# Patient Record
Sex: Female | Born: 1940 | Race: Black or African American | Hispanic: No | Smoking: Never smoker
Health system: Southern US, Community
[De-identification: ages and names within clinical notes are randomized; demographics above are authoritative.]

## PROBLEM LIST (undated history)

## (undated) DIAGNOSIS — I10 Essential (primary) hypertension: Secondary | ICD-10-CM

## (undated) DIAGNOSIS — Z950 Presence of cardiac pacemaker: Secondary | ICD-10-CM

## (undated) DIAGNOSIS — J40 Bronchitis, not specified as acute or chronic: Secondary | ICD-10-CM

## (undated) DIAGNOSIS — Z45018 Encounter for adjustment and management of other part of cardiac pacemaker: Secondary | ICD-10-CM

## (undated) DIAGNOSIS — I442 Atrioventricular block, complete: Secondary | ICD-10-CM

## (undated) HISTORY — PX: OTHER SURGICAL HISTORY: SHX169

---
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
## 1999-02-14 ENCOUNTER — Other Ambulatory Visit: Admission: RE | Admit: 1999-02-14 | Discharge: 1999-02-14 | Payer: Self-pay | Admitting: *Deleted

## 2000-04-30 ENCOUNTER — Other Ambulatory Visit: Admission: RE | Admit: 2000-04-30 | Discharge: 2000-04-30 | Payer: Self-pay | Admitting: Internal Medicine

## 2000-08-21 ENCOUNTER — Other Ambulatory Visit: Admission: RE | Admit: 2000-08-21 | Discharge: 2000-08-21 | Payer: Self-pay | Admitting: Obstetrics and Gynecology

## 2001-11-09 ENCOUNTER — Emergency Department (HOSPITAL_COMMUNITY): Admission: EM | Admit: 2001-11-09 | Discharge: 2001-11-10 | Payer: Self-pay | Admitting: Emergency Medicine

## 2002-05-04 ENCOUNTER — Encounter: Admission: RE | Admit: 2002-05-04 | Discharge: 2002-05-04 | Payer: Self-pay | Admitting: Internal Medicine

## 2002-05-04 ENCOUNTER — Encounter: Payer: Self-pay | Admitting: Internal Medicine

## 2003-06-27 ENCOUNTER — Encounter: Payer: Self-pay | Admitting: Emergency Medicine

## 2003-06-27 ENCOUNTER — Emergency Department (HOSPITAL_COMMUNITY): Admission: EM | Admit: 2003-06-27 | Discharge: 2003-06-27 | Payer: Self-pay | Admitting: Emergency Medicine

## 2004-05-02 ENCOUNTER — Encounter: Admission: RE | Admit: 2004-05-02 | Discharge: 2004-05-02 | Payer: Self-pay | Admitting: Internal Medicine

## 2005-02-14 ENCOUNTER — Encounter: Admission: RE | Admit: 2005-02-14 | Discharge: 2005-03-08 | Payer: Self-pay | Admitting: Internal Medicine

## 2006-04-11 ENCOUNTER — Emergency Department (HOSPITAL_COMMUNITY): Admission: EM | Admit: 2006-04-11 | Discharge: 2006-04-11 | Payer: Self-pay | Admitting: Emergency Medicine

## 2006-05-09 ENCOUNTER — Inpatient Hospital Stay (HOSPITAL_COMMUNITY): Admission: EM | Admit: 2006-05-09 | Discharge: 2006-05-12 | Payer: Self-pay | Admitting: Emergency Medicine

## 2006-05-09 ENCOUNTER — Ambulatory Visit: Payer: Self-pay | Admitting: Pulmonary Disease

## 2006-05-12 ENCOUNTER — Encounter: Payer: Self-pay | Admitting: Vascular Surgery

## 2006-05-23 ENCOUNTER — Ambulatory Visit: Payer: Self-pay | Admitting: Pulmonary Disease

## 2006-05-29 ENCOUNTER — Ambulatory Visit (HOSPITAL_BASED_OUTPATIENT_CLINIC_OR_DEPARTMENT_OTHER): Admission: RE | Admit: 2006-05-29 | Discharge: 2006-05-29 | Payer: Self-pay | Admitting: Pulmonary Disease

## 2006-06-18 ENCOUNTER — Ambulatory Visit (HOSPITAL_COMMUNITY): Admission: RE | Admit: 2006-06-18 | Discharge: 2006-06-18 | Payer: Self-pay | Admitting: *Deleted

## 2006-06-19 ENCOUNTER — Ambulatory Visit: Payer: Self-pay | Admitting: Pulmonary Disease

## 2006-08-18 ENCOUNTER — Emergency Department (HOSPITAL_COMMUNITY): Admission: EM | Admit: 2006-08-18 | Discharge: 2006-08-18 | Payer: Self-pay | Admitting: Pediatrics

## 2009-07-11 ENCOUNTER — Emergency Department (HOSPITAL_COMMUNITY): Admission: EM | Admit: 2009-07-11 | Discharge: 2009-07-12 | Payer: Self-pay | Admitting: Emergency Medicine

## 2010-08-29 ENCOUNTER — Encounter: Admission: RE | Admit: 2010-08-29 | Discharge: 2010-08-29 | Payer: Self-pay | Admitting: Internal Medicine

## 2010-09-07 ENCOUNTER — Encounter: Admission: RE | Admit: 2010-09-07 | Discharge: 2010-09-07 | Payer: Self-pay | Admitting: Internal Medicine

## 2011-02-08 LAB — BASIC METABOLIC PANEL
BUN: 14 mg/dL (ref 6–23)
CO2: 25 mEq/L (ref 19–32)
Chloride: 109 mEq/L (ref 96–112)
Creatinine, Ser: 0.71 mg/dL (ref 0.4–1.2)
GFR calc Af Amer: >60 mL/min (ref >60)
Potassium: 3.4 mEq/L — ABNORMAL LOW (ref 3.5–5.1)
Sodium: 142 mEq/L (ref 135–145)

## 2011-02-08 LAB — CBC
HCT: 37.9 % (ref 36.0–46.0)
Hemoglobin: 12.7 g/dL (ref 12.0–15.0)
MCHC: 33.6 g/dL (ref 30.0–36.0)
WBC: 14.9 10*3/uL — ABNORMAL HIGH (ref 4.0–10.5)

## 2011-02-08 LAB — HEPATIC FUNCTION PANEL
ALT: 10 U/L (ref 0–35)
AST: 16 U/L (ref 0–37)
Bilirubin, Direct: <0.1 mg/dL (ref 0.0–0.3)
Total Bilirubin: 0.4 mg/dL (ref 0.3–1.2)
Total Protein: 7.5 g/dL (ref 6.0–8.3)

## 2011-02-08 LAB — URINALYSIS, ROUTINE W REFLEX MICROSCOPIC
Glucose, UA: NEGATIVE mg/dL
Nitrite: POSITIVE — AB
Specific Gravity, Urine: 1.028 (ref 1.005–1.030)
pH: 5.5 (ref 5.0–8.0)

## 2011-02-08 LAB — DIFFERENTIAL
Monocytes Absolute: 0.5 10*3/uL (ref 0.1–1.0)
Monocytes Relative: 3 % (ref 3–12)
Neutro Abs: 13.4 10*3/uL — ABNORMAL HIGH (ref 1.7–7.7)

## 2011-02-08 LAB — LIPASE, BLOOD: Lipase: 18 U/L (ref 11–59)

## 2011-02-08 LAB — URINE MICROSCOPIC-ADD ON

## 2011-03-22 NOTE — Procedures (Signed)
Holly [name], JARRATT                ACCOUNT NO.:  [id number]   MEDICAL RECORD NO.:  [id number]          PATIENT TYPE:  OUT   LOCATION:  SLEEP CENTER                 FACILITY:  [facility]   PHYSICIAN:  Chesley Mires, M.D.      DATE OF BIRTH:  04-07-1941   DATE OF STUDY:                              NOCTURNAL POLYSOMNOGRAM   REFERRING PHYSICIAN:  Dr. Chesley Mires.   INDICATIONS FOR STUDY:  This is an individual who had a recent  hospitalization and  was found to have snoring and symptoms of daytime  sleepiness.  She is referred to the sleep lab for evaluation of obstructive  sleep apnea.   MEDICATIONS:  Avapro, triamterene, hydrochlorothiazide, Aciphex, amlodipine,  Delsym, and Mucinex.   EPWORTH SCORE:  20.   SLEEP ARCHITECTURE:  Sleep architecture: total recording time was 508.5 minutes, total sleep time  was 396 minutes.  Sleep efficiency was 78%.  The patient was observed in all  stages of sleep.  However, there was a significant reduction in the  percentage of slow wave sleep to 3% of study and a reduction in percentage  of REM sleep to 18% of study.  The patient was observed in both the supine  and non-supine positions.  Sleep latency was 13 minutes which is within  normal limits.  REM latency was 130.5 minutes which is within normal limits.   RESPIRATORY DATA:  This is a split night protocol.  The average respiratory  rate was 18.  During the diagnostic portion of the study the apnea/hypopnea  index was 33.9 and loud snoring was noted by the technician.  The events  were  exclusively obstructive in nature. Of note is that there was a  significant REM effect. During the therapeutic portion of study, the patient  was titrated from a C-PAP pressure reading of 6-18 cm of water.  The patient  did not sleep in the supine position during the entire  therapeutic portion  study. At a C-PAP pressure setting of 13 cm of water.  The apnea/hypopnea  index was reduced to zero. Sleep architecture  was stabilized and the patient  was observed in REM and snoring was eliminated.   OXYGEN DATA:  The baseline oxygenation was 94%. The oxygen saturation nadir  during the diagnostic portion of the study was 70%. During the therapeutic  portion of the study at a C-PAP pressure setting of 13 cm of water, the mean  oxygenation during non-REM was 94% and the mean oxygenation during REM was  92%.   CARDIAC DATA:  The average heart rate was 72 and EKG showed normal sinus  rhythm.   MOVEMENT/PARASOMNIA:  The periodic limb movement index was 11.1.  The  patient was noted to have occasional leg cramps but no other abnormal  behaviors.   IMPRESSION:  This study shows evidence for severe obstructive sleep apnea as  demonstrated by apnea/hypopnea  index of 33.9, an oxygen saturation nadir of  70%. During the therapeutic portion of the study, the patient was titrated  to a CPAP pressure of 13 cm of water and at this pressure setting the  apnea/hypopnea index was reduced  to zero and sleep architecture was  stabilized as well as oxygenation.  Additionally, the patient was observed  in REM sleep at this pressure setting.  However, she did not sleep in the  supine position at this pressure setting.  The patient was fitted with a  Respironics comfort full face mask.      Chesley Mires, M.D.  Diplomat, Tax adviser of Sleep Medicine  Electronically Signed     VS/MEDQ  D:  06/02/2006 12:25:42  T:  06/02/2006 13:39:03  Job:  [id number]

## 2011-03-22 NOTE — H&P (Signed)
Holly [name], Holly [name]                ACCOUNT NO.:  [id number]   MEDICAL RECORD NO.:  [id number]          PATIENT TYPE:  INP   LOCATION:  4708                         FACILITY:  [facility]   PHYSICIAN:  Sherryl Manges, M.D.  DATE OF BIRTH:  Jan 08, 1941   DATE OF ADMISSION:  05/09/2006  DATE OF DISCHARGE:                                HISTORY & PHYSICAL   PRIMARY CARE PHYSICIAN:  [name].   CHIEF COMPLAINT:  Increasing shortness of breath for 1 week. Cough  productive of yellow/greenish phlegm.   HISTORY OF PRESENT ILLNESS:  This is a 70 year old female. For past medical  history, see below. According to patient, who is quite a good historian,  over the past 1 week she has had increasing shortness of breath and cough  productive of initially clear phlegm, but now somewhat yellowish/greenish.  Denies fever. Denies chest pain. She had an appointment with her primary  care physician on May 05, 2006 and came to the emergency room instead, at  [facility], because of above symptoms. She was treated with  bronchodilator nebulizers and subsequently discharged home on antibiotics.  The patient does not remember which.  However after transient improvement, symptoms became much worse and patient  came back to the emergency department on May 09, 2006.   PAST MEDICAL HISTORY:  1.  Bronchial asthma.  2.  Hypertension.  3.  Osteoarthritis.  4.  GERD.   MEDICATIONS:  1.  Amlodipine 10 mg p.o. daily.  2.  Avapro 300 mg p.o. daily.  3.  Triamterene/HCTZ 37.5/25 one p.o. daily.  4.  Aciphex 20 mg p.o. b.i.d.  5.  Albuterol MDI p.r.n.  6.  Albuterol/Atrovent nebulizers p.r.n.   ALLERGIES:  No known drug allergies, however patient is reportedly  intolerant of all other proton pump inhibitors.   REVIEW OF SYSTEMS:  Essentially as in the HPI and chief complaint, otherwise  negative.   SOCIAL HISTORY:  The patient is divorced for about 20 years now. Has 7  offspring who are alive  and well. Has one brother and one sister both alive  and well. Mother died at age 27 years status post CVA.  Father's health  history is unknown.  The patient is a nonsmoker, non drinker, no history of  drug abuse.   PHYSICAL EXAMINATION:  VITAL SIGNS:  Temperature 99.1, pulse 90 per minute,  regular. Respiratory rate 24. Blood pressure 140/78 mmHg. Pulse oximetry 95%  on room air.  GENERAL:  The patient appears quite comfortable, communicative, not short of  breath at rest, talking in complete sentences.  HEENT:  No clinical pallor, no jaundice, no conjunctival injection.  NECK:  Supple, JVP not seen.  RESPIRATORY:  Accessory muscles not in use.  CHEST:  Bilateral expiratory wheeze, no crackles.  CARDIAC:  Heart sounds were heard, normal, regular, no murmurs.  ABDOMEN:  Morbidly obese. Soft and nontender, no hepatosplenomegaly, no  masses. Normal bowel sounds.  LOWER EXTREMITY EXAMINATION:  No pitting edema. Palpable peripheral pulses.  MUSCULOSKELETAL:  Examination appears quite unremarkable.  CENTRAL NERVOUS SYSTEM:  No focal neurologic deficits, gross  examination.   INVESTIGATIONS:  Hemoglobin 13.6, hematocrit 40.0. Electrolytes:  Sodium  140, potassium 3.7, chloride 105, CO2 26.9, BUN 21, creatinine 1.1, glucose  112.  Troponin I at point of care less than 0.05.  Chest x-ray dated May 09, 2006  is a low volume film but shows chronic interstitial coarsening without focal  infiltrate or edema.  EKG dated May 09, 2006 shows sinus rhythm, regular, 95  per minute. Normal axis. No acute ischemic changes.   ASSESSMENT:  1.  Infective exacerbation of bronchial asthma.  Plan to admit the patient,      administer bronchodilator nebulizers, oxygen supplementation, steroids      and cover with Avelox.   1.  Hypertension.  This appears uncontrolled. Patient is on multiple      antihypertensive medications. We shall reinstate these, monitor and      adjust medications as indicated.   1.   GERD.  The patient is on twice daily proton pump inhibitor, which we      will continue. Reportedly patient is intolerant of all PPIs except      Aciphex. Therefore, we shall continue this for now.   1.  History of osteoarthritis. Utilize p.r.n. analgesics.   Further management will depend on clinical course.   NOTE:  The patient's daughter is an R. N. and has insisted that she would  like some pulmonary involvement in this patient's case. According to her,  patient has had increasingly frequent exacerbation of her bronchial asthma.  I have assured that we will consult pulmonary accordingly, on May 10, 2006.      Sherryl Manges, M.D.  Electronically Signed     CO/MEDQ  D:  05/09/2006  T:  05/09/2006  Job:  [id number]   cc:   Merrilee Seashore, M.D.  Fax: [phone]

## 2011-03-22 NOTE — Assessment & Plan Note (Signed)
[place]                               PULMONARY OFFICE NOTE   NAME:Holly [name], Holly [name]                       MRN:          [id number]  DATE:05/23/2006                            DOB:          [birth year]    I saw Ms. Minotti in followup today for her cough, post nasal drip, reflux,  vocal cord dysfunction, obesity, reactive airway disease with acute  bronchitis and possible sleep apnea.  With regards to her cough she said  this is improved significantly and she is only using Mucinex and Delsym on  an as needed basis but has not had the need to use this for the last several  days.  With regards to her post nasal drip, she is only using her nasal  steroid inhaler on an as needed basis and again has not had to use this  recently.  With regards to her reflux, she is due to have a followup  evaluation with Dr. Lajoyce Corners on July 30th.  She is currently on AcipHex 20 mg  b.i.d.  With regards to vocal cord dysfunction, with control of the above,  this is improved considerably and then with regards to her acute bronchitis  with possible reactive airway disease, she is not using her albuterol  inhaler.  She is off of prednisone and has not noticed any worsening of her  symptoms.  With regards to her obesity and sleep apnea, her symptoms are  still quite the same with regards to snoring at night as well as feeling  tired during the day.   CURRENT MEDICATIONS:  1.  Avapro 300 mg q. day.  2.  Triamterene/hydrochlorothiazide 37.5/25  1 pill q. day.  3.  AcipHex 20 mg b.i.d.  4.  Amlodipine 10 mg q. day.  5.  Delsym p.r.n.  6.  Mucinex p.r.n.   PHYSICAL EXAMINATION:  VITAL SIGNS:  She is 228 pounds.  Temperature is  98.7.  Blood pressure 140/80.  Heart rate is 98.  Oxygen saturation is 95%  on room air.  HEENT:  There are no sinus tenderness, no nasal discharge.  She had a  Mallampati 4 airway with elongated above the uvula and 2+ tonsils.  No  lymphadenopathy.  No  thyromegaly.  HEART:  S1, S2 with a regular rate and rhythm.  CHEST:  Clear to auscultation.  ABDOMEN:  Obese, soft, nontender.  EXTREMITIES:  No edema/clubbing.  NEUROLOGIC:  She is alert and oriented x3.  5/5 strength.  No cerebellar  deficits were appreciated.   IMPRESSION:  1.  Chronic cough.  This seems to have improved considerably. At this point      I just recommend as needed treatment for cough suppression.  2.  Post nasal drip.  This seems to have improved considerably as well.  She      could use nasal irrigation and steroid nasal inhalers on as needed      basis.  3.  Reflux.  She is due to have followup with Dr. Lajoyce Corners later this month.  4.  Symptoms suggestive of sleep apnea.  At  this point I feel that it would      be warranted for her to undergo an overnight polysomnogram to further      assess this - particularly in light of the fact that she has difficult      to control hypertension.  Therefore we will make arrangements for her to      have an overnight polysomnogram with a split night protocol and then I      will followup with her after reviewing the sleep study.  5.  Obesity.  I again discussed with her the importance of diet, exercise      and weight reduction.  6.  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  With the improvement in the above this      seems to have improved as well.  7.  Acute bronchitis with possible reactive airway disease.  She did not      seem to have any difficulty with regards to this at the present time.      Therefore I have advised her to use her albuterol on an as needed basis      but did not feel that she would need any additional forms of therapy for      this at this time.                                   Chesley Mires, MD   VS/MedQ  DD:  05/23/2006  DT:  05/23/2006  Job #:  [id number]   cc:   Waverly Ferrari, MD  Merrilee Seashore, MD

## 2011-03-22 NOTE — Op Note (Signed)
[name], [name]                ACCOUNT NO.:  [id number]   MEDICAL RECORD NO.:  [id number]          PATIENT TYPE:  AMB   LOCATION:  ENDO                         FACILITY:  [place]   PHYSICIAN:  Waverly Ferrari, M.D.    DATE OF BIRTH:  12-05-1940   DATE OF PROCEDURE:  06/18/2006  DATE OF DISCHARGE:                                 OPERATIVE REPORT   PROCEDURE:  Colonoscopy.   ENDOSCOPIST:  Waverly Ferrari, M.D.   INDICATIONS:  Colon cancer screening.   ANESTHESIA:  Demerol 20 mg, Versed.   PROCEDURE:  With the patient mildly sedated in the left lateral decubitus  position, the Olympus videoscopic colonoscope was inserted into the rectum  and passed under direct vision to the cecum, identified by ileocecal valve  and appendiceal orifice, both of which were photographed.  From this point,  the colonoscope was slowly withdrawn, taking circumferential views of the  colonic mucosa, stopping in the rectum, which appeared normal on direct and  showed hemorrhoids on retroflexed view.  The endoscope was straightened and  withdrawn.  The patient's vital signs and pulse oximetry remained stable.  The patient tolerated procedure well without apparent complications.   FINDINGS:  Internal hemorrhoids, otherwise an unremarkable colonoscopic  examination to the cecum.   PLAN:  Consider repeat examination in 5-10 years           ______________________________  Waverly Ferrari, M.D.     GMO/MEDQ  D:  06/18/2006  T:  06/18/2006  Job:  [id number]

## 2011-03-22 NOTE — Consult Note (Signed)
Holly [name], TRIMNAL               ACCOUNT NO.:  [id number]   MEDICAL RECORD NO.:  [id number]            PATIENT TYPE:   LOCATION:                                 FACILITY:   PHYSICIAN:  Chesley Mires, M.D.      DATE OF BIRTH:  [birth year]   DATE OF CONSULTATION:  05/11/2006  DATE OF DISCHARGE:                                   CONSULTATION   PULMONARY CONSULTATION   REASON FOR CONSULTATION:  Evaluation of wheezing.  Patient is a 70 year old  female who was admitted on July6 with an apparent exacerbation of her  asthma.  She said she was diagnosed with asthma approximately 3 years ago.  What had happened was around that time, she had lost approximately 50  pounds; and then subsequently regained the weight.  After this she started  developing her symptoms.  Initially she started having symptoms of acid  reflux; and then later developed symptoms of a globus sensation in her  throat associated with throat irritation and coughing.  She would also get  occasional wheezing, which she said was more in her upper throat; and she  did not really have any symptoms as far as chest tightness or dyspnea  associated wheezing.  She does state; however, that she remembers having a  cold approximately 2-3 years ago; and after that she would get symptoms of  wheezing, coughing with sputum production associated with upper airway  infections.  Otherwise in between she does not have these symptoms.  She  says that she also has symptoms of nasal congestion with postnasal drip; and  this causes some irritation in her throat as well.  She says that if she is  exposed to any kind of the irritants, such as car exhaust, cigarette smoke,  or someone mowing the lawn that these tends to bring out her symptoms of the  nasal congestion; and she also gets eye irritation occasionally as well with  this.  She has also been told that she snores at night; and her son has told  her that he has actually seen her stop  breathing while she is asleep.  She  complains of symptoms of feeling sleepy during the day; and has said that  she has actually had instances where she has almost fallen asleep while  driving.  She will also oftentimes eat late in the night prior to going to  sleep.  She says with this most recent event she had developed symptoms of  coughing, wheezing, and sputum production which was yellowish in color.  Since her hospitalization she has improved.  She says that when she uses her  inhalers, that these help occasionally, but oftentimes do not provide any  relief at all.   PAST MEDICAL HISTORY:  Otherwise is significant for asthma, hypertension,  arthritis and reflux.   ALLERGIES:  She has no known drug allergies.   OUTPATIENT MEDICATIONS:  1.  Norvasc.  2.  Avapro.  3.  Maxzide.  4.  AcipHex.  5.  Albuterol.  6.  DuoNeb.   CURRENT INPATIENT MEDICATIONS:  1.  Advair.  2.  Atrovent.  3.  Avapro.  4.  Lovenox.  5.  Maxzide.  6.  Mucinex.  7.  AcipHex.  8.  Norvasc.  9.  Rocephin.  10. Singulair.  11. Solu-Medrol.  12. Xopenex.  13. Zithromax.   SOCIAL HISTORY:  The patient is divorced.  She has 7 children.  She does not  have any personal history of tobacco use, but she did have secondhand  tobacco exposure.  There is no history of significant alcohol use.   FAMILY HISTORY:  Her mother died from a stroke.   REVIEW OF SYSTEMS:  Essentially negative except for as stated above.   PHYSICAL EXAM:  GENERAL:  She is seen in her hospital room sitting at her  bedside.  She is awake and alert, does not appear to be in acute distress.  VITAL SIGNS:  Her blood pressure 96/60, heart rate 71, respiratory rate 20,  temperature 98.1.  oxygen saturation 96 on room air.  She is 6 feet 2 inches  tall, 235 pounds.  HEENT AND NECK:  Pupils reactive.  Extraocular muscles are intact.  There is  no sinus tenderness.  She has clear nasal discharge with prominent nasal  turbinates.  She has  a Mallampati IV airway with oropharyngeal crowding and  elongated uvula.  There is no lymphadenopathy no  thyromegaly.  She did have  stridor heard over her upper airway which improved with pursed-lip  breathing.  HEART:  With S1-S2 no murmur.  CHEST:  She had audible wheezing, which, again, improved with purse-lipped  breathing.  There are no rales; and she had good air entry bilaterally.  ABDOMEN:  Obese, soft, nontender.  Positive bowel sounds.  EXTREMITIES:  There was no edema, cyanosis, or clubbing.  NEUROLOGIC EXAM:  She is alert and oriented x3, 5/5 strength, and no  cerebellar deficits were appreciated.   LABORATORY VALUES:  WBC is 7.1, hemoglobin 12, hematocrit 35.8, platelet  count is 296.  Sodium 141, potassium 4.5, chloride 107 CO2 26, BUN 24,  creatinine 1, glucose 151, calcium is 10.1.  Chest x-ray from 05/09/2006 was  essentially normal.  EKG from 05/09/2006 showed a normal sinus rhythm.   IMPRESSION:  She has symptoms of chronic cyclic cough, postnasal drip,  reflux, possible reactive airway disease with an acute bronchitis, obesity,  vocal cord dysfunction and symptoms suggestive of sleep apnea.   PLAN:  1.  With regards to the postnasal drip, I would continue her on her nasal      steroid regimen as well as needed nasal saline sprays and nasal      irrigation.  2.  Cyclic cough.  She could be tried on a course of cough suppressant such      as delsym  for 4-7 days.  Additionally, I have advised her to try using      sugarless candy to maintain the moisture in her mouth.  3.  Symptoms of reflux.  I would continue her on her proton pump inhibitor      regimen.  I have discussed with her the importance of dietary      modification; as well as having the head of her bed elevated while she      is asleep at night.  4.  Symptoms suggestive of sleep apnea.  This may be contributing to her     reflux symptoms, particularly at night as well.  For this she would need      to  have further evaluation, on outpatient basis with the overnight sleep      study.  5.  Obesity.  I have advised her about the importance of diet, exercise and      weight reduction, and it appears that many of her symptoms coincide with      an increase in her weight.  6.  Vocal cord dysfunction which is likely being caused by numbers 1, 2, 3,      and 4; and as these are improved, hopefully her symptoms of vocal cord      dysfunction will improve as well.  7.  Reactive airway disease with an acute bronchitis.  She seems to be      better symptomatically.  I would discontinue her Advair as this may, in      fact, be contributing to some of her symptoms of airway irritation.  I      would also wean her off of the Solu-Medrol as she does not appear to be      having bronchus spasm, but rather her wheezing is mostly related to her      upper airway.  I will also change her to oral antibiotics; and change      her inhaler regimen so that she would get this on an as needed basis;      and then I would make arrangements for her to follow-up with me in the      office within about a week or so after she is discharged from the      hospital.      Chesley Mires, M.D.  Electronically Signed     VS/MEDQ  D:  05/11/2006  T:  05/11/2006  Job:  [id number]

## 2011-03-22 NOTE — Discharge Summary (Signed)
Holly [name], Holly [name]                ACCOUNT NO.:  [id number]   MEDICAL RECORD NO.:  [id number]          PATIENT TYPE:  INP   LOCATION:  20                         FACILITY:  [facility]   PHYSICIAN:  Holly B. Bakare, M.D.DATE OF BIRTH:  Jan 17, 1941   DATE OF ADMISSION:  05/09/2006  DATE OF DISCHARGE:  05/12/2006                                 DISCHARGE SUMMARY   PRIMARY CARE PHYSICIAN:  Merrilee Seashore, M.D.   FINAL DIAGNOSES:  1.  Reactive airway disease with acute bronchitis.  2.  Postnasal drip.  3.  Vocal cord dysfunction.  4.  Probable sleep apnea.  5.  Gastroesophageal reflux disease.  6.  Probable cyclic cough.   CONSULTS:  Pulmonary consult, Dr. Halford Chessman.   PROCEDURES:  Chest x-ray done on the 6th of July showed no acute findings,  chronic interstitial coarsening without focal infiltrate or edema.   BRIEF HISTORY:  In brief, Holly [name] is a 70 year old African-American  female with past medical history of wound care, asthma and GERD.  She  presented to the emergency room with increased shortness of breath for 1  week and cough productive of sputum.  Sputum was yellowish/greenish.  There  was no accompanying fever.  On initial evaluation in the emergency room  temperature was low grade at 99.1, pulse rate 90.  She was saturating at 95%  on room air.   PHYSICAL EXAMINATION:  Significant for bilateral expiratory wheeze, no  crackles.  Lower extremities showed right leg slightly swollen.  Otherwise  rest of physical examination was unremarkable.   HOSPITAL COURSE:  Reactive airway disease with active bronchitis.  The  patient was started on nebulizer, systemic steroids.  She does have a  history of bronchial asthma.  She was thought to have acute exacerbation of  bronchial asthma.  Despite treatment combination with antibiotic,  bronchodilators, IV steroids and inhaled Advair, PPIs, the patient was not  making significant progress, hence pulmonary consult was obtained.   She was  evaluated by Dr. Halford Chessman.  The patient was determined to have reactive airway  disease with acute bronchitis, vocal cord dysfunction, postnasal drip,  cyclic cough, gastroesophageal reflux disease, and probable sleep apnea.  She is also obese.   She was continued on around-the-clock nebulization and she has been weaned  off steroids.   The patient felt much better from the time of admission.  She still has some  mild rhonchi bibasilarly; however, the patient is not dyspneic and she feels  comfortable and she has requested discharge today.   Lower extremity Dopplers were done to rule out DVT in view of the slightly  swollen right leg. Lower extremity Dopplers showed no evidence of DVT,  superficial thrombosis or Baker's cyst.   Recommendation by pulmonologist is to continue nasal steroid and use nasal  irrigation to help with the postnasal drip, Delsym was recommended as cough  suppressant.  She was given advice on dietary modifications and weight loss  reduction.   DISCHARGE MEDICATIONS:  1.  AcipHex 20 mg b.i.d.  2.  Avapro 300 mg daily.  3.  Triamterene/hydrochlorothiazide 37.5/25 daily.  4.  Amlodipine 10 mg daily.  5.  Nasonex nasal spray daily.  6.  Albuterol 2 puffs 4 times a day and p.r.n. until the patient is stable      respiratory wise, then she can continue with previous dosage of 2 puffs      b.i.d.  7.  Continue with medication of Ceftin 250 mg 2 times a day to complete      treatment for 3 more days.  8.  Delsym 30 mg 2 times a day for 5 more days.  9.  Prednisone 10 mg 2 daily for 3 days then 1 daily for 3 days.   RECOMMENDATIONS:  1.  Follow up with Dr. Chesley Mires for polysomnography.  2.  Consider outpatient GI evaluation for pH probe.   DISCHARGE LABS:  Sodium 141, potassium 4.5, chloride 107, bicarb 26, BUN 24,  creatinine 1, glucose 151, calcium 10.1.      Holly [name], M.D.  Electronically Signed     MBB/MEDQ  D:  05/12/2006  T:   05/13/2006  Job:  [id number]   cc:   Merrilee Seashore, M.D.  Fax: [phone]   Chesley Mires, M.D.

## 2011-03-22 NOTE — Op Note (Signed)
[name], [name]                ACCOUNT NO.:  [id number]   MEDICAL RECORD NO.:  [id number]          PATIENT TYPE:  AMB   LOCATION:  ENDO                         FACILITY:  [facility]   PHYSICIAN:  Waverly Ferrari, M.D.    DATE OF BIRTH:  [birth year]   DATE OF PROCEDURE:  DATE OF DISCHARGE:                                 OPERATIVE REPORT   PROCEDURE:  Upper endoscopy.   INDICATIONS:  Gastroesophageal reflux disease.   ANESTHESIA:  Demerol 50 mg, Versed 5 mg.   PROCEDURE IN DETAIL:  With patient mildly sedated in the left lateral  decubitus position, the Olympus videoscopic endoscope was inserted into the  mouth, passed under direct vision through the esophagus which appeared  normal.  There was no evidence of Barrett esophagus.  We entered into the  stomach.  Fundus, body, antrum, duodenal bulb, second portion of the  duodenum appeared normal.  From this point, the endoscope was slowly  withdrawn taking circumferential views of the duodenal mucosal until the  endoscope was then pulled back into the stomach, placed in retroflexion and  viewed the stomach from below.  The endoscope was straightened and  withdrawn, taking circumferential views of the remaining gastric and  esophageal mucosa.  The patient's vital signs, pulse oximeter, remained  stable.  Patient tolerated the procedure well without apparent  complications.   FINDINGS:  Rather unremarkable examination.   PLAN:  Proceed to colonoscopy.           ______________________________  Waverly Ferrari, M.D.     GMO/MEDQ  D:  06/18/2006  T:  06/18/2006  Job:  [id number]

## 2011-03-22 NOTE — Assessment & Plan Note (Signed)
[facility]                               PULMONARY OFFICE NOTE   NAME:Stonerock, MELONY FRANCESCHI                       MRN:          [id number]  DATE:06/19/2006                            DOB:          17-Jun-1941    I saw Ms. Mcneary today after she had undergone her split-night study on  May 29, 2006.  During the diagnostic portion of the study it was found that  she has severe sleep apnea with apnea property index of 33.9 an oxygen  saturation of 70% with a significant REM effect.  During the therapeutic  portion of the study she is titrated to a CPAP pressure setting of 13 cm of  water with a reduction in her apnea property index to 0.  She was observed  in REM sleep at this pressure setting however, she did not have supine  sleep.  She said that she was able to tolerate the CPAP procedure fairly  well and she had no difficulty adjusting to the mask.  At this time what I  will do is initiate her on CPAP with 13 cm of water with heated  humidification and mask of choice and then I plan on following up with her  in approximately 6-8 weeks to assess the tolerance, compliance of CPAP  therapy.  I have also emphasized with her the importance of diet, exercise,  weight reduction.  She has also had evaluation by Lajoyce Corners for a possible reflux  disease.  She is still having some symptoms of her intermittent cough, but  hopefully with the improvement in her sleep apnea this will help her improve  this as well.  I will reassess her symptoms at her next followup to  determine if any further intervention would need to be done for this.                                   Chesley Mires, MD   VS/MedQ  DD:  06/19/2006  DT:  06/19/2006  Job #:  [id number]

## 2013-01-06 ENCOUNTER — Other Ambulatory Visit: Payer: Self-pay | Admitting: Internal Medicine

## 2013-01-06 DIAGNOSIS — R0989 Other specified symptoms and signs involving the circulatory and respiratory systems: Secondary | ICD-10-CM

## 2013-01-25 ENCOUNTER — Ambulatory Visit
Admission: RE | Admit: 2013-01-25 | Discharge: 2013-01-25 | Disposition: A | Discharge status: Home / Self Care | Payer: Medicare Other | Source: Ambulatory Visit | Attending: Internal Medicine | Admitting: Internal Medicine

## 2013-01-25 DIAGNOSIS — R0989 Other specified symptoms and signs involving the circulatory and respiratory systems: Secondary | ICD-10-CM

## 2013-08-09 ENCOUNTER — Ambulatory Visit (INDEPENDENT_AMBULATORY_CARE_PROVIDER_SITE_OTHER): Payer: Self-pay

## 2013-08-09 ENCOUNTER — Ambulatory Visit (INDEPENDENT_AMBULATORY_CARE_PROVIDER_SITE_OTHER): Payer: Medicare Other | Admitting: Neurology

## 2013-08-09 DIAGNOSIS — G5620 Lesion of ulnar nerve, unspecified upper limb: Secondary | ICD-10-CM

## 2013-08-09 DIAGNOSIS — G5621 Lesion of ulnar nerve, right upper limb: Secondary | ICD-10-CM

## 2013-08-09 DIAGNOSIS — G609 Hereditary and idiopathic neuropathy, unspecified: Secondary | ICD-10-CM

## 2013-08-09 DIAGNOSIS — G5602 Carpal tunnel syndrome, left upper limb: Secondary | ICD-10-CM

## 2013-08-09 DIAGNOSIS — Z0289 Encounter for other administrative examinations: Secondary | ICD-10-CM

## 2013-08-09 DIAGNOSIS — G63 Polyneuropathy in diseases classified elsewhere: Secondary | ICD-10-CM

## 2013-08-09 DIAGNOSIS — G5600 Carpal tunnel syndrome, unspecified upper limb: Secondary | ICD-10-CM

## 2013-08-09 NOTE — Procedures (Signed)
    [facility]  NCS (NERVE CONDUCTION STUDY) WITH EMG (ELECTROMYOGRAPHY) REPORT   STUDY DATE:  08/09/2013 PATIENT NAME: Holly [name] DOB: 22-Jun-1941 MRN: [id number]    TECHNOLOGIST: Laretta Alstrom ELECTROMYOGRAPHER: Marcial Pacas M.D.  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 Since March 2014, she noticed right hand muscle atrophy, weakness, right elbow pain, radiating pain to her right ulnar forearm involving right 4th, 5th fingers.   On exam: She has right hand intrinsic muscle atrophy, mild weakness of right hand abduction, finger flexion. She also has mild left abductor pollicis brevis weakness. Mild length dependent sensory changes    FINDINGS: NERVE CONDUCTION STUDY: Bilateral peroneal sensory responses were absent.  Left peroneal, and tibial motor responses were absent    Right ulnar sensory response was absent. Right ulnar motor response showed severely decreased C. map amplitude, slower conduction velocity.  Left ulnar sensory and motor responses were normal.   Left median sensory response was absent. Left median motor response showed severely prolonged distal latency, severely decreased C. map amplitude.  Right median sensory response showed moderately prolonged peak latency, with normal snap amplitude.   right median motor response showed moderate to severely prolonged distal latency, with normal amplitude, mildly prolonged F-wave latency.   NEEDLE ELECTROMYOGRAPHY: Selected needle examination was performed at bilateral upper, lower extremity muscles, left lumbosacral paraspinal muscles .  Left abductor pollicis brevis, increased insertion activity, no spontaneous activity, mildly enlarged motor unit potential, with decreased recruitment patterns .  Right first dorsal interossei, abductor digital minini: Increased  insertion activity, 1-2 plus spontaneous activity, mildly enlarged motor unit potential, with mildly decreased recruitment patterns.  Right flexor carpi ulnaris:  Normally insertion activity, no spontaneous activity, mildly enlarged motor unit potential, with normal recruitment patterns.  Needle examination of right extensor digitorum communis, pronator teres, biceps, triceps, deltoid was normal.  Needle examination of bilateral tibialis anterior, tibialis posterior, medial gastrocnemius, vastus lateralis was normal. There was no spontaneous activity at left lumbosacral paraspinal muscles, left L4, L5, S1.    IMPRESSION:  This is an abnormal study. 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  There is also evidence of left carpal tunnel syndromes.       INTERPRETING PHYSICIAN:   Marcial Pacas M.D. Ph.D. [facility] [address], [place], [place] [address] [phone]

## 2014-04-16 ENCOUNTER — Encounter (HOSPITAL_COMMUNITY): Payer: Self-pay | Admitting: Emergency Medicine

## 2014-04-16 ENCOUNTER — Emergency Department (HOSPITAL_COMMUNITY)
Admission: EM | Admit: 2014-04-16 | Discharge: 2014-04-16 | Disposition: A | Discharge status: Home / Self Care | Payer: Medicare Other | Attending: Emergency Medicine | Admitting: Emergency Medicine

## 2014-04-16 DIAGNOSIS — M7989 Other specified soft tissue disorders: Secondary | ICD-10-CM

## 2014-04-16 DIAGNOSIS — R609 Edema, unspecified: Secondary | ICD-10-CM | POA: Insufficient documentation

## 2014-04-16 DIAGNOSIS — I10 Essential (primary) hypertension: Secondary | ICD-10-CM | POA: Insufficient documentation

## 2014-04-16 DIAGNOSIS — R600 Localized edema: Secondary | ICD-10-CM

## 2014-04-16 DIAGNOSIS — M79609 Pain in unspecified limb: Secondary | ICD-10-CM

## 2014-04-16 DIAGNOSIS — Z79899 Other long term (current) drug therapy: Secondary | ICD-10-CM | POA: Insufficient documentation

## 2014-04-16 DIAGNOSIS — Z8709 Personal history of other diseases of the respiratory system: Secondary | ICD-10-CM | POA: Insufficient documentation

## 2014-04-16 HISTORY — DX: Bronchitis, not specified as acute or chronic: J40

## 2014-04-16 HISTORY — DX: Essential (primary) hypertension: I10

## 2014-04-16 NOTE — Progress Notes (Signed)
VASCULAR LAB PRELIMINARY  PRELIMINARY  PRELIMINARY  PRELIMINARY  Left lower extremity venous duplex completed.    Preliminary report:  Left:  No evidence of DVT, superficial thrombosis, or Baker's cyst.  Bevan Vu, RVS 04/16/2014, 1:02 PM

## 2014-04-16 NOTE — ED Provider Notes (Signed)
CSN: [id number]     Arrival date & time 04/16/14  1047 History   First MD Initiated Contact with Patient 04/16/14 1104     Chief Complaint  Patient presents with  . Leg Swelling     (Consider location/radiation/quality/duration/timing/severity/associated sxs/prior Treatment) The history is provided by the patient and a relative.   Issue here with two-day history of left lower extremity swelling. No associated chest pain or shortness of breath. Denies any recent history of trauma. Symptoms persisted and no prior history of same. No prior history of DVT. Did have a recent episode of having prolonged sitting. Swelling starts from her left ankle and goes up to her mid thigh. Denies any hip pain with this. Nothing makes her symptoms better worse and no treatment used prior to arrival Past Medical History  Diagnosis Date  . Hypertension   . Bronchitis    Past Surgical History  Procedure Laterality Date  . Left leg surgery     No family history on file. History  Substance Use Topics  . Smoking status: Never Smoker   . Smokeless tobacco: Never Used  . Alcohol Use: No   OB History   Grav Para Term Preterm Abortions TAB SAB Ect Mult Living                 Review of Systems  All other systems reviewed and are negative.     Allergies  Review of patient's allergies indicates no known allergies.  Home Medications   Prior to Admission medications   Medication Sig Start Date End Date Taking? Authorizing Provider  amLODipine (NORVASC) 10 MG tablet Take 10 mg by mouth daily.  04/02/14  Yes Historical Provider, MD  atorvastatin (LIPITOR) 40 MG tablet Take 40 mg by mouth daily.  03/29/14  Yes Historical Provider, MD  ibuprofen (ADVIL,MOTRIN) 600 MG tablet Take 600 mg by mouth every 8 (eight) hours as needed for mild pain.  02/03/14  Yes Historical Provider, MD  irbesartan (AVAPRO) 300 MG tablet Take 300 mg by mouth daily.  04/02/14  Yes Historical Provider, MD  RABEprazole (ACIPHEX) 20 MG  tablet Take 20 mg by mouth daily.  04/04/14  Yes Historical Provider, MD  venlafaxine XR (EFFEXOR-XR) 37.5 MG 24 hr capsule Take 37.5 mg by mouth daily with breakfast.  04/05/14  Yes Historical Provider, MD   BP 165/71  Pulse 80  Temp(Src) 97.8 F (36.6 C) (Oral)  Resp 18  SpO2 97% Physical Exam  Nursing note and vitals reviewed. Constitutional: She is oriented to person, place, and time. She appears well-developed and well-nourished.  Non-toxic appearance. No distress.  HENT:  Head: Normocephalic and atraumatic.  Eyes: Conjunctivae, EOM and lids are normal. Pupils are equal, round, and reactive to light.  Neck: Normal range of motion. Neck supple. No tracheal deviation present. No mass present.  Cardiovascular: Normal rate, regular rhythm and normal heart sounds.  Exam reveals no gallop.   No murmur heard. Pulmonary/Chest: Effort normal and breath sounds normal. No stridor. No respiratory distress. She has no decreased breath sounds. She has no wheezes. She has no rhonchi. She has no rales.  Abdominal: Soft. Normal appearance and bowel sounds are normal. She exhibits no distension. There is no tenderness. There is no rebound and no CVA tenderness.  Musculoskeletal: Normal range of motion. She exhibits no edema and no tenderness.  Swelling to left lower extremity without palpable cords. Dorsalis pedis pulse palpable. Neurovascular intact  Neurological: She is alert and oriented to person, place,  and time. She has normal strength. No cranial nerve deficit or sensory deficit. GCS eye subscore is 4. GCS verbal subscore is 5. GCS motor subscore is 6.  Skin: Skin is warm and dry. No abrasion and no rash noted.  Psychiatric: She has a normal mood and affect. Her speech is normal and behavior is normal.    ED Course  Procedures (including critical care time) Labs Review Labs Reviewed - No data to display  Imaging Review No results found.   EKG Interpretation None      MDM   Final  diagnoses:  None    Lower extremity Doppler negative for DVT. Stable for discharge    Leota Jacobsen, MD 04/16/14 1330

## 2014-04-16 NOTE — ED Notes (Signed)
Pt c/o left lower leg with swelling with some pain on inner lateral side close to left ankle. Pt states that she is able to bear weight. Pt's family member is worried about DVT.

## 2014-04-16 NOTE — ED Notes (Signed)
Bed: WA19 Expected date:  Expected time:  Means of arrival:  Comments: 

## 2014-04-16 NOTE — Discharge Instructions (Signed)

## 2015-01-06 ENCOUNTER — Ambulatory Visit: Payer: Medicare Other | Attending: Internal Medicine | Admitting: Physical Therapy

## 2015-11-22 DIAGNOSIS — J069 Acute upper respiratory infection, unspecified: Secondary | ICD-10-CM | POA: Diagnosis not present

## 2016-01-24 DIAGNOSIS — I10 Essential (primary) hypertension: Secondary | ICD-10-CM | POA: Diagnosis not present

## 2016-01-24 DIAGNOSIS — G609 Hereditary and idiopathic neuropathy, unspecified: Secondary | ICD-10-CM | POA: Diagnosis not present

## 2016-01-24 DIAGNOSIS — E782 Mixed hyperlipidemia: Secondary | ICD-10-CM | POA: Diagnosis not present

## 2016-01-24 DIAGNOSIS — K219 Gastro-esophageal reflux disease without esophagitis: Secondary | ICD-10-CM | POA: Diagnosis not present

## 2016-01-26 DIAGNOSIS — H40023 Open angle with borderline findings, high risk, bilateral: Secondary | ICD-10-CM | POA: Diagnosis not present

## 2016-01-26 DIAGNOSIS — H1013 Acute atopic conjunctivitis, bilateral: Secondary | ICD-10-CM | POA: Diagnosis not present

## 2016-01-26 DIAGNOSIS — H04123 Dry eye syndrome of bilateral lacrimal glands: Secondary | ICD-10-CM | POA: Diagnosis not present

## 2016-01-26 DIAGNOSIS — H1859 Other hereditary corneal dystrophies: Secondary | ICD-10-CM | POA: Diagnosis not present

## 2016-01-31 DIAGNOSIS — G609 Hereditary and idiopathic neuropathy, unspecified: Secondary | ICD-10-CM | POA: Diagnosis not present

## 2016-01-31 DIAGNOSIS — M25551 Pain in right hip: Secondary | ICD-10-CM | POA: Diagnosis not present

## 2016-01-31 DIAGNOSIS — I10 Essential (primary) hypertension: Secondary | ICD-10-CM | POA: Diagnosis not present

## 2016-01-31 DIAGNOSIS — E782 Mixed hyperlipidemia: Secondary | ICD-10-CM | POA: Diagnosis not present

## 2016-02-08 DIAGNOSIS — M25511 Pain in right shoulder: Secondary | ICD-10-CM | POA: Diagnosis not present

## 2016-02-08 DIAGNOSIS — M25551 Pain in right hip: Secondary | ICD-10-CM | POA: Diagnosis not present

## 2016-02-08 DIAGNOSIS — M542 Cervicalgia: Secondary | ICD-10-CM | POA: Diagnosis not present

## 2016-02-08 DIAGNOSIS — M545 Low back pain: Secondary | ICD-10-CM | POA: Diagnosis not present

## 2016-02-08 DIAGNOSIS — M25512 Pain in left shoulder: Secondary | ICD-10-CM | POA: Diagnosis not present

## 2016-02-26 DIAGNOSIS — M25551 Pain in right hip: Secondary | ICD-10-CM | POA: Diagnosis not present

## 2016-03-20 DIAGNOSIS — M25512 Pain in left shoulder: Secondary | ICD-10-CM | POA: Diagnosis not present

## 2016-03-20 DIAGNOSIS — M25511 Pain in right shoulder: Secondary | ICD-10-CM | POA: Diagnosis not present

## 2016-03-20 DIAGNOSIS — M25551 Pain in right hip: Secondary | ICD-10-CM | POA: Diagnosis not present

## 2016-04-24 DIAGNOSIS — M25551 Pain in right hip: Secondary | ICD-10-CM | POA: Diagnosis not present

## 2016-05-29 DIAGNOSIS — J069 Acute upper respiratory infection, unspecified: Secondary | ICD-10-CM | POA: Diagnosis not present

## 2016-06-05 DIAGNOSIS — M150 Primary generalized (osteo)arthritis: Secondary | ICD-10-CM | POA: Diagnosis not present

## 2016-06-05 DIAGNOSIS — M25551 Pain in right hip: Secondary | ICD-10-CM | POA: Diagnosis not present

## 2016-06-05 DIAGNOSIS — G609 Hereditary and idiopathic neuropathy, unspecified: Secondary | ICD-10-CM | POA: Diagnosis not present

## 2016-08-07 ENCOUNTER — Encounter (HOSPITAL_COMMUNITY): Payer: Self-pay | Admitting: Emergency Medicine

## 2016-08-07 ENCOUNTER — Ambulatory Visit (HOSPITAL_COMMUNITY)
Admission: EM | Admit: 2016-08-07 | Discharge: 2016-08-07 | Disposition: A | Discharge status: Home / Self Care | Payer: PPO | Attending: Family Medicine | Admitting: Family Medicine

## 2016-08-07 DIAGNOSIS — M7502 Adhesive capsulitis of left shoulder: Secondary | ICD-10-CM

## 2016-08-07 MED ORDER — DICLOFENAC SODIUM 1 % TD GEL: 4.0000 g | Freq: Four times a day (QID) | TRANSDERMAL | 1 refills | Status: DC

## 2016-08-07 NOTE — ED Triage Notes (Signed)
Pt has a history of shoulder pain that is being treated by an orthopedist.  She has had steroid injections in both shoulders, but the left shoulder is not getting any better.  This morning she was having a lot of pain in the shoulder and she states she was also having some numbness and tingling in her left forearm and she also reports that she felt her heart beating faster.  Pt's BP is elevated but she has not had BP medication since yesterday morning.  She also reports waking up with a pain her left temple.

## 2016-08-07 NOTE — ED Provider Notes (Signed)
[facility]    CSN: [id number] Arrival date & time: 08/07/16  1029     History   Chief Complaint Chief Complaint  Patient presents with  . Shoulder Pain    HPI Holly [name] is a 75 y.o. female.   The history is provided by the patient.  Shoulder Pain  Location:  Shoulder Shoulder location:  L shoulder Injury: no   Pain details:    Quality:  Throbbing   Radiates to:  Does not radiate   Severity:  Moderate   Onset quality:  Gradual   Duration:  2 months Dislocation: no   Foreign body present:  No foreign bodies Associated symptoms: decreased range of motion and stiffness   Associated symptoms: no fever and no neck pain   Risk factors: no concern for non-accidental trauma     Past Medical History:  Diagnosis Date  . Bronchitis   . Hypertension     Patient Active Problem List   Diagnosis Date Noted  . 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 Carpal tunnel syndrome 08/09/2013    Past Surgical History:  Procedure Laterality Date  . left leg surgery      OB History    No data available       Home Medications    Prior to Admission medications   Medication Sig Start Date End Date Taking? Authorizing Provider  amLODipine (NORVASC) 10 MG tablet Take 10 mg by mouth daily.  04/02/14   Historical Provider, MD  atorvastatin (LIPITOR) 40 MG tablet Take 40 mg by mouth daily.  03/29/14   Historical Provider, MD  diclofenac sodium (VOLTAREN) 1 % GEL Apply 4 g topically 4 (four) times daily. To shoulder 08/07/16   Billy Fischer, MD  hydroxypropyl methylcellulose (ISOPTO TEARS) 2.5 % ophthalmic solution Place 1 drop into both eyes 3 (three) times daily as needed for dry eyes.    Historical Provider, MD  ibuprofen (ADVIL,MOTRIN) 600 MG tablet Take 600 mg by mouth every 8 (eight) hours as needed for mild pain.  02/03/14   Historical Provider, MD  irbesartan (AVAPRO) 300 MG tablet Take 300 mg by  mouth daily.  04/02/14   Historical Provider, MD  Multiple Vitamin (MULTIVITAMIN WITH MINERALS) TABS tablet Take 1 tablet by mouth daily.    Historical Provider, MD  RABEprazole (ACIPHEX) 20 MG tablet Take 20 mg by mouth daily.  04/04/14   Historical Provider, MD  venlafaxine XR (EFFEXOR-XR) 37.5 MG 24 hr capsule Take 37.5 mg by mouth daily with breakfast.  04/05/14   Historical Provider, MD    Family History History reviewed. No pertinent family history.  Social History Social History  Substance Use Topics  . Smoking status: Never Smoker  . Smokeless tobacco: Never Used  . Alcohol use No     Allergies   Review of patient's allergies indicates no known allergies.   Review of Systems Review of Systems  Constitutional: Negative for fever.  Musculoskeletal: Positive for stiffness. Negative for joint swelling and neck pain.  Neurological: Positive for numbness.  All other systems reviewed and are negative.    Physical Exam Triage Vital Signs ED Triage Vitals [08/07/16 1122]  Enc Vitals Group     BP 175/79     Pulse Rate 66     Resp 16     Temp 98.2 F (36.8 C)     Temp Source Oral     SpO2 99 %  Weight      Height      Head Circumference      Peak Flow      Pain Score 8     Pain Loc      Pain Edu?      Excl. in [place]?    No data found.   Updated Vital Signs BP 175/79 (BP Location: Right Arm)   Pulse 66   Temp 98.2 F (36.8 C) (Oral)   Resp 16   SpO2 99%   Visual Acuity Right Eye Distance:   Left Eye Distance:   Bilateral Distance:    Right Eye Near:   Left Eye Near:    Bilateral Near:     Physical Exam  Constitutional: She is oriented to person, place, and time. She appears well-developed and well-nourished.  Musculoskeletal: She exhibits tenderness. She exhibits no deformity.       Left shoulder: She exhibits decreased range of motion, tenderness, pain, spasm and decreased strength. She exhibits no swelling, no effusion, no crepitus and normal pulse.    Neurological: She is alert and oriented to person, place, and time.  Nursing note and vitals reviewed.    UC Treatments / Results  Labs (all labs ordered are listed, but only abnormal results are displayed) Labs Reviewed - No data to display  EKG  EKG Interpretation None       Radiology No results found.  Procedures Procedures (including critical care time)  Medications Ordered in UC Medications - No data to display   Initial Impression / Assessment and Plan / UC Course  I have reviewed the triage vital signs and the nursing notes.  Pertinent labs & imaging results that were available during my care of the patient were reviewed by me and considered in my medical decision making (see chart for details).  Clinical Course      Final Clinical Impressions(s) / UC Diagnoses   Final diagnoses:  Adhesive bursitis of left shoulder    New Prescriptions New Prescriptions   DICLOFENAC SODIUM (VOLTAREN) 1 % GEL    Apply 4 g topically 4 (four) times daily. To shoulder     Billy Fischer, MD 08/07/16 [phone]

## 2016-08-28 DIAGNOSIS — Z780 Asymptomatic menopausal state: Secondary | ICD-10-CM | POA: Diagnosis not present

## 2016-08-28 DIAGNOSIS — E782 Mixed hyperlipidemia: Secondary | ICD-10-CM | POA: Diagnosis not present

## 2016-08-28 DIAGNOSIS — N390 Urinary tract infection, site not specified: Secondary | ICD-10-CM | POA: Diagnosis not present

## 2016-08-28 DIAGNOSIS — I10 Essential (primary) hypertension: Secondary | ICD-10-CM | POA: Diagnosis not present

## 2016-08-28 DIAGNOSIS — G609 Hereditary and idiopathic neuropathy, unspecified: Secondary | ICD-10-CM | POA: Diagnosis not present

## 2016-08-28 DIAGNOSIS — Z0000 Encounter for general adult medical examination without abnormal findings: Secondary | ICD-10-CM | POA: Diagnosis not present

## 2016-08-28 DIAGNOSIS — Z23 Encounter for immunization: Secondary | ICD-10-CM | POA: Diagnosis not present

## 2016-09-04 DIAGNOSIS — E782 Mixed hyperlipidemia: Secondary | ICD-10-CM | POA: Diagnosis not present

## 2016-09-04 DIAGNOSIS — J4520 Mild intermittent asthma, uncomplicated: Secondary | ICD-10-CM | POA: Diagnosis not present

## 2016-09-04 DIAGNOSIS — K219 Gastro-esophageal reflux disease without esophagitis: Secondary | ICD-10-CM | POA: Diagnosis not present

## 2016-09-04 DIAGNOSIS — I10 Essential (primary) hypertension: Secondary | ICD-10-CM | POA: Diagnosis not present

## 2016-09-11 DIAGNOSIS — Z1211 Encounter for screening for malignant neoplasm of colon: Secondary | ICD-10-CM | POA: Diagnosis not present

## 2016-09-11 DIAGNOSIS — Z1212 Encounter for screening for malignant neoplasm of rectum: Secondary | ICD-10-CM | POA: Diagnosis not present

## 2016-11-15 DIAGNOSIS — H40023 Open angle with borderline findings, high risk, bilateral: Secondary | ICD-10-CM | POA: Diagnosis not present

## 2016-11-15 DIAGNOSIS — H1013 Acute atopic conjunctivitis, bilateral: Secondary | ICD-10-CM | POA: Diagnosis not present

## 2016-12-18 DIAGNOSIS — E782 Mixed hyperlipidemia: Secondary | ICD-10-CM | POA: Diagnosis not present

## 2016-12-31 DIAGNOSIS — I10 Essential (primary) hypertension: Secondary | ICD-10-CM | POA: Diagnosis not present

## 2016-12-31 DIAGNOSIS — E782 Mixed hyperlipidemia: Secondary | ICD-10-CM | POA: Diagnosis not present

## 2016-12-31 DIAGNOSIS — G609 Hereditary and idiopathic neuropathy, unspecified: Secondary | ICD-10-CM | POA: Diagnosis not present

## 2016-12-31 DIAGNOSIS — L03115 Cellulitis of right lower limb: Secondary | ICD-10-CM | POA: Diagnosis not present

## 2017-01-16 DIAGNOSIS — I10 Essential (primary) hypertension: Secondary | ICD-10-CM | POA: Diagnosis not present

## 2017-01-16 DIAGNOSIS — L237 Allergic contact dermatitis due to plants, except food: Secondary | ICD-10-CM | POA: Diagnosis not present

## 2017-01-16 DIAGNOSIS — E782 Mixed hyperlipidemia: Secondary | ICD-10-CM | POA: Diagnosis not present

## 2017-01-16 DIAGNOSIS — G609 Hereditary and idiopathic neuropathy, unspecified: Secondary | ICD-10-CM | POA: Diagnosis not present

## 2017-01-23 DIAGNOSIS — R21 Rash and other nonspecific skin eruption: Secondary | ICD-10-CM | POA: Diagnosis not present

## 2017-01-23 DIAGNOSIS — L03115 Cellulitis of right lower limb: Secondary | ICD-10-CM | POA: Diagnosis not present

## 2017-01-29 DIAGNOSIS — R21 Rash and other nonspecific skin eruption: Secondary | ICD-10-CM | POA: Diagnosis not present

## 2017-02-20 DIAGNOSIS — L0109 Other impetigo: Secondary | ICD-10-CM | POA: Diagnosis not present

## 2017-02-20 DIAGNOSIS — L720 Epidermal cyst: Secondary | ICD-10-CM | POA: Diagnosis not present

## 2017-02-20 DIAGNOSIS — L0889 Other specified local infections of the skin and subcutaneous tissue: Secondary | ICD-10-CM | POA: Diagnosis not present

## 2017-02-20 DIAGNOSIS — L309 Dermatitis, unspecified: Secondary | ICD-10-CM | POA: Diagnosis not present

## 2017-03-05 ENCOUNTER — Ambulatory Visit (INDEPENDENT_AMBULATORY_CARE_PROVIDER_SITE_OTHER): Payer: PPO

## 2017-03-05 ENCOUNTER — Ambulatory Visit (INDEPENDENT_AMBULATORY_CARE_PROVIDER_SITE_OTHER): Payer: PPO | Admitting: Physical Medicine and Rehabilitation

## 2017-03-05 ENCOUNTER — Encounter (INDEPENDENT_AMBULATORY_CARE_PROVIDER_SITE_OTHER): Payer: Self-pay | Admitting: Physical Medicine and Rehabilitation

## 2017-03-05 VITALS — BP 149/78

## 2017-03-05 DIAGNOSIS — M25551 Pain in right hip: Secondary | ICD-10-CM

## 2017-03-05 NOTE — Progress Notes (Signed)
Holly [name] - 76 y.o. female MRN [id number]  Date of birth: 28-Jul-1941  Office Visit Note: Visit Date: 03/05/2017 PCP: No PCP Per Patient Referred by: No ref. provider found  Subjective: Chief Complaint  Patient presents with  . 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 Prior hip injection in August of last year never quite a bit relief for some time. She has decreased range of motion bilaterally with a walker. She is normally followed by Dr. Ninfa Linden. We will go ahead and repeat this today and she'll follow-up with Dr. Ninfa Linden if her symptoms returned.    ROS Otherwise per HPI.  Assessment & Plan: Visit Diagnoses:  1. Pain in right hip     Plan: Findings:  Right hip intra-articular injection fluoroscopic guidance. Patient had good relief with the anesthetic portion of the injection.    Meds & Orders: No orders of the defined types were placed in this encounter.   Orders Placed This Encounter  Procedures  . Large Joint Injection/Arthrocentesis  . XR C-ARM NO REPORT    Follow-up: Return if symptoms worsen or fail to improve.   Procedures: Intra-articular hip injection fluoroscopic guidance Date/Time: 03/05/2017 2:33 PM Performed by: Magnus Sinning Authorized by: Magnus Sinning   Consent Given by:  Patient Site marked: the procedure site was marked   Timeout: prior to procedure the correct patient, procedure, and site was verified   Indications:  Pain and diagnostic evaluation Location:  Hip Site:  R hip joint Prep: patient was prepped and draped in usual sterile fashion   Needle Size:  22 G Needle Length:  3.5 inches Approach:  Anterior Ultrasound Guidance: No   Fluoroscopic Guidance: Yes   Arthrogram: No   Medications:  3 mL bupivacaine 0.5 %; 80 mg triamcinolone acetonide 40 MG/ML Aspiration Attempted: Yes   Patient tolerance:  Patient tolerated the procedure well with no  immediate complications  There was excellent flow of contrast producing a partial arthrogram of the hip. The patient did have relief of symptoms during the anesthetic phase of the injection.     No notes on file   Clinical History: No specialty comments available.  She reports that she has never smoked. She has never used smokeless tobacco. No results for input(s): HGBA1C, LABURIC in the last 8760 hours.  Objective:  VS:  HT:    WT:   BMI:     BP:(!) 149/78  HR: bpm  TEMP: ( )  RESP:  Physical Exam  Musculoskeletal:  Patient has concordant pain with right hip in internal rotation. She has an antalgic gait with good distal strength.    Ortho Exam Imaging: Xr C-arm No Report  Result Date: 03/05/2017 Please see Notes or Procedures tab for imaging impression.   Past Medical/Family/Surgical/Social History: Medications & Allergies reviewed per EMR Patient Active Problem List   Diagnosis Date Noted  . 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 Carpal tunnel syndrome 08/09/2013   Past Medical History:  Diagnosis Date  . Bronchitis   . Hypertension    History reviewed. No pertinent family history. Past Surgical History:  Procedure Laterality Date  . left leg surgery     Social History   Occupational History  . Not on file.   Social History Main Topics  . Smoking status: Never Smoker  . Smokeless tobacco: Never Used  . Alcohol use No  .  Drug use: Unknown  . Sexual activity: Not on file

## 2017-03-05 NOTE — Patient Instructions (Signed)

## 2017-03-05 NOTE — Progress Notes (Deleted)
Right hip pain. Radiates down side of leg to ankle. Denies groin pain. Difficulty getting up from seated and laying position. Once she is up moving she is better.

## 2017-03-06 MED ORDER — TRIAMCINOLONE ACETONIDE 40 MG/ML IJ SUSP
80.0000 mg | INTRAMUSCULAR | Status: AC | PRN
  Administered 2017-03-05: 80 mg via INTRA_ARTICULAR

## 2017-03-06 MED ORDER — BUPIVACAINE HCL 0.5 % IJ SOLN
3.0000 mL | INTRAMUSCULAR | Status: AC | PRN
  Administered 2017-03-05: 3 mL via INTRA_ARTICULAR

## 2017-03-10 ENCOUNTER — Encounter (INDEPENDENT_AMBULATORY_CARE_PROVIDER_SITE_OTHER): Payer: Self-pay | Admitting: Orthopedic Surgery

## 2017-03-10 ENCOUNTER — Ambulatory Visit (INDEPENDENT_AMBULATORY_CARE_PROVIDER_SITE_OTHER): Payer: PPO | Admitting: Orthopedic Surgery

## 2017-03-10 DIAGNOSIS — M75122 Complete rotator cuff tear or rupture of left shoulder, not specified as traumatic: Secondary | ICD-10-CM

## 2017-03-12 DIAGNOSIS — M75122 Complete rotator cuff tear or rupture of left shoulder, not specified as traumatic: Secondary | ICD-10-CM | POA: Diagnosis not present

## 2017-03-12 MED ORDER — BUPIVACAINE HCL 0.5 % IJ SOLN
9.0000 mL | INTRAMUSCULAR | Status: AC | PRN
  Administered 2017-03-12: 9 mL via INTRA_ARTICULAR

## 2017-03-12 MED ORDER — LIDOCAINE HCL 1 % IJ SOLN
5.0000 mL | INTRAMUSCULAR | Status: AC | PRN
  Administered 2017-03-12: 5 mL

## 2017-03-12 MED ORDER — METHYLPREDNISOLONE ACETATE 40 MG/ML IJ SUSP
40.0000 mg | INTRAMUSCULAR | Status: AC | PRN
  Administered 2017-03-12: 40 mg via INTRA_ARTICULAR

## 2017-03-12 NOTE — Progress Notes (Signed)
Office Visit Note   Patient: Holly [name]           Date of Birth: February 09, 1941           MRN: [id number] Visit Date: 03/10/2017 Requested by: No referring provider defined for this encounter. PCP: Patient, No Pcp Per  Subjective: Chief Complaint  Patient presents with  . Left Shoulder - Pain    HPI: Holly [name] is a 40 show patient with left shoulder pain.  She's had previous injection with release done last year at this time.  Left shoulder pain recurred for the last 78 months.  Can't raise the arm to how without pain.  Hip injection has helped her significantly done 2 weeks ago.  She's tried Tylenol and ibuprofen for the shoulder without relief.  Denies any interval history of injury.              ROS: All systems reviewed are negative as they relate to the chief complaint within the history of present illness.  Patient denies  fevers or chills.   Assessment & Plan: Visit Diagnoses: No diagnosis found.  Plan: Impression is left shoulder pain early rotator cuff arthropathy with noted history of rotator cuff pathology from MRI scan done ears ago.  Plan is subacromial injection for continued pain relief.  Functionally she is actually doing recently well with that shoulder but is having pain all see her back as needed  Follow-Up Instructions: Return if symptoms worsen or fail to improve.   Orders:  No orders of the defined types were placed in this encounter.  No orders of the defined types were placed in this encounter.     Procedures: Large Joint Inj Date/Time: 03/12/2017 12:02 PM Performed by: Meredith Pel Authorized by: Meredith Pel   Consent Given by:  Patient Site marked: the procedure site was marked   Timeout: prior to procedure the correct patient, procedure, and site was verified   Indications:  Pain and diagnostic evaluation Location:  Shoulder Site:  L subacromial bursa Prep: patient was prepped and draped in usual sterile fashion   Needle Size:  18  G Needle Length:  1.5 inches Approach:  Posterior Ultrasound Guidance: No   Fluoroscopic Guidance: No   Arthrogram: No   Medications:  5 mL lidocaine 1 %; 9 mL bupivacaine 0.5 %; 40 mg methylPREDNISolone acetate 40 MG/ML Aspiration Attempted: No   Patient tolerance:  Patient tolerated the procedure well with no immediate complications     Clinical Data: No additional findings.  Objective: Vital Signs: There were no vitals taken for this visit.  Physical Exam:   Constitutional: Patient appears well-developed HEENT:  Head: Normocephalic Eyes:EOM are normal Neck: Normal range of motion Cardiovascular: Normal rate Pulmonary/chest: Effort normal Neurologic: Patient is alert Skin: Skin is warm Psychiatric: Patient has normal mood and affect    Ortho Exam: Orthopedic exam demonstrates positive impingement signs on the left with slight weakness and super status testing.  No restriction of passive external rotation at 15 abduction.  She does have a little bit of course grinding and crepitus with internal/external rotation of that right and left shoulder above 90 of flexion.  No acromioclavicular joint tenderness is noted on the left-hand side.  No other masses, adenopathy or skin changes noted in the shoulder region.  Specialty Comments:  No specialty comments available.  Imaging: No results found.   PMFS History: Patient Active Problem List   Diagnosis Date Noted  . Unspecified hereditary and idiopathic peripheral  neuropathy 08/09/2013  . 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 Carpal tunnel syndrome 08/09/2013   Past Medical History:  Diagnosis Date  . Bronchitis   . Hypertension     No family history on file.  Past Surgical History:  Procedure Laterality Date  . left leg surgery     Social History   Occupational History  . Not on file.   Social History Main Topics  . Smoking status: Never Smoker  . Smokeless tobacco: Never Used  . Alcohol use No  . Drug use:  Unknown  . Sexual activity: Not on file

## 2017-04-02 DIAGNOSIS — Z961 Presence of intraocular lens: Secondary | ICD-10-CM | POA: Diagnosis not present

## 2017-04-02 DIAGNOSIS — H40023 Open angle with borderline findings, high risk, bilateral: Secondary | ICD-10-CM | POA: Diagnosis not present

## 2017-04-02 DIAGNOSIS — H04123 Dry eye syndrome of bilateral lacrimal glands: Secondary | ICD-10-CM | POA: Diagnosis not present

## 2017-04-02 DIAGNOSIS — H1013 Acute atopic conjunctivitis, bilateral: Secondary | ICD-10-CM | POA: Diagnosis not present

## 2017-06-19 DIAGNOSIS — I10 Essential (primary) hypertension: Secondary | ICD-10-CM | POA: Diagnosis not present

## 2017-06-19 DIAGNOSIS — E782 Mixed hyperlipidemia: Secondary | ICD-10-CM | POA: Diagnosis not present

## 2017-06-26 DIAGNOSIS — I129 Hypertensive chronic kidney disease with stage 1 through stage 4 chronic kidney disease, or unspecified chronic kidney disease: Secondary | ICD-10-CM | POA: Diagnosis not present

## 2017-06-26 DIAGNOSIS — E782 Mixed hyperlipidemia: Secondary | ICD-10-CM | POA: Diagnosis not present

## 2017-07-08 ENCOUNTER — Emergency Department (HOSPITAL_COMMUNITY)
Admission: EM | Admit: 2017-07-08 | Discharge: 2017-07-09 | Disposition: A | Discharge status: Home / Self Care | Payer: PPO | Attending: Emergency Medicine | Admitting: Emergency Medicine

## 2017-07-08 DIAGNOSIS — R1030 Lower abdominal pain, unspecified: Secondary | ICD-10-CM | POA: Diagnosis not present

## 2017-07-08 DIAGNOSIS — Z79899 Other long term (current) drug therapy: Secondary | ICD-10-CM | POA: Diagnosis not present

## 2017-07-08 DIAGNOSIS — N281 Cyst of kidney, acquired: Secondary | ICD-10-CM | POA: Diagnosis not present

## 2017-07-08 DIAGNOSIS — I10 Essential (primary) hypertension: Secondary | ICD-10-CM | POA: Insufficient documentation

## 2017-07-08 DIAGNOSIS — R221 Localized swelling, mass and lump, neck: Secondary | ICD-10-CM | POA: Diagnosis not present

## 2017-07-08 DIAGNOSIS — R072 Precordial pain: Secondary | ICD-10-CM | POA: Diagnosis not present

## 2017-07-08 DIAGNOSIS — R079 Chest pain, unspecified: Secondary | ICD-10-CM | POA: Diagnosis not present

## 2017-07-08 DIAGNOSIS — R55 Syncope and collapse: Secondary | ICD-10-CM | POA: Insufficient documentation

## 2017-07-08 DIAGNOSIS — R109 Unspecified abdominal pain: Secondary | ICD-10-CM | POA: Diagnosis not present

## 2017-07-08 DIAGNOSIS — R51 Headache: Secondary | ICD-10-CM | POA: Diagnosis not present

## 2017-07-08 DIAGNOSIS — I700 Atherosclerosis of aorta: Secondary | ICD-10-CM | POA: Diagnosis not present

## 2017-07-08 LAB — COMPREHENSIVE METABOLIC PANEL
ALT: 13 U/L — ABNORMAL LOW (ref 14–54)
AST: 28 U/L (ref 15–41)
Albumin: 3.5 g/dL (ref 3.5–5.0)
Alkaline Phosphatase: 57 U/L (ref 38–126)
Anion gap: 8 (ref 5–15)
BUN: 22 mg/dL — ABNORMAL HIGH (ref 6–20)
CHLORIDE: 107 mmol/L (ref 101–111)
CO2: 21 mmol/L — ABNORMAL LOW (ref 22–32)
Calcium: 9.4 mg/dL (ref 8.9–10.3)
Creatinine, Ser: 1.08 mg/dL — ABNORMAL HIGH (ref 0.44–1.00)
GFR, EST AFRICAN AMERICAN: 56 mL/min — AB (ref >60)
GFR, EST NON AFRICAN AMERICAN: 48 mL/min — AB (ref >60)
Glucose, Bld: 105 mg/dL — ABNORMAL HIGH (ref 65–99)
POTASSIUM: 4.2 mmol/L (ref 3.5–5.1)
SODIUM: 136 mmol/L (ref 135–145)
Total Bilirubin: 0.7 mg/dL (ref 0.3–1.2)
Total Protein: 6.9 g/dL (ref 6.5–8.1)

## 2017-07-08 LAB — CBC WITH DIFFERENTIAL/PLATELET
BASOS ABS: 0 10*3/uL (ref 0.0–0.1)
Basophils Relative: 1 %
EOS PCT: 4 %
Eosinophils Absolute: 0.2 10*3/uL (ref 0.0–0.7)
HCT: 33.5 % — ABNORMAL LOW (ref 36.0–46.0)
Hemoglobin: 11.1 g/dL — ABNORMAL LOW (ref 12.0–15.0)
LYMPHS ABS: 1.1 10*3/uL (ref 0.7–4.0)
LYMPHS PCT: 19 %
MCH: 28.6 pg (ref 26.0–34.0)
MCHC: 33.1 g/dL (ref 30.0–36.0)
MCV: 86.3 fL (ref 78.0–100.0)
Monocytes Absolute: 0.8 10*3/uL (ref 0.1–1.0)
Monocytes Relative: 15 %
NEUTROS ABS: 3.3 10*3/uL (ref 1.7–7.7)
Neutrophils Relative %: 61 %
PLATELETS: 272 10*3/uL (ref 150–400)
RBC: 3.88 MIL/uL (ref 3.87–5.11)
RDW: 14.2 % (ref 11.5–15.5)
WBC: 5.5 10*3/uL (ref 4.0–10.5)

## 2017-07-08 LAB — TROPONIN I

## 2017-07-08 LAB — LIPASE, BLOOD: LIPASE: 42 U/L (ref 11–51)

## 2017-07-08 MED ORDER — IOPAMIDOL (ISOVUE-300) INJECTION 61%
INTRAVENOUS | Status: AC
  Administered 2017-07-09: 100 mL
  Filled 2017-07-08: qty 100

## 2017-07-08 MED ORDER — IOPAMIDOL (ISOVUE-300) INJECTION 61%
INTRAVENOUS | Status: AC
  Filled 2017-07-08: qty 75

## 2017-07-08 MED ORDER — SODIUM CHLORIDE 0.9 % IV BOLUS (SEPSIS)
500.0000 mL | Freq: Once | INTRAVENOUS | Status: AC
  Administered 2017-07-08: 500 mL via INTRAVENOUS

## 2017-07-08 NOTE — ED Notes (Signed)
Pt aware we need urine.

## 2017-07-08 NOTE — ED Triage Notes (Addendum)
Per [facility],  Pt was at church this evening, started feeling weak. Pt has pulsating mass to R side lower neck.pt states this has been there for about a month.  Pt was seen at doctor today, where they did CXR. Pt has a slight headache. Pt alert and oriented. BP 189/95.

## 2017-07-08 NOTE — ED Notes (Signed)
Pt aware we still need urine

## 2017-07-09 ENCOUNTER — Emergency Department (HOSPITAL_COMMUNITY): Payer: PPO

## 2017-07-09 ENCOUNTER — Encounter (HOSPITAL_COMMUNITY): Payer: Self-pay

## 2017-07-09 DIAGNOSIS — R51 Headache: Secondary | ICD-10-CM | POA: Diagnosis not present

## 2017-07-09 DIAGNOSIS — N281 Cyst of kidney, acquired: Secondary | ICD-10-CM | POA: Diagnosis not present

## 2017-07-09 DIAGNOSIS — I700 Atherosclerosis of aorta: Secondary | ICD-10-CM | POA: Diagnosis not present

## 2017-07-09 DIAGNOSIS — R221 Localized swelling, mass and lump, neck: Secondary | ICD-10-CM | POA: Diagnosis not present

## 2017-07-09 LAB — URINALYSIS, ROUTINE W REFLEX MICROSCOPIC
Bilirubin Urine: NEGATIVE
GLUCOSE, UA: NEGATIVE mg/dL
Hgb urine dipstick: NEGATIVE
KETONES UR: NEGATIVE mg/dL
LEUKOCYTES UA: NEGATIVE
Nitrite: NEGATIVE
PH: 6 (ref 5.0–8.0)
Protein, ur: NEGATIVE mg/dL
SPECIFIC GRAVITY, URINE: 1.004 — AB (ref 1.005–1.030)

## 2017-07-09 NOTE — ED Provider Notes (Signed)
[facility] DEPT Provider Note   CSN: [id number] Arrival date & time: 07/08/17  2100     History   Chief Complaint Chief Complaint  Patient presents with  . Neck Pain    HPI Holly [name] is a 76 y.o. female.  HPI Patient brought in after near syncopal episode at church. States she been kneeling down and began to feel lightheaded. States she's been having episodes of last couple weeks. States her blood pressures were up and she was started on blood pressure medicine a few weeks ago. States that it was too much and it was decreased from 100 mg to 25 mg. Has had occasional dull headaches. She does however for the last month have a swelling on her right lower lateral neck. It is pulsing with her heart. It does not hurt. No weight loss. No fevers. Doesn't times have some dull abdominal pain that goes from the right side around to the left and then back to the right side. No numbness or weakness. Past Medical History:  Diagnosis Date  . Bronchitis   . Hypertension     Patient Active Problem List   Diagnosis Date Noted  . 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 Carpal tunnel syndrome 08/09/2013    Past Surgical History:  Procedure Laterality Date  . left leg surgery      OB History    No data available       Home Medications    Prior to Admission medications   Medication Sig Start Date End Date Taking? Authorizing Provider  acetaminophen-codeine (TYLENOL #3) 300-30 MG tablet Take 1 tablet by mouth 2 (two) times daily as needed for pain. 05/14/17  Yes [provider]  amLODipine (NORVASC) 5 MG tablet Take 5 mg by mouth daily. 07/08/17  Yes [provider]  Calcium Carb-Cholecalciferol (CALCIUM 600 + D) 600-200 MG-UNIT TABS Take 1 tablet by mouth daily.   Yes [provider]  clotrimazole-betamethasone (LOTRISONE) cream Apply 1 application topically 2 (two) times daily.  01/27/17  Yes  [provider]  diclofenac sodium (VOLTAREN) 1 % GEL Apply 4 g topically 4 (four) times daily. To shoulder 08/07/16  Yes Kindl, Nelda Severe, MD  hydroxypropyl methylcellulose (ISOPTO TEARS) 2.5 % ophthalmic solution Place 1 drop into both eyes 3 (three) times daily as needed for dry eyes.   Yes [provider]  ibuprofen (ADVIL,MOTRIN) 600 MG tablet Take 600 mg by mouth every 8 (eight) hours as needed for mild pain.  02/03/14  Yes [provider]  irbesartan (AVAPRO) 300 MG tablet Take 300 mg by mouth daily.  04/02/14  Yes [provider]  PATADAY 0.2 % SOLN Apply 1 drop to eye daily.  02/24/17  Yes [provider]  Probiotic Product (PROBIOTIC PO) Take 1 capsule by mouth daily.   Yes [provider]  RABEprazole (ACIPHEX) 20 MG tablet Take 20 mg by mouth daily as needed (acid reflux).  04/04/14  Yes [provider]  triamterene-hydrochlorothiazide (MAXZIDE-25) 37.5-25 MG tablet Take 1 tablet by mouth daily.  12/31/16  Yes [provider]  vitamin B-12 (CYANOCOBALAMIN) 100 MCG tablet Take 100 mcg by mouth daily.   Yes [provider]  cetirizine (ZYRTEC) 10 MG tablet Take 10 mg by mouth daily as needed. 03/06/17   [provider]    Family History No family history on file.  Social History Social History  Substance Use Topics  . Smoking status: Never  Smoker  . Smokeless tobacco: Never Used  . Alcohol use No     Allergies   Lisinopril and Venlafaxine   Review of Systems Review of Systems  Constitutional: Negative for appetite change.  HENT: Negative for congestion and trouble swallowing.   Respiratory: Negative for chest tightness.   Cardiovascular: Negative for chest pain.  Gastrointestinal: Positive for abdominal pain.  Genitourinary: Positive for flank pain.  Musculoskeletal: Negative for back pain.  Neurological: Positive for light-headedness and headaches.  Hematological: Negative for adenopathy.    Psychiatric/Behavioral: Negative for agitation and confusion.     Physical Exam Updated Vital Signs BP (!) 150/82   Pulse 87   Temp 99.5 F (37.5 C) (Oral)   Resp 14   Ht 4\' 5"  (1.346 m)   Wt 88 kg (194 lb)   SpO2 100%   BMI 48.56 kg/m   Physical Exam  Constitutional: She appears well-developed.  HENT:  Head: Atraumatic.  Eyes: EOM are normal.  Neck:  Base of the right neck has approximately 2.5 cm firm mass. It is pulsatile sitting on top of the neck vasculature. The mass itself does not appear to be soft like it is a vessel.  Cardiovascular: Normal rate.   Pulmonary/Chest: Effort normal.  Abdominal: She exhibits no distension. There is no tenderness.  Musculoskeletal: She exhibits no edema.  Skin: Skin is warm. Capillary refill takes less than 2 seconds.     ED Treatments / Results  Labs (all labs ordered are listed, but only abnormal results are displayed) Labs Reviewed  CBC WITH DIFFERENTIAL/PLATELET - Abnormal; Notable for the following:       Result Value   Hemoglobin 11.1 (*)    HCT 33.5 (*)    All other components within normal limits  COMPREHENSIVE METABOLIC PANEL - Abnormal; Notable for the following:    CO2 21 (*)    Glucose, Bld 105 (*)    BUN 22 (*)    Creatinine, Ser 1.08 (*)    ALT 13 (*)    GFR calc non Af Amer 48 (*)    GFR calc Af Amer 56 (*)    All other components within normal limits  URINALYSIS, ROUTINE W REFLEX MICROSCOPIC - Abnormal; Notable for the following:    Color, Urine STRAW (*)    Specific Gravity, Urine 1.004 (*)    All other components within normal limits  TROPONIN I  LIPASE, BLOOD    EKG  EKG Interpretation  Date/Time:  Tuesday July 08 2017 21:08:35 EDT Ventricular Rate:  98 PR Interval:    QRS Duration: 133 QT Interval:  369 QTC Calculation: 472 R Axis:   -63 Text Interpretation:  Sinus or ectopic atrial rhythm Prolonged PR interval RBBB and LAFB Left ventricular hypertrophy Confirmed by Davonna Belling  [phone]) on 07/08/2017 11:22:20 PM       Radiology No results found.  Procedures Procedures (including critical care time)  Medications Ordered in ED Medications  sodium chloride 0.9 % bolus 500 mL (500 mLs Intravenous New Bag/Given 07/08/17 2353)  iopamidol (ISOVUE-300) 61 % injection (100 mLs  Contrast Given 07/09/17 0032)     Initial Impression / Assessment and Plan / ED Course  I have reviewed the triage vital signs and the nursing notes.  Pertinent labs & imaging results that were available during my care of the patient were reviewed by me and considered in my medical decision making (see chart for details).     Patient with near syncopal episode. Labs overall reassuring.  Not orthostatic. However does have neck mass. CT scans will be done. Likely will be able to be discharged home depending on the results though. Care turned over to Dr. Vanita Panda.  Final Clinical Impressions(s) / ED Diagnoses   Final diagnoses:  Near syncope  Neck mass    New Prescriptions New Prescriptions   No medications on file     Davonna Belling, MD 07/09/17 [phone]

## 2017-07-09 NOTE — ED Notes (Signed)
Pt back from CT

## 2017-07-09 NOTE — Discharge Instructions (Signed)
As discussed, your evaluation today has been largely reassuring.  But, it is important that you monitor your condition carefully, and do not hesitate to return to the ED if you develop new, or concerning changes in your condition. ? ?Otherwise, please follow-up with your physician for appropriate ongoing care. ? ?

## 2017-07-09 NOTE — ED Provider Notes (Signed)
2:50 AM Patient awake and alert, states that she feels better. I discussed all findings again with multiple family members and the patient. There is some live evidence for dehydration, and this is likely to continue to her episode of near-syncope. Patient has follow-up scheduled with cardiology in 24 hours. Was generally reassuring findings, patient was encouraged to keep that appointment, and in the interim, to monitor her condition carefully. With consideration of her antihypertensives as contributing to her episode, patient will hold additional medication, which were prescribed today until that cardiology visit in 24 hours. Patient's neck lesion is appreciated again, and CTs reassuring in this regard, with no evidence for vascular involvement, patient referred to ENT.   [name], MD 07/09/17 [phone]

## 2017-07-10 DIAGNOSIS — R0789 Other chest pain: Secondary | ICD-10-CM | POA: Diagnosis not present

## 2017-07-10 DIAGNOSIS — I10 Essential (primary) hypertension: Secondary | ICD-10-CM | POA: Diagnosis not present

## 2017-07-10 DIAGNOSIS — R0602 Shortness of breath: Secondary | ICD-10-CM | POA: Diagnosis not present

## 2017-07-10 DIAGNOSIS — E782 Mixed hyperlipidemia: Secondary | ICD-10-CM | POA: Diagnosis not present

## 2017-07-11 ENCOUNTER — Telehealth: Payer: Self-pay | Admitting: *Deleted

## 2017-07-11 DIAGNOSIS — M542 Cervicalgia: Secondary | ICD-10-CM | POA: Diagnosis not present

## 2017-07-11 NOTE — Telephone Encounter (Signed)
Pt called stating she misplaced her AVS and does not remember the name of the ENT she was referred to.  EDCM reviewed chart to find pt was referred to Melida Quitter, MD.  [facility] provided address and phone number to pt.  Pt appreciative, no further CM needs identified at this time.

## 2017-07-14 DIAGNOSIS — R002 Palpitations: Secondary | ICD-10-CM | POA: Diagnosis not present

## 2017-07-15 ENCOUNTER — Inpatient Hospital Stay: Admission: AD | Admit: 2017-07-15 | Payer: PPO | Source: Ambulatory Visit | Admitting: Cardiology

## 2017-07-15 ENCOUNTER — Emergency Department (HOSPITAL_COMMUNITY): Payer: PPO

## 2017-07-15 ENCOUNTER — Inpatient Hospital Stay (HOSPITAL_COMMUNITY)
Admission: EM | Admit: 2017-07-15 | Discharge: 2017-07-16 | DRG: 243 | Disposition: A | Discharge status: Home / Self Care | Payer: PPO | Attending: Cardiology | Admitting: Cardiology

## 2017-07-15 ENCOUNTER — Inpatient Hospital Stay (HOSPITAL_COMMUNITY): Payer: PPO

## 2017-07-15 ENCOUNTER — Encounter (HOSPITAL_COMMUNITY): Payer: Self-pay

## 2017-07-15 ENCOUNTER — Encounter (HOSPITAL_COMMUNITY)
Admission: EM | Disposition: A | Discharge status: Home / Self Care | Payer: Self-pay | Source: Home / Self Care | Attending: Cardiology

## 2017-07-15 DIAGNOSIS — R001 Bradycardia, unspecified: Secondary | ICD-10-CM | POA: Diagnosis not present

## 2017-07-15 DIAGNOSIS — I10 Essential (primary) hypertension: Secondary | ICD-10-CM | POA: Diagnosis not present

## 2017-07-15 DIAGNOSIS — Z833 Family history of diabetes mellitus: Secondary | ICD-10-CM | POA: Diagnosis not present

## 2017-07-15 DIAGNOSIS — Z95818 Presence of other cardiac implants and grafts: Secondary | ICD-10-CM

## 2017-07-15 DIAGNOSIS — R109 Unspecified abdominal pain: Secondary | ICD-10-CM | POA: Diagnosis present

## 2017-07-15 DIAGNOSIS — Z79899 Other long term (current) drug therapy: Secondary | ICD-10-CM | POA: Diagnosis not present

## 2017-07-15 DIAGNOSIS — Z950 Presence of cardiac pacemaker: Secondary | ICD-10-CM | POA: Insufficient documentation

## 2017-07-15 DIAGNOSIS — K219 Gastro-esophageal reflux disease without esophagitis: Secondary | ICD-10-CM | POA: Diagnosis not present

## 2017-07-15 DIAGNOSIS — I442 Atrioventricular block, complete: Secondary | ICD-10-CM | POA: Diagnosis not present

## 2017-07-15 DIAGNOSIS — Z8249 Family history of ischemic heart disease and other diseases of the circulatory system: Secondary | ICD-10-CM

## 2017-07-15 DIAGNOSIS — E871 Hypo-osmolality and hyponatremia: Secondary | ICD-10-CM | POA: Diagnosis not present

## 2017-07-15 DIAGNOSIS — R531 Weakness: Secondary | ICD-10-CM

## 2017-07-15 DIAGNOSIS — R011 Cardiac murmur, unspecified: Secondary | ICD-10-CM | POA: Diagnosis not present

## 2017-07-15 DIAGNOSIS — Z01818 Encounter for other preprocedural examination: Secondary | ICD-10-CM | POA: Diagnosis not present

## 2017-07-15 HISTORY — DX: Atrioventricular block, complete: I44.2

## 2017-07-15 HISTORY — PX: PACEMAKER IMPLANT: EP1218

## 2017-07-15 LAB — BASIC METABOLIC PANEL
Anion gap: 9 (ref 5–15)
BUN: 16 mg/dL (ref 6–20)
CALCIUM: 10.4 mg/dL — AB (ref 8.9–10.3)
CO2: 19 mmol/L — AB (ref 22–32)
CREATININE: 1.27 mg/dL — AB (ref 0.44–1.00)
Chloride: 100 mmol/L — ABNORMAL LOW (ref 101–111)
GFR calc non Af Amer: 40 mL/min — ABNORMAL LOW (ref >60)
GFR, EST AFRICAN AMERICAN: 46 mL/min — AB (ref >60)
Glucose, Bld: 122 mg/dL — ABNORMAL HIGH (ref 65–99)
Potassium: 4.5 mmol/L (ref 3.5–5.1)
SODIUM: 128 mmol/L — AB (ref 135–145)

## 2017-07-15 LAB — COMPREHENSIVE METABOLIC PANEL
ALK PHOS: 57 U/L (ref 38–126)
ALT: 11 U/L — ABNORMAL LOW (ref 14–54)
ANION GAP: 10 (ref 5–15)
AST: 17 U/L (ref 15–41)
Albumin: 3.8 g/dL (ref 3.5–5.0)
BILIRUBIN TOTAL: 0.6 mg/dL (ref 0.3–1.2)
BUN: 15 mg/dL (ref 6–20)
CALCIUM: 10.4 mg/dL — AB (ref 8.9–10.3)
CO2: 17 mmol/L — ABNORMAL LOW (ref 22–32)
Chloride: 102 mmol/L (ref 101–111)
Creatinine, Ser: 1.18 mg/dL — ABNORMAL HIGH (ref 0.44–1.00)
GFR calc non Af Amer: 43 mL/min — ABNORMAL LOW (ref >60)
GFR, EST AFRICAN AMERICAN: 50 mL/min — AB (ref >60)
Glucose, Bld: 102 mg/dL — ABNORMAL HIGH (ref 65–99)
Potassium: 4.1 mmol/L (ref 3.5–5.1)
Sodium: 129 mmol/L — ABNORMAL LOW (ref 135–145)
TOTAL PROTEIN: 7.8 g/dL (ref 6.5–8.1)

## 2017-07-15 LAB — CBC
HCT: 33.9 % — ABNORMAL LOW (ref 36.0–46.0)
HCT: 33.6 % — ABNORMAL LOW (ref 36.0–46.0)
HEMOGLOBIN: 11.4 g/dL — AB (ref 12.0–15.0)
Hemoglobin: 11.4 g/dL — ABNORMAL LOW (ref 12.0–15.0)
MCH: 28.4 pg (ref 26.0–34.0)
MCH: 28.6 pg (ref 26.0–34.0)
MCHC: 33.6 g/dL (ref 30.0–36.0)
MCHC: 33.9 g/dL (ref 30.0–36.0)
MCV: 84.5 fL (ref 78.0–100.0)
MCV: 84.4 fL (ref 78.0–100.0)
PLATELETS: 326 10*3/uL (ref 150–400)
Platelets: 301 10*3/uL (ref 150–400)
RBC: 4.01 MIL/uL (ref 3.87–5.11)
RBC: 3.98 MIL/uL (ref 3.87–5.11)
RDW: 13.6 % (ref 11.5–15.5)
RDW: 14 % (ref 11.5–15.5)
WBC: 7.2 10*3/uL (ref 4.0–10.5)
WBC: 4.8 10*3/uL (ref 4.0–10.5)

## 2017-07-15 LAB — ECHOCARDIOGRAM COMPLETE
AVLVOTPG: 7 mmHg
FS: 43 % (ref 28–44)
Height: 58 in
IVS/LV PW RATIO, ED: 1.09
LA ID, A-P, ES: 40 mm
LA diam end sys: 40 mm
LA diam index: 2.27 cm/m2
LA vol A4C: 76 ml
LA vol index: 48 mL/m2
LAVOL: 84.4 mL
LDCA: 3.14 cm2
LVOT VTI: 28 cm
LVOT peak vel: 128 cm/s
LVOTD: 20 mm
LVOTSV: 88 mL
PW: 10.8 mm — AB (ref 0.6–1.1)
RV TAPSE: 22.4 mm
Reg peak vel: 261 cm/s
TRMAXVEL: 261 cm/s
Weight: 2944 oz

## 2017-07-15 LAB — TSH: TSH: 1.987 u[IU]/mL (ref 0.350–4.500)

## 2017-07-15 LAB — PROTIME-INR
INR: 0.99
INR: 1.02
PROTHROMBIN TIME: 13 s (ref 11.4–15.2)
PROTHROMBIN TIME: 13.3 s (ref 11.4–15.2)

## 2017-07-15 LAB — TROPONIN I: TROPONIN I: 0.03 ng/mL — AB (ref <0.03)

## 2017-07-15 LAB — I-STAT TROPONIN, ED: TROPONIN I, POC: 0.04 ng/mL (ref 0.00–0.08)

## 2017-07-15 SURGERY — PACEMAKER IMPLANT
Anesthesia: LOCAL

## 2017-07-15 MED ORDER — SODIUM CHLORIDE 0.9% FLUSH: 3.0000 mL | INTRAVENOUS | Status: DC | PRN

## 2017-07-15 MED ORDER — CHLORHEXIDINE GLUCONATE 4 % EX LIQD: 60.0000 mL | Freq: Once | CUTANEOUS | Status: DC

## 2017-07-15 MED ORDER — CEFAZOLIN SODIUM-DEXTROSE 1-4 GM/50ML-% IV SOLN
1.0000 g | Freq: Four times a day (QID) | INTRAVENOUS | Status: AC
  Administered 2017-07-15 – 2017-07-16 (×3): 1 g via INTRAVENOUS
  Filled 2017-07-15 (×3): qty 50

## 2017-07-15 MED ORDER — FENTANYL CITRATE (PF) 100 MCG/2ML IJ SOLN
INTRAMUSCULAR | Status: DC | PRN
  Administered 2017-07-15: 25 ug via INTRAVENOUS

## 2017-07-15 MED ORDER — SODIUM CHLORIDE 0.9 % IV SOLN: 250.0000 mL | INTRAVENOUS | Status: DC

## 2017-07-15 MED ORDER — SODIUM CHLORIDE 0.9 % IR SOLN
Status: AC
  Filled 2017-07-15: qty 2

## 2017-07-15 MED ORDER — HYDRALAZINE HCL 20 MG/ML IJ SOLN: 10.0000 mg | Freq: Four times a day (QID) | INTRAMUSCULAR | Status: DC | PRN

## 2017-07-15 MED ORDER — CEFAZOLIN SODIUM-DEXTROSE 2-4 GM/100ML-% IV SOLN
2.0000 g | INTRAVENOUS | Status: AC
  Administered 2017-07-15: 2 g via INTRAVENOUS

## 2017-07-15 MED ORDER — SODIUM CHLORIDE 0.9% FLUSH
3.0000 mL | Freq: Two times a day (BID) | INTRAVENOUS | Status: DC
  Administered 2017-07-15: 3 mL via INTRAVENOUS

## 2017-07-15 MED ORDER — LIDOCAINE HCL (PF) 1 % IJ SOLN
INTRAMUSCULAR | Status: AC
  Filled 2017-07-15: qty 60

## 2017-07-15 MED ORDER — SODIUM CHLORIDE 0.9 % IV SOLN: INTRAVENOUS | Status: DC

## 2017-07-15 MED ORDER — HEPARIN (PORCINE) IN NACL 2-0.9 UNIT/ML-% IJ SOLN
INTRAMUSCULAR | Status: AC | PRN
  Administered 2017-07-15: 500 mL

## 2017-07-15 MED ORDER — CEFAZOLIN SODIUM-DEXTROSE 2-4 GM/100ML-% IV SOLN
INTRAVENOUS | Status: AC
  Filled 2017-07-15: qty 100

## 2017-07-15 MED ORDER — FENTANYL CITRATE (PF) 100 MCG/2ML IJ SOLN
INTRAMUSCULAR | Status: AC
  Filled 2017-07-15: qty 2

## 2017-07-15 MED ORDER — TRAMADOL HCL 50 MG PO TABS
50.0000 mg | ORAL_TABLET | Freq: Four times a day (QID) | ORAL | Status: DC | PRN
  Administered 2017-07-15 – 2017-07-16 (×2): 50 mg via ORAL
  Filled 2017-07-15 (×2): qty 1

## 2017-07-15 MED ORDER — SODIUM CHLORIDE 0.9 % IR SOLN
80.0000 mg | Status: AC
  Administered 2017-07-15: 80 mg

## 2017-07-15 MED ORDER — SODIUM CHLORIDE 0.9% FLUSH: 3.0000 mL | Freq: Two times a day (BID) | INTRAVENOUS | Status: DC

## 2017-07-15 MED ORDER — ACETAMINOPHEN 325 MG PO TABS
325.0000 mg | ORAL_TABLET | ORAL | Status: DC | PRN
  Administered 2017-07-15: 650 mg via ORAL
  Filled 2017-07-15: qty 2

## 2017-07-15 MED ORDER — HEPARIN (PORCINE) IN NACL 2-0.9 UNIT/ML-% IJ SOLN
INTRAMUSCULAR | Status: AC
  Filled 2017-07-15: qty 500

## 2017-07-15 MED ORDER — LIDOCAINE HCL (PF) 1 % IJ SOLN
INTRAMUSCULAR | Status: DC | PRN
  Administered 2017-07-15: 30 mL via SUBCUTANEOUS

## 2017-07-15 MED ORDER — ONDANSETRON HCL 4 MG/2ML IJ SOLN
4.0000 mg | Freq: Four times a day (QID) | INTRAMUSCULAR | Status: DC | PRN
Start: 2017-07-15 — End: 2017-07-16

## 2017-07-15 SURGICAL SUPPLY — 10 items
CABLE SURGICAL S-101-97-12 (CABLE) ×2 IMPLANT
HOVERMATT SINGLE USE (MISCELLANEOUS) ×2 IMPLANT
IPG PACE AZUR XT DR MRI W1DR01 (Pacemaker) ×1 IMPLANT
KIT ESSENTIALS PG (KITS) IMPLANT
LEAD CAPSURE NOVUS 45CM (Lead) ×2 IMPLANT
LEAD CAPSURE NOVUS 5076-52CM (Lead) ×2 IMPLANT
PACE AZURE XT DR MRI W1DR01 (Pacemaker) ×2 IMPLANT
PAD DEFIB LIFELINK (PAD) ×2 IMPLANT
SHEATH CLASSIC 7F (SHEATH) ×4 IMPLANT
TRAY PACEMAKER INSERTION (PACKS) ×2 IMPLANT

## 2017-07-15 NOTE — ED Notes (Signed)
Pt sent from home-- was called by cardiology to come in due to heart block.

## 2017-07-15 NOTE — Discharge Instructions (Signed)
° ° °  Supplemental Discharge Instructions for  Pacemaker/Defibrillator Patients  Activity No heavy lifting or vigorous activity with your left/right arm for 6 to 8 weeks.  Do not raise your left/right arm above your head for one week.  Gradually raise your affected arm as drawn below.             07/19/17                     07/20/17                     07/21/17                   07/22/17 __  NO DRIVING for 1 week  ; you may begin driving on   03/08/68 .  WOUND CARE - Keep the wound area clean and dry.  Do not get this area wet, no showers until cleared at your wound check visit . - The tape/steri-strips on your wound will fall off; do not pull them off.  No bandage is needed on the site.  DO  NOT apply any creams, oils, or ointments to the wound area. - If you notice any drainage or discharge from the wound, any swelling or bruising at the site, or you develop a fever > 101? F after you are discharged home, call the office at once.  Special Instructions - You are still able to use cellular telephones; use the ear opposite the side where you have your pacemaker/defibrillator.  Avoid carrying your cellular phone near your device. - When traveling through airports, show security personnel your identification card to avoid being screened in the metal detectors.  Ask the security personnel to use the hand wand. - Avoid arc welding equipment, MRI testing (magnetic resonance imaging), TENS units (transcutaneous nerve stimulators).  Call the office for questions about other devices. - Avoid electrical appliances that are in poor condition or are not properly grounded. - Microwave ovens are safe to be near or to operate.  Additional information for defibrillator patients should your device go off: - If your device goes off ONCE and you feel fine afterward, notify the device clinic nurses. - If your device goes off ONCE and you do not feel well afterward, call 911. - If your device goes off TWICE, call  911. - If your device goes off THREE times in one day, call 911.  DO NOT DRIVE YOURSELF OR A FAMILY MEMBER WITH A DEFIBRILLATOR TO THE HOSPITAL--CALL 911.

## 2017-07-15 NOTE — Progress Notes (Signed)
Received patient from cath lab s/p PPM placement. Patient stable on arrival. Vital signs stable, assessment in progress.

## 2017-07-15 NOTE — ED Triage Notes (Addendum)
Patient and family report that they were called this am and told to come to ED for pacemaker placement today. Patient alert and oriented, denies pain. Reports that she has had weakness x 3 weeks and the tests over weekend show she needs pacemaker. Reports that she had a chest discomfort /indigestion feeling last night

## 2017-07-15 NOTE — Consult Note (Signed)
Cardiology Consultation:   Patient ID: Holly [name]; [id number]; 1940/11/07   Admit date: 07/15/2017 Date of Consult: 07/15/2017  Primary Care Provider: Patient, No Pcp Per Primary Cardiologist: Dr. Virgina Jock  Patient Profile:   Holly [name] is a 76 y.o. 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 Rhodia Albright.  History of Present Illness:   Holly [name] has been having some weak spells, and "funny" feeling in her head, was st the ER 07/09/17 with lightheaded event at church she has not fainted.  No CP or palpitations.   She was followed with her cardiologist who had her wear a monitor that disclosed prolonged periods of V standstill up to 5 and 6 seconds and called to come in via ER, she arrived here in 2:1 block with periods of CHB as well, V rates generally in 40's  LABS  K+ 4.5 BUN/Creat 16/1.27 WBC 7.2 H/H 11/33 plts 326  She is asymptomatic here supine, she has chronic arthritic pains, no CP, on/off GERD/epigastric discomfort, no SOB.     Past Medical History:  Diagnosis Date  . Bronchitis   . Hypertension     Past Surgical History:  Procedure Laterality Date  . left leg surgery       Home Medications:  Prior to Admission medications   Medication Sig Start Date End Date Taking? Authorizing Provider  acetaminophen-codeine (TYLENOL #3) 300-30 MG tablet Take 1 tablet by mouth 2 (two) times daily as needed for pain. 05/14/17   [provider]  amLODipine (NORVASC) 5 MG tablet Take 5 mg by mouth daily. 07/08/17   [provider]  Calcium Carb-Cholecalciferol (CALCIUM 600 + D) 600-200 MG-UNIT TABS Take 1 tablet by mouth daily.    [provider]  cetirizine (ZYRTEC) 10 MG tablet Take 10 mg by mouth daily as needed. 03/06/17   [provider]  clotrimazole-betamethasone (LOTRISONE) cream Apply 1 application topically 2 (two) times daily.  01/27/17   [provider]  diclofenac sodium (VOLTAREN) 1 % GEL Apply 4 g topically 4 (four) times daily. To shoulder 08/07/16   Billy Fischer, MD  hydroxypropyl methylcellulose (ISOPTO TEARS) 2.5 % ophthalmic solution Place 1 drop into both eyes 3 (three) times daily as needed for dry eyes.    [provider]  ibuprofen (ADVIL,MOTRIN) 600 MG tablet Take 600 mg by mouth every 8 (eight) hours as needed for mild pain.  02/03/14   [provider]  irbesartan (AVAPRO) 300 MG tablet Take 300 mg by mouth daily.  04/02/14   [provider]  PATADAY 0.2 % SOLN Apply 1 drop to eye daily.  02/24/17   [provider]  Probiotic Product (PROBIOTIC PO) Take 1 capsule by mouth daily.    [provider]  RABEprazole (ACIPHEX) 20 MG tablet Take 20 mg by mouth daily as needed (acid reflux).  04/04/14   [provider]  triamterene-hydrochlorothiazide (MAXZIDE-25) 37.5-25 MG tablet Take 1 tablet by mouth daily.  12/31/16   [provider]  vitamin B-12 (CYANOCOBALAMIN) 100 MCG tablet Take 100 mcg by mouth daily.    [provider]    Inpatient Medications: Scheduled Meds: . sodium chloride flush  3 mL Intravenous Q12H   Continuous Infusions: . sodium chloride     PRN Meds:   Allergies:    Allergies  Allergen Reactions  . Lisinopril Cough  . Venlafaxine Rash  Social History:   Social History   Social History  . Marital status: Married    Spouse name: N/A  . Number of children: N/A  . Years of education: N/A   Occupational History  . Not on file.   Social History Main Topics  . Smoking status: Never Smoker  . Smokeless tobacco: Never Used  . Alcohol use No  . Drug use: Unknown  . Sexual activity: Not on file   Other Topics Concern  . Not on file   Social History Narrative  . No narrative on file    Family History:    Family History  Problem Relation Age of Onset  . Hypertension Mother   . ALS Sister   . Heart attack Brother   .  Diabetes Daughter      ROS:  Please see the history of present illness.  Review of Systems  Hematologic/Lymphatic: Positive for bleeding problem.    All other ROS reviewed and negative.     Physical Exam/Data:   Vitals:   07/15/17 1110  BP: (!) 158/57  Pulse: (!) 46  Resp: 16  Temp: 98.1 F (36.7 C)  TempSrc: Oral  SpO2: 100%  Weight: 184 lb (83.5 kg)  Height: 4\' 10"  (1.473 m)   No intake or output data in the 24 hours ending 07/15/17 1233 Filed Weights   07/15/17 1110  Weight: 184 lb (83.5 kg)   Body mass index is 38.46 kg/m.  General:  Well nourished, well developed, in no acute distress HEENT: normal Lymph: no adenopathy Neck: no JVD Endocrine:  No thryomegaly Vascular: No carotid bruits Cardiac:  RRR; bradycardiac,no murmurs, gallops or rubs are appreciated Lungs:  CTA b/l, no wheezing, rhonchi or rales  Abd: soft, nontender Ext: no edema Musculoskeletal:  No deformities, BUE and BLE strength normal and equal Skin: warm and dry  Neuro:  no focal abnormalities noted Psych:  Normal affect   EKG:  The EKG was personally reviewed and demonstrates:    CHB, RBBB, 46bpm Telemetry:  Telemetry was personally reviewed and demonstrates:  variable heart block, Mobitz 1 to CHB, V rates 40s  Relevant CV Studies:  Stat echo is ordered  Laboratory Data:  Chemistry  Recent Labs Lab 07/08/17 2217 07/15/17 1115  NA 136 128*  K 4.2 4.5  CL 107 100*  CO2 21* 19*  GLUCOSE 105* 122*  BUN 22* 16  CREATININE 1.08* 1.27*  CALCIUM 9.4 10.4*  GFRNONAA 48* 40*  GFRAA 56* 46*  ANIONGAP 8 9     Recent Labs Lab 07/08/17 2217  PROT 6.9  ALBUMIN 3.5  AST 28  ALT 13*  ALKPHOS 57  BILITOT 0.7   Hematology  Recent Labs Lab 07/08/17 2217 07/15/17 1115  WBC 5.5 7.2  RBC 3.88 4.01  HGB 11.1* 11.4*  HCT 33.5* 33.9*  MCV 86.3 84.5  MCH 28.6 28.4  MCHC 33.1 33.6  RDW 14.2 13.6  PLT 272 326   Cardiac Enzymes  Recent Labs Lab 07/08/17 2217  TROPONINI  <0.03     Recent Labs Lab 07/15/17 1126  TROPIPOC 0.04    BNPNo results for input(s): BNP, PROBNP in the last 168 hours.  DDimer No results for input(s): DDIMER in the last 168 hours.  Radiology/Studies:  Dg Chest Port 1 View Result Date: 07/15/2017 CLINICAL DATA:  Preop for pacemaker placement. EXAM: PORTABLE CHEST 1 VIEW COMPARISON:  Chest CT 07/09/2017 FINDINGS: The heart is upper limits of normal in size given the AP projection and  semi upright position of the patient. There is moderate tortuosity of the thoracic aorta. The pulmonary hilum are grossly normal. Low lung volumes with streaky basilar atelectasis but no infiltrates, edema or effusions. Advanced degenerative changes involving both shoulders is noted. IMPRESSION: No acute cardiopulmonary findings. Electronically Signed   By: Marijo Sanes M.D.   On: 07/15/2017 12:29    Assessment and Plan:   1. CHB     Home meds reviewed, no nodal blocking agents, no reversible causes noted     Dr. Curt Bears has seen/examined the patient, discussed with her PPM procedure, risks and benefits, she is agreeable to proceed  Await her echo    For questions or updates, please contact [facility] Please consult [email or web address] for contact info under Cardiology/STEMI.   Signed, Baldwin Jamaica, PA-C  07/15/2017 12:33 PM  I have seen and examined this patient with Rise Mu.  Agree with above, note added to reflect my findings.  On exam, bradycardic, no murmurs, lungs clear. Presented to the hospital after being found to have high grade heart block on monitor. Has had mobitz I, II, and complete AV block since presentation. Outpatient monitor showed V standstill for up to 6 seconds. Plan for pacemaker implant. Risks and benefits discussed. Risks include but not limited to bleeding, infection, tamponade, pneumothorax. She understands the risks and has agreed to the procedure.    Atiana Levier M. Yulian Gosney MD 07/15/2017 1:05 PM

## 2017-07-15 NOTE — ED Notes (Signed)
[facility]geon is aware of pt being in the ER and says she'll probably need a pacemaker. He requests when pt has a room, to call him on his cell @ [phone].

## 2017-07-15 NOTE — Progress Notes (Signed)
  Echocardiogram 2D Echocardiogram has been performed.  Jennette Dubin 07/15/2017, 1:22 PM

## 2017-07-15 NOTE — H&P (Signed)
Holly [name] is an 76 y.o. female.    Chief Complaint: Lightheadedness, intermittent third degree AV block  HPI:   76 year old African-American female being admitted for intermittent third-degree AV block. I saw the patient in clinic on 07/10/2017. She was referred to me by Dr. Ashby [name] for episodes of lightheadedness. She has benign essential hypertension, GERD. She also had concerns about the central pressure which was not always exertional. 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 Given the concern for significant conduction abnormalities, I had placed a Holter monitor. Holter monitor showed long episodes of asystole ranging from 3.7 seconds during daytime to 6.2 seconds at night. Her minimum heart rate was 32. Given her findings she was asked to get admitted for possible pacemaker placement.   She denies any lightheadedness at this time. She had one episode of abdominal pain after eating last night. Shortness of breath is at baseline.  Past Medical History:  Diagnosis Date  . Bronchitis   . Hypertension     Past Surgical History:  Procedure Laterality Date  . left leg surgery      No family history on file. Social History:  reports that she has never smoked. She has never used smokeless tobacco. She reports that she does not drink alcohol. Her drug history is not on file.  Allergies:  Allergies  Allergen Reactions  . Lisinopril Cough  . Venlafaxine Rash     (Not in a hospital admission)  Results for orders placed or performed during the hospital encounter of 07/15/17 (from the past 48 hour(s))  CBC     Status: Abnormal   Collection Time: 07/15/17 11:15 AM  Result Value Ref Range   WBC 7.2 4.0 - 10.5 K/uL   RBC 4.01 3.87 - 5.11 MIL/uL   Hemoglobin 11.4 (L) 12.0 - 15.0 g/dL   HCT 33.9 (L) 36.0 - 46.0 %   MCV 84.5 78.0 - 100.0 fL   MCH 28.4 26.0 - 34.0 pg   MCHC 33.6 30.0 -  36.0 g/dL   RDW 13.6 11.5 - 15.5 %   Platelets 326 150 - 400 K/uL  Protime-INR (order if Patient is taking Coumadin / Warfarin)     Status: None   Collection Time: 07/15/17 11:15 AM  Result Value Ref Range   Prothrombin Time 13.0 11.4 - 15.2 seconds   INR 0.99   I-stat troponin, ED     Status: None   Collection Time: 07/15/17 11:26 AM  Result Value Ref Range   Troponin i, poc 0.04 0.00 - 0.08 ng/mL   Comment 3            Comment: Due to the release kinetics of cTnI, a negative result within the first hours of the onset of symptoms does not rule out myocardial infarction with certainty. If myocardial infarction is still suspected, repeat the test at appropriate intervals.    No results found.  Review of Systems  Constitutional: Positive for malaise/fatigue.  HENT: Negative.   Eyes: Negative for blurred vision.  Respiratory: Positive for shortness of breath. Negative for cough.   Cardiovascular: Negative for palpitations.  Gastrointestinal: Positive for abdominal pain (After eating).  Genitourinary: Negative.   Musculoskeletal: Negative.   Skin: Negative.   Neurological: Positive for headaches. Negative for loss of consciousness.       Prior pre-syncopal episodes   All other systems reviewed and are negative.   Blood pressure (!) 158/57, pulse Marland Kitchen)  46, temperature 98.1 F (36.7 C), temperature source Oral, resp. rate 16, height 4\' 10"  (1.473 m), weight 83.5 kg (184 lb), SpO2 100 %. Physical Exam  Nursing note and vitals reviewed. Constitutional: She is oriented to person, place, and time. She appears well-developed and well-nourished.  HENT:  Head: Normocephalic and atraumatic.  Neck: No JVD present.  Prominent right carotid pulsations   Cardiovascular: Regular rhythm.   Murmur (II/VI late systolic murmur aortic area) heard. Bradycardia   Respiratory: She has no wheezes. She has no rales.  GI: Soft. Bowel sounds are normal. She exhibits distension.   Musculoskeletal: She exhibits no edema.  Neurological: She is alert and oriented to person, place, and time.  Skin: Skin is warm and dry.     Assessment/Plan 76 year old African-American female being admitted for intermittent third-degree AV block HTN, GERD  Intermittent third degree AV block on Holter monitor with 6.2 sec asystole during sleep, 3.7 sec during daytime Prior episodes of pre-syncope I have discussed with EP Dr. Rayann [name] and Dr. Curt [name] who have graciously agreed to see the patient for possible pacemaker. Stat echocardiogram. May get outpatient stress test  Holly Mormon, MD 07/15/2017, 12:10 PM  Holly Sedeno Esther Hardy, MD [facility] Pager: [phone] Office: [phone] If no answer Cell [phone]

## 2017-07-15 NOTE — ED Notes (Signed)
Last ate -- applesauce at 0630.

## 2017-07-15 NOTE — ED Provider Notes (Signed)
[facility] DEPT Provider Note   CSN: [id number] Arrival date & time: 07/15/17  1103     History   Chief Complaint Chief Complaint  Patient presents with  . sent for pacemaker     HPI   Blood pressure (!) 158/57, pulse (!) 46, temperature 98.1 F (36.7 C), temperature source Oral, resp. rate 16, height 4\' 10"  (1.473 m), weight 83.5 kg (184 lb), SpO2 100 %.  Holly [name] is a 76 y.o. female with past medical history significant for hypertension advised to present to the ED for possible pacemaker placement. Patient  placed on Holter monitor on Friday, her daughter disconnected the Holter monitor on Saturday, returned it to office on Monday and was advised to present today for pacemaker placement. She has no complaints at this time. She states that initially before the workup was initiated she was feeling lightheadedand dizzy, there was no syncopal events. She denies any lightheadedness, chest pain, shortness of breath or increasing peripheral edema at this time. She last had applesauce with morning meds at 6 AM. Advised to remain nothing by mouth.  Past Medical History:  Diagnosis Date  . Bronchitis   . Hypertension     Patient Active Problem List   Diagnosis Date Noted  . 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 Carpal tunnel syndrome 08/09/2013    Past Surgical History:  Procedure Laterality Date  . left leg surgery      OB History    No data available       Home Medications    Prior to Admission medications   Medication Sig Start Date End Date Taking? Authorizing Provider  acetaminophen-codeine (TYLENOL #3) 300-30 MG tablet Take 1 tablet by mouth 2 (two) times daily as needed for pain. 05/14/17   [provider]  amLODipine (NORVASC) 5 MG tablet Take 5 mg by mouth daily. 07/08/17   [provider]  Calcium Carb-Cholecalciferol (CALCIUM 600 + D) 600-200 MG-UNIT TABS Take 1 tablet  by mouth daily.    [provider]  cetirizine (ZYRTEC) 10 MG tablet Take 10 mg by mouth daily as needed. 03/06/17   [provider]  clotrimazole-betamethasone (LOTRISONE) cream Apply 1 application topically 2 (two) times daily.  01/27/17   [provider]  diclofenac sodium (VOLTAREN) 1 % GEL Apply 4 g topically 4 (four) times daily. To shoulder 08/07/16   Billy Fischer, MD  hydroxypropyl methylcellulose (ISOPTO TEARS) 2.5 % ophthalmic solution Place 1 drop into both eyes 3 (three) times daily as needed for dry eyes.    [provider]  ibuprofen (ADVIL,MOTRIN) 600 MG tablet Take 600 mg by mouth every 8 (eight) hours as needed for mild pain.  02/03/14   [provider]  irbesartan (AVAPRO) 300 MG tablet Take 300 mg by mouth daily.  04/02/14   [provider]  PATADAY 0.2 % SOLN Apply 1 drop to eye daily.  02/24/17   [provider]  Probiotic Product (PROBIOTIC PO) Take 1 capsule by mouth daily.    [provider]  RABEprazole (ACIPHEX) 20 MG tablet Take 20 mg by mouth daily as needed (acid reflux).  04/04/14   [provider]  triamterene-hydrochlorothiazide (MAXZIDE-25) 37.5-25 MG tablet Take 1 tablet by mouth daily.  12/31/16   [provider]  vitamin B-12 (CYANOCOBALAMIN) 100 MCG tablet Take 100 mcg by mouth daily.    [provider]    Family History No family history on  file.  Social History Social History  Substance Use Topics  . Smoking status: Never Smoker  . Smokeless tobacco: Never Used  . Alcohol use No     Allergies   Lisinopril and Venlafaxine   Review of Systems Review of Systems  A complete review of systems was obtained and all systems are negative except as noted in the HPI and PMH.   Physical Exam Updated Vital Signs BP (!) 158/57 (BP Location: Right Arm)   Pulse (!) 46   Temp 98.1 F (36.7 C) (Oral)   Resp 16   Ht 4\' 10"  (1.473 m)   Wt 83.5 kg (184 lb)   SpO2  100%   BMI 38.46 kg/m   Physical Exam  Constitutional: She is oriented to person, place, and time. She appears well-developed and well-nourished. No distress.  HENT:  Head: Normocephalic.  Mouth/Throat: Oropharynx is clear and moist.  Eyes: Conjunctivae are normal.  Neck: Normal range of motion. No JVD present. No tracheal deviation present.  Cardiovascular: Normal rate, regular rhythm and intact distal pulses.   Mildly brady  Pulmonary/Chest: Effort normal and breath sounds normal. No stridor. No respiratory distress. She has no wheezes. She has no rales. She exhibits no tenderness.  Abdominal: Soft. She exhibits no distension and no mass. There is no tenderness. There is no rebound and no guarding.  Musculoskeletal: Normal range of motion. She exhibits no edema or tenderness.  No calf asymmetry, superficial collaterals, palpable cords, edema, Homans sign negative bilaterally.    Neurological: She is alert and oriented to person, place, and time.  Skin: Skin is warm. She is not diaphoretic.  Psychiatric: She has a normal mood and affect.  Nursing note and vitals reviewed.    ED Treatments / Results  Labs (all labs ordered are listed, but only abnormal results are displayed) Labs Reviewed  CBC - Abnormal; Notable for the following:       Result Value   Hemoglobin 11.4 (*)    HCT 33.9 (*)    All other components within normal limits  PROTIME-INR  BASIC METABOLIC PANEL  I-STAT TROPONIN, ED    EKG  EKG Interpretation None       Radiology No results found.  Procedures Procedures (including critical care time)  CRITICAL CARE Performed by: Monico Blitz   Total critical care time: 35 minutes  Critical care time was exclusive of separately billable procedures and treating other patients.  Critical care was necessary to treat or prevent imminent or life-threatening deterioration.  Critical care was time spent personally by me on the following activities:  development of treatment plan with patient and/or surrogate as well as nursing, discussions with consultants, evaluation of patient's response to treatment, examination of patient, obtaining history from patient or surrogate, ordering and performing treatments and interventions, ordering and review of laboratory studies, ordering and review of radiographic studies, pulse oximetry and re-evaluation of patient's condition.   Medications Ordered in ED Medications - No data to display   Initial Impression / Assessment and Plan / ED Course  I have reviewed the triage vital signs and the nursing notes.  Pertinent labs & imaging results that were available during my care of the patient were reviewed by me and considered in my medical decision making (see chart for details).     Vitals:   07/15/17 1110  BP: (!) 158/57  Pulse: (!) 46  Resp: 16  Temp: 98.1 F (36.7 C)  TempSrc: Oral  SpO2: 100%  Weight: 83.5 kg (  184 lb)  Height: 4\' 10"  (1.473 m)    Medications  0.9 %  sodium chloride infusion (not administered)    Holly [name] is 76 y.o. female presenting for pacemaker. Complete heart block on EKG. Patient with bradycardia, no complaints. She was feeling lightheaded which prompted this evaluation. Discussed with Dr. Virgina Jock, who has evaluated the patient and will admit.   Patient asymptomatic, advised remain nothing by mouth. Preop blood work, chest x-ray without significant abnormality.    Final Clinical Impressions(s) / ED Diagnoses   Final diagnoses:  Complete AV block [facility])    New Prescriptions New Prescriptions   No medications on file     Waynetta Pean 07/15/17 [place], MD 07/15/17 1644

## 2017-07-15 NOTE — ED Provider Notes (Signed)
Patient noted to have complete heart block on outpatient heart monitor. Told to come to the emergency department for pacemaker insertion. Patient is asymptomatic on exam she is alert and in no distress heart bradycardic regular rhythm lungs clear auscultation   Orlie Dakin, MD 07/15/17 1342

## 2017-07-16 ENCOUNTER — Encounter (HOSPITAL_COMMUNITY): Payer: Self-pay | Admitting: Cardiology

## 2017-07-16 ENCOUNTER — Inpatient Hospital Stay (HOSPITAL_COMMUNITY): Payer: PPO

## 2017-07-16 LAB — BASIC METABOLIC PANEL
Anion gap: 6 (ref 5–15)
BUN: 11 mg/dL (ref 6–20)
CHLORIDE: 103 mmol/L (ref 101–111)
CO2: 20 mmol/L — ABNORMAL LOW (ref 22–32)
CREATININE: 1.02 mg/dL — AB (ref 0.44–1.00)
Calcium: 9.3 mg/dL (ref 8.9–10.3)
GFR, EST AFRICAN AMERICAN: 60 mL/min — AB (ref >60)
GFR, EST NON AFRICAN AMERICAN: 52 mL/min — AB (ref >60)
Glucose, Bld: 93 mg/dL (ref 65–99)
Potassium: 4.3 mmol/L (ref 3.5–5.1)
SODIUM: 129 mmol/L — AB (ref 135–145)

## 2017-07-16 LAB — GLUCOSE, CAPILLARY: GLUCOSE-CAPILLARY: 88 mg/dL (ref 65–99)

## 2017-07-16 LAB — CBC
HCT: 31.8 % — ABNORMAL LOW (ref 36.0–46.0)
Hemoglobin: 10.6 g/dL — ABNORMAL LOW (ref 12.0–15.0)
MCH: 28.3 pg (ref 26.0–34.0)
MCHC: 33.3 g/dL (ref 30.0–36.0)
MCV: 85 fL (ref 78.0–100.0)
PLATELETS: 252 10*3/uL (ref 150–400)
RBC: 3.74 MIL/uL — AB (ref 3.87–5.11)
RDW: 13.7 % (ref 11.5–15.5)
WBC: 5.7 10*3/uL (ref 4.0–10.5)

## 2017-07-16 MED ORDER — METOPROLOL SUCCINATE ER 25 MG PO TB24: 25.0000 mg | ORAL_TABLET | Freq: Every day | ORAL | 3 refills | Status: DC

## 2017-07-16 MED ORDER — TRAMADOL HCL 50 MG PO TABS: 50.0000 mg | ORAL_TABLET | Freq: Three times a day (TID) | ORAL | 0 refills | Status: AC | PRN

## 2017-07-16 MED ORDER — METOPROLOL SUCCINATE ER 25 MG PO TB24
25.0000 mg | ORAL_TABLET | Freq: Every day | ORAL | Status: DC
  Administered 2017-07-16: 25 mg via ORAL
  Filled 2017-07-16: qty 1

## 2017-07-16 MED ORDER — ROSUVASTATIN CALCIUM 10 MG PO TABS
10.0000 mg | ORAL_TABLET | Freq: Every day | ORAL | Status: DC
  Administered 2017-07-16: 10 mg via ORAL
  Filled 2017-07-16: qty 1

## 2017-07-16 MED FILL — Sodium Chloride Irrigation Soln 0.9%: Qty: 500 | Status: AC

## 2017-07-16 MED FILL — Gentamicin Sulfate Inj 40 MG/ML: INTRAMUSCULAR | Qty: 2 | Status: AC

## 2017-07-16 NOTE — Progress Notes (Signed)
Progress Note  Patient Name: Holly [name] Date of Encounter: 07/16/2017  Primary Cardiologist: Dr. Virgina Jock  Subjective   Mild tenderness at pacer site, no CP,palpitations, or SOB  Inpatient Medications    Scheduled Meds: . sodium chloride flush  3 mL Intravenous Q12H   Continuous Infusions: . sodium chloride    .  ceFAZolin (ANCEF) IV Stopped (07/16/17 0342)   PRN Meds: acetaminophen, hydrALAZINE, ondansetron (ZOFRAN) IV, traMADol   Vital Signs    Vitals:   07/15/17 2151 07/16/17 0007 07/16/17 0220 07/16/17 0446  BP: 125/63 131/63 116/62 117/61  Pulse: 76 69 73 68  Resp: 20 18 20 18   Temp: 98.2 F (36.8 C) 97.9 F (36.6 C) 98.4 F (36.9 C) 98.3 F (36.8 C)  TempSrc: Oral Oral Oral Oral  SpO2: 99% 96% 98% 96%  Weight:    178 lb (80.7 kg)  Height:        Intake/Output Summary (Last 24 hours) at 07/16/17 0740 Last data filed at 07/16/17 0536  Gross per 24 hour  Intake              340 ml  Output             1250 ml  Net             -910 ml   Filed Weights   07/15/17 1110 07/16/17 0446  Weight: 184 lb (83.5 kg) 178 lb (80.7 kg)    Telemetry    SR/ Vpaced - Personally Reviewed  ECG    SR/V paced - Personally Reviewed  Physical Exam   GEN: No acute distress.   Neck: No JVD Cardiac: RRR, no murmurs, rubs, or gallops.  Respiratory: CTA b/l. GI: Soft, nontender, non-distended  MS: No edema; No deformity. Neuro:  Nonfocal  Psych: Normal affect   L chest/PPM site is dry, no hematoma, no ecchymosis  Labs    Chemistry Recent Labs Lab 07/15/17 1115 07/15/17 1350 07/16/17 0449  NA 128* 129* 129*  K 4.5 4.1 4.3  CL 100* 102 103  CO2 19* 17* 20*  GLUCOSE 122* 102* 93  BUN 16 15 11   CREATININE 1.27* 1.18* 1.02*  CALCIUM 10.4* 10.4* 9.3  PROT  --  7.8  --   ALBUMIN  --  3.8  --   AST  --  17  --   ALT  --  11*  --   ALKPHOS  --  57  --   BILITOT  --  0.6  --   GFRNONAA 40* 43* 52*  GFRAA 46* 50* 60*  ANIONGAP 9 10 6       Hematology Recent Labs Lab 07/15/17 1115 07/15/17 1350 07/16/17 0449  WBC 7.2 4.8 5.7  RBC 4.01 3.98 3.74*  HGB 11.4* 11.4* 10.6*  HCT 33.9* 33.6* 31.8*  MCV 84.5 84.4 85.0  MCH 28.4 28.6 28.3  MCHC 33.6 33.9 33.3  RDW 13.6 14.0 13.7  PLT 326 301 252    Cardiac Enzymes Recent Labs Lab 07/15/17 1350  TROPONINI 0.03*    Recent Labs Lab 07/15/17 1126  TROPIPOC 0.04     BNPNo results for input(s): BNP, PROBNP in the last 168 hours.   DDimer No results for input(s): DDIMER in the last 168 hours.   Radiology     Dg Chest Port 1 View Result Date: 07/15/2017 CLINICAL DATA:  Preop for pacemaker placement. EXAM: PORTABLE CHEST 1 VIEW COMPARISON:  Chest CT 07/09/2017 FINDINGS: The heart is upper limits of normal in  size given the AP projection and semi upright position of the patient. There is moderate tortuosity of the thoracic aorta. The pulmonary hilum are grossly normal. Low lung volumes with streaky basilar atelectasis but no infiltrates, edema or effusions. Advanced degenerative changes involving both shoulders is noted. IMPRESSION: No acute cardiopulmonary findings. Electronically Signed   By: Marijo Sanes M.D.   On: 07/15/2017 12:29    Cardiac Studies   07/15/17 TTE Study Conclusions - Left ventricle: The cavity size was normal. Systolic function was   vigorous. The estimated ejection fraction was in the range of 65%   to 70%. Wall motion was normal; there were no regional wall   motion abnormalities. The study is not technically sufficient to   allow evaluation of LV diastolic function. - Left atrium: The atrium was mildly dilated. - Atrial septum: No defect or patent foramen ovale was identified. Impressions: - Normal pulmonary artery pressure.  Patient Profile     76 y.o. female with recent hx of weak episodes, near syncope,found with high degree AVblock, V standstill on an EM out patient, now s/p PPM  Assessment & Plan    1. CHB     S/p PPM yesterday  by Dr. Curt Bears     Pacer check this AM is with stable measurements and function     patient is pacer dependent this AM     CXR is without ptx     Wound care and activity restrictions were discussed with the patient and daughter at bedside     EP f/u has been arranged  Further as per Dr. Virgina Jock EP service remains available, please recall if needed    For questions or updates, please contact [facility] Please consult [email or web address] for contact info under Cardiology/STEMI.      Signed, Baldwin Jamaica, PA-C  07/16/2017, 7:40 AM    I have seen and examined this patient with Tommye Standard.  Agree with above, note added to reflect my findings.  On exam, RRR, no murmurs, lungs clear. Presented to the hospital yesterday after her episodes of syncope and heart block noted on cardiac monitor. 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 Device interrogation and chest x-ray without major abnormality. Plan for follow-up in device clinic in 10-14 days.  Kaeley Vinje M. Riva Sesma MD 07/16/2017 8:15 AM

## 2017-07-16 NOTE — Progress Notes (Signed)
Patient slept well throughout the night, had pain at incision site which decreased after pain medication.

## 2017-07-16 NOTE — Evaluation (Addendum)
Occupational Therapy Evaluation Patient Details Name: Holly [name] MRN: [id number] DOB: January 15, 1941 Today's Date: 07/16/2017    History of Present Illness Pt is a 76 y.o. female admitted for intermittent third-degree AV block. Now s/p pacemaker placement 07/15/17. PMH significant for essential hypertension, and GERD.   Clinical Impression   PTA, pt was independent with ADL and functional mobility. Limited OT evaluation to bed level as pt with lightheadedness, chest pain, and nausea with RN aware. Pt currently requires min assist for UB ADL and max assist for LB ADL. Did observe pt returning to bed with CNA with min assist prior to procedure before able to initiate evaluation. Pt and daughter educated concerning L UE mobility progression post-pacemaker placement and handout provided. They verbalize understanding. At current functional level, pt will need 24 hour hands on assistance from family and they report ability to provide this. Recommend home health OT follow-up post-acute D/C. Will continue to follow while admitted and update recommendations as appropriate.    Follow Up Recommendations  Supervision/Assistance - 24 hour;Home health OT    Equipment Recommendations  None recommended by OT    Recommendations for Other Services       Precautions / Restrictions Restrictions Weight Bearing Restrictions: No LUE Weight Bearing: Non weight bearing      Mobility Bed Mobility                  Transfers                 General transfer comment: Observed pt returning to bed with assistance from CNA with min assist for stand-pivot.     Balance                                           ADL either performed or assessed with clinical judgement   ADL Overall ADL's : Needs assistance/impaired Eating/Feeding: Set up;Bed level   Grooming: Set up;Bed level   Upper Body Bathing: Minimal assistance;Bed level   Lower Body Bathing: Maximal assistance;Bed  level   Upper Body Dressing : Minimal assistance;Bed level   Lower Body Dressing: Maximal assistance;Bed level     Toilet Transfer Details (indicate cue type and reason): Pt with chest pain and nausea on OT arrival and unable to progress OOB. Did observe pt returning to bed with CNA requiring min assist for stand-pivot.           General ADL Comments: Pt limited this session by chest pain and nausea. RN aware. Pt and family educated concerning post pacemaker placement precautions.      Vision Baseline Vision/History: No visual deficits (has had lazer surgery) Patient Visual Report: No change from baseline Vision Assessment?: No apparent visual deficits     Perception     Praxis      Pertinent Vitals/Pain Pain Assessment: No/denies pain     Hand Dominance Right   Extremity/Trunk Assessment Upper Extremity Assessment Upper Extremity Assessment: Generalized weakness           Communication Communication Communication: No difficulties   Cognition Arousal/Alertness: Awake/alert Behavior During Therapy: WFL for tasks assessed/performed Overall Cognitive Status: Within Functional Limits for tasks assessed                                     General Comments  Exercises     Shoulder Instructions      Home Living Family/patient expects to be discharged to:: Private residence Living Arrangements: Children;Other relatives Available Help at Discharge: Family;Available PRN/intermittently Type of Home: House Home Access: Stairs to enter [employer] of Steps: 2 on back; 4-5 in front Entrance Stairs-Rails: Right;Left;Can reach both Home Layout: One level     Bathroom Shower/Tub: Teacher, early years/pre: Standard     Home Equipment: Environmental consultant - 2 wheels;Cane - single point          Prior Functioning/Environment Level of Independence: Independent with assistive device(s)        Comments: Using RW        OT  Problem List: Decreased strength;Decreased activity tolerance;Pain;Decreased safety awareness      OT Treatment/Interventions: Therapeutic activities;Self-care/ADL training;Therapeutic exercise;Patient/family education    OT Goals(Current goals can be found in the care plan section) Acute Rehab OT Goals Patient Stated Goal: to feel better OT Goal Formulation: With patient Time For Goal Achievement: 07/30/17 Potential to Achieve Goals: Good ADL Goals Pt Will Perform Grooming: with supervision;standing Pt Will Perform Upper Body Dressing: with min guard assist;sitting Pt Will Perform Lower Body Dressing: with min guard assist;sit to/from stand Pt Will Transfer to Toilet: with supervision;ambulating;bedside commode (BSC over toilet) Pt Will Perform Toileting - Clothing Manipulation and hygiene: with supervision;sit to/from stand Additional ADL Goal #1: Pt will independently verbalize post-pacemaker placement progression precautions.  OT Frequency: Min 2X/week   Barriers to D/C:            Co-evaluation              AM-PAC PT "6 Clicks" Daily Activity     Outcome Measure Help from another person eating meals?: None Help from another person taking care of personal grooming?: A Little Help from another person toileting, which includes using toliet, bedpan, or urinal?: A Little Help from another person bathing (including washing, rinsing, drying)?: A Lot Help from another person to put on and taking off regular upper body clothing?: A Little Help from another person to put on and taking off regular lower body clothing?: A Lot 6 Click Score: 17   End of Session Nurse Communication: Mobility status  Activity Tolerance: Treatment limited secondary to medical complications (Comment) (nausea and chest pain; lightheadedness) Patient left: in bed;with call bell/phone within reach;with family/visitor present  OT Visit Diagnosis: Muscle weakness (generalized) (M62.81);Pain Pain -  Right/Left:  (chest) Pain - part of body:  (chest)                Time: 1245-8099 OT Time Calculation (min): 12 min Charges:  OT General Charges $OT Visit: 1 Visit OT Evaluation $OT Eval Moderate Complexity: 1 Mod G-Codes:     Norman Herrlich, MS OTR/L  Pager: [phone] A [name] 07/16/2017, 1:34 PM

## 2017-07-16 NOTE — Progress Notes (Signed)
Pt is back in bed with daughter at beside feels weak and slight light-headed b/p stable OT evaluation complete waiting on PT.

## 2017-07-16 NOTE — Care Management Note (Signed)
Case Management Note  Patient Details  Name: Holly [name] MRN: [id number] Date of Birth: [birth year]  Subjective/Objective:  3 degree AV block                 Action/Plan: Patient discharging home today; Holly [name] choice offered, patient chose Holly [name]; Holly [name] with [facility] called for arrangements; 3:1 ordered and to be delivered to the room today prior to dc home.  Expected Discharge Date:  07/16/17               Expected Discharge Plan:  Holly [name]     Discharge planning Services  CM Consult  Choice offered to:  Patient  DME Arranged:  3-N-1 DME Agency:  [place]:  RN, PT [facility] Agency:  [place]  Status of Service:  In process, will continue to follow  Holly [name] [phone] 07/16/2017, 1:35 PM

## 2017-07-16 NOTE — Discharge Summary (Addendum)
Physician Discharge Summary  Patient ID: Holly [name] MRN: [id number] DOB/AGE: 77-30-1942 77 y.o.  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 She underwent successful dual-chamber pacemaker placement.  She is doing well postop.  Steri-Strips are in place.  No hematoma or swelling noted.  Pain is controlled. Monitor shows  Sinus rhythm with ventricle paced rhythm.  Incidentally, she is found to have hyponatremia with sodium of 129.  She had been on diuretics recently and I suspect this is depression of hyponatremia in the setting of the wall make exam.  I have stop her diuretics as well as irbesartan. I will check BMP before her clinic follow up visit.  I started her on Toprol-XL 25 mg for blood pressure control.  Of note, she has mild intracavitary gradient on echocardiogram.  I believe she will benefit from beta blocker.  I'll see her for follow-up in the clinic on 07/24/2017.  She also has follow-up with pacemaker wound check on 07/28/2017.   The right cephalic vein was therefore directly visualized and cannulated.  Through the right cephalic vein, a Medtronic model 5076 (serial number [id number]) right atrial lead and a Medtronic model 5076 (serial number [id number]) right ventricular lead were advanced   Consults: Electrophysilogy  Significant Diagnostic Studies: Echocardiogram Study Conclusions  - Left ventricle: The cavity size was normal. Systolic function was   vigorous. The estimated ejection fraction was in the range of 65%   to 70%. Wall motion was normal; there were no  regional wall   motion abnormalities. The study is not technically sufficient to   allow evaluation of LV diastolic function. - Left atrium: The atrium was mildly dilated. - Atrial septum: No defect or patent foramen ovale was identified.  Impressions:  - Normal pulmonary artery pressure.  Treatments:  Pacemaker placement  Discharge Exam: Blood pressure 117/61, pulse 68, temperature 98.3 F (36.8 C), temperature source Oral, resp. rate 18, height 4\' 10"  (1.473 m), weight 80.7 kg (178 lb), SpO2 96 %.  Physical Exam  Nursing note and vitals reviewed. Constitutional: She is oriented to person, place, and time. She appears well-developed and well-nourished.  HENT:  Head: Normocephalic and atraumatic.  Neck: No JVD present.  Prominent right carotid pulsations Cardiovascular: Regular rhythm.   Murmur (II/VI late systolic murmur aortic area) heard. Steristrips in place. No hematoma, erythema  Respiratory: She has no wheezes. She has no rales.  GI: Soft. Bowel sounds are normal. She exhibits distension.  Musculoskeletal: She exhibits no edema.  Neurological: She is alert and oriented to person  Disposition: 01-Home or Self Care  Discharge Instructions    Call MD for:  redness, tenderness, or signs of infection (pain, swelling, redness, odor or green/yellow discharge around incision site)    Complete by:  As directed    Call MD for:  severe uncontrolled pain    Complete by:  As directed    Diet - low sodium heart healthy    Complete by:  As directed    Increase activity slowly    Complete by:  As directed      Allergies as of 07/16/2017      Reactions  Lisinopril Cough   Venlafaxine Rash      Medication List    STOP taking these medications   amLODipine 5 MG tablet Commonly known as:  NORVASC   irbesartan 300 MG tablet Commonly known as:  AVAPRO   triamterene-hydrochlorothiazide 37.5-25 MG tablet Commonly known as:  MAXZIDE-25     TAKE these medications    acetaminophen-codeine 300-30 MG tablet Commonly known as:  TYLENOL #3 Take 1 tablet by mouth 2 (two) times daily as needed for pain.   aspirin 81 MG chewable tablet Chew 81 mg by mouth daily.   CALCIUM 600 + D 600-200 MG-UNIT Tabs Generic drug:  Calcium Carb-Cholecalciferol Take 1 tablet by mouth daily.   clotrimazole-betamethasone cream Commonly known as:  LOTRISONE Apply 1 application topically 2 (two) times daily.   diclofenac sodium 1 % Gel Commonly known as:  VOLTAREN Apply 4 g topically 4 (four) times daily. To shoulder What changed:  when to take this  reasons to take this  additional instructions   ibuprofen 600 MG tablet Commonly known as:  ADVIL,MOTRIN Take 600 mg by mouth every 8 (eight) hours as needed for mild pain.   metoprolol succinate 25 MG 24 hr tablet Commonly known as:  TOPROL-XL Take 1 tablet (25 mg total) by mouth daily.   PATADAY 0.2 % Soln Generic drug:  Olopatadine HCl Apply 1 drop to eye daily.   PROBIOTIC PO Take 1 capsule by mouth daily as needed (supplement).   RABEprazole 20 MG tablet Commonly known as:  ACIPHEX Take 20 mg by mouth daily as needed (acid reflux).   rosuvastatin 10 MG tablet Commonly known as:  CRESTOR Take 10 mg by mouth daily.   traMADol 50 MG tablet Commonly known as:  ULTRAM Take 1 tablet (50 mg total) by mouth every 8 (eight) hours as needed.   vitamin B-12 100 MCG tablet Commonly known as:  CYANOCOBALAMIN Take 100 mcg by mouth daily.            Discharge Care Instructions        Start     Ordered   07/16/17 0000  metoprolol succinate (TOPROL-XL) 25 MG 24 hr tablet  Daily     07/16/17 0813   07/16/17 0000  traMADol (ULTRAM) 50 MG tablet  Every 8 hours PRN     07/16/17 0821   07/16/17 0000  Increase activity slowly     07/16/17 0823   07/16/17 0000  Diet - low sodium heart healthy     07/16/17 0823   07/16/17 0000  Call MD for:  severe uncontrolled pain     07/16/17 0823   07/16/17 0000   Call MD for:  redness, tenderness, or signs of infection (pain, swelling, redness, odor or green/yellow discharge around incision site)     07/16/17 0823     Follow-up Information    [place] [facility] Follow up on 07/28/2017.   Specialty:  Cardiology Why:  4:00PM, wound check visit Contact information: [address], [address] [place]       Constance Haw, MD Follow up on 10/14/2017.   Specialty:  Cardiology Why:  10:45AM Contact information: [place] [address] [phone]        Nigel Mormon, MD Follow up on 07/24/2017.   Specialty:  Cardiology Why:  8:30 AM Contact information: [address] [place] [address] [phone]  SignedNigel Mormon 07/16/2017, 9:03 AM

## 2017-07-16 NOTE — Evaluation (Signed)
Physical Therapy Evaluation Patient Details Name: Holly [name] MRN: [id number] DOB: 04-Apr-1941 Today's Date: 07/16/2017   History of Present Illness  Pt is a 76 y.o. female admitted for intermittent third-degree AV block. Now s/p pacemaker placement 07/15/17. PMH significant for essential hypertension, and GERD.  Clinical Impression  Pt presenting with problem above and deficits below. PTA, pt was using RW for mobility. Upon eval, pt limited by decreased strength, balance, and lightheadedness. Mobility slightly limited secondary to lightheadedness. Pt required min guard for safety throughout. Reports she wants to go home today and is feeling better. Educated about need for HHPT and DME below to increase safety with mobility. Will continue to follow acutely to maximize functional mobility independence and safety.     Follow Up Recommendations Home health PT;Supervision for mobility/OOB    Equipment Recommendations  3in1 (PT)    Recommendations for Other Services       Precautions / Restrictions Precautions Precautions: None Restrictions Weight Bearing Restrictions: Yes LUE Weight Bearing: Non weight bearing      Mobility  Bed Mobility Overal bed mobility: Needs Assistance Bed Mobility: Supine to Sit;Sit to Supine     Supine to sit: Min assist Sit to supine: Min assist   General bed mobility comments: Min A for trunk elevation and min A for LE lift back to supine. Pt reports she requires assist at home for bed mobility sometimes.   Transfers Overall transfer level: Needs assistance Equipment used: Rolling walker (2 wheeled) Transfers: Sit to/from Stand Sit to Stand: Min guard         General transfer comment: Min guard for safety. Verbal cues for safe hand placement.   Ambulation/Gait Ambulation/Gait assistance: Min guard Ambulation Distance (Feet): 50 Feet Assistive device: Rolling walker (2 wheeled) Gait Pattern/deviations: Step-through pattern;Decreased stride  length;Antalgic Gait velocity: Decreased Gait velocity interpretation: Below normal speed for age/gender General Gait Details: Slow, slightly antalgic gait secondary to R hip pain at baseline. Required cues for upright posture. Distance limited by lightheadedness.   Stairs            Wheelchair Mobility    Modified Rankin (Stroke Patients Only)       Balance Overall balance assessment: Needs assistance Sitting-balance support: No upper extremity supported;Feet supported Sitting balance-Leahy Scale: Good     Standing balance support: Bilateral upper extremity supported;During functional activity Standing balance-Leahy Scale: Poor Standing balance comment: Reliant on RW for stability                              Pertinent Vitals/Pain Pain Assessment: No/denies pain    Home Living Family/patient expects to be discharged to:: Private residence Living Arrangements: Children;Other relatives Available Help at Discharge: Family;Available PRN/intermittently Type of Home: House Home Access: Stairs to enter Entrance Stairs-Rails: Right;Left;Can reach both Entrance Stairs-Number of Steps: 2 on back; 4-5 in front Home Layout: One level Home Equipment: Walker - 2 wheels;Cane - single point      Prior Function Level of Independence: Independent with assistive device(s)         Comments: Using RW     Hand Dominance   Dominant Hand: Right    Extremity/Trunk Assessment   Upper Extremity Assessment Upper Extremity Assessment: Defer to OT evaluation    Lower Extremity Assessment Lower Extremity Assessment: Generalized weakness;RLE deficits/detail RLE Deficits / Details: R hip pain at baseline and reports popping in hip.        Communication  Communication: No difficulties  Cognition Arousal/Alertness: Awake/alert Behavior During Therapy: WFL for tasks assessed/performed Overall Cognitive Status: Within Functional Limits for tasks assessed                                         General Comments General comments (skin integrity, edema, etc.): Pt reporting she is feeling better and is wanting to go home today. Discussed need for HHPT and 3 in 1 to increase safety with mobility and pt agreeable.     Exercises     Assessment/Plan    PT Assessment Patient needs continued PT services  PT Problem List Decreased strength;Decreased balance;Decreased mobility;Decreased knowledge of use of DME;Decreased knowledge of precautions;Pain       PT Treatment Interventions DME instruction;Gait training;Stair training;Functional mobility training;Therapeutic activities;Therapeutic exercise;Balance training;Neuromuscular re-education;Patient/family education    PT Goals (Current goals can be found in the Care Plan section)  Acute Rehab PT Goals Patient Stated Goal: to feel better PT Goal Formulation: With patient Time For Goal Achievement: 07/23/17 Potential to Achieve Goals: Good    Frequency Min 3X/week   Barriers to discharge        Co-evaluation               AM-PAC PT "6 Clicks" Daily Activity  Outcome Measure Difficulty turning over in bed (including adjusting bedclothes, sheets and blankets)?: A Little Difficulty moving from lying on back to sitting on the side of the bed? : Unable Difficulty sitting down on and standing up from a chair with arms (e.g., wheelchair, bedside commode, etc,.)?: Unable Help needed moving to and from a bed to chair (including a wheelchair)?: A Little Help needed walking in hospital room?: A Little Help needed climbing 3-5 steps with a railing? : A Lot 6 Click Score: 13    End of Session Equipment Utilized During Treatment: Gait belt Activity Tolerance: Treatment limited secondary to medical complications (Comment) (lightheadedness. ) Patient left: in bed;with call bell/phone within reach Nurse Communication: Mobility status;Other (comment) (light headedness ) PT Visit Diagnosis:  Other abnormalities of gait and mobility (R26.89);Muscle weakness (generalized) (M62.81)    Time: 8676-7209 PT Time Calculation (min) (ACUTE ONLY): 18 min   Charges:   PT Evaluation $PT Eval Moderate Complexity: 1 Mod     PT G Codes:        Leighton Ruff, PT, DPT  Acute Rehabilitation Services  Pager: [phone]   Rudean Hitt 07/16/2017, 1:58 PM

## 2017-07-17 DIAGNOSIS — R269 Unspecified abnormalities of gait and mobility: Secondary | ICD-10-CM | POA: Diagnosis not present

## 2017-07-17 DIAGNOSIS — M150 Primary generalized (osteo)arthritis: Secondary | ICD-10-CM | POA: Diagnosis not present

## 2017-07-17 DIAGNOSIS — G609 Hereditary and idiopathic neuropathy, unspecified: Secondary | ICD-10-CM | POA: Diagnosis not present

## 2017-07-18 DIAGNOSIS — I4510 Unspecified right bundle-branch block: Secondary | ICD-10-CM | POA: Diagnosis not present

## 2017-07-18 DIAGNOSIS — K219 Gastro-esophageal reflux disease without esophagitis: Secondary | ICD-10-CM | POA: Diagnosis not present

## 2017-07-18 DIAGNOSIS — I4469 Other fascicular block: Secondary | ICD-10-CM | POA: Diagnosis not present

## 2017-07-18 DIAGNOSIS — I442 Atrioventricular block, complete: Secondary | ICD-10-CM | POA: Diagnosis not present

## 2017-07-18 DIAGNOSIS — E871 Hypo-osmolality and hyponatremia: Secondary | ICD-10-CM | POA: Diagnosis not present

## 2017-07-18 DIAGNOSIS — I10 Essential (primary) hypertension: Secondary | ICD-10-CM | POA: Diagnosis not present

## 2017-07-18 DIAGNOSIS — Z950 Presence of cardiac pacemaker: Secondary | ICD-10-CM | POA: Diagnosis not present

## 2017-07-19 ENCOUNTER — Emergency Department (HOSPITAL_COMMUNITY)
Admission: EM | Admit: 2017-07-19 | Discharge: 2017-07-20 | Disposition: A | Discharge status: Home / Self Care | Payer: PPO | Attending: Emergency Medicine | Admitting: Emergency Medicine

## 2017-07-19 ENCOUNTER — Encounter (HOSPITAL_COMMUNITY): Payer: Self-pay | Admitting: Emergency Medicine

## 2017-07-19 ENCOUNTER — Emergency Department (HOSPITAL_COMMUNITY): Payer: PPO

## 2017-07-19 DIAGNOSIS — Z79899 Other long term (current) drug therapy: Secondary | ICD-10-CM | POA: Insufficient documentation

## 2017-07-19 DIAGNOSIS — R030 Elevated blood-pressure reading, without diagnosis of hypertension: Secondary | ICD-10-CM | POA: Diagnosis not present

## 2017-07-19 DIAGNOSIS — I10 Essential (primary) hypertension: Secondary | ICD-10-CM | POA: Diagnosis not present

## 2017-07-19 DIAGNOSIS — M79602 Pain in left arm: Secondary | ICD-10-CM | POA: Insufficient documentation

## 2017-07-19 DIAGNOSIS — Z950 Presence of cardiac pacemaker: Secondary | ICD-10-CM | POA: Diagnosis not present

## 2017-07-19 DIAGNOSIS — R51 Headache: Secondary | ICD-10-CM | POA: Diagnosis not present

## 2017-07-19 DIAGNOSIS — Z7982 Long term (current) use of aspirin: Secondary | ICD-10-CM | POA: Insufficient documentation

## 2017-07-19 LAB — CBC WITH DIFFERENTIAL/PLATELET
Basophils Absolute: 0.1 10*3/uL (ref 0.0–0.1)
Basophils Relative: 1 %
Eosinophils Absolute: 0.4 10*3/uL (ref 0.0–0.7)
Eosinophils Relative: 8 %
HEMATOCRIT: 30.6 % — AB (ref 36.0–46.0)
Hemoglobin: 10.3 g/dL — ABNORMAL LOW (ref 12.0–15.0)
LYMPHS PCT: 19 %
Lymphs Abs: 1 10*3/uL (ref 0.7–4.0)
MCH: 28.5 pg (ref 26.0–34.0)
MCHC: 33.7 g/dL (ref 30.0–36.0)
MCV: 84.8 fL (ref 78.0–100.0)
MONOS PCT: 19 %
Monocytes Absolute: 0.9 10*3/uL (ref 0.1–1.0)
NEUTROS PCT: 53 %
Neutro Abs: 2.6 10*3/uL (ref 1.7–7.7)
Platelets: 215 10*3/uL (ref 150–400)
RBC: 3.61 MIL/uL — ABNORMAL LOW (ref 3.87–5.11)
RDW: 13.8 % (ref 11.5–15.5)
WBC: 5 10*3/uL (ref 4.0–10.5)

## 2017-07-19 LAB — BASIC METABOLIC PANEL
ANION GAP: 10 (ref 5–15)
BUN: 22 mg/dL — ABNORMAL HIGH (ref 6–20)
CALCIUM: 9.4 mg/dL (ref 8.9–10.3)
CHLORIDE: 99 mmol/L — AB (ref 101–111)
CO2: 21 mmol/L — AB (ref 22–32)
Creatinine, Ser: 1.29 mg/dL — ABNORMAL HIGH (ref 0.44–1.00)
GFR calc Af Amer: 45 mL/min — ABNORMAL LOW (ref >60)
GFR calc non Af Amer: 39 mL/min — ABNORMAL LOW (ref >60)
GLUCOSE: 98 mg/dL (ref 65–99)
Potassium: 4.5 mmol/L (ref 3.5–5.1)
Sodium: 130 mmol/L — ABNORMAL LOW (ref 135–145)

## 2017-07-19 LAB — I-STAT TROPONIN, ED: Troponin i, poc: 0.04 ng/mL (ref 0.00–0.08)

## 2017-07-19 MED ORDER — AMLODIPINE BESYLATE 5 MG PO TABS
2.5000 mg | ORAL_TABLET | Freq: Once | ORAL | Status: AC
  Administered 2017-07-20: 2.5 mg via ORAL
  Filled 2017-07-19: qty 1

## 2017-07-19 MED ORDER — SODIUM CHLORIDE 0.9 % IV BOLUS (SEPSIS)
500.0000 mL | Freq: Once | INTRAVENOUS | Status: AC
  Administered 2017-07-19: 500 mL via INTRAVENOUS

## 2017-07-19 NOTE — ED Provider Notes (Signed)
[facility] DEPT Provider Note   CSN: [id number] Arrival date & time: 07/19/17  2042     History   Chief Complaint Chief Complaint  Patient presents with  . Hypertension    HPI Holly [name] is a 76 y.o. female.  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 Patient states that approximately 4:30 this afternoon she felt a wave of pain over her head that started from the posterior occiput and spread anteriorly. The patient states this happens when her blood pressure elevates and she figured that her blood pressure was high. Measured her blood pressure was systolic in the 696E. Patient also states she had some left arm pain that was enough to know that something was wrong. States that she took a tramadol that she was prescribed for her recent pacemaker placement on 9/11. States this resolved her symptoms. EMS was called and patient transported to the ED. On arrival to the ED patient's blood pressure 160/72. The patient denies any further complaints at this time. Patient denies any associated dizziness, lightheadedness, vision changes, shortness of breath, chest pain. Patient states that she does have high blood pressure and takes her medications regularly. There having a difficult time controlling her blood pressure and she is also low blood pressure medications. Patient recently had a pacemaker placed on 9/11 for third-degree heart block. Of note patient also has a pulsatile mass to her right neck that has been present the past several weeks. Patient has been seen in the ED, by PCP, by ENT for same. Patient had CT scan on 9/5 of her neck and chest that showed no vascular involvement. Patient states that her blood pressures elevated the mass "beats faster and harder" and she gets  very anxious.  Patient denies any associated diaphoresis, nausea, emesis.  Of note patient states that she was taken off of her HCTZ, avapro, and norvasc after pacemaker placement due to low sodium. She was kept on her toprol.   Pt denies any fever, chill, vision changes, lightheadedness, dizziness, congestion, neck pain, cp, sob, cough, abd pain, n/v/d, urinary symptoms, change in bowel habits, melena, hematochezia, lower extremity paresthesias.  Past Medical History:  Diagnosis Date  . Bronchitis   . Hypertension     Patient Active Problem List   Diagnosis Date Noted  . 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 Carpal tunnel syndrome 08/09/2013    Past Surgical History:  Procedure Laterality Date  . left leg surgery    . PACEMAKER IMPLANT N/A 07/15/2017   Procedure: Pacemaker Implant;  Surgeon: Constance Haw, MD;  Location: [facility];  Service: Cardiovascular;  Laterality: N/A;    OB History    No data available       Home Medications    Prior to Admission medications   Medication Sig Start Date End Date Taking? Authorizing Provider  acetaminophen-codeine (TYLENOL #3) 300-30 MG tablet Take 1 tablet by mouth 2 (two) times daily as needed for pain. 05/14/17   [provider]  aspirin 81 MG chewable tablet Chew 81 mg by mouth daily.    [provider]  Calcium Carb-Cholecalciferol (CALCIUM 600 + D) 600-200 MG-UNIT TABS Take 1 tablet by mouth daily.    [provider]  clotrimazole-betamethasone (LOTRISONE) cream  Apply 1 application topically 2 (two) times daily.  01/27/17   [provider]  diclofenac sodium (VOLTAREN) 1 % GEL Apply 4 g topically 4 (four) times daily. To shoulder Patient taking differently: Apply 4 g topically daily as needed (pain). To shoulder 08/07/16   Billy Fischer, MD  ibuprofen (ADVIL,MOTRIN) 600 MG  tablet Take 600 mg by mouth every 8 (eight) hours as needed for mild pain.  02/03/14   [provider]  metoprolol succinate (TOPROL-XL) 25 MG 24 hr tablet Take 1 tablet (25 mg total) by mouth daily. 07/16/17   Patwardhan, Manish J, MD  PATADAY 0.2 % SOLN Apply 1 drop to eye daily.  02/24/17   [provider]  Probiotic Product (PROBIOTIC PO) Take 1 capsule by mouth daily as needed (supplement).     [provider]  RABEprazole (ACIPHEX) 20 MG tablet Take 20 mg by mouth daily as needed (acid reflux).  04/04/14   [provider]  rosuvastatin (CRESTOR) 10 MG tablet Take 10 mg by mouth daily. 07/10/17   [provider]  traMADol (ULTRAM) 50 MG tablet Take 1 tablet (50 mg total) by mouth every 8 (eight) hours as needed. 07/16/17 07/26/17  Patwardhan, Reynold Bowen, MD  vitamin B-12 (CYANOCOBALAMIN) 100 MCG tablet Take 100 mcg by mouth daily.    [provider]    Family History Family History  Problem Relation Age of Onset  . Hypertension Mother   . ALS Sister   . Heart attack Brother   . Diabetes Daughter     Social History Social History  Substance Use Topics  . Smoking status: Never Smoker  . Smokeless tobacco: Never Used  . Alcohol use No     Allergies   Lisinopril and Venlafaxine   Review of Systems Review of Systems  Constitutional: Negative for chills and fever.  HENT: Negative for congestion.   Eyes: Negative for visual disturbance.  Respiratory: Negative for cough and shortness of breath.   Cardiovascular: Negative for chest pain, palpitations and leg swelling.  Gastrointestinal: Negative for abdominal pain, diarrhea, nausea and vomiting.  Genitourinary: Negative for dysuria, flank pain, frequency, hematuria, urgency, vaginal bleeding and vaginal discharge.  Musculoskeletal: Positive for myalgias. Negative for arthralgias.  Skin: Negative for rash.  Neurological: Positive for headaches. Negative for dizziness, syncope, weakness,  light-headedness and numbness.  Psychiatric/Behavioral: Negative for sleep disturbance. The patient is not nervous/anxious.      Physical Exam Updated Vital Signs BP (!) 160/72   Pulse 76   Temp 98.6 F (37 C) (Oral)   Resp 10   SpO2 100%   Physical Exam  Constitutional: She is oriented to person, place, and time. She appears well-developed and well-nourished.  Non-toxic appearance. No distress.  HENT:  Head: Normocephalic and atraumatic.  Nose: Nose normal.  Mouth/Throat: Oropharynx is clear and moist.  Eyes: Pupils are equal, round, and reactive to light. Conjunctivae and EOM are normal. Right eye exhibits no discharge. Left eye exhibits no discharge.  Neck: Normal range of motion. Neck supple. No JVD present. No tracheal deviation present.  Pulsatile mass to the right base of the neck. Non tender. No jvd appreciated.  Cardiovascular: Normal rate, regular rhythm, normal heart sounds and intact distal pulses.  Exam reveals no gallop and no friction rub.   No murmur heard. Pulmonary/Chest: Effort normal and breath sounds normal. No respiratory distress. She has no wheezes. She has no rales. She exhibits no tenderness.  No hypoxia or tachypnea.  Patient does  have pacemaker placement to left anterior chest with 2 healing surgical scars noted. No signs of overlying cellulitis. Nontender to palpation.  Abdominal: Soft. Bowel sounds are normal. She exhibits no distension. There is no tenderness. There is no rebound and no guarding.  Musculoskeletal: Normal range of motion.  No lower extremity edema or calf tenderness.  Lymphadenopathy:    She has no cervical adenopathy.  Neurological: She is alert and oriented to person, place, and time.  The patient is alert, attentive, and oriented x 3. Speech is clear. Cranial nerve II-VII grossly intact. Negative pronator drift. Sensation intact. Strength 5/5 in all extremities. Reflexes 2+ and symmetric at biceps, triceps, knees, and ankles. Rapid  alternating movement and fine finger movements intact.    Skin: Skin is warm and dry. Capillary refill takes less than 2 seconds. She is not diaphoretic.  Psychiatric: Her behavior is normal. Judgment and thought content normal.  Nursing note and vitals reviewed.    ED Treatments / Results  Labs (all labs ordered are listed, but only abnormal results are displayed) Labs Reviewed  CBC WITH DIFFERENTIAL/PLATELET - Abnormal; Notable for the following:       Result Value   RBC 3.61 (*)    Hemoglobin 10.3 (*)    HCT 30.6 (*)    All other components within normal limits  BASIC METABOLIC PANEL - Abnormal; Notable for the following:    Sodium 130 (*)    Chloride 99 (*)    CO2 21 (*)    BUN 22 (*)    Creatinine, Ser 1.29 (*)    GFR calc non Af Amer 39 (*)    GFR calc Af Amer 45 (*)    All other components within normal limits  I-STAT TROPONIN, ED    EKG  EKG Interpretation None       Radiology Dg Chest 2 View  Result Date: 07/19/2017 CLINICAL DATA:  Hypertension EXAM: CHEST  2 VIEW COMPARISON:  [id number] FINDINGS: Borderline cardiomegaly with tortuous ectatic thoracic aorta. Minimal aortic atherosclerosis. The lungs are clear without overt pulmonary edema, effusion or pneumothorax. No pneumonic consolidation. Osteoarthritis of the glenohumeral joints bilaterally. Left-sided pacemaker apparatus with right atrial and right ventricular leads are again seen. There is levoconvex scoliosis of the lower thoracic spine and convex to the right involving the included lumbar spine, stable in appearance. IMPRESSION: 1. Stable borderline cardiomegaly with an ectatic thoracic aorta. Minimal aortic atherosclerosis. No acute pulmonary disease. 2. Scoliotic curvature of the thoracic and lumbar spine as above. Electronically Signed   By: Ashley Royalty M.D.   On: 07/19/2017 22:04   Ct Head Wo Contrast  Result Date: 07/19/2017 CLINICAL DATA:  Headache since this morning. EXAM: CT HEAD WITHOUT CONTRAST  TECHNIQUE: Contiguous axial images were obtained from the base of the skull through the vertex without intravenous contrast. COMPARISON:  Head CT 07/09/2017 FINDINGS: Brain: Stable atrophy and chronic small vessel ischemia. No intracranial hemorrhage, mass effect, or midline shift. No hydrocephalus. The basilar cisterns are patent. No evidence of territorial infarct or acute ischemia. No extra-axial or intracranial fluid collection. Vascular: Atherosclerosis of skullbase vasculature without hyperdense vessel or abnormal calcification. Skull: No skull fracture or focal lesion. Sinuses/Orbits: Paranasal sinuses and mastoid air cells are clear. The visualized orbits are unremarkable. Other: None. IMPRESSION: No acute intracranial abnormality. Electronically Signed   By: Jeb Levering M.D.   On: 07/19/2017 23:45    Procedures Procedures (including critical care time)  Medications Ordered in ED Medications  sodium chloride  0.9 % bolus 500 mL (0 mLs Intravenous Stopped 07/19/17 2340)  amLODipine (NORVASC) tablet 2.5 mg (2.5 mg Oral Given 07/20/17 0018)     Initial Impression / Assessment and Plan / ED Course  I have reviewed the triage vital signs and the nursing notes.  Pertinent labs & imaging results that were available during my care of the patient were reviewed by me and considered in my medical decision making (see chart for details).     Patient presents to the ED today with complaints of elevated blood pressure, headache, left arm pain. Patient recently had a pacemaker inserted on 9/11 for complete heart block. At that time patient was taken off of 3 of her blood pressure medicine a couple metoprolol. This was due to patient's hyponatremia. This evening patient developed a progressive headache which she states happens when her blood pressure gets high. The patient transported to the ED for evaluation. Initial blood pressure with EMS was 017 systolic. Here in the ED patient's systolic has been  in the 160s.  Patient states her symptoms are completely resolved. She does report some left arm pain that she took tramadol 4. Pain is worse with movement. This seems related to her pacemaker placement.  Vital signs are reassuring. Blood pressure has improved in the ED. She is afebrile, no tachycardia, no tachypnea. Overall patient is well-appearing. No focal neuro deficits on exam. Pacemaker in place that does not appear to be infected. Patient does have a pulsatile mass to her right neck that she can seen several times in the past by ENT or primary care, ER. Recent CT scan of neck showed no vascular compromise. This will need to be follow up with her primary care doctor.  Patient's lab work is reassuring. Mild hyponatremia which is the patient's baseline. Mild elevated creatinine likely due to dehydration. We'll give 500 mL fluid bolus. Hemoglobin at baseline. Negative troponin. EKG shows no ischemic changes. Chest x-ray is unchanged from baseline. Her pacemaker was interrogated that showed no abnormalities. She is 100% ventricular paced.  Patient presentation does not seem concerning for ACS, PE, dissection. Symptoms have resolved in the ED. Patient has no focal neuro deficits. Head CT was obtained that was unremarkable. Low suspicion for ruptured aneurysm or subarachnoid hemorrhage. Patient denies any pain on discharge. Patient will be started back on her Norvasc 2.5 mg. Patient being discontinued with her. Her blood pressure medicines may be contributing to her elevated blood pressure.  Pt is hemodynamically stable, in NAD, & able to ambulate in the ED. Evaluation does not show pathology that would require ongoing emergent intervention or inpatient treatment. I explained the diagnosis to the patient. Pain has been managed & has no complaints prior to dc. Pt is comfortable with above plan and is stable for discharge at this time. All questions were answered prior to disposition. Strict return  precautions for f/u to the ED were discussed. Encouraged follow up with PCP and cardiology.  Pt seen and eval by Dr. Ellender Hose who is agreeable to the above plan.    Final Clinical Impressions(s) / ED Diagnoses   Final diagnoses:  Hypertension, unspecified type  Left arm pain    New Prescriptions Discharge Medication List as of 07/19/2017 11:59 PM       Doristine Devoid, PA-C 07/20/17 0051    Duffy Bruce, MD 07/20/17 [phone]

## 2017-07-19 NOTE — ED Triage Notes (Signed)
Per EMS pt was at home tonight resting and began to feel "hot".  Pt has a history of pacemaker placement on Tuesday. Discharged on the 12th.  BP 220/122.  Pt has obvious JVD. Insertion site appears well. No acute distress  A & O x 4.  No SHOB, nausea, CP, or stroke symptoms.  Takes medications as prescribed.  Tonight took Tramadol for discomfort for the first time.

## 2017-07-19 NOTE — Discharge Instructions (Signed)
Please start back taking your Norvasc 2.5 mg once a day. All of your imaging and blood work has been reassuring. May take your pain medicine as needed for your pain related to the pacemaker. Please make sure to follow-up with her cardiologist and primary care doctor next week. Return to the ED if he develops any worsening symptoms.

## 2017-07-20 MED ORDER — AMLODIPINE BESYLATE 2.5 MG PO TABS: 2.5000 mg | ORAL_TABLET | Freq: Every day | ORAL | 0 refills | Status: DC

## 2017-07-22 DIAGNOSIS — E782 Mixed hyperlipidemia: Secondary | ICD-10-CM | POA: Diagnosis not present

## 2017-07-22 DIAGNOSIS — G609 Hereditary and idiopathic neuropathy, unspecified: Secondary | ICD-10-CM | POA: Diagnosis not present

## 2017-07-22 DIAGNOSIS — Z23 Encounter for immunization: Secondary | ICD-10-CM | POA: Diagnosis not present

## 2017-07-22 DIAGNOSIS — I129 Hypertensive chronic kidney disease with stage 1 through stage 4 chronic kidney disease, or unspecified chronic kidney disease: Secondary | ICD-10-CM | POA: Diagnosis not present

## 2017-07-24 DIAGNOSIS — I10 Essential (primary) hypertension: Secondary | ICD-10-CM | POA: Diagnosis not present

## 2017-07-24 DIAGNOSIS — E782 Mixed hyperlipidemia: Secondary | ICD-10-CM | POA: Diagnosis not present

## 2017-07-24 DIAGNOSIS — Z950 Presence of cardiac pacemaker: Secondary | ICD-10-CM | POA: Diagnosis not present

## 2017-07-24 DIAGNOSIS — R0789 Other chest pain: Secondary | ICD-10-CM | POA: Diagnosis not present

## 2017-07-28 ENCOUNTER — Ambulatory Visit: Payer: PPO

## 2017-07-28 DIAGNOSIS — I10 Essential (primary) hypertension: Secondary | ICD-10-CM | POA: Diagnosis not present

## 2017-07-28 DIAGNOSIS — E871 Hypo-osmolality and hyponatremia: Secondary | ICD-10-CM | POA: Diagnosis not present

## 2017-07-28 DIAGNOSIS — I442 Atrioventricular block, complete: Secondary | ICD-10-CM | POA: Diagnosis not present

## 2017-07-28 DIAGNOSIS — K219 Gastro-esophageal reflux disease without esophagitis: Secondary | ICD-10-CM | POA: Diagnosis not present

## 2017-07-28 DIAGNOSIS — Z950 Presence of cardiac pacemaker: Secondary | ICD-10-CM | POA: Diagnosis not present

## 2017-07-28 DIAGNOSIS — I4510 Unspecified right bundle-branch block: Secondary | ICD-10-CM | POA: Diagnosis not present

## 2017-07-28 DIAGNOSIS — I4469 Other fascicular block: Secondary | ICD-10-CM | POA: Diagnosis not present

## 2017-07-29 ENCOUNTER — Ambulatory Visit (INDEPENDENT_AMBULATORY_CARE_PROVIDER_SITE_OTHER): Payer: PPO | Admitting: *Deleted

## 2017-07-29 DIAGNOSIS — I442 Atrioventricular block, complete: Secondary | ICD-10-CM

## 2017-07-29 LAB — CUP PACEART INCLINIC DEVICE CHECK
Battery Voltage: 3.2 V
Brady Statistic AP VP Percent: 7.44 %
Brady Statistic RA Percent Paced: 7.44 %
Brady Statistic RV Percent Paced: 99.89 %
Implantable Lead Implant Date: 20180911
Implantable Lead Model: 5076
Implantable Lead Model: 5076
Implantable Pulse Generator Implant Date: 20180911
Lead Channel Impedance Value: 342 Ohm
Lead Channel Impedance Value: 399 Ohm
Lead Channel Impedance Value: 494 Ohm
Lead Channel Impedance Value: 494 Ohm
Lead Channel Setting Pacing Amplitude: 3.5 V
Lead Channel Setting Pacing Amplitude: 3.5 V
Lead Channel Setting Pacing Pulse Width: 0.4 ms
MDC IDC LEAD IMPLANT DT: 20180911
MDC IDC LEAD LOCATION: 753859
MDC IDC LEAD LOCATION: 753860
MDC IDC MSMT BATTERY REMAINING LONGEVITY: 138 mo
MDC IDC MSMT LEADCHNL RA PACING THRESHOLD AMPLITUDE: 0.625 V
MDC IDC MSMT LEADCHNL RA PACING THRESHOLD PULSEWIDTH: 0.4 ms
MDC IDC MSMT LEADCHNL RA SENSING INTR AMPL: 3 mV
MDC IDC MSMT LEADCHNL RA SENSING INTR AMPL: 3.5 mV
MDC IDC MSMT LEADCHNL RV PACING THRESHOLD AMPLITUDE: 0.5 V
MDC IDC MSMT LEADCHNL RV PACING THRESHOLD PULSEWIDTH: 0.4 ms
MDC IDC MSMT LEADCHNL RV SENSING INTR AMPL: 9.5 mV
MDC IDC MSMT LEADCHNL RV SENSING INTR AMPL: 9.5 mV
MDC IDC SESS DTM: 20180925105837
MDC IDC SET LEADCHNL RV SENSING SENSITIVITY: 4 mV
MDC IDC STAT BRADY AP VS PERCENT: 0 %
MDC IDC STAT BRADY AS VP PERCENT: 92.45 %
MDC IDC STAT BRADY AS VS PERCENT: 0.1 %

## 2017-07-29 NOTE — Progress Notes (Signed)
Wound check appointment. Steri-strips removed. Wound without redness or edema. Incision edges approximated, wound well healed. Normal device function. Thresholds, sensing, and impedances consistent with implant measurements. Device programmed at 3.5V programmed on for extra safety margin until 3 month visit. Histogram distribution appropriate for patient and level of activity. No mode switches or high ventricular rates noted. Patient educated about wound care, arm mobility, lifting restrictions. ROV with [facility] 12/11.

## 2017-08-01 DIAGNOSIS — I4510 Unspecified right bundle-branch block: Secondary | ICD-10-CM | POA: Diagnosis not present

## 2017-08-01 DIAGNOSIS — K219 Gastro-esophageal reflux disease without esophagitis: Secondary | ICD-10-CM | POA: Diagnosis not present

## 2017-08-01 DIAGNOSIS — I4469 Other fascicular block: Secondary | ICD-10-CM | POA: Diagnosis not present

## 2017-08-01 DIAGNOSIS — I10 Essential (primary) hypertension: Secondary | ICD-10-CM | POA: Diagnosis not present

## 2017-08-01 DIAGNOSIS — E871 Hypo-osmolality and hyponatremia: Secondary | ICD-10-CM | POA: Diagnosis not present

## 2017-08-01 DIAGNOSIS — Z950 Presence of cardiac pacemaker: Secondary | ICD-10-CM | POA: Diagnosis not present

## 2017-08-01 DIAGNOSIS — I442 Atrioventricular block, complete: Secondary | ICD-10-CM | POA: Diagnosis not present

## 2017-08-04 DIAGNOSIS — I10 Essential (primary) hypertension: Secondary | ICD-10-CM | POA: Diagnosis not present

## 2017-08-04 DIAGNOSIS — R0789 Other chest pain: Secondary | ICD-10-CM | POA: Diagnosis not present

## 2017-08-05 DIAGNOSIS — K219 Gastro-esophageal reflux disease without esophagitis: Secondary | ICD-10-CM | POA: Diagnosis not present

## 2017-08-05 DIAGNOSIS — I442 Atrioventricular block, complete: Secondary | ICD-10-CM | POA: Diagnosis not present

## 2017-08-05 DIAGNOSIS — I4469 Other fascicular block: Secondary | ICD-10-CM | POA: Diagnosis not present

## 2017-08-05 DIAGNOSIS — Z950 Presence of cardiac pacemaker: Secondary | ICD-10-CM | POA: Diagnosis not present

## 2017-08-05 DIAGNOSIS — I10 Essential (primary) hypertension: Secondary | ICD-10-CM | POA: Diagnosis not present

## 2017-08-05 DIAGNOSIS — E871 Hypo-osmolality and hyponatremia: Secondary | ICD-10-CM | POA: Diagnosis not present

## 2017-08-05 DIAGNOSIS — I4510 Unspecified right bundle-branch block: Secondary | ICD-10-CM | POA: Diagnosis not present

## 2017-08-08 DIAGNOSIS — E782 Mixed hyperlipidemia: Secondary | ICD-10-CM | POA: Diagnosis not present

## 2017-08-08 DIAGNOSIS — I10 Essential (primary) hypertension: Secondary | ICD-10-CM | POA: Diagnosis not present

## 2017-08-11 DIAGNOSIS — I420 Dilated cardiomyopathy: Secondary | ICD-10-CM | POA: Diagnosis not present

## 2017-08-11 DIAGNOSIS — I10 Essential (primary) hypertension: Secondary | ICD-10-CM | POA: Diagnosis not present

## 2017-08-11 DIAGNOSIS — R9439 Abnormal result of other cardiovascular function study: Secondary | ICD-10-CM | POA: Diagnosis not present

## 2017-08-11 DIAGNOSIS — Z45010 Encounter for checking and testing of cardiac pacemaker pulse generator [battery]: Secondary | ICD-10-CM | POA: Diagnosis not present

## 2017-08-11 DIAGNOSIS — Z950 Presence of cardiac pacemaker: Secondary | ICD-10-CM | POA: Diagnosis not present

## 2017-08-11 DIAGNOSIS — R0789 Other chest pain: Secondary | ICD-10-CM | POA: Diagnosis not present

## 2017-09-04 DIAGNOSIS — E782 Mixed hyperlipidemia: Secondary | ICD-10-CM | POA: Diagnosis not present

## 2017-09-04 DIAGNOSIS — Z0000 Encounter for general adult medical examination without abnormal findings: Secondary | ICD-10-CM | POA: Diagnosis not present

## 2017-09-04 DIAGNOSIS — I10 Essential (primary) hypertension: Secondary | ICD-10-CM | POA: Diagnosis not present

## 2017-09-04 DIAGNOSIS — R5383 Other fatigue: Secondary | ICD-10-CM | POA: Diagnosis not present

## 2017-09-11 DIAGNOSIS — I10 Essential (primary) hypertension: Secondary | ICD-10-CM | POA: Diagnosis not present

## 2017-09-11 DIAGNOSIS — G609 Hereditary and idiopathic neuropathy, unspecified: Secondary | ICD-10-CM | POA: Diagnosis not present

## 2017-09-11 DIAGNOSIS — I129 Hypertensive chronic kidney disease with stage 1 through stage 4 chronic kidney disease, or unspecified chronic kidney disease: Secondary | ICD-10-CM | POA: Diagnosis not present

## 2017-09-11 DIAGNOSIS — J4520 Mild intermittent asthma, uncomplicated: Secondary | ICD-10-CM | POA: Diagnosis not present

## 2017-09-11 DIAGNOSIS — E782 Mixed hyperlipidemia: Secondary | ICD-10-CM | POA: Diagnosis not present

## 2017-09-11 DIAGNOSIS — K219 Gastro-esophageal reflux disease without esophagitis: Secondary | ICD-10-CM | POA: Diagnosis not present

## 2017-09-11 DIAGNOSIS — M150 Primary generalized (osteo)arthritis: Secondary | ICD-10-CM | POA: Diagnosis not present

## 2017-10-14 ENCOUNTER — Encounter: Payer: PPO | Admitting: Cardiology

## 2017-10-15 DIAGNOSIS — R0789 Other chest pain: Secondary | ICD-10-CM | POA: Diagnosis not present

## 2017-10-15 DIAGNOSIS — R9439 Abnormal result of other cardiovascular function study: Secondary | ICD-10-CM | POA: Diagnosis not present

## 2017-10-15 DIAGNOSIS — Z45018 Encounter for adjustment and management of other part of cardiac pacemaker: Secondary | ICD-10-CM | POA: Diagnosis not present

## 2017-10-15 DIAGNOSIS — I10 Essential (primary) hypertension: Secondary | ICD-10-CM | POA: Diagnosis not present

## 2017-10-15 DIAGNOSIS — Z950 Presence of cardiac pacemaker: Secondary | ICD-10-CM | POA: Diagnosis not present

## 2017-11-06 DIAGNOSIS — R0789 Other chest pain: Secondary | ICD-10-CM | POA: Diagnosis not present

## 2017-11-08 DIAGNOSIS — R9439 Abnormal result of other cardiovascular function study: Secondary | ICD-10-CM

## 2017-11-08 NOTE — H&P (Signed)
Holly [name] 11/10/17 3:30 PM Location: [facility] Patient #: [phone] DOB: 03-11-1941 Undefined / Language: Holly [name] / Race: Black or African American Female   History of Present Illness Holly Leep MD; 11-10-2017 9:09 PM) Patient words: Last O/V 08/11/2017; F/U and PACEMAKER Check.  The patient is a 77 year old female presenting for an evaluation for an abnormal stress test. 77 y/o AAF w/hypertension, recent Medtronic dual chamber pacemaker placement 07/2017, exertional chest pain and dyspnea, abnormal stress test for which patient had opted for medical management alone. Today, patient is here for her pacemaker check. She is nearly 100% RV paced. Interrogation shows one episode of 11 beat NSVT on 08/27/2027. Patien does not recollect any episdoes of palpitations. With her limited level of activity, she does not have any significant chest pain, shortness of breath.   Problem List/Past Medical (April Louretta Shorten; 2017/11/10 4:30 PM) Laboratory examination (Z01.89)  Labs 07/08/2017: Total cholesterol 171, HDL 57, LDL 102, triglycerides 61 W previously 5.9, H/H 11.3/35. Platelets 299. MCV normal. TSH 1.7 normal Creatinine 1.06. eGFR 51. Protein 7.4, sodium 142, potassium 4.7. Normal liver enzymes. GERD (gastroesophageal reflux disease) (K21.9)  Benign essential hypertension (I10)  Chest tightness (R07.89)  Now resolved Lexiscan myoview stress test 08/04/2017: 1. Non-diagnostic due to paced rhythm. 2. Study quality: good. The left ventricular chamber dimensions are normal. There is medium scar of the inferolateral segment with moderate to severe residual ischemia. There is small scar of the inferior segment with very mild residual ischemia. 3. Systolic function is mildly reduced. The calculated stress EF is at 44 %. Gated SPECT imaging demonstrates hypokinesis in the inferior, lateral segment(s). 5. Intermediate risk study. Clinical correlation recommended. Shortness of  breath on exertion (R06.02)  Echocardiogram 07/15/2017: - Left ventricle: The cavity size was normal. Systolic function was vigorous. The estimated ejection fraction was in the range of 65% to 70%. Wall motion was normal; there were no regional wall motion abnormalities. The study is not technically sufficient toallow evaluation of LV diastolic function. - Left atrium: The atrium was mildly dilated. - Atrial septum: No defect or patent foramen ovale was identified. 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 Occaisonal PVC, Brief 3-5 beat atrial tachycardia. Abnormal stress test (R94.39)  MRI safe cardiac pacemaker in situ (Z95.0) [07/16/2017]: Unscheduled pacemaker interrogation 08/11/2017: Medtronic dual-chamber pacemaker DDD. lower rate 60 bpm. Nearly 100% ventricular pacing. 8% atrial pacing. No significant events. Underlying rhyhtm 2:1 AV block with long PR interval 270 ms Switched to AAI<=>DDD to reduce unnecessary RV pacing Paced AV 180 msec, sensed AV 150 sec with maximum AV interval limit set at 350 msec EKG 08/11/2017: Sinus rhythm 80 bpm. Normal axis. Left bundle branch block or V-Paced rhythm. Medtronic Azure XT DR MRI Sure Scan (serial number [id number]) pacemaker 07/16/2017 by [facility] for complete heart block.  Allergies (April Garrison; 2017/11/10 4:30 PM) Lisinopril *ANTIHYPERTENSIVES*  Cough. Venlafaxine HCl *ANTIDEPRESSANTS*  Rash.  Family History (April Louretta Shorten; 2017/11/10 4:30 PM) Mother  Deceased. at age 80, HTN, no heart issues Father  Deceased. no known hx Sister 1  Deceased. at age 80, no heart issues Brother 1  Deceased. at age 38 from [place] (April Garrison; 11-10-2017 4:30 PM) Current tobacco use  Never smoker. Non Drinker/No Alcohol  Use  Marital status  Married. Living  Situation  Lives with relatives. Number of Children  7.  Past Surgical History (April Garrison; 10/18/17 4:30 PM) Cardiac Pacemaker Insertion [07/15/2017]: Medtronic Azure XT DR MRI Sure Scan (serial number [id number]) pacemaker 07/16/2017 by [facility] for complete heart block.  Medication History (April Louretta Shorten; 18-Oct-2017 4:30 PM) AmLODIPine Besylate (5MG Tablet, 1 (one) Tablet Oral once daily, Taken starting 08/11/2017) Active. Metoprolol Succinate ER (25MG Tablet ER 24HR, 1 (one) Tablet Tablet Oral daily, Taken starting 08/04/2017) Active. Aspirin 81 (81MG Tablet Chewable, 1 (one) Tablet Tablet Tablet Oral once daily, Taken starting 07/10/2017) Active. Rosuvastatin Calcium (10MG Tablet, 1 (one) Tablet Tablet Tablet Oral once daily, Taken starting 07/10/2017) Active. Calcium + D (600-200 Oral daily) Specific strength unknown - Active. Cetirizine HCl (10MG Tablet, 1 Oral daily) Active. Clotrimazole-Betamethasone (1-0.05% Cream, External two times daily) Active. Diclofenac Sodium (1% Gel, apply to shoulder Transdermal four times daily) Active. Irbesartan (300MG Tablet, 1/2 Oral daily) Active. Pataday (0.2% Solution, 1 drop to eye Ophthalmic daily) Active. Probiotic (1 Oral daily) Specific strength unknown - Active. RABEprazole Sodium (20MG Tablet DR, 1 Oral as needed for reflux) Active. Vitamin B12 (100MCG Tablet, 1 Oral daily) Active. Metoprolol Succinate ER (50MG Tablet ER 24HR, 1 Oral daily) Active. TraMADol HCl (50MG Tablet, 1/2 Oral as needed) Active.  Diagnostic Studies History (April Louretta Shorten; 18-Oct-2017 4:30 PM) Holter Monitor [07/10/2017]: 48 Hours: Minimum HR of 31 bpm at 12:42 pm, There were three significant pauces of > 3.2 seconds, longest 5.4 second pause, complete heart block. Occaisonal PVC, Brief 3-5 beat atrial tachycardia. Echocardiogram [07/15/2017]: Left ventricle: The cavity size was normal. Systolic  function was vigorous. The estimated ejection fraction was in the range of 65% to 70%. Wall motion was normal; there were no regional wall motion abnormalities. The study is not technically sufficient to allow evaluation of LV diastolic function. - Left atrium: The atrium was mildly dilated. - Atrial septum: No defect or patent foramen ovale was identified.    Review of Systems Joya Gaskins Teagyn Fishel MD; 10-18-2017 9:09 PM) General Not Present- Appetite Loss and Weight Gain. Respiratory Not Present- Chronic Cough and Wakes up from Sleep Wheezing or Short of Breath. Cardiovascular Present- Chest Pain (Exertional only) and Difficulty Breathing On Exertion. Not Present- Edema. Gastrointestinal Not Present- Black, Tarry Stool and Difficulty Swallowing. Musculoskeletal Present- Decreased Range of Motion (Left shoulder) and Joint Stiffness (Left shoulder). Not Present- Muscle Atrophy. Neurological Not Present- Attention Deficit and Fainting. Note: Episdoes of lightheadedness Psychiatric Not Present- Personality Changes and Suicidal Ideation. Endocrine Not Present- Cold Intolerance and Heat Intolerance. Hematology Not Present- Abnormal Bleeding. All other systems negative  Vitals (April Garrison; 10-18-17 4:58 PM) 10/18/17 4:53 PM Weight: 179 lb Height: 59in Body Surface Area: 1.76 m Body Mass Index: 36.15 kg/m  Pulse: 78 (Regular)  P.OX: 98% (Room air) BP: 132/72 (Sitting, Left Arm, Standard)       Physical Exam Joya Gaskins Zev Blue MD; 10/18/17 9:09 PM) General Mental Status-Alert. General Appearance-Cooperative and Appears stated age. Build & Nutrition-Moderately obese.  Head and Neck Thyroid Gland Characteristics - normal size and consistency and no palpable nodules. Note: Prominent right carotid oulsations. No bruit on exam   Chest and Lung Exam Chest and lung exam reveals -quiet, even and easy respiratory effort with no use of accessory muscles,  non-tender and on auscultation, normal breath sounds, no adventitious sounds. Note: Pacemaker insertion site with steri-Strips without any bleeding or hematoma. There are signs of skin abrasion around the pacemaker site, possible related to adhesive dressing   Cardiovascular Cardiovascular  examination reveals -normal heart sounds, regular rate and rhythm with no murmurs, carotid auscultation reveals no bruits, abdominal aorta auscultation reveals no bruits and no prominent pulsation and femoral artery auscultation bilaterally reveals normal pulses, no bruits, no thrills. Note: Pacemaker pocket scar  Abdomen Palpation/Percussion Normal exam - Non Tender and No hepatosplenomegaly.  Peripheral Vascular Lower Extremity Palpation - Dorsalis pedis pulse - Bilateral - 2+. Carotid arteries - Bilateral-No Carotid bruit.  Neurologic Neurologic evaluation reveals -alert and oriented x 3 with no impairment of recent or remote memory. Motor-Grossly intact without any focal deficits.  Musculoskeletal Global Assessment Left Lower Extremity - no deformities, masses or tenderness, no known fractures. Right Lower Extremity - no deformities, masses or tenderness, no known fractures.    Assessment & Plan Joya Gaskins Cicily Bonano MD; 10/15/2017 9:12 PM) Abnormal stress test (R94.39) Current Plans Changed Metoprolol Succinate ER 50MG, 1 (one and a half) Tablet daily, #90, 60 days starting 10/15/2017, Ref. x4. Changed Aspirin 81 81MG, 1 (one) Tablet once daily, #60, 60 days starting 10/15/2017, Ref. x2. MRI safe cardiac pacemaker in situ (Z95.0) Story: Unscheduled pacemaker interrogation 08/11/2017: Medtronic dual-chamber pacemaker DDD. lower rate 60 bpm. Nearly 100% ventricular pacing. 8% atrial pacing. No significant events. Underlying rhyhtm 2:1 AV block with long PR interval 270 ms Switched to AAI<=>DDD to reduce unnecessary RV pacing Paced AV 180 msec, sensed AV 150 sec with maximum AV  interval limit set at 350 msec  EKG 08/11/2017: Sinus rhythm 80 bpm. Normal axis. Left bundle branch block or V-Paced rhythm.  Medtronic Azure XT DR MRI Sure Scan (serial number [id number]) pacemaker 07/16/2017 by [facility] for complete heart block. Impression: Scheduled in person pacemaker interrogation 10/15/2017: Medtronic dual chamber pacemaker: Mode switched from AAI<=>DDD to DDD Nearly 100% ventricular paced 1 episode of 11 beat nonsustained ventral tachycardia 08/26/2017 Current Plans Reprogramming, cardiac pacemaker, dual chamber (93280) Benign essential hypertension (I10) Chest tightness (R07.89) Story: Lexiscan myoview stress test 08/04/2017: 1. Non-diagnostic due to paced rhythm. 2. Study quality: good. The left ventricular chamber dimensions are normal. There is medium scar of the inferolateral segment with moderate to severe residual ischemia. There is small scar of the inferior segment with very mild residual ischemia. 3. Systolic function is mildly reduced. The calculated stress EF is at 44 %. Gated SPECT imaging demonstrates hypokinesis in the inferior, lateral segment(s). 5. Intermediate risk study. Clinical correlation recommended. Future Plans 54/27/0623: METABOLIC PANEL, BASIC (76283) - one time 10/30/2017: CBC & PLATELETS (AUTO) (15176) - one time 10/30/2017: PT (PROTHROMBIN TIME) (16073) - one time Hyperlipidemia, mixed (E78.2)  Note:.  Recommendations:  77 y/o AAF w/hypertension, recent Medtronic dual chamber pacemaker placement 07/2017, exertional chest pain and dyspnea, abnormal stress test, now episode of NSVT.  Previously treated medically after discussion with the patient. Now with the episdoe of NSVT, I recommend that patient undergo coronary angiography to evalute for possible severe coronary artery disease, with possible angioplasty. Increase metoprolol succinate to 75 mg daily given her recent NSVT> COnitnue ASA/statin/amlodipine/irbesartan.  Schedule  for cardiac catheterization, and possible angioplasty. We discussed regarding risks, benefits, alternatives to this including stress testing, CTA and continued medical therapy. Patient wants to proceed. Understands <1-2% risk of death, stroke, MI, urgent CABG, bleeding, infection, renal failure but not limited to these. Video recording of the procedure shown to the patient. This was a greater than 40 minute office visit with greater than 50% of the time spent with face-to-face encounter with patient and evaluation of complex medical issues, review of external records  and coordination of care.  MVP feature on pacemaker turnedd off given nearly 100% RV pacing without any significant AV conduction.  Cc Merrilee Seashore, MD  Signed electronically by Holly Leep, MD (10/15/2017 9:12 PM)

## 2017-11-08 NOTE — Progress Notes (Signed)
Labs 11/06/2017: BUN/Cr 14/0.95. eGFR 67 Na 139, K 4.5 H/H 10.9/33.6. MCV 87. Platelets 193 INR 1.0

## 2017-11-10 DIAGNOSIS — H04123 Dry eye syndrome of bilateral lacrimal glands: Secondary | ICD-10-CM | POA: Diagnosis not present

## 2017-11-10 DIAGNOSIS — H40023 Open angle with borderline findings, high risk, bilateral: Secondary | ICD-10-CM | POA: Diagnosis not present

## 2017-11-10 DIAGNOSIS — H1013 Acute atopic conjunctivitis, bilateral: Secondary | ICD-10-CM | POA: Diagnosis not present

## 2017-11-11 ENCOUNTER — Ambulatory Visit (HOSPITAL_COMMUNITY)
Admission: RE | Disposition: A | Discharge status: Home / Self Care | Payer: Self-pay | Source: Ambulatory Visit | Attending: Cardiology

## 2017-11-11 ENCOUNTER — Ambulatory Visit (HOSPITAL_COMMUNITY)
Admission: RE | Admit: 2017-11-11 | Discharge: 2017-11-11 | Disposition: A | Discharge status: Home / Self Care | Payer: PPO | Source: Ambulatory Visit | Attending: Cardiology | Admitting: Cardiology

## 2017-11-11 DIAGNOSIS — R9439 Abnormal result of other cardiovascular function study: Secondary | ICD-10-CM

## 2017-11-11 DIAGNOSIS — E782 Mixed hyperlipidemia: Secondary | ICD-10-CM | POA: Insufficient documentation

## 2017-11-11 DIAGNOSIS — I6340 Cerebral infarction due to embolism of unspecified cerebral artery: Secondary | ICD-10-CM | POA: Diagnosis not present

## 2017-11-11 DIAGNOSIS — Z79899 Other long term (current) drug therapy: Secondary | ICD-10-CM | POA: Diagnosis not present

## 2017-11-11 DIAGNOSIS — I10 Essential (primary) hypertension: Secondary | ICD-10-CM | POA: Diagnosis not present

## 2017-11-11 DIAGNOSIS — Z8249 Family history of ischemic heart disease and other diseases of the circulatory system: Secondary | ICD-10-CM | POA: Insufficient documentation

## 2017-11-11 DIAGNOSIS — Z950 Presence of cardiac pacemaker: Secondary | ICD-10-CM | POA: Insufficient documentation

## 2017-11-11 DIAGNOSIS — R0602 Shortness of breath: Secondary | ICD-10-CM | POA: Diagnosis not present

## 2017-11-11 DIAGNOSIS — Z7982 Long term (current) use of aspirin: Secondary | ICD-10-CM | POA: Insufficient documentation

## 2017-11-11 DIAGNOSIS — Q211 Atrial septal defect: Secondary | ICD-10-CM | POA: Diagnosis not present

## 2017-11-11 DIAGNOSIS — K219 Gastro-esophageal reflux disease without esophagitis: Secondary | ICD-10-CM | POA: Insufficient documentation

## 2017-11-11 DIAGNOSIS — I2510 Atherosclerotic heart disease of native coronary artery without angina pectoris: Secondary | ICD-10-CM | POA: Diagnosis not present

## 2017-11-11 DIAGNOSIS — Z79891 Long term (current) use of opiate analgesic: Secondary | ICD-10-CM | POA: Diagnosis not present

## 2017-11-11 DIAGNOSIS — I472 Ventricular tachycardia: Secondary | ICD-10-CM | POA: Insufficient documentation

## 2017-11-11 DIAGNOSIS — R0789 Other chest pain: Secondary | ICD-10-CM | POA: Insufficient documentation

## 2017-11-11 DIAGNOSIS — Z8673 Personal history of transient ischemic attack (TIA), and cerebral infarction without residual deficits: Secondary | ICD-10-CM | POA: Diagnosis not present

## 2017-11-11 HISTORY — PX: LEFT HEART CATH AND CORONARY ANGIOGRAPHY: CATH118249

## 2017-11-11 SURGERY — LEFT HEART CATH AND CORONARY ANGIOGRAPHY
Anesthesia: LOCAL

## 2017-11-11 MED ORDER — LIDOCAINE HCL (PF) 1 % IJ SOLN
INTRAMUSCULAR | Status: AC
  Filled 2017-11-11: qty 30

## 2017-11-11 MED ORDER — IOPAMIDOL (ISOVUE-370) INJECTION 76%
INTRAVENOUS | Status: DC | PRN
  Administered 2017-11-11: 75 mL via INTRA_ARTERIAL

## 2017-11-11 MED ORDER — SODIUM CHLORIDE 0.9 % IV SOLN
INTRAVENOUS | Status: DC
  Administered 2017-11-11: 11:00:00 via INTRAVENOUS

## 2017-11-11 MED ORDER — HEPARIN (PORCINE) IN NACL 2-0.9 UNIT/ML-% IJ SOLN
INTRAMUSCULAR | Status: AC | PRN
  Administered 2017-11-11: 1000 mL

## 2017-11-11 MED ORDER — HEPARIN (PORCINE) IN NACL 2-0.9 UNIT/ML-% IJ SOLN
INTRAMUSCULAR | Status: AC
  Filled 2017-11-11: qty 1000

## 2017-11-11 MED ORDER — ACETAMINOPHEN 325 MG PO TABS: 650.0000 mg | ORAL_TABLET | ORAL | Status: DC | PRN

## 2017-11-11 MED ORDER — IOPAMIDOL (ISOVUE-370) INJECTION 76%
INTRAVENOUS | Status: AC
  Filled 2017-11-11: qty 100

## 2017-11-11 MED ORDER — SODIUM CHLORIDE 0.9% FLUSH: 3.0000 mL | Freq: Two times a day (BID) | INTRAVENOUS | Status: DC

## 2017-11-11 MED ORDER — FENTANYL CITRATE (PF) 100 MCG/2ML IJ SOLN
INTRAMUSCULAR | Status: AC
  Filled 2017-11-11: qty 2

## 2017-11-11 MED ORDER — SODIUM CHLORIDE 0.9 % IV SOLN: 250.0000 mL | INTRAVENOUS | Status: DC | PRN

## 2017-11-11 MED ORDER — MIDAZOLAM HCL 2 MG/2ML IJ SOLN
INTRAMUSCULAR | Status: AC
  Filled 2017-11-11: qty 2

## 2017-11-11 MED ORDER — FENTANYL CITRATE (PF) 100 MCG/2ML IJ SOLN
INTRAMUSCULAR | Status: AC
  Administered 2017-11-11: 50 ug via INTRAVENOUS
  Filled 2017-11-11: qty 2

## 2017-11-11 MED ORDER — FENTANYL CITRATE (PF) 100 MCG/2ML IJ SOLN
50.0000 ug | Freq: Once | INTRAMUSCULAR | Status: AC
  Administered 2017-11-11: 50 ug via INTRAVENOUS

## 2017-11-11 MED ORDER — SODIUM CHLORIDE 0.9% FLUSH: 3.0000 mL | INTRAVENOUS | Status: DC | PRN

## 2017-11-11 MED ORDER — ASPIRIN 81 MG PO CHEW: 81.0000 mg | CHEWABLE_TABLET | ORAL | Status: DC

## 2017-11-11 MED ORDER — SODIUM CHLORIDE 0.9 % IV SOLN: INTRAVENOUS | Status: AC

## 2017-11-11 MED ORDER — LIDOCAINE HCL (PF) 1 % IJ SOLN
INTRAMUSCULAR | Status: DC | PRN
  Administered 2017-11-11: 2 mL
  Administered 2017-11-11: 15 mL

## 2017-11-11 MED ORDER — MIDAZOLAM HCL 2 MG/2ML IJ SOLN
INTRAMUSCULAR | Status: DC | PRN
  Administered 2017-11-11 (×2): 1 mg via INTRAVENOUS

## 2017-11-11 MED ORDER — VERAPAMIL HCL 2.5 MG/ML IV SOLN
INTRAVENOUS | Status: AC
  Filled 2017-11-11: qty 2

## 2017-11-11 MED ORDER — HEPARIN SODIUM (PORCINE) 1000 UNIT/ML IJ SOLN
INTRAMUSCULAR | Status: AC
  Filled 2017-11-11: qty 1

## 2017-11-11 MED ORDER — VERAPAMIL HCL 2.5 MG/ML IV SOLN
INTRAVENOUS | Status: DC | PRN
  Administered 2017-11-11: 10 mL via INTRA_ARTERIAL

## 2017-11-11 MED ORDER — FENTANYL CITRATE (PF) 100 MCG/2ML IJ SOLN
INTRAMUSCULAR | Status: DC | PRN
  Administered 2017-11-11 (×4): 25 ug via INTRAVENOUS

## 2017-11-11 MED ORDER — ONDANSETRON HCL 4 MG/2ML IJ SOLN: 4.0000 mg | Freq: Four times a day (QID) | INTRAMUSCULAR | Status: DC | PRN

## 2017-11-11 SURGICAL SUPPLY — 16 items
CATH 5FR JL3.5 JR4 ANG PIG MP (CATHETERS) ×2 IMPLANT
CATH INFINITI 5FR JL4 (CATHETERS) ×2 IMPLANT
COVER PRB 48X5XTLSCP FOLD TPE (BAG) ×1 IMPLANT
COVER PROBE 5X48 (BAG) ×1
DEVICE RAD COMP TR BAND LRG (VASCULAR PRODUCTS) ×2 IMPLANT
GLIDESHEATH SLEND SS 6F .021 (SHEATH) ×2 IMPLANT
GUIDEWIRE INQWIRE 1.5J.035X260 (WIRE) ×1 IMPLANT
INQWIRE 1.5J .035X260CM (WIRE) ×2
KIT HEART LEFT (KITS) ×2 IMPLANT
KIT MICROINTRODUCER STIFF 5F (SHEATH) ×2 IMPLANT
PACK CARDIAC CATHETERIZATION (CUSTOM PROCEDURE TRAY) ×2 IMPLANT
SHEATH PINNACLE 5F 10CM (SHEATH) ×2 IMPLANT
TRANSDUCER W/STOPCOCK (MISCELLANEOUS) ×2 IMPLANT
TUBING CIL FLEX 10 FLL-RA (TUBING) ×2 IMPLANT
WIRE EMERALD 3MM-J .035X150CM (WIRE) ×2 IMPLANT
WIRE HI TORQ VERSACORE-J 145CM (WIRE) ×2 IMPLANT

## 2017-11-11 NOTE — Progress Notes (Signed)
Up and tolerated well; left groin stable no bleeding or hematoma

## 2017-11-11 NOTE — Progress Notes (Signed)
Site area: left groin fa sheath Site Prior to Removal:  Level 0 Pressure Applied For:  20 minutes Manual:   yes Patient Status During Pull:  stable Post Pull Site:  Level  0 Post Pull Instructions Given:  yes Post Pull Pulses Present: palpable Dressing Applied:  Gauze and tegaderm Bedrest begins @ 6788 Comments:

## 2017-11-11 NOTE — Discharge Instructions (Signed)
Femoral Site Care °Refer to this sheet in the next few weeks. These instructions provide you with information about caring for yourself after your procedure. Your health care provider may also give you more specific instructions. Your treatment has been planned according to current medical practices, but problems sometimes occur. Call your health care provider if you have any problems or questions after your procedure. °What can I expect after the procedure? °After your procedure, it is typical to have the following: °· Bruising at the site that usually fades within 1-2 weeks. °· Blood collecting in the tissue (hematoma) that may be painful to the touch. It should usually decrease in size and tenderness within 1-2 weeks. ° °Follow these instructions at home: °· Take medicines only as directed by your health care provider. °· You may shower 24-48 hours after the procedure or as directed by your health care provider. Remove the bandage (dressing) and gently wash the site with plain soap and water. Pat the area dry with a clean towel. Do not rub the site, because this may cause bleeding. °· Do not take baths, swim, or use a hot tub until your health care provider approves. °· Check your insertion site every day for redness, swelling, or drainage. °· Do not apply powder or lotion to the site. °· Limit use of stairs to twice a day for the first 2-3 days or as directed by your health care provider. °· Do not squat for the first 2-3 days or as directed by your health care provider. °· Do not lift over 10 lb (4.5 kg) for 5 days after your procedure or as directed by your health care provider. °· Ask your health care provider when it is okay to: °? Return to work or school. °? Resume usual physical activities or sports. °? Resume sexual activity. °· Do not drive home if you are discharged the same day as the procedure. Have someone else drive you. °· You may drive 24 hours after the procedure unless otherwise instructed by  your health care provider. °· Do not operate machinery or power tools for 24 hours after the procedure or as directed by your health care provider. °· If your procedure was done as an outpatient procedure, which means that you went home the same day as your procedure, a responsible adult should be with you for the first 24 hours after you arrive home. °· Keep all follow-up visits as directed by your health care provider. This is important. °Contact a health care provider if: °· You have a fever. °· You have chills. °· You have increased bleeding from the site. Hold pressure on the site. °Get help right away if: °· You have unusual pain at the site. °· You have redness, warmth, or swelling at the site. °· You have drainage (other than a small amount of blood on the dressing) from the site. °· The site is bleeding, and the bleeding does not stop after 30 minutes of holding steady pressure on the site. °· Your leg or foot becomes pale, cool, tingly, or numb. °This information is not intended to replace advice given to you by your health care provider. Make sure you discuss any questions you have with your health care provider. °Document Released: 06/24/2014 Document Revised: 03/28/2016 Document Reviewed: 05/10/2014 °Elsevier Interactive Patient Education © 2018 Elsevier Inc[employer] ° °Radial Site Care °Refer to this sheet in the next few weeks. These instructions provide you with information about caring for yourself after your procedure. Your health   care provider may also give you more specific instructions. Your treatment has been planned according to current medical practices, but problems sometimes occur. Call your health care provider if you have any problems or questions after your procedure. °What can I expect after the procedure? °After your procedure, it is typical to have the following: °· Bruising at the radial site that usually fades within 1-2 weeks. °· Blood collecting in the tissue (hematoma) that may be  painful to the touch. It should usually decrease in size and tenderness within 1-2 weeks. ° °Follow these instructions at home: °· Take medicines only as directed by your health care provider. °· You may shower 24-48 hours after the procedure or as directed by your health care provider. Remove the bandage (dressing) and gently wash the site with plain soap and water. Pat the area dry with a clean towel. Do not rub the site, because this may cause bleeding. °· Do not take baths, swim, or use a hot tub until your health care provider approves. °· Check your insertion site every day for redness, swelling, or drainage. °· Do not apply powder or lotion to the site. °· Do not flex or bend the affected arm for 24 hours or as directed by your health care provider. °· Do not push or pull heavy objects with the affected arm for 24 hours or as directed by your health care provider. °· Do not lift over 10 lb (4.5 kg) for 5 days after your procedure or as directed by your health care provider. °· Ask your health care provider when it is okay to: °? Return to work or school. °? Resume usual physical activities or sports. °? Resume sexual activity. °· Do not drive home if you are discharged the same day as the procedure. Have someone else drive you. °· You may drive 24 hours after the procedure unless otherwise instructed by your health care provider. °· Do not operate machinery or power tools for 24 hours after the procedure. °· If your procedure was done as an outpatient procedure, which means that you went home the same day as your procedure, a responsible adult should be with you for the first 24 hours after you arrive home. °· Keep all follow-up visits as directed by your health care provider. This is important. °Contact a health care provider if: °· You have a fever. °· You have chills. °· You have increased bleeding from the radial site. Hold pressure on the site. °Get help right away if: °· You have unusual pain at the  radial site. °· You have redness, warmth, or swelling at the radial site. °· You have drainage (other than a small amount of blood on the dressing) from the radial site. °· The radial site is bleeding, and the bleeding does not stop after 30 minutes of holding steady pressure on the site. °· Your arm or hand becomes pale, cool, tingly, or numb. °This information is not intended to replace advice given to you by your health care provider. Make sure you discuss any questions you have with your health care provider. °Document Released: 11/23/2010 Document Revised: 03/28/2016 Document Reviewed: 05/09/2014 °Elsevier Interactive Patient Education © 2018 Elsevier Inc. ° ° °

## 2017-11-11 NOTE — Interval H&P Note (Signed)
History and Physical Interval Note:  11/11/2017 2:32 PM  Holly [name]  has presented today for surgery, with the diagnosis of shortness of breath - abnormal stress  The various methods of treatment have been discussed with the patient and family. After consideration of risks, benefits and other options for treatment, the patient has consented to  Procedure(s): LEFT HEART CATH AND CORONARY ANGIOGRAPHY (N/A) as a surgical intervention .  The patient's history has been reviewed, patient examined, no change in status, stable for surgery.  I have reviewed the patient's chart and labs.  Questions were answered to the patient's satisfaction.     2016/2017 Appropriate Use Criteria for Coronary Revascularization Clinical Presentation: Diabetes Mellitus? Symptom Status? S/P CABG? Antianginal Therapy (# of long-acting drugs)? Results of Non-invasive testing? FFR/iFR results in all diseased vessels? Patient undergoing renal transplant? Patient undergoing percutaneous valve procedure (TAVR, MitraClip, Others)? Symptom Status:  Ischemic Symptoms  Non-invasive Testing:  Intermediate Risk  If no or indeterminate stress test, FFR/iFR results in all diseased vessels:  N/A  Diabetes Mellitus:  No  S/P CABG:  No  Antianginal therapy (number of long-acting drugs):  >=2  Patient undergoing renal transplant:  No  Patient undergoing percutaneous valve procedure:  No    newline 1 Vessel Disease PCI CABG  No proximal LAD involvement, No proximal left dominant LCX involvement A (8); Indication 2 M (6); Indication 2   Proximal left dominant LCX involvement A (8); Indication 5 A (8); Indication 5   Proximal LAD involvement A (8); Indication 5 A (8); Indication 5   newline 2 Vessel Disease  No proximal LAD involvement A (8); Indication 8 A (7); Indication 8   Proximal LAD involvement A (8); Indication 11 A (8); Indication 11   newline 3 Vessel Disease  Low disease complexity (e.g., focal stenoses, SYNTAX <=22)  A (8); Indication 17 A (8); Indication 17   Intermediate or high disease complexity (e.g., SYNTAX >=23) M (6); Indication 21 A (9); Indication 21   newline Left Main Disease  Isolated LMCA disease: ostial or midshaft A (7); Indication 24 A (9); Indication 24   Isolated LMCA disease: bifurcation involvement M (6); Indication 25 A (9); Indication 25   LMCA ostial or midshaft, concurrent low disease burden multivessel disease (e.g., 1-2 additional focal stenoses, SYNTAX <=22) A (7); Indication 26 A (9); Indication 26   LMCA ostial or midshaft, concurrent intermediate or high disease burden multivessel disease (e.g., 1-2 additional bifurcation stenoses, long stenoses, SYNTAX >=23) M (4); Indication 27 A (9); Indication 27   LMCA bifurcation involvement, concurrent low disease burden multivessel disease (e.g., 1-2 additional focal stenoses, SYNTAX <=22) M (6); Indication 28 A (9); Indication 28   LMCA bifurcation involvement, concurrent intermediate or high disease burden multivessel disease (e.g., 1-2 additional bifurcation stenoses, long stenoses, SYNTAX >=23) R (3); Indication 29 A (9); Indication [place]

## 2017-11-12 ENCOUNTER — Encounter (HOSPITAL_COMMUNITY): Payer: Self-pay | Admitting: Cardiology

## 2017-11-12 MED FILL — Heparin Sodium (Porcine) Inj 1000 Unit/ML: INTRAMUSCULAR | Qty: 10 | Status: AC

## 2017-11-13 ENCOUNTER — Encounter (HOSPITAL_COMMUNITY): Payer: Self-pay | Admitting: Cardiology

## 2017-11-21 ENCOUNTER — Encounter (HOSPITAL_COMMUNITY): Payer: Self-pay | Admitting: Cardiology

## 2017-11-21 DIAGNOSIS — I472 Ventricular tachycardia: Secondary | ICD-10-CM | POA: Diagnosis not present

## 2017-11-21 DIAGNOSIS — R9439 Abnormal result of other cardiovascular function study: Secondary | ICD-10-CM | POA: Diagnosis not present

## 2017-11-21 DIAGNOSIS — R0789 Other chest pain: Secondary | ICD-10-CM | POA: Diagnosis not present

## 2017-11-21 DIAGNOSIS — I25118 Atherosclerotic heart disease of native coronary artery with other forms of angina pectoris: Secondary | ICD-10-CM | POA: Diagnosis not present

## 2017-11-24 DIAGNOSIS — I442 Atrioventricular block, complete: Secondary | ICD-10-CM | POA: Diagnosis not present

## 2017-11-24 DIAGNOSIS — Z950 Presence of cardiac pacemaker: Secondary | ICD-10-CM | POA: Diagnosis not present

## 2017-11-24 DIAGNOSIS — Z45018 Encounter for adjustment and management of other part of cardiac pacemaker: Secondary | ICD-10-CM | POA: Diagnosis not present

## 2017-12-10 DIAGNOSIS — R0989 Other specified symptoms and signs involving the circulatory and respiratory systems: Secondary | ICD-10-CM | POA: Diagnosis not present

## 2018-02-02 DIAGNOSIS — Z950 Presence of cardiac pacemaker: Secondary | ICD-10-CM | POA: Diagnosis not present

## 2018-02-02 DIAGNOSIS — I442 Atrioventricular block, complete: Secondary | ICD-10-CM | POA: Diagnosis not present

## 2018-02-02 DIAGNOSIS — Z45018 Encounter for adjustment and management of other part of cardiac pacemaker: Secondary | ICD-10-CM | POA: Diagnosis not present

## 2018-02-23 DIAGNOSIS — Z45018 Encounter for adjustment and management of other part of cardiac pacemaker: Secondary | ICD-10-CM | POA: Diagnosis not present

## 2018-02-23 DIAGNOSIS — Z950 Presence of cardiac pacemaker: Secondary | ICD-10-CM | POA: Diagnosis not present

## 2018-04-20 DIAGNOSIS — I10 Essential (primary) hypertension: Secondary | ICD-10-CM | POA: Diagnosis not present

## 2018-04-20 DIAGNOSIS — Z950 Presence of cardiac pacemaker: Secondary | ICD-10-CM | POA: Diagnosis not present

## 2018-04-20 DIAGNOSIS — Z45018 Encounter for adjustment and management of other part of cardiac pacemaker: Secondary | ICD-10-CM | POA: Diagnosis not present

## 2018-04-20 DIAGNOSIS — I2510 Atherosclerotic heart disease of native coronary artery without angina pectoris: Secondary | ICD-10-CM | POA: Diagnosis not present

## 2018-04-20 DIAGNOSIS — I471 Supraventricular tachycardia: Secondary | ICD-10-CM | POA: Diagnosis not present

## 2018-05-04 DIAGNOSIS — Z45018 Encounter for adjustment and management of other part of cardiac pacemaker: Secondary | ICD-10-CM | POA: Diagnosis not present

## 2018-05-04 DIAGNOSIS — I442 Atrioventricular block, complete: Secondary | ICD-10-CM | POA: Diagnosis not present

## 2018-05-04 DIAGNOSIS — Z950 Presence of cardiac pacemaker: Secondary | ICD-10-CM | POA: Diagnosis not present

## 2018-05-06 ENCOUNTER — Other Ambulatory Visit: Payer: Self-pay | Admitting: *Deleted

## 2018-05-06 DIAGNOSIS — Z961 Presence of intraocular lens: Secondary | ICD-10-CM | POA: Diagnosis not present

## 2018-05-06 DIAGNOSIS — H35363 Drusen (degenerative) of macula, bilateral: Secondary | ICD-10-CM | POA: Diagnosis not present

## 2018-05-06 DIAGNOSIS — H04123 Dry eye syndrome of bilateral lacrimal glands: Secondary | ICD-10-CM | POA: Diagnosis not present

## 2018-05-06 DIAGNOSIS — H40023 Open angle with borderline findings, high risk, bilateral: Secondary | ICD-10-CM | POA: Diagnosis not present

## 2018-05-06 NOTE — Patient Outreach (Addendum)
[facility]) Care Management  05/06/2018  [name] 1941-01-26 [id number]  Referral via HTA; Reason: Member is out of current prescription for heart.  Telephone call to patient who was advised of reason for call. Patient voices that she is currently driving and can not talk. States she will call back when she gets home.  Plan: Geophysicist/field seismologist. Follow up 2-4 business days.   Sherrin Daisy, RN BSN [place] Management Coordinator [facility] Care Management  [phone]

## 2018-05-08 ENCOUNTER — Encounter: Payer: Self-pay | Admitting: *Deleted

## 2018-05-13 ENCOUNTER — Other Ambulatory Visit: Payer: Self-pay | Admitting: *Deleted

## 2018-05-13 NOTE — Patient Outreach (Signed)
[facility]) Care Management  05/13/2018  Holly [name] [birth year] [id number]  Referral via HTA; Reason: Member is out of current prescription for heart.  Telephone call #2; phone rings with no answer, unable to leave message.  Plan. Follow up in 2-4 days. Outreach letter sent 7/3.  Sherrin Daisy, RN BSN [place] Management Coordinator [facility] Care Management  [phone]

## 2018-05-18 ENCOUNTER — Other Ambulatory Visit: Payer: Self-pay | Admitting: *Deleted

## 2018-05-18 NOTE — Patient Outreach (Signed)
[facility]) Care Management  05/18/2018  Holly [name] 01/22/1941 [id number]   Referral via HTA; Reason: Member is out of current prescription for heart.; Member is on LIS.  Return call from person who called in for patient who is requesting help with getting aide assistance for patient. States patient currently using walker(states one leg is shorter that other). Patient was available to take call. HIPPA verification received.  States she is able to get her bath but has trouble with putting on TED hose. States she has arthritis in left arm and right leg. Also states she has pacemaker and does attend cardiology appointments.   States currently she does have her heart medication. States each medication is $3.00 but sometimes she runs short on being able to get all of medication. States currently her major concern is trying to get aide assistance to help her out at home. States she currently does not have Medicaid that provides that service. States she applied some years ago and did not qualify because she was working at that time. States she needs information & assistance to apply for Medicaid. States she wants Education officer, museum to help her. Patient voices that she did want Pharmacy assistance at this time.  Plan: Refer to Education officer, museum for [employer] information-specifically requesting assistance with applying for Medicaid to get aide assistance.  Telephonic signing off.  Sherrin Daisy, RN BSN [place] Management Coordinator [facility] Care Management  [phone]

## 2018-05-19 ENCOUNTER — Other Ambulatory Visit: Payer: Self-pay

## 2018-05-19 NOTE — Patient Outreach (Signed)
[facility]) Care Management  05/19/2018  [place] 1941-10-20 [id number]   Initial outreach to Ms. Limbaugh regarding social work referral for assistance with Medicaid application.  Ms. Aydt reported that she has applied before but was employed at the time and was denied.  BSW mailed the application to her, encouraged her to complete/submit it as soon as possible, and informed her that it can take up to 90 days for [place] to process.  BSW will follow up with her next week to ensure receipt of application.  Ronn Melena, BSW Social Worker [phone]

## 2018-05-26 ENCOUNTER — Other Ambulatory Visit: Payer: Self-pay

## 2018-05-26 NOTE — Patient Outreach (Signed)
[facility]) Care Management  05/26/2018  Holly [name] [birth year] [id number]   Follow up call to [name] to ensure receipt of Medicaid application that was mailed to her.  [name] confirmed receipt and said that she intends to complete and mail it soon.  BSW is closing case at this time due to no other social work needs being identified.    Ronn Melena, BSW Social Worker [phone]

## 2018-07-10 DIAGNOSIS — I10 Essential (primary) hypertension: Secondary | ICD-10-CM | POA: Diagnosis not present

## 2018-07-10 DIAGNOSIS — I471 Supraventricular tachycardia: Secondary | ICD-10-CM | POA: Diagnosis not present

## 2018-07-10 DIAGNOSIS — I2510 Atherosclerotic heart disease of native coronary artery without angina pectoris: Secondary | ICD-10-CM | POA: Diagnosis not present

## 2018-07-10 DIAGNOSIS — Z950 Presence of cardiac pacemaker: Secondary | ICD-10-CM | POA: Diagnosis not present

## 2018-07-16 DIAGNOSIS — L03119 Cellulitis of unspecified part of limb: Secondary | ICD-10-CM | POA: Diagnosis not present

## 2018-07-16 DIAGNOSIS — M310 Hypersensitivity angiitis: Secondary | ICD-10-CM | POA: Diagnosis not present

## 2018-07-16 DIAGNOSIS — M25571 Pain in right ankle and joints of right foot: Secondary | ICD-10-CM | POA: Diagnosis not present

## 2018-07-16 DIAGNOSIS — L309 Dermatitis, unspecified: Secondary | ICD-10-CM | POA: Diagnosis not present

## 2018-07-16 DIAGNOSIS — R21 Rash and other nonspecific skin eruption: Secondary | ICD-10-CM | POA: Diagnosis not present

## 2018-07-16 DIAGNOSIS — M7989 Other specified soft tissue disorders: Secondary | ICD-10-CM | POA: Diagnosis not present

## 2018-08-03 DIAGNOSIS — Z950 Presence of cardiac pacemaker: Secondary | ICD-10-CM | POA: Diagnosis not present

## 2018-08-03 DIAGNOSIS — Z45018 Encounter for adjustment and management of other part of cardiac pacemaker: Secondary | ICD-10-CM | POA: Diagnosis not present

## 2018-08-03 DIAGNOSIS — I442 Atrioventricular block, complete: Secondary | ICD-10-CM | POA: Diagnosis not present

## 2018-08-04 DIAGNOSIS — L03119 Cellulitis of unspecified part of limb: Secondary | ICD-10-CM | POA: Diagnosis not present

## 2018-08-04 DIAGNOSIS — I952 Hypotension due to drugs: Secondary | ICD-10-CM | POA: Diagnosis not present

## 2018-08-04 DIAGNOSIS — L309 Dermatitis, unspecified: Secondary | ICD-10-CM | POA: Diagnosis not present

## 2018-08-04 DIAGNOSIS — I10 Essential (primary) hypertension: Secondary | ICD-10-CM | POA: Diagnosis not present

## 2018-08-06 DIAGNOSIS — I10 Essential (primary) hypertension: Secondary | ICD-10-CM | POA: Diagnosis not present

## 2018-08-06 DIAGNOSIS — I2510 Atherosclerotic heart disease of native coronary artery without angina pectoris: Secondary | ICD-10-CM | POA: Diagnosis not present

## 2018-08-06 DIAGNOSIS — R600 Localized edema: Secondary | ICD-10-CM | POA: Diagnosis not present

## 2018-08-06 DIAGNOSIS — I82401 Acute embolism and thrombosis of unspecified deep veins of right lower extremity: Secondary | ICD-10-CM | POA: Diagnosis not present

## 2018-08-19 DIAGNOSIS — I10 Essential (primary) hypertension: Secondary | ICD-10-CM | POA: Diagnosis not present

## 2018-08-19 DIAGNOSIS — L309 Dermatitis, unspecified: Secondary | ICD-10-CM | POA: Diagnosis not present

## 2018-08-19 DIAGNOSIS — R600 Localized edema: Secondary | ICD-10-CM | POA: Diagnosis not present

## 2018-08-19 DIAGNOSIS — Z23 Encounter for immunization: Secondary | ICD-10-CM | POA: Diagnosis not present

## 2018-09-07 DIAGNOSIS — Z0000 Encounter for general adult medical examination without abnormal findings: Secondary | ICD-10-CM | POA: Diagnosis not present

## 2018-09-07 DIAGNOSIS — I10 Essential (primary) hypertension: Secondary | ICD-10-CM | POA: Diagnosis not present

## 2018-09-07 DIAGNOSIS — E782 Mixed hyperlipidemia: Secondary | ICD-10-CM | POA: Diagnosis not present

## 2018-09-07 DIAGNOSIS — N390 Urinary tract infection, site not specified: Secondary | ICD-10-CM | POA: Diagnosis not present

## 2018-09-07 DIAGNOSIS — M8580 Other specified disorders of bone density and structure, unspecified site: Secondary | ICD-10-CM | POA: Diagnosis not present

## 2018-09-07 DIAGNOSIS — Z780 Asymptomatic menopausal state: Secondary | ICD-10-CM | POA: Diagnosis not present

## 2018-09-14 DIAGNOSIS — D500 Iron deficiency anemia secondary to blood loss (chronic): Secondary | ICD-10-CM | POA: Diagnosis not present

## 2018-09-14 DIAGNOSIS — M25512 Pain in left shoulder: Secondary | ICD-10-CM | POA: Diagnosis not present

## 2018-09-14 DIAGNOSIS — Z0000 Encounter for general adult medical examination without abnormal findings: Secondary | ICD-10-CM | POA: Diagnosis not present

## 2018-09-14 DIAGNOSIS — M25551 Pain in right hip: Secondary | ICD-10-CM | POA: Diagnosis not present

## 2018-09-14 DIAGNOSIS — J4520 Mild intermittent asthma, uncomplicated: Secondary | ICD-10-CM | POA: Diagnosis not present

## 2018-09-14 DIAGNOSIS — I2510 Atherosclerotic heart disease of native coronary artery without angina pectoris: Secondary | ICD-10-CM | POA: Diagnosis not present

## 2018-09-14 DIAGNOSIS — E782 Mixed hyperlipidemia: Secondary | ICD-10-CM | POA: Diagnosis not present

## 2018-09-14 DIAGNOSIS — I129 Hypertensive chronic kidney disease with stage 1 through stage 4 chronic kidney disease, or unspecified chronic kidney disease: Secondary | ICD-10-CM | POA: Diagnosis not present

## 2018-09-14 DIAGNOSIS — G609 Hereditary and idiopathic neuropathy, unspecified: Secondary | ICD-10-CM | POA: Diagnosis not present

## 2018-09-21 ENCOUNTER — Ambulatory Visit (INDEPENDENT_AMBULATORY_CARE_PROVIDER_SITE_OTHER): Payer: PPO | Admitting: Orthopedic Surgery

## 2018-09-28 ENCOUNTER — Ambulatory Visit (INDEPENDENT_AMBULATORY_CARE_PROVIDER_SITE_OTHER): Payer: PPO | Admitting: Orthopedic Surgery

## 2018-10-07 ENCOUNTER — Ambulatory Visit (INDEPENDENT_AMBULATORY_CARE_PROVIDER_SITE_OTHER): Payer: PPO | Admitting: Orthopedic Surgery

## 2018-10-07 ENCOUNTER — Ambulatory Visit (INDEPENDENT_AMBULATORY_CARE_PROVIDER_SITE_OTHER): Payer: PPO

## 2018-10-07 ENCOUNTER — Encounter (INDEPENDENT_AMBULATORY_CARE_PROVIDER_SITE_OTHER): Payer: Self-pay | Admitting: Orthopedic Surgery

## 2018-10-07 DIAGNOSIS — M25512 Pain in left shoulder: Secondary | ICD-10-CM

## 2018-10-07 DIAGNOSIS — M19212 Secondary osteoarthritis, left shoulder: Secondary | ICD-10-CM

## 2018-10-07 DIAGNOSIS — G8929 Other chronic pain: Secondary | ICD-10-CM

## 2018-10-07 DIAGNOSIS — M25551 Pain in right hip: Secondary | ICD-10-CM | POA: Diagnosis not present

## 2018-10-07 MED ORDER — BUPIVACAINE HCL 0.5 % IJ SOLN
9.0000 mL | INTRAMUSCULAR | Status: AC | PRN
  Administered 2018-10-07: 9 mL via INTRA_ARTICULAR

## 2018-10-07 MED ORDER — LIDOCAINE HCL 1 % IJ SOLN
5.0000 mL | INTRAMUSCULAR | Status: AC | PRN
  Administered 2018-10-07: 5 mL

## 2018-10-07 MED ORDER — METHYLPREDNISOLONE ACETATE 40 MG/ML IJ SUSP
40.0000 mg | INTRAMUSCULAR | Status: AC | PRN
  Administered 2018-10-07: 40 mg via INTRA_ARTICULAR

## 2018-10-07 NOTE — Progress Notes (Signed)
Office Visit Note   Patient: Holly [name]           Date of Birth: 11-18-1940           MRN: [id number] Visit Date: 10/07/2018 Requested by: Merrilee Seashore, [address], [place] [address] PCP: Merrilee Seashore, MD  Subjective: Chief Complaint  Patient presents with  . Left Shoulder - Pain  . Right Hip - Pain    HPI: Holly [name] is a patient with known bilateral hip arthritis as well as left shoulder rotator cuff arthropathy.  She is had pain in that left shoulder for over a year.  She reports some decreased functional ability with the left shoulder.  It has gotten worse over the last year.  She does have a pacemaker in that left upper chest region.  She describes bilateral hip pain as well.  She does get around with a walker.  Takes one aspirin a day.  Denies any fevers or chills.              ROS: All systems reviewed are negative as they relate to the chief complaint within the history of present illness.  Patient denies  fevers or chills.   Assessment & Plan: Visit Diagnoses:  1. Chronic left shoulder pain   2. Pain in right hip     Plan: Impression is end-stage left shoulder rotator cuff arthropathy with loss of glenoid bone stock.  I will think any options are available in that left shoulder.  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  Does not really want any surgery in that regard either.  For now I think we will inject that left shoulder as it has helped her in the past.  I will see her back as needed.  Follow-Up Instructions: Return if symptoms worsen or fail to improve.   Orders:  Orders Placed This Encounter  Procedures  . XR Shoulder Left  . XR HIP UNILAT W OR W/O PELVIS 2-3 VIEWS RIGHT   No orders of the defined types were placed in this encounter.     Procedures: Large Joint Inj: L  glenohumeral on 10/07/2018 11:46 AM Indications: diagnostic evaluation and pain Details: 18 G 1.5 in needle, posterior approach  Arthrogram: No  Medications: 9 mL bupivacaine 0.5 %; 40 mg methylPREDNISolone acetate 40 MG/ML; 5 mL lidocaine 1 % Outcome: tolerated well, no immediate complications Procedure, treatment alternatives, risks and benefits explained, specific risks discussed. Consent was given by the patient. Immediately prior to procedure a time out was called to verify the correct patient, procedure, equipment, support staff and site/side marked as required. Patient was prepped and draped in the usual sterile fashion.       Clinical Data: No additional findings.  Objective: Vital Signs: There were no vitals taken for this visit.  Physical Exam:   Constitutional: Patient appears well-developed HEENT:  Head: Normocephalic Eyes:EOM are normal Neck: Normal range of motion Cardiovascular: Normal rate Pulmonary/chest: Effort normal Neurologic: Patient is alert Skin: Skin is warm Psychiatric: Patient has normal mood and affect    Ortho Exam: Ortho exam demonstrates good cervical spine range of motion.  She does have a little bit of interosseous wasting on the right hand compared to the left.  She has diminished abduction and forward flexion of the left shoulder but no warmth to the shoulder.  Deltoid is  functional on the left-hand side.  Radial pulse intact bilaterally.  No lymphadenopathy in that left shoulder region.  Patient has diminished hip range of motion bilaterally with unequal leg lengths but she is able to walk.  Specialty Comments:  No specialty comments available.  Imaging: Xr Hip Unilat W Or W/o Pelvis 2-3 Views Right  Result Date: 10/07/2018 AP pelvis reviewed.  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  There is also severe arthritis present in both hips.  On the right side bone-on-bone changes with severe protrusio acetabuli is  present.  On the left-hand side there is flattening of the femoral head with end-stage arthritis present.  Xr Shoulder Left  Result Date: 10/07/2018 AP outlet axillary left shoulder reviewed.  End-stage glenohumeral arthritis is present with loss of sphericity of the humeral head.  Cardiac pacemaker in position.  There appears to be significant loss of glenoid bone stock.    PMFS History: Patient Active Problem List   Diagnosis Date Noted  . Abnormal stress test 11/08/2017  . 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 Carpal tunnel syndrome 08/09/2013   Past Medical History:  Diagnosis Date  . Bronchitis   . Hypertension     Family History  Problem Relation Age of Onset  . Hypertension Mother   . ALS Sister   . Heart attack Brother   . Diabetes Daughter     Past Surgical History:  Procedure Laterality Date  . LEFT HEART CATH AND CORONARY ANGIOGRAPHY N/A 11/11/2017   Procedure: LEFT HEART CATH AND CORONARY ANGIOGRAPHY;  Surgeon: Nigel Mormon, MD;  Location: [place] [facility];  Service: Cardiovascular;  Laterality: N/A;  . left leg surgery    . PACEMAKER IMPLANT N/A 07/15/2017   Procedure: Pacemaker Implant;  Surgeon: Constance Haw, MD;  Location: [place] [facility];  Service: Cardiovascular;  Laterality: N/A;   Social History   Occupational History  . Not on file  Tobacco Use  . Smoking status: Never Smoker  . Smokeless tobacco: Never Used  Substance and Sexual Activity  . Alcohol use: No  . Drug use: Not on file  . Sexual activity: Not on file

## 2018-10-14 DIAGNOSIS — D500 Iron deficiency anemia secondary to blood loss (chronic): Secondary | ICD-10-CM | POA: Diagnosis not present

## 2018-10-21 DIAGNOSIS — I129 Hypertensive chronic kidney disease with stage 1 through stage 4 chronic kidney disease, or unspecified chronic kidney disease: Secondary | ICD-10-CM | POA: Diagnosis not present

## 2018-10-21 DIAGNOSIS — D649 Anemia, unspecified: Secondary | ICD-10-CM | POA: Diagnosis not present

## 2018-10-21 DIAGNOSIS — I472 Ventricular tachycardia: Secondary | ICD-10-CM | POA: Diagnosis not present

## 2018-10-21 DIAGNOSIS — N183 Chronic kidney disease, stage 3 (moderate): Secondary | ICD-10-CM | POA: Diagnosis not present

## 2018-10-21 DIAGNOSIS — Z950 Presence of cardiac pacemaker: Secondary | ICD-10-CM | POA: Diagnosis not present

## 2018-10-21 DIAGNOSIS — I2510 Atherosclerotic heart disease of native coronary artery without angina pectoris: Secondary | ICD-10-CM | POA: Diagnosis not present

## 2018-10-21 DIAGNOSIS — I471 Supraventricular tachycardia: Secondary | ICD-10-CM | POA: Diagnosis not present

## 2018-11-02 DIAGNOSIS — I442 Atrioventricular block, complete: Secondary | ICD-10-CM | POA: Diagnosis not present

## 2018-11-02 DIAGNOSIS — Z45018 Encounter for adjustment and management of other part of cardiac pacemaker: Secondary | ICD-10-CM | POA: Diagnosis not present

## 2018-11-02 DIAGNOSIS — Z950 Presence of cardiac pacemaker: Secondary | ICD-10-CM | POA: Diagnosis not present

## 2018-11-16 DIAGNOSIS — H40023 Open angle with borderline findings, high risk, bilateral: Secondary | ICD-10-CM | POA: Diagnosis not present

## 2019-01-11 DIAGNOSIS — I2510 Atherosclerotic heart disease of native coronary artery without angina pectoris: Secondary | ICD-10-CM | POA: Diagnosis not present

## 2019-01-11 DIAGNOSIS — D649 Anemia, unspecified: Secondary | ICD-10-CM | POA: Diagnosis not present

## 2019-01-11 DIAGNOSIS — N183 Chronic kidney disease, stage 3 (moderate): Secondary | ICD-10-CM | POA: Diagnosis not present

## 2019-01-11 DIAGNOSIS — I129 Hypertensive chronic kidney disease with stage 1 through stage 4 chronic kidney disease, or unspecified chronic kidney disease: Secondary | ICD-10-CM | POA: Diagnosis not present

## 2019-01-14 DIAGNOSIS — I2510 Atherosclerotic heart disease of native coronary artery without angina pectoris: Secondary | ICD-10-CM | POA: Diagnosis not present

## 2019-01-14 DIAGNOSIS — I129 Hypertensive chronic kidney disease with stage 1 through stage 4 chronic kidney disease, or unspecified chronic kidney disease: Secondary | ICD-10-CM | POA: Diagnosis not present

## 2019-01-14 DIAGNOSIS — D638 Anemia in other chronic diseases classified elsewhere: Secondary | ICD-10-CM | POA: Diagnosis not present

## 2019-01-14 DIAGNOSIS — E782 Mixed hyperlipidemia: Secondary | ICD-10-CM | POA: Diagnosis not present

## 2019-01-21 NOTE — Progress Notes (Signed)
Patient is here for follow up visit.  Subjective:   _0  ID: Holly [name], female    DOB: [birth year], 78 y.o.   MRN: [id number]  Chief Complaint  Patient presents with  . Follow-up    3 mth    HPI  78 year old African American female with hypertension, dual-chamber pacemaker placement for AV block, moderate, medically managed coronary artery disease (LCx), hypertension, NSVT, here for follow up.  She has had lower blood pressures recently, having had to go down on BP medications. She gets especially lightheaded when she takes torsemide. Otherwise, she is doing well without any chest pain, shortness of breath symptoms.  Past Medical History:  Diagnosis Date  . Bronchitis   . Hypertension      Past Surgical History:  Procedure Laterality Date  . LEFT HEART CATH AND CORONARY ANGIOGRAPHY N/A 11/11/2017   Procedure: LEFT HEART CATH AND CORONARY ANGIOGRAPHY;  Surgeon: Nigel Mormon, MD;  Location: [place] [facility];  Service: Cardiovascular;  Laterality: N/A;  . left leg surgery    . PACEMAKER IMPLANT N/A 07/15/2017   Procedure: Pacemaker Implant;  Surgeon: Constance Haw, MD;  Location: [place] [facility];  Service: Cardiovascular;  Laterality: N/A;     Social History   Socioeconomic History  . Marital status: Divorced    Spouse name: Not on file  . Number of children: 7  . Years of education: Not on file  . Highest education level: Not on file  Occupational History  . Not on file  Social Needs  . Financial resource strain: Not on file  . Food insecurity:    Worry: Not on file    Inability: Not on file  . Transportation needs:    Medical: Not on file    Non-medical: Not on file  Tobacco Use  . Smoking status: Never Smoker  . Smokeless tobacco: Never Used  Substance and Sexual Activity  . Alcohol use: No  . Drug use: Not on file  . Sexual activity: Not on file  Lifestyle  . Physical activity:    Days per week: Not on file    Minutes per  session: Not on file  . Stress: Not on file  Relationships  . Social connections:    Talks on phone: Not on file    Gets together: Not on file    Attends religious service: Not on file    Active member of club or organization: Not on file    Attends meetings of clubs or organizations: Not on file    Relationship status: Not on file  . Intimate partner violence:    Fear of current or ex partner: Not on file    Emotionally abused: Not on file    Physically abused: Not on file    Forced sexual activity: Not on file  Other Topics Concern  . Not on file  Social History Narrative  . Not on file     Current Outpatient Medications on File Prior to Visit  Medication Sig Dispense Refill  . acetaminophen (TYLENOL) 325 MG tablet Take 325 mg by mouth every 4 (four) hours as needed.     . ALPRAZolam (XANAX) 0.25 MG tablet Take 1 tablet by mouth as needed.    Marland Kitchen aspirin EC 81 MG tablet Take 81 mg by mouth daily.    . B Complex-C-Calcium (QC B-COMPLEX/VITAMIN C PO) Take 1 tablet by mouth daily.    . Biotin 10000 MCG TABS Take 1 capsule by  mouth daily.    . Calcium Carbonate-Vitamin D3 (CALCIUM 600-D) 600-400 MG-UNIT TABS Take 1 tablet by mouth daily.    . diclofenac sodium (VOLTAREN) 1 % GEL Apply 4 g topically 4 (four) times daily. To shoulder (Patient taking differently: Apply 4 g topically daily as needed (pain). To shoulder) 1 Tube 1  . docusate sodium (COLACE) 50 MG capsule Take 50 mg by mouth as needed for mild constipation.    . EUCRISA 2 % OINT Apply 1 application topically daily.    Marland Kitchen losartan (COZAAR) 50 MG tablet Take 50 mg by mouth daily.    . metoprolol succinate (TOPROL-XL) 50 MG 24 hr tablet Take 25-50 mg by mouth 2 (two) times daily. Take 1 tablet (50 mg) in the morning & 0.5 tablet (25 mg) at 1800  4  . PATADAY 0.2 % SOLN Place 1 drop into both eyes daily as needed (for irritated eyes.).     Marland Kitchen Probiotic Product (PROBIOTIC PO) Take 1 capsule by mouth daily.     . RABEprazole  (ACIPHEX) 20 MG tablet Take 20 mg by mouth daily as needed (acid reflux).     . rosuvastatin (CRESTOR) 10 MG tablet Take 10 mg by mouth daily.    Marland Kitchen torsemide (DEMADEX) 20 MG tablet Take 1 tablet by mouth daily.    . cetirizine (ZYRTEC) 10 MG tablet Take 10 mg by mouth as needed.   5  . clobetasol ointment (TEMOVATE) 8.32 % Apply 1 application topically 2 (two) times daily as needed. For skin rash/irritation.  1  . Cyanocobalamin (VITAMIN B-12) 2500 MCG SUBL Place 2,500 mcg under the tongue daily.     No current facility-administered medications on file prior to visit.     Cardiovascular studies:  EKG 01/22/2019: Sinus rhythm with ventricular pacing.   Echocardiogram 08/06/2018: 1. Left ventricle cavity is normal in size. 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 Borderline normal global wall motion. Visual EF is 50-55%. Doppler evidence of grade I (impaired) diastolic dysfunction. Calculated EF 51%. 2. Left atrial cavity is moderately dilated. 3. Mild (Grade I) aortic regurgitation. 4. Moderate (Grade II) mitral regurgitation. 5. Moderate tricuspid regurgitation. No evidence of pulmonary hypertension. 6. Echoes of pacemaker lead are visible in right heart.  Lower extremity venous duplex 08/06/2018: No evidence of deep vein thrombosis of the lower extremities with normal venous return.  Lexiscan myoview stress test 08/04/2017: 1. Non-diagnostic due to paced rhythm. 2. Study quality: good. The left ventricular chamber dimensions are normal. There is medium scar of the inferolateral segment with moderate to severe residual ischemia. There is small scar of the inferior segment with very mild residual ischemia. 3. Systolic function is mildly reduced. The calculated stress EF is at 44 %. Gated SPECT imaging demonstrates hypokinesis in the inferior, lateral segment(s). 5. Intermediate risk study. Clinical correlation recommended.  Left Heart Cath and Coronary Angiogram  [11/11/2017]: Moderate coronary artery disease. She does have abnormal stress test in inferolateral territory, they are out of proportion to the coronary artery findings. Furthermore, she does not have any critical unstable lesion, to explain nonsustained ventricular tachycardia. She does not have any ongoing chest pain symptoms at this time. Recommend aggressive medical management at this time. If true anginal symptoms occur, could then perform PCI to mid circumflex.  Echocardiogram 07/15/2017: - Left ventricle: The cavity size was normal. Systolic function was vigorous. The estimated ejection fraction was in the range of 65% to 70%. Wall motion was normal; there were no regional  wall motion abnormalities. The study is not technically sufficient toallow evaluation of LV diastolic function. - Left atrium: The atrium was mildly dilated. - Atrial septum: No defect or patent foramen ovale was identified.  EKG 07/10/2018: Sinus rhythm 64 bpm. Ventricular paced rhythm.  Carotid artery duplex 12/10/2017: No hemodynamically significant arterial disease in the internal carotid artery bilaterally. Antegrade right vertebral artery flow. Antegrade left vertebral artery flow. Normal study.  Pacemaker Implant 07/16/2017: Medtronic Azure XT DR MRI Sure Scan (serial number [id number]) dual chamber pacemaker 07/16/2017 by [facility] for complete heart block.  Lower Extremity Venous Duplex Left [facility]le 04/16/2014: No evidence of deep vein or superficial thrombosis involving theleft lower extremity and right common femoral vein. No evidence of Baker&'s cyst on the left.  US Carotid Duplex Bilateral 01/25/2013: No evidence of hemodynamically significant carotid stenosis  Recent labs: 11/06/2017: BUN/Cr 14/0.95. eGFR 67 Na 139, K 4.5 H/H 10.9/33.6. MCV 87. Platelets 193 INR 1.0  09/04/2017: Glucose 111, BUNs/creatinine 11/1.14. EGFR 53. Cholesterol 133, HDL 64, LDL 58, triglyceride 57.   Lab Results   Component Value Date   TSH 1.987 07/15/2017    Review of Systems  Constitution: Negative for decreased appetite, malaise/fatigue, weight gain and weight loss.  HENT: Negative for congestion.   Eyes: Negative for visual disturbance.  Cardiovascular: Negative for chest pain, dyspnea on exertion, leg swelling, palpitations and syncope.  Respiratory: Negative for shortness of breath.   Endocrine: Negative for cold intolerance.  Hematologic/Lymphatic: Does not bruise/bleed easily.  Skin: Negative for itching and rash.  Musculoskeletal: Positive for stiffness (joints). Negative for myalgias.        Decreased Range of Motion (Right hip).  Gastrointestinal: Negative for abdominal pain, nausea and vomiting.  Genitourinary: Negative for dysuria.  Neurological: Negative for dizziness and weakness.  Psychiatric/Behavioral: The patient is not nervous/anxious.   All other systems reviewed and are negative.      Objective:    Vitals:   01/22/19 0954  BP: 138/74  Pulse: 77  SpO2: 96%     Physical Exam  Constitutional: She is oriented to person, place, and time. She appears well-developed and well-nourished. No distress.  HENT:  Head: Normocephalic and atraumatic.  Eyes: Pupils are equal, round, and reactive to light. Conjunctivae are normal.  Neck: No JVD present.  Cardiovascular: Normal rate and regular rhythm.  No murmur heard. Pulmonary/Chest: Effort normal and breath sounds normal. She has no wheezes. She has no rales.  Pacemaker pocket scar  Abdominal: Soft. Bowel sounds are normal. There is no rebound.  Lymphadenopathy:    She has no cervical adenopathy.  Neurological: She is alert and oriented to person, place, and time. No cranial nerve deficit.  Skin: Skin is warm and dry.  Psychiatric: She has a normal mood and affect.  Nursing note and vitals reviewed.       Assessment & Recommendations:   78 year old [place] American female with hypertension, dual-chamber  pacemaker placement for AV block, moderate, medically managed coronary artery disease (LCx), hypertension, NSVT, here for follow up.  H/o NSVT  No angina symptoms at this time. Moderate nonobstructive coronary artery disease on cath in 01.2019. No recent alerts on pacemaker.   Coronary artery disease: Stable. No angina symptoms.   Hypertension: Controlled. Has been running lower BP recently. Recommend using torsemide only as needed. Will consider amyloidosis in the differential in future. if this continues to be an issue.  Dual chamber pacemaker: Functioning well. Longevity >10 years.  I will see her back  in 3 months.  Nigel Mormon, MD [facility]. [place] Pager: [phone] Office: [phone] If no answer Cell [phone]

## 2019-01-22 ENCOUNTER — Ambulatory Visit (INDEPENDENT_AMBULATORY_CARE_PROVIDER_SITE_OTHER): Payer: Medicare Other | Admitting: Cardiology

## 2019-01-22 ENCOUNTER — Other Ambulatory Visit: Payer: Self-pay

## 2019-01-22 ENCOUNTER — Encounter: Payer: Self-pay | Admitting: Cardiology

## 2019-01-22 VITALS — BP 138/74 | HR 77 | Ht 59.0 in | Wt 152.0 lb

## 2019-01-22 DIAGNOSIS — I442 Atrioventricular block, complete: Secondary | ICD-10-CM

## 2019-01-22 DIAGNOSIS — I472 Ventricular tachycardia: Secondary | ICD-10-CM | POA: Diagnosis not present

## 2019-01-22 DIAGNOSIS — Z950 Presence of cardiac pacemaker: Secondary | ICD-10-CM

## 2019-01-22 DIAGNOSIS — I2510 Atherosclerotic heart disease of native coronary artery without angina pectoris: Secondary | ICD-10-CM | POA: Diagnosis not present

## 2019-01-22 DIAGNOSIS — I4729 Other ventricular tachycardia: Secondary | ICD-10-CM

## 2019-01-23 ENCOUNTER — Encounter: Payer: Self-pay | Admitting: Cardiology

## 2019-02-01 DIAGNOSIS — Z45018 Encounter for adjustment and management of other part of cardiac pacemaker: Secondary | ICD-10-CM | POA: Diagnosis not present

## 2019-02-01 DIAGNOSIS — I442 Atrioventricular block, complete: Secondary | ICD-10-CM | POA: Diagnosis not present

## 2019-02-01 DIAGNOSIS — Z950 Presence of cardiac pacemaker: Secondary | ICD-10-CM | POA: Diagnosis not present

## 2019-02-19 ENCOUNTER — Telehealth: Payer: Self-pay

## 2019-02-19 DIAGNOSIS — R600 Localized edema: Secondary | ICD-10-CM

## 2019-02-19 MED ORDER — TORSEMIDE 20 MG PO TABS: ORAL_TABLET | ORAL | 2 refills | Status: DC

## 2019-02-19 NOTE — Telephone Encounter (Signed)
Torsemide refilled

## 2019-04-05 ENCOUNTER — Ambulatory Visit: Payer: Medicare Other

## 2019-04-13 DIAGNOSIS — H40023 Open angle with borderline findings, high risk, bilateral: Secondary | ICD-10-CM | POA: Diagnosis not present

## 2019-04-13 DIAGNOSIS — H524 Presbyopia: Secondary | ICD-10-CM | POA: Diagnosis not present

## 2019-04-13 DIAGNOSIS — H04123 Dry eye syndrome of bilateral lacrimal glands: Secondary | ICD-10-CM | POA: Diagnosis not present

## 2019-04-13 DIAGNOSIS — Z961 Presence of intraocular lens: Secondary | ICD-10-CM | POA: Diagnosis not present

## 2019-04-13 DIAGNOSIS — H1045 Other chronic allergic conjunctivitis: Secondary | ICD-10-CM | POA: Diagnosis not present

## 2019-04-14 ENCOUNTER — Telehealth: Payer: Self-pay

## 2019-04-14 NOTE — Telephone Encounter (Signed)
Pt called today c/o feeling woozy and sob with some cp when she bends over; She does feel better, this happened this morning and she was a bit concerned.

## 2019-04-14 NOTE — Telephone Encounter (Signed)
Because she is on blood pressure medications, sometimes she can feel dizzy when she bends down and gets up.   try to do it slowly.  If symptoms getting worse to call us.

## 2019-04-14 NOTE — Telephone Encounter (Signed)
LMOM with details

## 2019-04-23 ENCOUNTER — Other Ambulatory Visit: Payer: Self-pay

## 2019-04-23 ENCOUNTER — Ambulatory Visit (INDEPENDENT_AMBULATORY_CARE_PROVIDER_SITE_OTHER): Payer: Medicare Other | Admitting: Cardiology

## 2019-04-23 VITALS — BP 136/74 | HR 70 | Wt 144.8 lb

## 2019-04-23 DIAGNOSIS — Z950 Presence of cardiac pacemaker: Secondary | ICD-10-CM

## 2019-04-23 DIAGNOSIS — I10 Essential (primary) hypertension: Secondary | ICD-10-CM | POA: Diagnosis not present

## 2019-04-23 DIAGNOSIS — I2510 Atherosclerotic heart disease of native coronary artery without angina pectoris: Secondary | ICD-10-CM

## 2019-04-23 NOTE — Progress Notes (Signed)
Patient is here for follow up visit.  Subjective:   _0  ID: Holly [name], female    DOB: Apr 15, 1941, 78 y.o.   MRN: [id number]  I connected with the patient on 04/23/2019 by a telephone call and verified that I am speaking with the correct person using two identifiers.     I offered the patient a video enabled application for a virtual visit. Unfortunately, this could not be accomplished due to technical difficulties/lack of video enabled phone/computer. I discussed the limitations of evaluation and management by telemedicine and the availability of in person appointments. The patient expressed understanding and agreed to proceed.   This visit type was conducted due to national recommendations for restrictions regarding the COVID-19 Pandemic (e.g. social distancing).  This format is felt to be most appropriate for this patient at this time.  All issues noted in this document were discussed and addressed.  No physical exam was performed (except for noted visual exam findings with Tele health visits).  The patient has consented to conduct a Tele health visit and understands insurance will be billed.   Chief Complaint  Patient presents with  . Coronary Artery Disease    HPI  78 year old African American female with hypertension, dual-chamber pacemaker placement for AV block, moderate, medically managed coronary artery disease (LCx), hypertension, NSVT, here for follow up.  At last visit in 01/2019, I recommended her to use torsemide only as needed, given her episodes of lightheadedness.    She has been doing well without any chest pain or shortness of breath. Blood pressure is well controlled. She has not had any lightheadedness episodes.  She has been having arthritic pain in left shoulder and right hip. She notes that her pacemaker has had blinking notification. There were no alert on pacemaker interrogation. Medtronic rep was contacted. She was asked to contact [phone] number to reset  the alarms.   Past Medical History:  Diagnosis Date  . Bronchitis   . Hypertension      Past Surgical History:  Procedure Laterality Date  . LEFT HEART CATH AND CORONARY ANGIOGRAPHY N/A 11/11/2017   Procedure: LEFT HEART CATH AND CORONARY ANGIOGRAPHY;  Surgeon: Nigel Mormon, MD;  Location: [facility];  Service: Cardiovascular;  Laterality: N/A;  . left leg surgery    . PACEMAKER IMPLANT N/A 07/15/2017   Procedure: Pacemaker Implant;  Surgeon: Constance Haw, MD;  Location: [place];  Service: Cardiovascular;  Laterality: N/A;     Social History   Socioeconomic History  . Marital status: Divorced    Spouse name: Not on file  . Number of children: 7  . Years of education: Not on file  . Highest education level: Not on file  Occupational History  . Not on file  Social Needs  . Financial resource strain: Not on file  . Food insecurity    Worry: Not on file    Inability: Not on file  . Transportation needs    Medical: Not on file    Non-medical: Not on file  Tobacco Use  . Smoking status: Never Smoker  . Smokeless tobacco: Never Used  Substance and Sexual Activity  . Alcohol use: No  . Drug use: Not on file  . Sexual activity: Not on file  Lifestyle  . Physical activity    Days per week: Not on file    Minutes per session: Not on file  . Stress: Not on file  Relationships  . Social connections  Talks on phone: Not on file    Gets together: Not on file    Attends religious service: Not on file    Active member of club or organization: Not on file    Attends meetings of clubs or organizations: Not on file    Relationship status: Not on file  . Intimate partner violence    Fear of current or ex partner: Not on file    Emotionally abused: Not on file    Physically abused: Not on file    Forced sexual activity: Not on file  Other Topics Concern  . Not on file  Social History Narrative  . Not on file     Current Outpatient  Medications on File Prior to Visit  Medication Sig Dispense Refill  . acetaminophen (TYLENOL) 325 MG tablet Take 325 mg by mouth every 4 (four) hours as needed.     . ALPRAZolam (XANAX) 0.25 MG tablet Take 1 tablet by mouth as needed.    Marland Kitchen aspirin EC 81 MG tablet Take 81 mg by mouth daily.    . B Complex-C-Calcium (QC B-COMPLEX/VITAMIN C PO) Take 1 tablet by mouth daily.    . Biotin 10000 MCG TABS Take 1 capsule by mouth daily.    . Calcium Carbonate-Vitamin D3 (CALCIUM 600-D) 600-400 MG-UNIT TABS Take 1 tablet by mouth daily.    . cetirizine (ZYRTEC) 10 MG tablet Take 10 mg by mouth as needed.   5  . clobetasol ointment (TEMOVATE) 7.93 % Apply 1 application topically 2 (two) times daily as needed. For skin rash/irritation.  1  . Cyanocobalamin (VITAMIN B-12) 2500 MCG SUBL Place 2,500 mcg under the tongue daily.    . diclofenac sodium (VOLTAREN) 1 % GEL Apply 4 g topically 4 (four) times daily. To shoulder (Patient taking differently: Apply 4 g topically daily as needed (pain). To shoulder) 1 Tube 1  . docusate sodium (COLACE) 50 MG capsule Take 50 mg by mouth as needed for mild constipation.    . EUCRISA 2 % OINT Apply 1 application topically daily.    Marland Kitchen losartan (COZAAR) 50 MG tablet Take 50 mg by mouth daily.    . metoprolol succinate (TOPROL-XL) 50 MG 24 hr tablet Take 25-50 mg by mouth 2 (two) times daily. Take 1 tablet (50 mg) in the morning & 0.5 tablet (25 mg) at 1800  4  . PATADAY 0.2 % SOLN Place 1 drop into both eyes daily as needed (for irritated eyes.).     Marland Kitchen Probiotic Product (PROBIOTIC PO) Take 1 capsule by mouth daily.     . RABEprazole (ACIPHEX) 20 MG tablet Take 20 mg by mouth daily as needed (acid reflux).     . rosuvastatin (CRESTOR) 10 MG tablet Take 10 mg by mouth daily.    Marland Kitchen torsemide (DEMADEX) 20 MG tablet Use only if needed for leg edema. Do not need to take it everyday. 60 tablet 2   No current facility-administered medications on file prior to visit.      Cardiovascular studies:  EKG 01/22/2019: Sinus rhythm with ventricular pacing.   Echocardiogram 08/06/2018: 1. Left ventricle cavity is normal in size. 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 Borderline normal global wall motion. Visual EF is 50-55%. Doppler evidence of grade I (impaired) diastolic dysfunction. Calculated EF 51%. 2. Left atrial cavity is moderately dilated. 3. Mild (Grade I) aortic regurgitation. 4. Moderate (Grade II) mitral regurgitation. 5. Moderate tricuspid regurgitation. No evidence of pulmonary hypertension. 6. Echoes of  pacemaker lead are visible in right heart.  Lower extremity venous duplex 08/06/2018: No evidence of deep vein thrombosis of the lower extremities with normal venous return.  Lexiscan myoview stress test 08/04/2017: 1. Non-diagnostic due to paced rhythm. 2. Study quality: good. The left ventricular chamber dimensions are normal. There is medium scar of the inferolateral segment with moderate to severe residual ischemia. There is small scar of the inferior segment with very mild residual ischemia. 3. Systolic function is mildly reduced. The calculated stress EF is at 44 %. Gated SPECT imaging demonstrates hypokinesis in the inferior, lateral segment(s). 5. Intermediate risk study. Clinical correlation recommended.  Left Heart Cath and Coronary Angiogram [11/11/2017]: Moderate coronary artery disease. She does have abnormal stress test in inferolateral territory, they are out of proportion to the coronary artery findings. Furthermore, she does not have any critical unstable lesion, to explain nonsustained ventricular tachycardia. She does not have any ongoing chest pain symptoms at this time. Recommend aggressive medical management at this time. If true anginal symptoms occur, could then perform PCI to mid circumflex.  Pacemaker Implant 07/16/2017: Medtronic Azure XT DR MRI Sure Scan (serial number [id number]) dual chamber pacemaker  07/16/2017 by [facility] for complete heart block.  Recent labs: 11/06/2017: BUN/Cr 14/0.95. eGFR 67 Na 139, K 4.5 H/H 10.9/33.6. MCV 87. Platelets 193 INR 1.0  09/04/2017: Glucose 111, BUNs/creatinine 11/1.14. EGFR 53. Cholesterol 133, HDL 64, LDL 58, triglyceride 57.   Lab Results  Component Value Date   TSH 1.987 07/15/2017    Review of Systems  Constitution: Negative for decreased appetite, malaise/fatigue, weight gain and weight loss.  HENT: Negative for congestion.   Eyes: Negative for visual disturbance.  Cardiovascular: Negative for chest pain, dyspnea on exertion, leg swelling, palpitations and syncope.  Respiratory: Negative for shortness of breath.   Endocrine: Negative for cold intolerance.  Hematologic/Lymphatic: Does not bruise/bleed easily.  Skin: Negative for itching and rash.  Musculoskeletal: Positive for arthritis and stiffness (joints). Negative for myalgias.        Decreased Range of Motion (Right hip).  Gastrointestinal: Negative for abdominal pain, nausea and vomiting.  Genitourinary: Negative for dysuria.  Neurological: Negative for dizziness and weakness.  Psychiatric/Behavioral: The patient is not nervous/anxious.   All other systems reviewed and are negative.      Objective:    Vitals:   04/23/19 0945  BP: 136/74  Pulse: 70     Physical Exam  Not performed. Telephone visit.       Assessment & Recommendations:   78 year old [place] American female with hypertension, dual-chamber pacemaker placement for AV block, moderate, medically managed coronary artery disease (LCx), hypertension, NSVT, here for follow up.   Coronary artery disease: Stable. No angina symptoms.  Continue Aspirin, statin   Hypertension: Controlled.  Dual chamber pacemaker: Functioning well. Longevity >10 years. No alerts to suggest pacemaker malfunction. Will continue remote monitoring.  Office f/u in 6-9 months with in person pacemaker check.    Nigel Mormon, MD [facility] Pager: [phone] Office: [phone] If no answer Cell [phone]

## 2019-04-24 ENCOUNTER — Encounter: Payer: Self-pay | Admitting: Cardiology

## 2019-04-24 DIAGNOSIS — Z950 Presence of cardiac pacemaker: Secondary | ICD-10-CM

## 2019-04-24 DIAGNOSIS — I2510 Atherosclerotic heart disease of native coronary artery without angina pectoris: Secondary | ICD-10-CM | POA: Insufficient documentation

## 2019-04-24 DIAGNOSIS — I10 Essential (primary) hypertension: Secondary | ICD-10-CM | POA: Insufficient documentation

## 2019-04-24 DIAGNOSIS — I25118 Atherosclerotic heart disease of native coronary artery with other forms of angina pectoris: Secondary | ICD-10-CM | POA: Insufficient documentation

## 2019-04-24 HISTORY — DX: Presence of cardiac pacemaker: Z95.0

## 2019-05-03 DIAGNOSIS — Z950 Presence of cardiac pacemaker: Secondary | ICD-10-CM | POA: Diagnosis not present

## 2019-05-03 DIAGNOSIS — I442 Atrioventricular block, complete: Secondary | ICD-10-CM | POA: Diagnosis not present

## 2019-05-03 DIAGNOSIS — Z45018 Encounter for adjustment and management of other part of cardiac pacemaker: Secondary | ICD-10-CM | POA: Diagnosis not present

## 2019-05-12 ENCOUNTER — Encounter: Payer: Self-pay | Admitting: Cardiology

## 2019-05-12 DIAGNOSIS — Z7189 Other specified counseling: Secondary | ICD-10-CM | POA: Insufficient documentation

## 2019-05-12 DIAGNOSIS — Z45018 Encounter for adjustment and management of other part of cardiac pacemaker: Secondary | ICD-10-CM

## 2019-05-12 HISTORY — DX: Encounter for adjustment and management of other part of cardiac pacemaker: Z45.018

## 2019-06-02 ENCOUNTER — Other Ambulatory Visit: Payer: Self-pay

## 2019-06-02 MED ORDER — METOPROLOL SUCCINATE ER 50 MG PO TB24: 50.0000 mg | ORAL_TABLET | Freq: Two times a day (BID) | ORAL | 1 refills | Status: DC

## 2019-06-24 DIAGNOSIS — I129 Hypertensive chronic kidney disease with stage 1 through stage 4 chronic kidney disease, or unspecified chronic kidney disease: Secondary | ICD-10-CM | POA: Diagnosis not present

## 2019-06-24 DIAGNOSIS — E782 Mixed hyperlipidemia: Secondary | ICD-10-CM | POA: Diagnosis not present

## 2019-06-24 DIAGNOSIS — D638 Anemia in other chronic diseases classified elsewhere: Secondary | ICD-10-CM | POA: Diagnosis not present

## 2019-06-24 DIAGNOSIS — I2510 Atherosclerotic heart disease of native coronary artery without angina pectoris: Secondary | ICD-10-CM | POA: Diagnosis not present

## 2019-07-01 DIAGNOSIS — Z23 Encounter for immunization: Secondary | ICD-10-CM | POA: Diagnosis not present

## 2019-07-01 DIAGNOSIS — I2510 Atherosclerotic heart disease of native coronary artery without angina pectoris: Secondary | ICD-10-CM | POA: Diagnosis not present

## 2019-07-01 DIAGNOSIS — I129 Hypertensive chronic kidney disease with stage 1 through stage 4 chronic kidney disease, or unspecified chronic kidney disease: Secondary | ICD-10-CM | POA: Diagnosis not present

## 2019-07-05 ENCOUNTER — Other Ambulatory Visit: Payer: Self-pay | Admitting: Cardiology

## 2019-07-05 DIAGNOSIS — R600 Localized edema: Secondary | ICD-10-CM

## 2019-07-26 ENCOUNTER — Ambulatory Visit: Payer: Medicare Other | Admitting: Cardiology

## 2019-08-02 DIAGNOSIS — Z950 Presence of cardiac pacemaker: Secondary | ICD-10-CM | POA: Diagnosis not present

## 2019-08-02 DIAGNOSIS — I442 Atrioventricular block, complete: Secondary | ICD-10-CM | POA: Diagnosis not present

## 2019-08-02 DIAGNOSIS — Z45018 Encounter for adjustment and management of other part of cardiac pacemaker: Secondary | ICD-10-CM | POA: Diagnosis not present

## 2019-08-10 ENCOUNTER — Encounter: Payer: Self-pay | Admitting: Cardiology

## 2019-08-12 ENCOUNTER — Telehealth: Payer: Self-pay

## 2019-08-12 NOTE — Telephone Encounter (Signed)
Pt aware.

## 2019-08-12 NOTE — Telephone Encounter (Signed)
-----   Message from Adrian Prows, MD sent at 08/10/2019  5:42 AM EDT ----- Regarding: Pacemaker Scheduled Remote pacemaker check  08/02/2019: Normal functioon. No mode switches. Battery longevity is 8.5-9.3 years. RA pacing is 4.4 %, RV pacing is 99.9 %.  Normal function

## 2019-08-26 ENCOUNTER — Telehealth: Payer: Self-pay

## 2019-08-26 NOTE — Telephone Encounter (Signed)
08/02/19

## 2019-08-26 NOTE — Telephone Encounter (Signed)
Please check when was the last transmission we had from her.  Thanks MJP

## 2019-08-26 NOTE — Telephone Encounter (Signed)
Can you please contact medtronic?

## 2019-08-26 NOTE — Telephone Encounter (Signed)
Telephone encounter:  Reason for call: pt feels her medtronic transmitter is not working, Scientist, water quality will send another box out but will take a few days. Patient is worried its not sending her info, please advise.   Usual provider: MP  Last office visit: 04/23/19  Next office visit: 09/23/19   Last hospitalization: NA   Current Outpatient Medications on File Prior to Visit  Medication Sig Dispense Refill  . acetaminophen (TYLENOL) 325 MG tablet Take 325 mg by mouth every 4 (four) hours as needed.     . ALPRAZolam (XANAX) 0.25 MG tablet Take 1 tablet by mouth as needed.    Marland Kitchen aspirin EC 81 MG tablet Take 81 mg by mouth daily.    . B Complex-C-Calcium (QC B-COMPLEX/VITAMIN C PO) Take 1 tablet by mouth daily.    . Biotin 10000 MCG TABS Take 1 capsule by mouth daily.    . Calcium Carbonate-Vitamin D3 (CALCIUM 600-D) 600-400 MG-UNIT TABS Take 1 tablet by mouth daily.    . cetirizine (ZYRTEC) 10 MG tablet Take 10 mg by mouth as needed.   5  . clobetasol ointment (TEMOVATE) [id number] % Apply 1 application topically 2 (two) times daily as needed. For skin rash/irritation.  1  . Cyanocobalamin (VITAMIN B-12) 2500 MCG SUBL Place 2,500 mcg under the tongue daily.    . diclofenac sodium (VOLTAREN) 1 % GEL Apply 4 g topically 4 (four) times daily. To shoulder (Patient taking differently: Apply 4 g topically daily as needed (pain). To shoulder) 1 Tube 1  . docusate sodium (COLACE) 50 MG capsule Take 50 mg by mouth as needed for mild constipation.    . EUCRISA 2 % OINT Apply 1 application topically daily.    Marland Kitchen losartan (COZAAR) 50 MG tablet Take 50 mg by mouth daily.    . metoprolol succinate (TOPROL-XL) 50 MG 24 hr tablet Take 1 tablet (50 mg total) by mouth 2 (two) times daily. 180 tablet 1  . PATADAY 0.2 % SOLN Place 1 drop into both eyes daily as needed (for irritated eyes.).     Marland Kitchen Probiotic Product (PROBIOTIC PO) Take 1 capsule by mouth daily.     . RABEprazole (ACIPHEX) 20 MG tablet Take 20 mg by mouth  daily as needed (acid reflux).     . rosuvastatin (CRESTOR) 10 MG tablet Take 10 mg by mouth daily.    Marland Kitchen torsemide (DEMADEX) 20 MG tablet USE ONLY IF NEEDED FOR LEG EDEMA. DO NOT NEED TO TAKE IT EVERYDAY. 90 tablet 1   No current facility-administered medications on file prior to visit.

## 2019-08-26 NOTE — Telephone Encounter (Signed)
That's good then. She wouldn't be due for another for 1 more week. No issues even if its delayed by a bit. Please follow up with [employer] re: this.  Thanks MJP

## 2019-08-27 NOTE — Telephone Encounter (Signed)
If they are sending her a new one everything is fine no need to contact medtronic

## 2019-09-16 DIAGNOSIS — N183 Chronic kidney disease, stage 3 unspecified: Secondary | ICD-10-CM | POA: Diagnosis not present

## 2019-09-16 DIAGNOSIS — I129 Hypertensive chronic kidney disease with stage 1 through stage 4 chronic kidney disease, or unspecified chronic kidney disease: Secondary | ICD-10-CM | POA: Diagnosis not present

## 2019-09-16 DIAGNOSIS — I2510 Atherosclerotic heart disease of native coronary artery without angina pectoris: Secondary | ICD-10-CM | POA: Diagnosis not present

## 2019-09-16 DIAGNOSIS — E782 Mixed hyperlipidemia: Secondary | ICD-10-CM | POA: Diagnosis not present

## 2019-09-16 DIAGNOSIS — Z23 Encounter for immunization: Secondary | ICD-10-CM | POA: Diagnosis not present

## 2019-09-16 DIAGNOSIS — I10 Essential (primary) hypertension: Secondary | ICD-10-CM | POA: Diagnosis not present

## 2019-09-16 DIAGNOSIS — Z0000 Encounter for general adult medical examination without abnormal findings: Secondary | ICD-10-CM | POA: Diagnosis not present

## 2019-09-16 DIAGNOSIS — D500 Iron deficiency anemia secondary to blood loss (chronic): Secondary | ICD-10-CM | POA: Diagnosis not present

## 2019-09-17 ENCOUNTER — Ambulatory Visit: Payer: Medicare Other | Admitting: Cardiology

## 2019-09-17 NOTE — Progress Notes (Deleted)
Patient is here for follow up visit.  Subjective:   [name]  ID: Holly [name], female    DOB: 10/08/1941, 78 y.o.   MRN: [id number]  *** No chief complaint on file.   HPI  78 year old African American female with hypertension, dual-chamber pacemaker placement for AV block, moderate, medically managed coronary artery disease (LCx), hypertension, NSVT, here for follow up.  Patient recently had a remote transmission of 24 beat NSVT on 09/12/2019 at 03:18 hours for 09 Sec. Health trends do not demonstrate significant abnormality.  ***  Past Medical History:  Diagnosis Date  . 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 Bronchitis   . Encounter for care of pacemaker 05/12/2019  . Hypertension   . Pacemaker - MRI safe Medtronic Azure XT DR MRI [id number] Dual chamber PPM in situ 04/24/2019     Past Surgical History:  Procedure Laterality Date  . LEFT HEART CATH AND CORONARY ANGIOGRAPHY N/A 11/11/2017   Procedure: LEFT HEART CATH AND CORONARY ANGIOGRAPHY;  Surgeon: Nigel Mormon, MD;  Location: [place] [facility];  Service: Cardiovascular;  Laterality: N/A;  . left leg surgery    . PACEMAKER IMPLANT N/A 07/15/2017   Procedure: Pacemaker Implant;  Surgeon: Constance Haw, MD;  Location: [place] [facility];  Service: Cardiovascular;  Laterality: N/A;     Social History   Socioeconomic History  . Marital status: Divorced    Spouse name: Not on file  . Number of children: 7  . Years of education: Not on file  . Highest education level: Not on file  Occupational History  . Not on file  Social Needs  . Financial resource strain: Not on file  . Food insecurity    Worry: Not on file    Inability: Not on file  . Transportation needs    Medical: Not on file    Non-medical: Not on file  Tobacco Use  . Smoking status: Never Smoker  . Smokeless tobacco: Never Used  Substance and Sexual Activity  . Alcohol use: No  . Drug use: Not on file  . Sexual activity: Not on file   Lifestyle  . Physical activity    Days per week: Not on file    Minutes per session: Not on file  . Stress: Not on file  Relationships  . Social Herbalist on phone: Not on file    Gets together: Not on file    Attends religious service: Not on file    Active member of club or organization: Not on file    Attends meetings of clubs or organizations: Not on file    Relationship status: Not on file  . Intimate partner violence    Fear of current or ex partner: Not on file    Emotionally abused: Not on file    Physically abused: Not on file    Forced sexual activity: Not on file  Other Topics Concern  . Not on file  Social History Narrative  . Not on file     Current Outpatient Medications on File Prior to Visit  Medication Sig Dispense Refill  . acetaminophen (TYLENOL) 325 MG tablet Take 325 mg by mouth every 4 (four) hours as needed.     . ALPRAZolam (XANAX) 0.25 MG tablet Take 1 tablet by mouth as needed.    Marland Kitchen aspirin EC 81 MG tablet Take 81 mg by mouth daily.    . B Complex-C-Calcium (QC B-COMPLEX/VITAMIN C PO) Take 1  tablet by mouth daily.    . Biotin 10000 MCG TABS Take 1 capsule by mouth daily.    . Calcium Carbonate-Vitamin D3 (CALCIUM 600-D) 600-400 MG-UNIT TABS Take 1 tablet by mouth daily.    . cetirizine (ZYRTEC) 10 MG tablet Take 10 mg by mouth as needed.   5  . clobetasol ointment (TEMOVATE) 7.65 % Apply 1 application topically 2 (two) times daily as needed. For skin rash/irritation.  1  . Cyanocobalamin (VITAMIN B-12) 2500 MCG SUBL Place 2,500 mcg under the tongue daily.    . diclofenac sodium (VOLTAREN) 1 % GEL Apply 4 g topically 4 (four) times daily. To shoulder (Patient taking differently: Apply 4 g topically daily as needed (pain). To shoulder) 1 Tube 1  . docusate sodium (COLACE) 50 MG capsule Take 50 mg by mouth as needed for mild constipation.    . EUCRISA 2 % OINT Apply 1 application topically daily.    Marland Kitchen losartan (COZAAR) 50 MG tablet Take 50 mg  by mouth daily.    . metoprolol succinate (TOPROL-XL) 50 MG 24 hr tablet Take 1 tablet (50 mg total) by mouth 2 (two) times daily. 180 tablet 1  . PATADAY 0.2 % SOLN Place 1 drop into both eyes daily as needed (for irritated eyes.).     Marland Kitchen Probiotic Product (PROBIOTIC PO) Take 1 capsule by mouth daily.     . RABEprazole (ACIPHEX) 20 MG tablet Take 20 mg by mouth daily as needed (acid reflux).     . rosuvastatin (CRESTOR) 10 MG tablet Take 10 mg by mouth daily.    Marland Kitchen torsemide (DEMADEX) 20 MG tablet USE ONLY IF NEEDED FOR LEG EDEMA. DO NOT NEED TO TAKE IT EVERYDAY. 90 tablet 1   No current facility-administered medications on file prior to visit.     Cardiovascular studies:  Scheduled Remote pacemaker check  08/02/2019: Normal functioon. No mode switches. Battery longevity is 8.5-9.3 years. RA pacing is 4.4 %, RV pacing is 99.9 %.  Unscheduled (Alert) 09/12/2019: There was 1 high ventricular rate episode detected. EGM shows 24 bts of NSVT at 03:18 hours for 09 Sec. Health trends do not demonstrate significant abnormality.  EKG 01/22/2019: Sinus rhythm with ventricular pacing.   Echocardiogram 08/06/2018: 1. Left ventricle cavity is normal in size. 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 Borderline normal global wall motion. Visual EF is 50-55%. Doppler evidence of grade I (impaired) diastolic dysfunction. Calculated EF 51%. 2. Left atrial cavity is moderately dilated. 3. Mild (Grade I) aortic regurgitation. 4. Moderate (Grade II) mitral regurgitation. 5. Moderate tricuspid regurgitation. No evidence of pulmonary hypertension. 6. Echoes of pacemaker lead are visible in right heart.  Lower extremity venous duplex 08/06/2018: No evidence of deep vein thrombosis of the lower extremities with normal venous return.  Lexiscan myoview stress test 08/04/2017: 1. Non-diagnostic due to paced rhythm. 2. Study quality: good. The left ventricular chamber dimensions are normal. There  is medium scar of the inferolateral segment with moderate to severe residual ischemia. There is small scar of the inferior segment with very mild residual ischemia. 3. Systolic function is mildly reduced. The calculated stress EF is at 44 %. Gated SPECT imaging demonstrates hypokinesis in the inferior, lateral segment(s). 5. Intermediate risk study. Clinical correlation recommended.  Left Heart Cath and Coronary Angiogram [11/11/2017]: Moderate coronary artery disease. She does have abnormal stress test in inferolateral territory, they are out of proportion to the coronary artery findings. Furthermore, she does not have any critical unstable  lesion, to explain nonsustained ventricular tachycardia. She does not have any ongoing chest pain symptoms at this time. Recommend aggressive medical management at this time. If true anginal symptoms occur, could then perform PCI to mid circumflex.  Pacemaker Implant 07/16/2017: Medtronic Azure XT DR MRI Sure Scan (serial number [id number]) dual chamber pacemaker 07/16/2017 by [facility] for complete heart block.  Recent labs: 11/06/2017: BUN/Cr 14/0.95. eGFR 67 Na 139, K 4.5 H/H 10.9/33.6. MCV 87. Platelets 193 INR 1.0  09/04/2017: Glucose 111, BUNs/creatinine 11/1.14. EGFR 53. Cholesterol 133, HDL 64, LDL 58, triglyceride 57.   Lab Results  Component Value Date   TSH 1.987 07/15/2017    Review of Systems  Constitution: Negative for decreased appetite, malaise/fatigue, weight gain and weight loss.  HENT: Negative for congestion.   Eyes: Negative for visual disturbance.  Cardiovascular: Negative for chest pain, dyspnea on exertion, leg swelling, palpitations and syncope.  Respiratory: Negative for shortness of breath.   Endocrine: Negative for cold intolerance.  Hematologic/Lymphatic: Does not bruise/bleed easily.  Skin: Negative for itching and rash.  Musculoskeletal: Positive for arthritis and stiffness (joints). Negative for myalgias.         Decreased Range of Motion (Right hip).  Gastrointestinal: Negative for abdominal pain, nausea and vomiting.  Genitourinary: Negative for dysuria.  Neurological: Negative for dizziness and weakness.  Psychiatric/Behavioral: The patient is not nervous/anxious.   All other systems reviewed and are negative.      Objective:   *** There were no vitals filed for this visit.   Physical Exam  Constitutional: She is oriented to person, place, and time. She appears well-developed and well-nourished. No distress.  HENT:  Head: Normocephalic and atraumatic.  Eyes: Pupils are equal, round, and reactive to light. Conjunctivae are normal.  Neck: No JVD present.  Cardiovascular: Normal rate, regular rhythm and intact distal pulses.  Pulmonary/Chest: Effort normal and breath sounds normal. She has no wheezes. She has no rales.  Abdominal: Soft. Bowel sounds are normal. There is no rebound.  Musculoskeletal:        General: No edema.  Lymphadenopathy:    She has no cervical adenopathy.  Neurological: She is alert and oriented to person, place, and time. No cranial nerve deficit.  Skin: Skin is warm and dry.  Psychiatric: She has a normal mood and affect.  Nursing note and vitals reviewed.      Assessment & Recommendations:   78 year old [place] American female with hypertension, dual-chamber pacemaker placement for AV block, moderate, medically managed coronary artery disease (LCx), hypertension, NSVT, here for follow up.  NSVT: 24 beat NSVT at 3:08 AM. ***  Coronary artery disease: Stable. No angina symptoms.  Continue Aspirin, statin   Hypertension: Controlled.  Dual chamber pacemaker: Functioning well. Longevity >10 years. No alerts to suggest pacemaker malfunction. Will continue remote monitoring.  Office f/u in 6-9 months with in person pacemaker check.    Nigel Mormon, MD [facility] [place] Pager: [phone] Office: [phone] If no answer Cell  [phone]

## 2019-09-23 ENCOUNTER — Encounter: Payer: Self-pay | Admitting: Cardiology

## 2019-09-23 ENCOUNTER — Other Ambulatory Visit: Payer: Self-pay

## 2019-09-23 ENCOUNTER — Ambulatory Visit (INDEPENDENT_AMBULATORY_CARE_PROVIDER_SITE_OTHER): Payer: Medicare Other | Admitting: Cardiology

## 2019-09-23 ENCOUNTER — Ambulatory Visit: Payer: Medicare Other | Admitting: Cardiology

## 2019-09-23 VITALS — BP 150/83 | HR 79 | Temp 98.0°F | Ht 59.0 in | Wt 132.0 lb

## 2019-09-23 DIAGNOSIS — I472 Ventricular tachycardia: Secondary | ICD-10-CM | POA: Diagnosis not present

## 2019-09-23 DIAGNOSIS — Z950 Presence of cardiac pacemaker: Secondary | ICD-10-CM

## 2019-09-23 DIAGNOSIS — I4729 Other ventricular tachycardia: Secondary | ICD-10-CM | POA: Insufficient documentation

## 2019-09-23 DIAGNOSIS — I10 Essential (primary) hypertension: Secondary | ICD-10-CM | POA: Diagnosis not present

## 2019-09-23 DIAGNOSIS — I25118 Atherosclerotic heart disease of native coronary artery with other forms of angina pectoris: Secondary | ICD-10-CM | POA: Diagnosis not present

## 2019-09-23 DIAGNOSIS — Z45018 Encounter for adjustment and management of other part of cardiac pacemaker: Secondary | ICD-10-CM | POA: Diagnosis not present

## 2019-09-23 MED ORDER — DILTIAZEM HCL ER COATED BEADS 120 MG PO CP24: 120.0000 mg | ORAL_CAPSULE | Freq: Every day | ORAL | 3 refills | Status: DC

## 2019-09-23 MED ORDER — NITROGLYCERIN 0.4 MG SL SUBL: 0.4000 mg | SUBLINGUAL_TABLET | SUBLINGUAL | 3 refills | Status: AC | PRN

## 2019-09-23 NOTE — Patient Instructions (Signed)
Dietary recommendation The 2019 ACC/AHA guidelines promote nutrition as a main fixture of cardiovascular wellness, with a recommendation for a varied diet of fruit, vegetables, fish, legumes, and whole grains (Class I), as well as recommendations to reduce sodium, cholesterol, processed meats, and refined sugars (Class IIa recommendation).10 Sodium intake, a topic of some controversy as of late, is recommended to be kept at 1,500 mg/day or less, far below the average daily intake in the [place] of 3,409 mg/day, and notably below that of previous [place] recommendations for <2,300mg/day.10,11 For those unable to reach 1,500 mg/day, they recommend at least a reduction of 1000 mg/day.  A Pes[place]-Mediterranean Diet With Intermittent Fasting: JACC Review Topic of the Week. J Am Coll Cardiol 2020;76:1484-1493 Pes[place]-Mediterranean diet, it is supplemented with extra-virgin olive oil (EVOO), which is the principle fat source, along with moderate amounts of dairy (particularly yogurt and cheese) and eggs, as well as modest amounts of alcohol consumption (ideally red wine with the evening meal), but few red and processed meats.  

## 2019-09-23 NOTE — Progress Notes (Signed)
Patient is here for follow up visit.  Subjective:   [name]  ID: Holly [name], female    DOB: Mar 10, 1941, 78 y.o.   MRN: [id number]   Chief Complaint  Patient presents with   Coronary Artery Disease   Hypertension   Follow-up    5 month, pacer ck    HPI  78 year old African American female with hypertension, dual-chamber pacemaker placement for AV block, moderate, medically managed coronary artery disease (LCx), hypertension, NSVT, here for follow up.  Patient recently had a remote transmission of 24 beat NSVT on 09/12/2019 at 03:18 hours for 09 Sec. Health trends do not demonstrate significant abnormality.  Unspecified patient, patient endorses chest pain when she walks with a cane in church.  She does not have any chest pain symptoms blood walking with a walker inside her house.  She has not had any syncopal episodes.  Past Medical History:  Diagnosis Date   AV block, 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[place]) 07/15/2017   Bronchitis    Encounter for care of pacemaker 05/12/2019   Hypertension    Pacemaker - MRI safe Medtronic Azure XT DR MRI [id number] Dual chamber PPM in situ 04/24/2019     Past Surgical History:  Procedure Laterality Date   LEFT HEART CATH AND CORONARY ANGIOGRAPHY N/A 11/11/2017   Procedure: LEFT HEART CATH AND CORONARY ANGIOGRAPHY;  Surgeon: Nigel Mormon, MD;  Location: [facility];  Service: Cardiovascular;  Laterality: N/A;   left leg surgery     PACEMAKER IMPLANT N/A 07/15/2017   Procedure: Pacemaker Implant;  Surgeon: Constance Haw, MD;  Location: [facility];  Service: Cardiovascular;  Laterality: N/A;     Social History   Socioeconomic History   Marital status: Divorced    Spouse name: Not on file   Number of children: 7   Years of education: Not on file   Highest education level: Not on file  Occupational History   Not on file  Social Needs   Financial resource strain: Not on file   Food insecurity    Worry: Not on  file    Inability: Not on file   Transportation needs    Medical: Not on file    Non-medical: Not on file  Tobacco Use   Smoking status: Never Smoker   Smokeless tobacco: Never Used  Substance and Sexual Activity   Alcohol use: No   Drug use: Not on file   Sexual activity: Not on file  Lifestyle   Physical activity    Days per week: Not on file    Minutes per session: Not on file   Stress: Not on file  Relationships   Social connections    Talks on phone: Not on file    Gets together: Not on file    Attends religious service: Not on file    Active member of club or organization: Not on file    Attends meetings of clubs or organizations: Not on file    Relationship status: Not on file   Intimate partner violence    Fear of current or ex partner: Not on file    Emotionally abused: Not on file    Physically abused: Not on file    Forced sexual activity: Not on file  Other Topics Concern   Not on file  Social History Narrative   Not on file     Current Outpatient Medications on File Prior to Visit  Medication Sig Dispense Refill   acetaminophen (TYLENOL)  325 MG tablet Take 325 mg by mouth every 4 (four) hours as needed.      ALPRAZolam (XANAX) 0.25 MG tablet Take 1 tablet by mouth as needed.     aspirin EC 81 MG tablet Take 81 mg by mouth daily.     B Complex-C-Calcium (QC B-COMPLEX/VITAMIN C PO) Take 1 tablet by mouth daily.     Biotin 10000 MCG TABS Take 1 capsule by mouth daily.     cetirizine (ZYRTEC) 10 MG tablet Take 10 mg by mouth as needed.   5   clobetasol ointment (TEMOVATE) 7.98 % Apply 1 application topically 2 (two) times daily as needed. For skin rash/irritation.  1   Cyanocobalamin (VITAMIN B-12) 2500 MCG SUBL Place 2,500 mcg under the tongue daily.     diclofenac sodium (VOLTAREN) 1 % GEL Apply 4 g topically 4 (four) times daily. To shoulder (Patient taking differently: Apply 4 g topically daily as needed (pain). To shoulder) 1 Tube 1     docusate sodium (COLACE) 50 MG capsule Take 50 mg by mouth as needed for mild constipation.     EUCRISA 2 % OINT Apply 1 application topically daily.     losartan (COZAAR) 50 MG tablet Take 50 mg by mouth daily.     metoprolol succinate (TOPROL-XL) 50 MG 24 hr tablet Take 1 tablet (50 mg total) by mouth 2 (two) times daily. 180 tablet 1   PATADAY 0.2 % SOLN Place 1 drop into both eyes daily as needed (for irritated eyes.).      Probiotic Product (PROBIOTIC PO) Take 1 capsule by mouth daily.      RABEprazole (ACIPHEX) 20 MG tablet Take 20 mg by mouth daily as needed (acid reflux).      rosuvastatin (CRESTOR) 10 MG tablet Take 10 mg by mouth daily.     torsemide (DEMADEX) 20 MG tablet USE ONLY IF NEEDED FOR LEG EDEMA. DO NOT NEED TO TAKE IT EVERYDAY. 90 tablet 1   No current facility-administered medications on file prior to visit.     Cardiovascular studies:  Scheduled Remote pacemaker check  08/02/2019: Normal functioon. No mode switches. Battery longevity is 8.5-9.3 years. RA pacing is 4.4 %, RV pacing is 99.9 %.  Unscheduled (Alert) 09/12/2019: There was 1 high ventricular rate episode detected. EGM shows 24 bts of NSVT at 03:18 hours for 09 Sec. Health trends do not demonstrate significant abnormality.  EKG 01/22/2019: Sinus rhythm with ventricular pacing.   Echocardiogram 08/06/2018: 1. Left ventricle cavity is normal in size. 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 Borderline normal global wall motion. Visual EF is 50-55%. Doppler evidence of grade I (impaired) diastolic dysfunction. Calculated EF 51%. 2. Left atrial cavity is moderately dilated. 3. Mild (Grade I) aortic regurgitation. 4. Moderate (Grade II) mitral regurgitation. 5. Moderate tricuspid regurgitation. No evidence of pulmonary hypertension. 6. Echoes of pacemaker lead are visible in right heart.  Lower extremity venous duplex 08/06/2018: No evidence of deep vein thrombosis of the lower  extremities with normal venous return.  Lexiscan myoview stress test 08/04/2017: 1. Non-diagnostic due to paced rhythm. 2. Study quality: good. The left ventricular chamber dimensions are normal. There is medium scar of the inferolateral segment with moderate to severe residual ischemia. There is small scar of the inferior segment with very mild residual ischemia. 3. Systolic function is mildly reduced. The calculated stress EF is at 44 %. Gated SPECT imaging demonstrates hypokinesis in the inferior, lateral segment(s). 5. Intermediate risk study. Clinical  correlation recommended.  Left Heart Cath and Coronary Angiogram [11/11/2017]: Moderate coronary artery disease. She does have abnormal stress test in inferolateral territory, they are out of proportion to the coronary artery findings. Furthermore, she does not have any critical unstable lesion, to explain nonsustained ventricular tachycardia. She does not have any ongoing chest pain symptoms at this time. Recommend aggressive medical management at this time. If true anginal symptoms occur, could then perform PCI to mid circumflex.  Pacemaker Implant 07/16/2017: Medtronic Azure XT DR MRI Sure Scan (serial number [id number]) dual chamber pacemaker 07/16/2017 by [facility] for complete heart block.  Recent labs: 11/06/2017: BUN/Cr 14/0.95. eGFR 67 Na 139, K 4.5 H/H 10.9/33.6. MCV 87. Platelets 193 INR 1.0  09/04/2017: Glucose 111, BUNs/creatinine 11/1.14. EGFR 53. Cholesterol 133, HDL 64, LDL 58, triglyceride 57.   Lab Results  Component Value Date   TSH 1.987 07/15/2017    Review of Systems  Constitution: Negative for decreased appetite, malaise/fatigue, weight gain and weight loss.  HENT: Negative for congestion.   Eyes: Negative for visual disturbance.  Cardiovascular: Negative for chest pain, dyspnea on exertion, leg swelling, palpitations and syncope.  Respiratory: Negative for shortness of breath.   Endocrine: Negative for cold  intolerance.  Hematologic/Lymphatic: Does not bruise/bleed easily.  Skin: Negative for itching and rash.  Musculoskeletal: Positive for arthritis and stiffness (joints). Negative for myalgias.        Decreased Range of Motion (Right hip).  Gastrointestinal: Negative for abdominal pain, nausea and vomiting.  Genitourinary: Negative for dysuria.  Neurological: Negative for dizziness and weakness.  Psychiatric/Behavioral: The patient is not nervous/anxious.   All other systems reviewed and are negative.      Objective:    Vitals:   09/23/19 1000 09/23/19 1011  BP: (!) 161/82 (!) 150/83  Pulse: 85 79  Temp: 98 F (36.7 C) 98 F (36.7 C)  SpO2: 99%      Physical Exam  Constitutional: She is oriented to person, place, and time. She appears well-developed and well-nourished. No distress.  HENT:  Head: Normocephalic and atraumatic.  Eyes: Pupils are equal, round, and reactive to light. Conjunctivae are normal.  Neck: No JVD present.  Cardiovascular: Normal rate, regular rhythm and intact distal pulses.  Pulmonary/Chest: Effort normal and breath sounds normal. She has no wheezes. She has no rales.  Abdominal: Soft. Bowel sounds are normal. There is no rebound.  Musculoskeletal:        General: No edema.  Lymphadenopathy:    She has no cervical adenopathy.  Neurological: She is alert and oriented to person, place, and time. No cranial nerve deficit.  Skin: Skin is warm and dry.  Psychiatric: She has a normal mood and affect.  Nursing note and vitals reviewed.      Assessment & Recommendations:   78 year old [place] American female with hypertension, dual-chamber pacemaker placement for AV block, moderate, medically managed coronary artery disease (LCx), hypertension, NSVT, here for follow up.  NSVT/CAD: 24 beat NSVT at 3:08 AM. Differential for her nonsustained VT episode remains ischemia.  Patient has had these episodes for close to 2 years, without any syncope or cardiac  arrest.  Coronary angiogram in 11/2017 showed moderate focal left circumflex stenosis, which was treated medically in absence of any angina symptoms.  I again discussed management options with patient and her daughter.  I do suspect that she is having class II anginal symptoms which could potentially explain her nonsustained VT episodes.  Nonetheless, we will have to wait and see  if treatment of ischemia, medically or with stenting, completely resolve her NSVT or not.  At this time, patient would like to continue medical management.  I have added diltiazem 120 mg daily, in addition to metoprolol succinate 50 mg daily.  Continue aspirin and statin.  I will see the patient back in 4 weeks.  If she continues to have recurrent NSVT episodes or worsening anginal symptoms, I will then proceed with coronary angiography and stenting.  Hypertension: Suboptimal control.  Added diltiazem as well.  Encounter for care of pacemaker: Scheduled  In office pacemaker check 09/23/19  Single (S)/Dual (D)/BV: D. Presenting AS/VP. Pacemaker dependant:  Yes. Underlying CHB. AP 12.3%, VP 99.7%.  AT/AF burden <0.1% 1 atrial tachycardia episode Longest 00:36 sec. Latest 154 bpm. HVR: NSVT. Longest 00:09. Latest 182 bpm. Longevity 8.7 Years. Lead measurements: Stable. Histogram: Low (L)/normal (N)/high (H)  N. Patient activity <2 hr/day.   Observations: NSVT Changes: None.  Dual chamber pacemaker: Functioning well. Longevity >10 years. No alerts to suggest pacemaker malfunction. Will continue remote monitoring.  F/u in 4 weeks   Sandoval, MD [facility] [place] Pager: [phone] Office: [phone] If no answer Cell [phone]

## 2019-09-28 DIAGNOSIS — Z0000 Encounter for general adult medical examination without abnormal findings: Secondary | ICD-10-CM | POA: Diagnosis not present

## 2019-09-28 DIAGNOSIS — I472 Ventricular tachycardia: Secondary | ICD-10-CM | POA: Diagnosis not present

## 2019-09-28 DIAGNOSIS — I2510 Atherosclerotic heart disease of native coronary artery without angina pectoris: Secondary | ICD-10-CM | POA: Diagnosis not present

## 2019-10-13 ENCOUNTER — Other Ambulatory Visit: Payer: Self-pay

## 2019-10-13 ENCOUNTER — Ambulatory Visit: Payer: Self-pay

## 2019-10-13 ENCOUNTER — Ambulatory Visit (INDEPENDENT_AMBULATORY_CARE_PROVIDER_SITE_OTHER): Payer: Medicare Other | Admitting: Orthopedic Surgery

## 2019-10-13 DIAGNOSIS — M19012 Primary osteoarthritis, left shoulder: Secondary | ICD-10-CM | POA: Diagnosis not present

## 2019-10-13 DIAGNOSIS — M25512 Pain in left shoulder: Secondary | ICD-10-CM

## 2019-10-13 DIAGNOSIS — M5410 Radiculopathy, site unspecified: Secondary | ICD-10-CM | POA: Diagnosis not present

## 2019-10-16 ENCOUNTER — Encounter: Payer: Self-pay | Admitting: Orthopedic Surgery

## 2019-10-16 DIAGNOSIS — M19012 Primary osteoarthritis, left shoulder: Secondary | ICD-10-CM | POA: Diagnosis not present

## 2019-10-16 MED ORDER — METHYLPREDNISOLONE ACETATE 40 MG/ML IJ SUSP
40.0000 mg | INTRAMUSCULAR | Status: AC | PRN
  Administered 2019-10-16: 09:00:00 40 mg via INTRA_ARTICULAR

## 2019-10-16 MED ORDER — LIDOCAINE HCL 1 % IJ SOLN
5.0000 mL | INTRAMUSCULAR | Status: AC | PRN
  Administered 2019-10-16: 09:00:00 5 mL

## 2019-10-16 MED ORDER — BUPIVACAINE HCL 0.5 % IJ SOLN
9.0000 mL | INTRAMUSCULAR | Status: AC | PRN
  Administered 2019-10-16: 09:00:00 9 mL via INTRA_ARTICULAR

## 2019-10-16 NOTE — Progress Notes (Signed)
Office Visit Note   Patient: Holly [name]           Date of Birth: [birth year]           MRN: [id number] Visit Date: 10/13/2019 Requested by: Holly [name], [address],  [place] [id number] PCP: Holly Seashore, MD  Subjective: Chief Complaint  Patient presents with  . Left Shoulder - Pain  . Right Hip - Pain    HPI: Holly [name] is a patient with left shoulder pain and right hip pain.  Regarding the left shoulder she is right-hand dominant but reports painful range of motion in the left shoulder.  She does have a pacemaker placed on that side.  She describes mild worsening of symptoms compared to 2019.  She reports grinding and pain which does wake her from sleep.  Patient also reports right hip pain.  Interestingly she does not report much pain or symptoms on the left-hand side.  She does get some pain which radiates to the toes at times but denies any numbness and tingling.  She is unable to stand for a long period of time.  Shoulder symptoms are worse than the hip.  She does not really want to proceed with any type of operative intervention for either of these problems and she wants to manage them both nonoperatively if possible.              ROS: All systems reviewed are negative as they relate to the chief complaint within the history of present illness.  Patient denies  fevers or chills.   Assessment & Plan: Visit Diagnoses:  1. Left shoulder pain, unspecified chronicity   2. 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  Plan is injection into the glenohumeral joint today.  Regarding her hip pain I think she has pretty significant hip arthritis as well as potentially her back contributing to some of this pain.  I would favor diagnostic and therapeutic hip injection under ultrasound guidance with Dr. Junius [name].  She does not want to proceed with any type of  hip replacement at this time.  Follow-up with me as needed.  Follow-Up Instructions: No follow-ups on file.   Orders:  Orders Placed This Encounter  Procedures  . XR Shoulder Left  . XR Lumbar Spine 2-3 Views   No orders of the defined types were placed in this encounter.     Procedures: Large Joint Inj: L glenohumeral on 10/16/2019 9:09 AM Indications: diagnostic evaluation and pain Details: 18 G 1.5 in needle, posterior approach  Arthrogram: No  Medications: 9 mL bupivacaine 0.5 %; 40 mg methylPREDNISolone acetate 40 MG/ML; 5 mL lidocaine 1 % Outcome: tolerated well, no immediate complications Procedure, treatment alternatives, risks and benefits explained, specific risks discussed. Consent was given by the patient. Immediately prior to procedure a time out was called to verify the correct patient, procedure, equipment, support staff and site/side marked as required. Patient was prepped and draped in the usual sterile fashion.       Clinical Data: No additional findings.  Objective: Vital Signs: There were no vitals taken for this visit.  Physical Exam:   Constitutional: Patient appears well-developed HEENT:  Head: Normocephalic Eyes:EOM are normal Neck: Normal range of motion Cardiovascular: Normal rate Pulmonary/chest: Effort normal Neurologic: Patient is alert Skin: Skin is warm Psychiatric: Patient has normal mood and affect    Ortho  Exam: Ortho exam demonstrates full active and passive range of motion of the cervical spine.  She does walk with a walker.  She has predictably limited right and left hip range of motion but the right hip range of motion is painful.  Left hip surprisingly minimally painful.  Hip flexion strength is about 4+ out of 5 bilaterally.  No nerve root tension signs.  Pedal pulses and radial pulses palpable.  She has got coarse grinding and crepitus with active and passive range of motion of that shoulder and also diminished range of motion  in terms of forward flexion and abduction.  No warmth to the left shoulder region no lymphadenopathy or masses in that region.  Specialty Comments:  No specialty comments available.  Imaging: No results found.   PMFS History: Patient Active Problem List   Diagnosis Date Noted  . 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  . NSVT (nonsustained ventricular tachycardia) ([place]) 09/23/2019  . Encounter for care of pacemaker 05/12/2019  . Coronary artery disease involving native coronary artery of native heart without angina pectoris 04/24/2019  . Essential hypertension 04/24/2019  . Abnormal stress test 11/08/2017  . 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 Pacemaker - MRI safe Medtronic Azure XT DR MRI [id number] Dual chamber PPM in situ 07/15/2017  . 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 Carpal tunnel syndrome 08/09/2013   Past Medical History:  Diagnosis Date  . 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 Bronchitis   . Encounter for care of pacemaker 05/12/2019  . Hypertension   . Pacemaker - MRI safe Medtronic Azure XT DR MRI [id number] Dual chamber PPM in situ 04/24/2019    Family History  Problem Relation Age of Onset  . Hypertension Mother   . ALS Sister   . Heart attack Brother   . Diabetes Daughter     Past Surgical History:  Procedure Laterality Date  . LEFT HEART CATH AND CORONARY ANGIOGRAPHY N/A 11/11/2017   Procedure: LEFT HEART CATH AND CORONARY ANGIOGRAPHY;  Surgeon: Nigel Mormon, MD;  Location: [facility];  Service: Cardiovascular;  Laterality: N/A;  . left leg surgery    . PACEMAKER IMPLANT N/A 07/15/2017   Procedure: Pacemaker Implant;  Surgeon: Constance Haw, MD;  Location: [facility];  Service: Cardiovascular;  Laterality: N/A;   Social History   Occupational History  . Not on file  Tobacco Use  . Smoking status: Never Smoker  . Smokeless tobacco: Never Used    Substance and Sexual Activity  . Alcohol use: No  . Drug use: Not on file  . Sexual activity: Not on file

## 2019-10-18 ENCOUNTER — Ambulatory Visit: Payer: Self-pay

## 2019-10-18 ENCOUNTER — Ambulatory Visit (INDEPENDENT_AMBULATORY_CARE_PROVIDER_SITE_OTHER): Payer: Medicare Other | Admitting: Family Medicine

## 2019-10-18 ENCOUNTER — Encounter: Payer: Self-pay | Admitting: Family Medicine

## 2019-10-18 ENCOUNTER — Other Ambulatory Visit: Payer: Self-pay

## 2019-10-18 DIAGNOSIS — M25551 Pain in right hip: Secondary | ICD-10-CM | POA: Diagnosis not present

## 2019-10-18 NOTE — Progress Notes (Signed)
Subjective: Patient is here for ultrasound-guided intra-articular right hip injection.   She has end-stage DJD.  Objective:  Very limited ROM with groin pain.  Procedure: Ultrasound-guided right hip injection: After sterile prep with Betadine, injected 8 cc 1% lidocaine without epinephrine and 40 mg methylprednisolone using a 22-gauge spinal needle, passing the needle through the iliofemoral ligament into the femoral head/neck junction.  Injectate seen filling joint capsule.  Very good immediate relief.

## 2019-10-29 NOTE — Progress Notes (Signed)
Patient is here for follow up visit.  Subjective:   [name]  ID: Holly [name], female    DOB: [birth year], 78 y.o.   MRN: [id number]   Chief Complaint  Patient presents with  . NSVT  . Chest Pain  . Follow-up    4wk    HPI  78 year old African American female with hypertension, dual-chamber pacemaker placement for AV block, moderate, medically managed coronary artery disease (LCx), hypertension, NSVT, here for follow up.  Patient recently had a remote transmission of 24 beat NSVT on 09/12/2019 at 03:18 hours for 09 Sec. Health trends do not demonstrate significant abnormality. There have been no recurrent NSVT episodes since then. Patient has had one episode of chest pain after emotional stress, resolved on its own in a few min. She has not had to take nitroglycerin. She also complains of neck pain, on both sides, unrelated to exertion.   Past Medical History:  Diagnosis Date  . 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 Bronchitis   . Encounter for care of pacemaker 05/12/2019  . Hypertension   . Pacemaker - MRI safe Medtronic Azure XT DR MRI [id number] Dual chamber PPM in situ 04/24/2019     Past Surgical History:  Procedure Laterality Date  . LEFT HEART CATH AND CORONARY ANGIOGRAPHY N/A 11/11/2017   Procedure: LEFT HEART CATH AND CORONARY ANGIOGRAPHY;  Surgeon: Nigel Mormon, MD;  Location: [place] [facility];  Service: Cardiovascular;  Laterality: N/A;  . left leg surgery    . PACEMAKER IMPLANT N/A 07/15/2017   Procedure: Pacemaker Implant;  Surgeon: Constance Haw, MD;  Location: [facility] [facility];  Service: Cardiovascular;  Laterality: N/A;     Social History   Socioeconomic History  . Marital status: Divorced    Spouse name: Not on file  . Number of children: 7  . Years of education: Not on file  . Highest education level: Not on file  Occupational History  . Not on file  Tobacco Use  . Smoking status: Never Smoker  . Smokeless tobacco: Never Used    Substance and Sexual Activity  . Alcohol use: No  . Drug use: Not on file  . Sexual activity: Not on file  Other Topics Concern  . Not on file  Social History Narrative  . Not on file   Social Determinants of Health   Financial Resource Strain:   . Difficulty of Paying Living Expenses: Not on file  Food Insecurity:   . Worried About Charity fundraiser in the Last Year: Not on file  . Ran Out of Food in the Last Year: Not on file  Transportation Needs:   . Lack of Transportation (Medical): Not on file  . Lack of Transportation (Non-Medical): Not on file  Physical Activity:   . Days of Exercise per Week: Not on file  . Minutes of Exercise per Session: Not on file  Stress:   . Feeling of Stress : Not on file  Social Connections:   . Frequency of Communication with Friends and Family: Not on file  . Frequency of Social Gatherings with Friends and Family: Not on file  . Attends Religious Services: Not on file  . Active Member of Clubs or Organizations: Not on file  . Attends Archivist Meetings: Not on file  . Marital Status: Not on file  Intimate Partner Violence:   . Fear of Current or Ex-Partner: Not on file  . Emotionally Abused: Not on file  .  Physically Abused: Not on file  . Sexually Abused: Not on file     Current Outpatient Medications on File Prior to Visit  Medication Sig Dispense Refill  . acetaminophen (TYLENOL) 325 MG tablet Take 325 mg by mouth every 4 (four) hours as needed.     . ALPRAZolam (XANAX) 0.25 MG tablet Take 1 tablet by mouth as needed.    Marland Kitchen aspirin EC 81 MG tablet Take 81 mg by mouth daily.    . B Complex-C-Calcium (QC B-COMPLEX/VITAMIN C PO) Take 1 tablet by mouth daily.    . Biotin 10000 MCG TABS Take 1 capsule by mouth daily.    . cetirizine (ZYRTEC) 10 MG tablet Take 10 mg by mouth as needed.   5  . clobetasol ointment (TEMOVATE) 5.68 % Apply 1 application topically 2 (two) times daily as needed. For skin rash/irritation.  1  .  Cyanocobalamin (VITAMIN B-12) 2500 MCG SUBL Place 2,500 mcg under the tongue daily.    . diclofenac sodium (VOLTAREN) 1 % GEL Apply 4 g topically 4 (four) times daily. To shoulder (Patient taking differently: Apply 4 g topically daily as needed (pain). To shoulder) 1 Tube 1  . diltiazem (CARDIZEM CD) 120 MG 24 hr capsule Take 1 capsule (120 mg total) by mouth daily. 90 capsule 3  . docusate sodium (COLACE) 50 MG capsule Take 50 mg by mouth as needed for mild constipation.    . EUCRISA 2 % OINT Apply 1 application topically daily.    Marland Kitchen losartan (COZAAR) 50 MG tablet Take 50 mg by mouth daily.    . metoprolol succinate (TOPROL-XL) 50 MG 24 hr tablet Take 1 tablet (50 mg total) by mouth 2 (two) times daily. (Patient taking differently: Take 50 mg by mouth daily. ) 180 tablet 1  . nitroGLYCERIN (NITROSTAT) 0.4 MG SL tablet Place 1 tablet (0.4 mg total) under the tongue every 5 (five) minutes as needed for chest pain. 90 tablet 3  . PATADAY 0.2 % SOLN Place 1 drop into both eyes daily as needed (for irritated eyes.).     Marland Kitchen Probiotic Product (PROBIOTIC PO) Take 1 capsule by mouth daily.     . RABEprazole (ACIPHEX) 20 MG tablet Take 20 mg by mouth daily as needed (acid reflux).     . rosuvastatin (CRESTOR) 10 MG tablet Take 10 mg by mouth daily.    Marland Kitchen torsemide (DEMADEX) 20 MG tablet USE ONLY IF NEEDED FOR LEG EDEMA. DO NOT NEED TO TAKE IT EVERYDAY. 90 tablet 1  . triamcinolone cream (KENALOG) 0.1 % 1 APPLICATION TWICE A DAY AS NEEDED EXTERNALLY 90 DAYS     No current facility-administered medications on file prior to visit.    Cardiovascular studies:  EKG 11/01/2019: SInus rhythm with ventricular pacing  Scheduled Remote pacemaker check  08/02/2019: Normal functioon. No mode switches. Battery longevity is 8.5-9.3 years. RA pacing is 4.4 %, RV pacing is 99.9 %.  Unscheduled (Alert) 09/12/2019: There was 1 high ventricular rate episode detected. EGM shows 24 bts of NSVT at 03:18 hours for 09 Sec.  Health trends do not demonstrate significant abnormality.  EKG 01/22/2019: Sinus rhythm with ventricular pacing.   Echocardiogram 08/06/2018: 1. Left ventricle cavity is normal in size. 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 Borderline normal global wall motion. Visual EF is 50-55%. Doppler evidence of grade I (impaired) diastolic dysfunction. Calculated EF 51%. 2. Left atrial cavity is moderately dilated. 3. Mild (Grade I) aortic regurgitation. 4. Moderate (Grade II) mitral  regurgitation. 5. Moderate tricuspid regurgitation. No evidence of pulmonary hypertension. 6. Echoes of pacemaker lead are visible in right heart.  Lower extremity venous duplex 08/06/2018: No evidence of deep vein thrombosis of the lower extremities with normal venous return.  Lexiscan myoview stress test 08/04/2017: 1. Non-diagnostic due to paced rhythm. 2. Study quality: good. The left ventricular chamber dimensions are normal. There is medium scar of the inferolateral segment with moderate to severe residual ischemia. There is small scar of the inferior segment with very mild residual ischemia. 3. Systolic function is mildly reduced. The calculated stress EF is at 44 %. Gated SPECT imaging demonstrates hypokinesis in the inferior, lateral segment(s). 5. Intermediate risk study. Clinical correlation recommended.  Left Heart Cath and Coronary Angiogram [11/11/2017]: Moderate coronary artery disease. She does have abnormal stress test in inferolateral territory, they are out of proportion to the coronary artery findings. Furthermore, she does not have any critical unstable lesion, to explain nonsustained ventricular tachycardia. She does not have any ongoing chest pain symptoms at this time. Recommend aggressive medical management at this time. If true anginal symptoms occur, could then perform PCI to mid circumflex.  Pacemaker Implant 07/16/2017: Medtronic Azure XT DR MRI Sure Scan (serial number  [id number]) dual chamber pacemaker 07/16/2017 by [facility] for complete heart block.  Recent labs: 11/06/2017: BUN/Cr 14/0.95. eGFR 67 Na 139, K 4.5 H/H 10.9/33.6. MCV 87. Platelets 193 INR 1.0  09/04/2017: Glucose 111, BUNs/creatinine 11/1.14. EGFR 53. Cholesterol 133, HDL 64, LDL 58, triglyceride 57.   Lab Results  Component Value Date   TSH 1.987 07/15/2017    Review of Systems  Constitution: Negative for decreased appetite, malaise/fatigue, weight gain and weight loss.  HENT: Negative for congestion.   Eyes: Negative for visual disturbance.  Cardiovascular: Negative for chest pain, dyspnea on exertion, leg swelling, palpitations and syncope.  Respiratory: Negative for shortness of breath.   Endocrine: Negative for cold intolerance.  Hematologic/Lymphatic: Does not bruise/bleed easily.  Skin: Negative for itching and rash.  Musculoskeletal: Positive for arthritis and stiffness (joints). Negative for myalgias.        Decreased Range of Motion (Right hip).  Gastrointestinal: Negative for abdominal pain, nausea and vomiting.  Genitourinary: Negative for dysuria.  Neurological: Negative for dizziness and weakness.  Psychiatric/Behavioral: The patient is not nervous/anxious.   All other systems reviewed and are negative.      Objective:    Vitals:   11/01/19 1006  BP: 127/68  Pulse: 71  Temp: 98 F (36.7 C)  SpO2: 99%     Physical Exam  Constitutional: She is oriented to person, place, and time. She appears well-developed and well-nourished. No distress.  HENT:  Head: Normocephalic and atraumatic.  Eyes: Pupils are equal, round, and reactive to light. Conjunctivae are normal.  Neck: No JVD present.  Cardiovascular: Normal rate, regular rhythm and intact distal pulses.  Pulmonary/Chest: Effort normal and breath sounds normal. She has no wheezes. She has no rales.  Abdominal: Soft. Bowel sounds are normal. There is no rebound.  Musculoskeletal:        General: No  edema.  Lymphadenopathy:    She has no cervical adenopathy.  Neurological: She is alert and oriented to person, place, and time. No cranial nerve deficit.  Skin: Skin is warm and dry.  Psychiatric: She has a normal mood and affect.  Nursing note and vitals reviewed.      Assessment & Recommendations:   78 year old [place] American female with hypertension, dual-chamber pacemaker placement for AV  block, moderate, medically managed coronary artery disease (LCx), hypertension, NSVT, here for follow up.  NSVT/CAD: 24 beat NSVT at 3:08 AM. Differential for her nonsustained VT episode remains ischemia.  Patient has had these episodes for close to 2 years, without any syncope or cardiac arrest.  Coronary angiogram in 11/2017 showed moderate focal left circumflex stenosis, which was treated medically in absence of any angina symptoms.  I again discussed management options with patient and her daughter.  I do suspect that she is having class II anginal symptoms which could potentially explain her nonsustained VT episodes.  Nonetheless, we will have to wait and see if treatment of ischemia, medically or with stenting, completely resolve her NSVT or not.    At this time, she has had only one episode of angina on current medical therapy, including anti anginal therapy with diltiazem 120 mg daily, metoprolol succinate 50 mg daily. There have been no recurrent NSVT episodes. After discussion with the patient and her daughter, we have mutually decided to continue medical therapy. Conintue Aspirin, rosuvastatin. Check lipid panel. I have encouraged the patient to take SL NTG for as needed use, and keep a log of how many times she requires NTG. She knows to call 911, if chest pain does not improve with 2 SL NTG. I will see her for close f/u in 4 weeks.  Hypertension: Better controlled.  F/u in 4 weeks   [place], MD [facility] Pager: [phone] Office: [phone] If no  answer Cell [phone]

## 2019-11-01 ENCOUNTER — Other Ambulatory Visit (HOSPITAL_COMMUNITY): Payer: Self-pay | Admitting: Cardiology

## 2019-11-01 ENCOUNTER — Encounter: Payer: Self-pay | Admitting: Cardiology

## 2019-11-01 ENCOUNTER — Ambulatory Visit (INDEPENDENT_AMBULATORY_CARE_PROVIDER_SITE_OTHER): Payer: Medicare Other | Admitting: Cardiology

## 2019-11-01 ENCOUNTER — Ambulatory Visit: Payer: Medicare Other | Admitting: Cardiology

## 2019-11-01 ENCOUNTER — Other Ambulatory Visit: Payer: Self-pay

## 2019-11-01 VITALS — BP 127/68 | HR 71 | Temp 98.0°F | Ht 59.0 in | Wt 143.9 lb

## 2019-11-01 DIAGNOSIS — Z950 Presence of cardiac pacemaker: Secondary | ICD-10-CM | POA: Diagnosis not present

## 2019-11-01 DIAGNOSIS — I472 Ventricular tachycardia: Secondary | ICD-10-CM

## 2019-11-01 DIAGNOSIS — I25118 Atherosclerotic heart disease of native coronary artery with other forms of angina pectoris: Secondary | ICD-10-CM

## 2019-11-01 DIAGNOSIS — I10 Essential (primary) hypertension: Secondary | ICD-10-CM | POA: Diagnosis not present

## 2019-11-01 DIAGNOSIS — Z45018 Encounter for adjustment and management of other part of cardiac pacemaker: Secondary | ICD-10-CM | POA: Diagnosis not present

## 2019-11-01 DIAGNOSIS — I4729 Other ventricular tachycardia: Secondary | ICD-10-CM

## 2019-11-01 DIAGNOSIS — I442 Atrioventricular block, complete: Secondary | ICD-10-CM | POA: Diagnosis not present

## 2019-11-02 LAB — LIPID PANEL
Chol/HDL Ratio: 1.9 ratio (ref 0.0–4.4)
Cholesterol, Total: 145 mg/dL (ref 100–199)
HDL: 76 mg/dL (ref >39)
LDL Chol Calc (NIH): 58 mg/dL (ref 0–99)
Triglycerides: 49 mg/dL (ref 0–149)
VLDL Cholesterol Cal: 11 mg/dL (ref 5–40)

## 2019-11-08 NOTE — Progress Notes (Signed)
Left results on VM.

## 2019-11-29 ENCOUNTER — Encounter: Payer: Self-pay | Admitting: Cardiology

## 2019-11-29 ENCOUNTER — Ambulatory Visit (INDEPENDENT_AMBULATORY_CARE_PROVIDER_SITE_OTHER): Payer: Medicare Other | Admitting: Cardiology

## 2019-11-29 ENCOUNTER — Other Ambulatory Visit: Payer: Self-pay

## 2019-11-29 VITALS — BP 143/83 | HR 79 | Temp 97.2°F | Wt 141.0 lb

## 2019-11-29 DIAGNOSIS — I25118 Atherosclerotic heart disease of native coronary artery with other forms of angina pectoris: Secondary | ICD-10-CM

## 2019-11-29 DIAGNOSIS — I4729 Other ventricular tachycardia: Secondary | ICD-10-CM

## 2019-11-29 DIAGNOSIS — I472 Ventricular tachycardia: Secondary | ICD-10-CM | POA: Diagnosis not present

## 2019-11-29 NOTE — Progress Notes (Signed)
Patient is here for follow up visit.  Subjective:   [name]  ID: Holly [name], female    DOB: 1941-10-15, 78 y.o.   MRN: [id number]   Chief Complaint  Patient presents with   Coronary Artery Disease   Chest Pain    HPI  79 year old African American female with hypertension, dual-chamber pacemaker placement for AV block, moderate, medically managed coronary artery disease (LCx), hypertension, NSVT, here for follow up.  Patient recently had a remote transmission of 24 beat NSVT on 09/12/2019 at 03:18 hours for 09 Sec. Health trends do not demonstrate significant abnormality. There have been no recurrent NSVT episodes since then. She denies chest pain, shortness of breath, palpitations, leg edema, orthopnea, PND, TIA/syncope.   Current Outpatient Medications on File Prior to Visit  Medication Sig Dispense Refill   acetaminophen (TYLENOL) 325 MG tablet Take 325 mg by mouth every 4 (four) hours as needed.      ALPRAZolam (XANAX) 0.25 MG tablet Take 1 tablet by mouth as needed.     aspirin EC 81 MG tablet Take 81 mg by mouth daily.     B Complex-C-Calcium (QC B-COMPLEX/VITAMIN C PO) Take 1 tablet by mouth daily.     BIOTIN PO Take 500 mcg by mouth daily.      clobetasol ointment (TEMOVATE) 1.82 % Apply 1 application topically 2 (two) times daily as needed. For skin rash/irritation.  1   Cyanocobalamin (VITAMIN B-12) 2500 MCG SUBL Place 2,500 mcg under the tongue daily.     diclofenac sodium (VOLTAREN) 1 % GEL Apply 4 g topically 4 (four) times daily. To shoulder (Patient taking differently: Apply 4 g topically daily as needed (pain). To shoulder) 1 Tube 1   diltiazem (CARDIZEM CD) 120 MG 24 hr capsule Take 1 capsule (120 mg total) by mouth daily. 90 capsule 3   docusate sodium (COLACE) 50 MG capsule Take 50 mg by mouth as needed for mild constipation.     EUCRISA 2 % OINT Apply 1 application topically daily.     guaifenesin (HUMIBID E) 400 MG TABS tablet Take 400 mg by  mouth as needed.     losartan (COZAAR) 50 MG tablet Take 50 mg by mouth daily.     metoprolol succinate (TOPROL-XL) 50 MG 24 hr tablet Take 1 tablet (50 mg total) by mouth 2 (two) times daily. (Patient taking differently: Take 50 mg by mouth daily. ) 180 tablet 1   nitroGLYCERIN (NITROSTAT) 0.4 MG SL tablet Place 1 tablet (0.4 mg total) under the tongue every 5 (five) minutes as needed for chest pain. 90 tablet 3   olopatadine (PATANOL) 0.1 % ophthalmic solution 1 drop 2 (two) times daily.     PATADAY 0.2 % SOLN Place 1 drop into both eyes daily as needed (for irritated eyes.).      Probiotic Product (PROBIOTIC PO) Take 1 capsule by mouth daily.      RABEprazole (ACIPHEX) 20 MG tablet Take 20 mg by mouth daily as needed (acid reflux).      rosuvastatin (CRESTOR) 10 MG tablet Take 10 mg by mouth daily.     torsemide (DEMADEX) 20 MG tablet USE ONLY IF NEEDED FOR LEG EDEMA. DO NOT NEED TO TAKE IT EVERYDAY. 90 tablet 1   triamcinolone cream (KENALOG) 0.1 % 1 APPLICATION TWICE A DAY AS NEEDED EXTERNALLY 90 DAYS     No current facility-administered medications on file prior to visit.    Cardiovascular studies:  EKG 11/01/2019: SInus rhythm with ventricular  pacing  Scheduled Remote pacemaker check  12.28.20: No AHR or VHR episodes. Health trends are normal. Battery longevity is 8.6 years. RA pacing is 15.8 %, RV pacing is 99.9 %. No further NSVT noted on 09/27/2019.    Scheduled  In office pacemaker check 09/23/19  Single (S)/Dual (D)/BV: D. Presenting AS/VP. Pacemaker dependant:  Yes. Underlying CHB. AP 12.3%, VP 99.7%.  AT/AF burden <0.1% 1 atrial tachycardia episode Longest 00:36 sec. Latest 154 bpm. HVR: NSVT. Longest 00:09. Latest 182 bpm. Longevity 8.7 Years. Lead measurements: Stable. Histogram: Low (L)/normal (N)/high (H)  N. Patient activity <2 hr/day.   Echocardiogram 08/06/2018: 1. Left ventricle cavity is normal in size. Moderate to severe conc. hypertrophy of the left  ventricle. Borderline normal global wall motion. Visual EF is 50-55%. Doppler evidence of grade I (impaired) diastolic dysfunction. Calculated EF 51%. 2. Left atrial cavity is moderately dilated. 3. Mild (Grade I) aortic regurgitation. 4. Moderate (Grade II) mitral regurgitation. 5. Moderate tricuspid regurgitation. No evidence of pulmonary hypertension. 6. Echoes of pacemaker lead are visible in right heart.  Lower extremity venous duplex 08/06/2018: No evidence of deep vein thrombosis of the lower extremities with normal venous return.  Lexiscan myoview stress test 08/04/2017: 1. Non-diagnostic due to paced rhythm. 2. Study quality: good. The left ventricular chamber dimensions are normal. There is medium scar of the inferolateral segment with moderate to severe residual ischemia. There is small scar of the inferior segment with very mild residual ischemia. 3. Systolic function is mildly reduced. The calculated stress EF is at 44 %. Gated SPECT imaging demonstrates hypokinesis in the inferior, lateral segment(s). 5. Intermediate risk study. Clinical correlation recommended.  Coronary angiogram 11/11/2017: LM: Normal LAD: D1 40% stneoses LCx: Tandem 50% stenoses in mid LCx RCA: Normal  Moderate coronary artery disease. She does have abnormal stress test in inferolateral territory, they are out of proportion to the coronary artery findings. Furthermore, she does not have any critical unstable lesion, to explain nonsustained ventricular tachycardia. She does not have any ongoing chest pain symptoms at this time.Recommend aggressive medical management at this time. If true anginal symptoms occur, could then perform PCI to mid circumflex.  Pacemaker Implant 07/16/2017: Medtronic Azure XT DR MRI Sure Scan (serial number [id number]) dual chamber pacemaker 07/16/2017 by [facility] for complete heart block.  Recent labs: 11/06/2017: BUN/Cr 14/0.95. eGFR 67 Na 139, K 4.5 H/H 10.9/33.6. MCV 87. Platelets  193 INR 1.0  09/04/2017: Glucose 111, BUNs/creatinine 11/1.14. EGFR 53. Cholesterol 133, HDL 64, LDL 58, triglyceride 57.   Lab Results  Component Value Date   TSH 1.987 07/15/2017    Review of Systems  Review of Systems  Cardiovascular: Negative for chest pain, dyspnea on exertion, leg swelling, palpitations and syncope.        Objective:    Vitals:   11/29/19 1457 11/29/19 1524  BP: (!) 152/84 (!) 143/83  Pulse: 79   Temp: (!) 97.2 F (36.2 C)   SpO2: 98%      Physical Exam  Physical Exam  Constitutional: She appears well-developed and well-nourished.  Neck: No JVD present.  Cardiovascular: Normal rate, regular rhythm, normal heart sounds and intact distal pulses.  No murmur heard. Pulmonary/Chest: Effort normal and breath sounds normal. She has no wheezes. She has no rales.  Musculoskeletal:        General: No edema.  Nursing note and vitals reviewed.        Assessment & Recommendations:   79 year old [place] American female with hypertension, dual-chamber  pacemaker placement for AV block, moderate, medically managed coronary artery disease (LCx), hypertension, NSVT, here for follow up.  NSVT/CAD: 24 beat NSVT episode in 09/2019. Differential for her nonsustained VT episode remains ischemia.  Patient has had these episodes for close to 2 years, without any syncope or cardiac arrest.  Coronary angiogram in 11/2017 showed moderate focal left circumflex stenosis, which was treated medically in absence of any angina symptoms.  I discussed management options with patient and her daughter in Nov-Dec 2020. She has not had any NSVT and/or angina episodes after being on metoprolol and diltiazem. Continue medical management. Continue Aspirin/statin.  Hypertension: Better controlled.  F/u in 3 months  [place], MD [facility] Pager: [phone] Office: [phone] If no answer Cell [phone]

## 2019-12-09 ENCOUNTER — Other Ambulatory Visit: Payer: Self-pay | Admitting: Cardiology

## 2019-12-30 ENCOUNTER — Other Ambulatory Visit: Payer: Self-pay | Admitting: Cardiology

## 2019-12-30 DIAGNOSIS — R600 Localized edema: Secondary | ICD-10-CM

## 2020-02-25 NOTE — Progress Notes (Deleted)
Patient is here for follow up visit.  Subjective:   _0  ID: Holly [name], female    DOB: 08/10/1941, 79 y.o.   MRN: [id number]   No chief complaint on file.   HPI  79 year old African American female with hypertension, dual-chamber pacemaker placement for AV block, moderate, medically managed coronary artery disease (LCx), hypertension, NSVT, here for follow up.     ***Patient recently had a remote transmission of 24 beat NSVT on 09/12/2019 at 03:18 hours for 09 Sec. Health trends do not demonstrate significant abnormality. There have been no recurrent NSVT episodes since then. She denies chest pain, shortness of breath, palpitations, leg edema, orthopnea, PND, TIA/syncope.   Current Outpatient Medications on File Prior to Visit  Medication Sig Dispense Refill  . acetaminophen (TYLENOL) 325 MG tablet Take 325 mg by mouth every 4 (four) hours as needed.     . ALPRAZolam (XANAX) 0.25 MG tablet Take 1 tablet by mouth as needed.    Marland Kitchen aspirin EC 81 MG tablet Take 81 mg by mouth daily.    . B Complex-C-Calcium (QC B-COMPLEX/VITAMIN C PO) Take 1 tablet by mouth daily.    Marland Kitchen BIOTIN PO Take 500 mcg by mouth daily.     . clobetasol ointment (TEMOVATE) 4.69 % Apply 1 application topically 2 (two) times daily as needed. For skin rash/irritation.  1  . diclofenac sodium (VOLTAREN) 1 % GEL Apply 4 g topically 4 (four) times daily. To shoulder (Patient taking differently: Apply 4 g topically daily as needed (pain). To shoulder) 1 Tube 1  . diltiazem (CARDIZEM CD) 120 MG 24 hr capsule Take 1 capsule (120 mg total) by mouth daily. 90 capsule 3  . docusate sodium (COLACE) 50 MG capsule Take 50 mg by mouth as needed for mild constipation.    . EUCRISA 2 % OINT Apply 1 application topically daily.    Marland Kitchen guaifenesin (HUMIBID E) 400 MG TABS tablet Take 400 mg by mouth as needed.    Marland Kitchen losartan (COZAAR) 50 MG tablet Take 50 mg by mouth daily.    . metoprolol succinate (TOPROL-XL) 50 MG 24 hr tablet  TAKE 1 TABLET BY MOUTH TWICE A DAY 180 tablet 3  . nitroGLYCERIN (NITROSTAT) 0.4 MG SL tablet Place 1 tablet (0.4 mg total) under the tongue every 5 (five) minutes as needed for chest pain. 90 tablet 3  . olopatadine (PATANOL) 0.1 % ophthalmic solution 1 drop 2 (two) times daily.    Marland Kitchen PATADAY 0.2 % SOLN Place 1 drop into both eyes daily as needed (for irritated eyes.).     Marland Kitchen Probiotic Product (PROBIOTIC PO) Take 1 capsule by mouth daily.     . rosuvastatin (CRESTOR) 10 MG tablet Take 10 mg by mouth daily.    Marland Kitchen torsemide (DEMADEX) 20 MG tablet USE ONLY IF NEEDED FOR LEG EDEMA. DO NOT NEED TO TAKE IT EVERYDAY. 90 tablet 1  . triamcinolone cream (KENALOG) 0.1 % 1 APPLICATION TWICE A DAY AS NEEDED EXTERNALLY 90 DAYS     No current facility-administered medications on file prior to visit.    Cardiovascular studies:  EKG 11/01/2019: SInus rhythm with ventricular pacing  Scheduled Remote pacemaker check  12.28.20: No AHR or VHR episodes. Health trends are normal. Battery longevity is 8.6 years. RA pacing is 15.8 %, RV pacing is 99.9 %. No further NSVT noted on 09/27/2019.    Scheduled  In office pacemaker check 09/23/19  Single (S)/Dual (D)/BV: D. Presenting AS/VP. Pacemaker dependant:  Yes.  Underlying CHB. AP 12.3%, VP 99.7%.  AT/AF burden <0.1% 1 atrial tachycardia episode Longest 00:36 sec. Latest 154 bpm. HVR: NSVT. Longest 00:09. Latest 182 bpm. Longevity 8.7 Years. Lead measurements: Stable. Histogram: Low (L)/normal (N)/high (H)  N. Patient activity <2 hr/day.   Echocardiogram 08/06/2018: 1. Left ventricle cavity is normal in size. 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 Borderline normal global wall motion. Visual EF is 50-55%. Doppler evidence of grade I (impaired) diastolic dysfunction. Calculated EF 51%. 2. Left atrial cavity is moderately dilated. 3. Mild (Grade I) aortic regurgitation. 4. Moderate (Grade II) mitral regurgitation. 5. Moderate tricuspid  regurgitation. No evidence of pulmonary hypertension. 6. Echoes of pacemaker lead are visible in right heart.  Lower extremity venous duplex 08/06/2018: No evidence of deep vein thrombosis of the lower extremities with normal venous return.  Lexiscan myoview stress test 08/04/2017: 1. Non-diagnostic due to paced rhythm. 2. Study quality: good. The left ventricular chamber dimensions are normal. There is medium scar of the inferolateral segment with moderate to severe residual ischemia. There is small scar of the inferior segment with very mild residual ischemia. 3. Systolic function is mildly reduced. The calculated stress EF is at 44 %. Gated SPECT imaging demonstrates hypokinesis in the inferior, lateral segment(s). 5. Intermediate risk study. Clinical correlation recommended.  Coronary angiogram 11/11/2017: LM: Normal LAD: D1 40% stneoses LCx: Tandem 50% stenoses in mid LCx RCA: Normal  Moderate coronary artery disease. She does have abnormal stress test in inferolateral territory, they are out of proportion to the coronary artery findings. Furthermore, she does not have any critical unstable lesion, to explain nonsustained ventricular tachycardia. She does not have any ongoing chest pain symptoms at this time.Recommend aggressive medical management at this time. If true anginal symptoms occur, could then perform PCI to mid circumflex.  Pacemaker Implant 07/16/2017: Medtronic Azure XT DR MRI Sure Scan (serial number [id number]) dual chamber pacemaker 07/16/2017 by [facility] for complete heart block.  Recent labs: 11/06/2017: BUN/Cr 14/0.95. eGFR 67 Na 139, K 4.5 H/H 10.9/33.6. MCV 87. Platelets 193 INR 1.0  09/04/2017: Glucose 111, BUNs/creatinine 11/1.14. EGFR 53. Cholesterol 133, HDL 64, LDL 58, triglyceride 57.   Lab Results  Component Value Date   TSH 1.987 07/15/2017    Review of Systems  Review of Systems  Cardiovascular: Negative for chest pain, dyspnea on exertion, leg  swelling, palpitations and syncope.        Objective:    There were no vitals filed for this visit.   Physical Exam  Physical Exam  Constitutional: She appears well-developed and well-nourished.  Neck: No JVD present.  Cardiovascular: Normal rate, regular rhythm, normal heart sounds and intact distal pulses.  No murmur heard. Pulmonary/Chest: Effort normal and breath sounds normal. She has no wheezes. She has no rales.  Musculoskeletal:        General: No edema.  Nursing note and vitals reviewed.        Assessment & Recommendations:   79 year old African American female with hypertension, dual-chamber pacemaker placement for AV block, moderate, medically managed coronary artery disease (LCx), hypertension, NSVT, here for follow up.  *** NSVT/CAD: 24 beat NSVT episode in 09/2019. Differential for her nonsustained VT episode remains ischemia.  Patient has had these episodes for close to 2 years, without any syncope or cardiac arrest.  Coronary angiogram in 11/2017 showed moderate focal left circumflex stenosis, which was treated medically in absence of any angina symptoms.  I discussed management options with patient and her  daughter in [place]. She has not had any NSVT and/or angina episodes after being on metoprolol and diltiazem. Continue medical management. Continue Aspirin/statin.  Hypertension: Better controlled.  F/u in 3 months  [place], MD [facility] [place] Pager: [phone] Office: [phone] If no answer Cell [phone]

## 2020-02-28 ENCOUNTER — Ambulatory Visit: Payer: Medicare Other | Admitting: Cardiology

## 2020-02-29 DIAGNOSIS — I129 Hypertensive chronic kidney disease with stage 1 through stage 4 chronic kidney disease, or unspecified chronic kidney disease: Secondary | ICD-10-CM | POA: Diagnosis not present

## 2020-02-29 DIAGNOSIS — E782 Mixed hyperlipidemia: Secondary | ICD-10-CM | POA: Diagnosis not present

## 2020-03-14 NOTE — Progress Notes (Signed)
Patient is here for follow up visit.  Subjective:   @Patient  ID: Holly [name], female    DOB: 1940/11/24, 79 y.o.   MRN: [id number]   Chief Complaint  Patient presents with  . Shortness of Breath    HPI  79 year old African American female with hypertension, dual-chamber pacemaker placement for AV block, moderate, medically managed coronary artery disease (LCx), hypertension, NSVT, here for follow up.  Patient has been doing well and has not any overt chest pain or shortness of breath. Her primary complaints remain re: her joint pains.   Current Outpatient Medications on File Prior to Visit  Medication Sig Dispense Refill  . ALPRAZolam (XANAX) 0.25 MG tablet Take 1 tablet by mouth as needed.    Marland Kitchen aspirin EC 81 MG tablet Take 81 mg by mouth daily.    . clobetasol ointment (TEMOVATE) 4.23 % Apply 1 application topically 2 (two) times daily as needed. For skin rash/irritation.  1  . diclofenac sodium (VOLTAREN) 1 % GEL Apply 4 g topically 4 (four) times daily. To shoulder (Patient taking differently: Apply 4 g topically daily as needed (pain). To shoulder) 1 Tube 1  . diltiazem (CARDIZEM CD) 120 MG 24 hr capsule Take 1 capsule (120 mg total) by mouth daily. 90 capsule 3  . docusate sodium (COLACE) 50 MG capsule Take 50 mg by mouth as needed for mild constipation.    . EUCRISA 2 % OINT Apply 1 application topically daily.    Marland Kitchen losartan (COZAAR) 50 MG tablet Take 50 mg by mouth daily.    . metoprolol succinate (TOPROL-XL) 50 MG 24 hr tablet TAKE 1 TABLET BY MOUTH TWICE A DAY 180 tablet 3  . nitroGLYCERIN (NITROSTAT) 0.4 MG SL tablet Place 1 tablet (0.4 mg total) under the tongue every 5 (five) minutes as needed for chest pain. 90 tablet 3  . olopatadine (PATANOL) 0.1 % ophthalmic solution 1 drop 2 (two) times daily.    Marland Kitchen PATADAY 0.2 % SOLN Place 1 drop into both eyes daily as needed (for irritated eyes.).     Marland Kitchen Probiotic Product (PROBIOTIC PO) Take 1 capsule by mouth daily.     .  rosuvastatin (CRESTOR) 10 MG tablet Take 10 mg by mouth daily.    Marland Kitchen torsemide (DEMADEX) 20 MG tablet USE ONLY IF NEEDED FOR LEG EDEMA. DO NOT NEED TO TAKE IT EVERYDAY. 90 tablet 1  . triamcinolone cream (KENALOG) 0.1 % 1 APPLICATION TWICE A DAY AS NEEDED EXTERNALLY 90 DAYS    . acetaminophen (TYLENOL) 325 MG tablet Take 325 mg by mouth every 4 (four) hours as needed.     Marland Kitchen guaifenesin (HUMIBID E) 400 MG TABS tablet Take 400 mg by mouth as needed.     No current facility-administered medications on file prior to visit.    Cardiovascular studies:  EKG 03/15/2020: Sinus rhythm 77 bpm with ventricular pacing.  Scheduled Remote pacemaker check  11/01/19: No AHR or VHR episodes. Health trends are normal. Battery longevity is 8.6 years. RA pacing is 15.8 %, RV pacing is 99.9 %. No further NSVT noted on 09/27/2019.   Scheduled  In office pacemaker check 09/23/19:  Single (S)/Dual (D)/BV: D. Presenting AS/VP. Pacemaker dependant:  Yes. Underlying CHB. AP 12.3%, VP 99.7%.  AT/AF burden <0.1% 1 atrial tachycardia episode Longest 00:36 sec. Latest 154 bpm. HVR: NSVT. Longest 00:09. Latest 182 bpm. Longevity 8.7 Years. Lead measurements: Stable. Histogram: Low (L)/normal (N)/high (H)  N. Patient activity <2 hr/day.   Echocardiogram  08/06/2018: 1. Left ventricle cavity is normal in size. 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 Borderline normal global wall motion. Visual EF is 50-55%. Doppler evidence of grade I (impaired) diastolic dysfunction. Calculated EF 51%. 2. Left atrial cavity is moderately dilated. 3. Mild (Grade I) aortic regurgitation. 4. Moderate (Grade II) mitral regurgitation. 5. Moderate tricuspid regurgitation. No evidence of pulmonary hypertension. 6. Echoes of pacemaker lead are visible in right heart.  Lower extremity venous duplex 08/06/2018: No evidence of deep vein thrombosis of the lower extremities with normal venous return.  Lexiscan myoview stress  test 08/04/2017: 1. Non-diagnostic due to paced rhythm. 2. Study quality: good. The left ventricular chamber dimensions are normal. There is medium scar of the inferolateral segment with moderate to severe residual ischemia. There is small scar of the inferior segment with very mild residual ischemia. 3. Systolic function is mildly reduced. The calculated stress EF is at 44 %. Gated SPECT imaging demonstrates hypokinesis in the inferior, lateral segment(s). 5. Intermediate risk study. Clinical correlation recommended.  Coronary angiogram 11/11/2017: LM: Normal LAD: D1 40% stneoses LCx: Tandem 50% stenoses in mid LCx RCA: Normal  Moderate coronary artery disease. She does have abnormal stress test in inferolateral territory, they are out of proportion to the coronary artery findings. Furthermore, she does not have any critical unstable lesion, to explain nonsustained ventricular tachycardia. She does not have any ongoing chest pain symptoms at this time.Recommend aggressive medical management at this time. If true anginal symptoms occur, could then perform PCI to mid circumflex.  Pacemaker Implant 07/16/2017: Medtronic Azure XT DR MRI Sure Scan (serial number [id number]) dual chamber pacemaker 07/16/2017 by [facility] for complete heart block.  Recent labs: 02/29/2020: Chol 102, TG 52, HDL 49, LDL 52  09/2019: BUN/Cr 33/1.3 HbA1C 5.3% TSH 2.6 normal   Lab Results  Component Value Date   TSH 1.987 07/15/2017    Review of Systems  Review of Systems  Cardiovascular: Negative for chest pain, dyspnea on exertion, leg swelling, palpitations and syncope.        Objective:    Vitals:   03/15/20 1613  BP: (!) 141/75  Pulse: 76  Temp: 98.1 F (36.7 C)  SpO2: 100%     Physical Exam  Physical Exam  Constitutional: She appears well-developed and well-nourished.  Neck: No JVD present.  Cardiovascular: Normal rate, regular rhythm, normal heart sounds and intact distal pulses.  No murmur  heard. Pulmonary/Chest: Effort normal and breath sounds normal. She has no wheezes. She has no rales.  Musculoskeletal:        General: No edema.  Nursing note and vitals reviewed.        Assessment & Recommendations:   79 year old [place] American female with hypertension, dual-chamber pacemaker placement for AV block, moderate, medically managed coronary artery disease (LCx), hypertension, NSVT, here for follow up.  NSVT/CAD: 24 beat NSVT episode in 09/2019. Differential for her nonsustained VT episode remains ischemia.  Patient has had these episodes for close to 2 years, without any syncope or cardiac arrest.  Coronary angiogram in 11/2017 showed moderate focal left circumflex stenosis, which was treated medically in absence of any angina symptoms.  I discussed management options with patient and her daughter in Nov-Dec 2020. She has not had any NSVT and/or angina episodes after being on metoprolol and diltiazem. She has also note had any recurrent angina symptoms. Reviewed recent lipid panel, well controlled.   Continue medical management. Continue Aspirin/statin.  Hypertension:  Better controlled.  F/u  in 6 months  Laconya Clere Esther Hardy, MD [facility] Pager: [phone] Office: [phone] If no answer Cell [phone]

## 2020-03-15 ENCOUNTER — Encounter: Payer: Self-pay | Admitting: Cardiology

## 2020-03-15 ENCOUNTER — Ambulatory Visit: Payer: Medicare Other | Admitting: Cardiology

## 2020-03-15 ENCOUNTER — Other Ambulatory Visit: Payer: Self-pay

## 2020-03-15 VITALS — BP 141/75 | HR 76 | Temp 98.1°F | Ht 59.0 in | Wt 132.0 lb

## 2020-03-15 DIAGNOSIS — I10 Essential (primary) hypertension: Secondary | ICD-10-CM | POA: Diagnosis not present

## 2020-03-15 DIAGNOSIS — I472 Ventricular tachycardia: Secondary | ICD-10-CM | POA: Diagnosis not present

## 2020-03-15 DIAGNOSIS — R0600 Dyspnea, unspecified: Secondary | ICD-10-CM | POA: Insufficient documentation

## 2020-03-15 DIAGNOSIS — R0609 Other forms of dyspnea: Secondary | ICD-10-CM | POA: Insufficient documentation

## 2020-03-15 DIAGNOSIS — I25118 Atherosclerotic heart disease of native coronary artery with other forms of angina pectoris: Secondary | ICD-10-CM | POA: Diagnosis not present

## 2020-03-15 DIAGNOSIS — I4729 Other ventricular tachycardia: Secondary | ICD-10-CM

## 2020-03-17 ENCOUNTER — Ambulatory Visit: Payer: Medicare Other | Admitting: Cardiology

## 2020-03-22 DIAGNOSIS — R0602 Shortness of breath: Secondary | ICD-10-CM | POA: Diagnosis not present

## 2020-03-22 DIAGNOSIS — R5383 Other fatigue: Secondary | ICD-10-CM | POA: Diagnosis not present

## 2020-03-22 DIAGNOSIS — K219 Gastro-esophageal reflux disease without esophagitis: Secondary | ICD-10-CM | POA: Diagnosis not present

## 2020-03-30 ENCOUNTER — Encounter: Payer: Self-pay | Admitting: Cardiology

## 2020-03-30 ENCOUNTER — Other Ambulatory Visit: Payer: Self-pay

## 2020-03-30 ENCOUNTER — Ambulatory Visit: Payer: Medicare Other | Admitting: Cardiology

## 2020-03-30 VITALS — BP 137/72 | HR 70 | Ht 59.0 in | Wt 125.0 lb

## 2020-03-30 DIAGNOSIS — I25118 Atherosclerotic heart disease of native coronary artery with other forms of angina pectoris: Secondary | ICD-10-CM | POA: Diagnosis not present

## 2020-03-30 DIAGNOSIS — R600 Localized edema: Secondary | ICD-10-CM

## 2020-03-30 DIAGNOSIS — I10 Essential (primary) hypertension: Secondary | ICD-10-CM | POA: Diagnosis not present

## 2020-03-30 DIAGNOSIS — R7989 Other specified abnormal findings of blood chemistry: Secondary | ICD-10-CM | POA: Diagnosis not present

## 2020-03-30 DIAGNOSIS — I442 Atrioventricular block, complete: Secondary | ICD-10-CM | POA: Diagnosis not present

## 2020-03-30 MED ORDER — TORSEMIDE 20 MG PO TABS: ORAL_TABLET | ORAL | 1 refills | Status: DC

## 2020-03-30 NOTE — Progress Notes (Deleted)
Patient is here for follow up visit.  Subjective:   [name]  ID: Holly [name], female    DOB: 1941-05-29, 79 y.o.   MRN: [id number]   No chief complaint on file.   HPI  79 y.o. African American female with hypertension, dual-chamber pacemaker placement for AV block, moderate, medically managed coronary artery disease (LCx), hypertension, NSVT, here for follow up.  ***At last visit in 03/2020, ***  ***  ***Previous OV HPI on 03/2020: Patient has been doing well and has not any overt chest pain or shortness of breath. Her primary complaints remain re: her joint pains.   *** Current Outpatient Medications on File Prior to Visit  Medication Sig Dispense Refill  . acetaminophen (TYLENOL) 325 MG tablet Take 325 mg by mouth every 4 (four) hours as needed.     . ALPRAZolam (XANAX) 0.25 MG tablet Take 1 tablet by mouth as needed.    Marland Kitchen aspirin EC 81 MG tablet Take 81 mg by mouth daily.    . clobetasol ointment (TEMOVATE) 2.99 % Apply 1 application topically 2 (two) times daily as needed. For skin rash/irritation.  1  . diclofenac sodium (VOLTAREN) 1 % GEL Apply 4 g topically 4 (four) times daily. To shoulder (Patient taking differently: Apply 4 g topically daily as needed (pain). To shoulder) 1 Tube 1  . diltiazem (CARDIZEM CD) 120 MG 24 hr capsule Take 1 capsule (120 mg total) by mouth daily. 90 capsule 3  . docusate sodium (COLACE) 50 MG capsule Take 50 mg by mouth as needed for mild constipation.    . EUCRISA 2 % OINT Apply 1 application topically daily.    Marland Kitchen guaifenesin (HUMIBID E) 400 MG TABS tablet Take 400 mg by mouth as needed.    Marland Kitchen losartan (COZAAR) 50 MG tablet Take 50 mg by mouth daily.    . metoprolol succinate (TOPROL-XL) 50 MG 24 hr tablet TAKE 1 TABLET BY MOUTH TWICE A DAY 180 tablet 3  . nitroGLYCERIN (NITROSTAT) 0.4 MG SL tablet Place 1 tablet (0.4 mg total) under the tongue every 5 (five) minutes as needed for chest pain. 90 tablet 3  . olopatadine (PATANOL) 0.1 %  ophthalmic solution 1 drop 2 (two) times daily.    Marland Kitchen PATADAY 0.2 % SOLN Place 1 drop into both eyes daily as needed (for irritated eyes.).     Marland Kitchen Probiotic Product (PROBIOTIC PO) Take 1 capsule by mouth daily.     . rosuvastatin (CRESTOR) 10 MG tablet Take 10 mg by mouth daily.    Marland Kitchen torsemide (DEMADEX) 20 MG tablet USE ONLY IF NEEDED FOR LEG EDEMA. DO NOT NEED TO TAKE IT EVERYDAY. 90 tablet 1  . triamcinolone cream (KENALOG) 0.1 % 1 APPLICATION TWICE A DAY AS NEEDED EXTERNALLY 90 DAYS     No current facility-administered medications on file prior to visit.    Cardiovascular studies: ***  EKG ***/***/202***: ***  EKG 03/15/2020: Sinus rhythm 77 bpm with ventricular pacing.  Scheduled Remote pacemaker check  11/01/19: No AHR or VHR episodes. Health trends are normal. Battery longevity is 8.6 years. RA pacing is 15.8 %, RV pacing is 99.9 %. No further NSVT noted on 09/27/2019.   Scheduled  In office pacemaker check 09/23/19:  Single (S)/Dual (D)/BV: D. Presenting AS/VP. Pacemaker dependant:  Yes. Underlying CHB. AP 12.3%, VP 99.7%.  AT/AF burden <0.1% 1 atrial tachycardia episode Longest 00:36 sec. Latest 154 bpm. HVR: NSVT. Longest 00:09. Latest 182 bpm. Longevity 8.7 Years. Lead measurements: Stable.  Histogram: Low (L)/normal (N)/high (H)  N. Patient activity <2 hr/day.   Echocardiogram 08/06/2018: 1. Left ventricle cavity is normal in size. 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 Borderline normal global wall motion. Visual EF is 50-55%. Doppler evidence of grade I (impaired) diastolic dysfunction. Calculated EF 51%. 2. Left atrial cavity is moderately dilated. 3. Mild (Grade I) aortic regurgitation. 4. Moderate (Grade II) mitral regurgitation. 5. Moderate tricuspid regurgitation. No evidence of pulmonary hypertension. 6. Echoes of pacemaker lead are visible in right heart.  Lower extremity venous duplex 08/06/2018: No evidence of deep vein thrombosis of the  lower extremities with normal venous return.  Lexiscan myoview stress test 08/04/2017: 1. Non-diagnostic due to paced rhythm. 2. Study quality: good. The left ventricular chamber dimensions are normal. There is medium scar of the inferolateral segment with moderate to severe residual ischemia. There is small scar of the inferior segment with very mild residual ischemia. 3. Systolic function is mildly reduced. The calculated stress EF is at 44 %. Gated SPECT imaging demonstrates hypokinesis in the inferior, lateral segment(s). 5. Intermediate risk study. Clinical correlation recommended.  Coronary angiogram 11/11/2017: LM: Normal LAD: D1 40% stneoses LCx: Tandem 50% stenoses in mid LCx RCA: Normal  Moderate coronary artery disease. She does have abnormal stress test in inferolateral territory, they are out of proportion to the coronary artery findings. Furthermore, she does not have any critical unstable lesion, to explain nonsustained ventricular tachycardia. She does not have any ongoing chest pain symptoms at this time.Recommend aggressive medical management at this time. If true anginal symptoms occur, could then perform PCI to mid circumflex.  Pacemaker Implant 07/16/2017: Medtronic Azure XT DR MRI Sure Scan (serial number [id number]) dual chamber pacemaker 07/16/2017 by [facility] for complete heart block.   *** Recent labs: 03/22/2020: ***cant access scan  Glucose ***, BUN/Cr ***/***. EGFR 44. Na/K ***/***. ***Rest of the CMP normal H/H ***/***. MCV ***. Platelets *** ***HbA1C ***% Chol ***, TG ***, HDL ***, LDL *** ***TSH ***normal  02/29/2020: Chol 102, TG 52, HDL 49, LDL 52  09/2019: BUN/Cr 33/1.3 HbA1C 5.3% TSH 2.6 normal  2018: TSH 1.987 normal   Review of Systems  Review of Systems  Cardiovascular: Negative for chest pain, dyspnea on exertion, leg swelling, palpitations and syncope.        Objective:    There were no vitals filed for this visit.   Physical Exam    Physical Exam  Constitutional: She appears well-developed and well-nourished.  Neck: No JVD present.  Cardiovascular: Normal rate, regular rhythm, normal heart sounds and intact distal pulses.  No murmur heard. Pulmonary/Chest: Effort normal and breath sounds normal. She has no wheezes. She has no rales.  Musculoskeletal:        General: No edema.  Nursing note and vitals reviewed.        Assessment & Recommendations:   79 y.o. African American female with hypertension, dual-chamber pacemaker placement for AV block, moderate, medically managed coronary artery disease (LCx), hypertension, NSVT, here for follow up.  ***  F/u in ***   *** NSVT/CAD: 24 beat NSVT episode in 09/2019. Differential for her nonsustained VT episode remains ischemia.  Patient has had these episodes for close to 2 years, without any syncope or cardiac arrest.  Coronary angiogram in 11/2017 showed moderate focal left circumflex stenosis, which was treated medically in absence of any angina symptoms.  I discussed management options with patient and her daughter in Nov-Dec 2020. She has not had any  NSVT and/or angina episodes after being on metoprolol and diltiazem. She has also note had any recurrent angina symptoms. Reviewed recent lipid panel, well controlled.   Continue medical management. Continue Aspirin/statin.  Hypertension:  Better controlled. ***  F/u in 6 months  [place], MD [facility] Pager: [phone] Office: [phone] If no answer Cell [phone]

## 2020-03-30 NOTE — Progress Notes (Signed)
Patient is here for follow up visit.  Subjective:   [name]  ID: Holly [name], female    DOB: 03-29-1941, 79 y.o.   MRN: [id number]  Chief Complaint  Patient presents with  . Congestive Heart Failure  . Follow-up    HPI  79 year old African American female with hypertension, dual-chamber pacemaker placement for AV block, moderate, medically managed coronary artery disease (LCx), hypertension, NSVT.  In the last two weeks since she saw me, she had developed episodic shortness of breath. Workup with PCP showed elevated BNP. Shortness of breath has improved after starting torsemide.    Current Outpatient Medications on File Prior to Visit  Medication Sig Dispense Refill  . acetaminophen (TYLENOL) 325 MG tablet Take 325 mg by mouth every 4 (four) hours as needed.     . ALPRAZolam (XANAX) 0.25 MG tablet Take 1 tablet by mouth as needed.    Marland Kitchen aspirin EC 81 MG tablet Take 81 mg by mouth daily.    . clobetasol ointment (TEMOVATE) 9.93 % Apply 1 application topically 2 (two) times daily as needed. For skin rash/irritation.  1  . diclofenac sodium (VOLTAREN) 1 % GEL Apply 4 g topically 4 (four) times daily. To shoulder (Patient taking differently: Apply 4 g topically daily as needed (pain). To shoulder) 1 Tube 1  . diltiazem (CARDIZEM CD) 120 MG 24 hr capsule Take 1 capsule (120 mg total) by mouth daily. 90 capsule 3  . docusate sodium (COLACE) 50 MG capsule Take 50 mg by mouth as needed for mild constipation.    . EUCRISA 2 % OINT Apply 1 application topically daily.    Marland Kitchen guaifenesin (HUMIBID E) 400 MG TABS tablet Take 400 mg by mouth as needed.    Marland Kitchen losartan (COZAAR) 50 MG tablet Take 50 mg by mouth daily.    . metoprolol succinate (TOPROL-XL) 50 MG 24 hr tablet TAKE 1 TABLET BY MOUTH TWICE A DAY 180 tablet 3  . nitroGLYCERIN (NITROSTAT) 0.4 MG SL tablet Place 1 tablet (0.4 mg total) under the tongue every 5 (five) minutes as needed for chest pain. 90 tablet 3  . olopatadine (PATANOL)  0.1 % ophthalmic solution 1 drop 2 (two) times daily.    Marland Kitchen PATADAY 0.2 % SOLN Place 1 drop into both eyes daily as needed (for irritated eyes.).     Marland Kitchen Probiotic Product (PROBIOTIC PO) Take 1 capsule by mouth daily.     . rosuvastatin (CRESTOR) 10 MG tablet Take 10 mg by mouth daily.    Marland Kitchen torsemide (DEMADEX) 20 MG tablet USE ONLY IF NEEDED FOR LEG EDEMA. DO NOT NEED TO TAKE IT EVERYDAY. 90 tablet 1  . triamcinolone cream (KENALOG) 0.1 % 1 APPLICATION TWICE A DAY AS NEEDED EXTERNALLY 90 DAYS     No current facility-administered medications on file prior to visit.    Cardiovascular studies:  EKG 03/15/2020: Sinus rhythm 77 bpm with ventricular pacing.  Scheduled Remote pacemaker check  11/01/19: No AHR or VHR episodes. Health trends are normal. Battery longevity is 8.6 years. RA pacing is 15.8 %, RV pacing is 99.9 %. No further NSVT noted on 09/27/2019.   Scheduled  In office pacemaker check 09/23/19:  Single (S)/Dual (D)/BV: D. Presenting AS/VP. Pacemaker dependant:  Yes. Underlying CHB. AP 12.3%, VP 99.7%.  AT/AF burden <0.1% 1 atrial tachycardia episode Longest 00:36 sec. Latest 154 bpm. HVR: NSVT. Longest 00:09. Latest 182 bpm. Longevity 8.7 Years. Lead measurements: Stable. Histogram: Low (L)/normal (N)/high (H)  N. Patient activity <  2 hr/day.   Echocardiogram 08/06/2018: 1. Left ventricle cavity is normal in size. 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 Borderline normal global wall motion. Visual EF is 50-55%. Doppler evidence of grade I (impaired) diastolic dysfunction. Calculated EF 51%. 2. Left atrial cavity is moderately dilated. 3. Mild (Grade I) aortic regurgitation. 4. Moderate (Grade II) mitral regurgitation. 5. Moderate tricuspid regurgitation. No evidence of pulmonary hypertension. 6. Echoes of pacemaker lead are visible in right heart.  Lower extremity venous duplex 08/06/2018: No evidence of deep vein thrombosis of the lower extremities with normal  venous return.  Lexiscan myoview stress test 08/04/2017: 1. Non-diagnostic due to paced rhythm. 2. Study quality: good. The left ventricular chamber dimensions are normal. There is medium scar of the inferolateral segment with moderate to severe residual ischemia. There is small scar of the inferior segment with very mild residual ischemia. 3. Systolic function is mildly reduced. The calculated stress EF is at 44 %. Gated SPECT imaging demonstrates hypokinesis in the inferior, lateral segment(s). 5. Intermediate risk study. Clinical correlation recommended.  Coronary angiogram 11/11/2017: LM: Normal LAD: D1 40% stneoses LCx: Tandem 50% stenoses in mid LCx RCA: Normal  Moderate coronary artery disease. She does have abnormal stress test in inferolateral territory, they are out of proportion to the coronary artery findings. Furthermore, she does not have any critical unstable lesion, to explain nonsustained ventricular tachycardia. She does not have any ongoing chest pain symptoms at this time.Recommend aggressive medical management at this time. If true anginal symptoms occur, could then perform PCI to mid circumflex.  Pacemaker Implant 07/16/2017: Medtronic Azure XT DR MRI Sure Scan (serial number [id number]) dual chamber pacemaker 07/16/2017 by [facility] for complete heart block.  Recent labs:  03/22/2020: Glucose 91, BUN/Cr 22/1.18. EGFR 51. Na/K 139/4.1. Calcium 11.7. Rest of the CMP normal H/H 10.5/31.7. MCV 85. Platelets 220 BNP 2896  02/29/2020: Chol 102, TG 52, HDL 49, LDL 52  09/2019: BUN/Cr 33/1.3 HbA1C 5.3% TSH 2.6 normal   Lab Results  Component Value Date   TSH 1.987 07/15/2017    Review of Systems  Review of Systems  Cardiovascular: Negative for chest pain, dyspnea on exertion, leg swelling, palpitations and syncope.        Objective:    Vitals:   03/30/20 1522  BP: 137/72  Pulse: 70  SpO2: 96%     Physical Exam  Physical Exam  Constitutional: She appears  well-developed and well-nourished.  Neck: No JVD present.  Cardiovascular: Normal rate, regular rhythm, normal heart sounds and intact distal pulses.  No murmur heard. Pulmonary/Chest: Effort normal and breath sounds normal. She has no wheezes. She has no rales.  Musculoskeletal:        General: No edema (Trace b/l).  Nursing note and vitals reviewed.        Assessment & Recommendations:   79 year old [place] American female with hypertension, dual-chamber pacemaker placement for AV block, moderate, medically managed coronary artery disease (LCx), hypertension, NSVT, here for follow up.  Elevated BNP: No florid heart failure. Continue torsemide 20 mg daily. Will check echocardiogram and repeat BNP in 3 weeks.   NSVT/CAD: 24 beat NSVT episode in 09/2019. Differential for her nonsustained VT episode remains ischemia.  Patient has had these episodes for close to 2 years, without any syncope or cardiac arrest.  Coronary angiogram in 11/2017 showed moderate focal left circumflex stenosis, which was treated medically in absence of any angina symptoms.  I discussed management options with patient and her  daughter in [place]. She has not had any NSVT and/or angina episodes after being on metoprolol and diltiazem. She has also note had any recurrent angina symptoms. Reviewed recent lipid panel, well controlled.   Continue medical management. Continue Aspirin/statin.  Hypertension:  Better controlled.  F/u in 6 months  Fidela Cieslak Esther Hardy, MD [facility] Pager: [phone] Office: [phone] If no answer Cell [phone]

## 2020-04-04 ENCOUNTER — Other Ambulatory Visit: Payer: Self-pay

## 2020-04-04 ENCOUNTER — Ambulatory Visit: Payer: Medicare Other

## 2020-04-04 DIAGNOSIS — R5383 Other fatigue: Secondary | ICD-10-CM | POA: Diagnosis not present

## 2020-04-04 DIAGNOSIS — I25118 Atherosclerotic heart disease of native coronary artery with other forms of angina pectoris: Secondary | ICD-10-CM | POA: Diagnosis not present

## 2020-04-04 DIAGNOSIS — I5041 Acute combined systolic (congestive) and diastolic (congestive) heart failure: Secondary | ICD-10-CM | POA: Diagnosis not present

## 2020-04-04 DIAGNOSIS — R7989 Other specified abnormal findings of blood chemistry: Secondary | ICD-10-CM

## 2020-04-06 ENCOUNTER — Other Ambulatory Visit (HOSPITAL_COMMUNITY): Payer: Self-pay | Admitting: Cardiology

## 2020-04-06 DIAGNOSIS — R7989 Other specified abnormal findings of blood chemistry: Secondary | ICD-10-CM | POA: Diagnosis not present

## 2020-04-06 DIAGNOSIS — R0602 Shortness of breath: Secondary | ICD-10-CM | POA: Diagnosis not present

## 2020-04-07 ENCOUNTER — Ambulatory Visit: Payer: Medicare Other | Admitting: Cardiology

## 2020-04-07 LAB — BRAIN NATRIURETIC PEPTIDE: BNP: 1371.4 pg/mL — ABNORMAL HIGH (ref 0.0–100.0)

## 2020-04-10 ENCOUNTER — Encounter: Payer: Self-pay | Admitting: Cardiology

## 2020-04-10 ENCOUNTER — Other Ambulatory Visit: Payer: Self-pay

## 2020-04-10 ENCOUNTER — Ambulatory Visit: Payer: Medicare Other | Admitting: Cardiology

## 2020-04-10 VITALS — BP 138/72 | HR 68 | Resp 15 | Ht 59.0 in | Wt 129.0 lb

## 2020-04-10 DIAGNOSIS — I10 Essential (primary) hypertension: Secondary | ICD-10-CM

## 2020-04-10 DIAGNOSIS — I25118 Atherosclerotic heart disease of native coronary artery with other forms of angina pectoris: Secondary | ICD-10-CM

## 2020-04-10 DIAGNOSIS — I340 Nonrheumatic mitral (valve) insufficiency: Secondary | ICD-10-CM | POA: Insufficient documentation

## 2020-04-10 DIAGNOSIS — I5021 Acute systolic (congestive) heart failure: Secondary | ICD-10-CM | POA: Insufficient documentation

## 2020-04-10 DIAGNOSIS — I5020 Unspecified systolic (congestive) heart failure: Secondary | ICD-10-CM

## 2020-04-10 DIAGNOSIS — R7989 Other specified abnormal findings of blood chemistry: Secondary | ICD-10-CM | POA: Diagnosis not present

## 2020-04-10 DIAGNOSIS — I4729 Other ventricular tachycardia: Secondary | ICD-10-CM

## 2020-04-10 DIAGNOSIS — I472 Ventricular tachycardia: Secondary | ICD-10-CM | POA: Diagnosis not present

## 2020-04-10 MED ORDER — ENTRESTO 49-51 MG PO TABS: 1.0000 | ORAL_TABLET | Freq: Two times a day (BID) | ORAL | 1 refills | Status: DC

## 2020-04-10 NOTE — Progress Notes (Addendum)
Patient is here for follow up visit.  Subjective:   [name]  ID: Holly [name], female    DOB: 1941-02-02, 79 y.o.   MRN: [id number]   Chief Complaint  Patient presents with  . Follow-up    4 week  . Results    echo    HPI  79 year old African American female with hypertension, dual-chamber pacemaker placement for AV block, moderate, medically managed coronary artery disease (LCx), hypertension, NSVT, now with acute systolic heart failure  Patient was recently seen by me due to shortness of breath and elevated BNP at 2800.  Subsequent echocardiogram showed new systolic dysfunction 50-35%, with moderate mitral regurgitation, and severe left atrial dilatation.  Patient's biggest complaint at this point is her left shoulder pain, that is worse with certain movements.  It is unrelated to exertion.  Her breathing has improved.  Current Outpatient Medications on File Prior to Visit  Medication Sig Dispense Refill  . acetaminophen (TYLENOL) 325 MG tablet Take 325 mg by mouth every 4 (four) hours as needed.     . ALPRAZolam (XANAX) 0.25 MG tablet Take 1 tablet by mouth as needed.    Marland Kitchen aspirin EC 81 MG tablet Take 81 mg by mouth daily.    . clobetasol ointment (TEMOVATE) 4.65 % Apply 1 application topically 2 (two) times daily as needed. For skin rash/irritation.  1  . diclofenac sodium (VOLTAREN) 1 % GEL Apply 4 g topically 4 (four) times daily. To shoulder (Patient taking differently: Apply 4 g topically daily as needed (pain). To shoulder) 1 Tube 1  . diltiazem (CARDIZEM CD) 120 MG 24 hr capsule Take 1 capsule (120 mg total) by mouth daily. 90 capsule 3  . docusate sodium (COLACE) 50 MG capsule Take 50 mg by mouth as needed for mild constipation.    . EUCRISA 2 % OINT Apply 1 application topically daily.    Marland Kitchen losartan (COZAAR) 50 MG tablet Take 50 mg by mouth daily.    . metoprolol succinate (TOPROL-XL) 50 MG 24 hr tablet TAKE 1 TABLET BY MOUTH TWICE A DAY 180 tablet 3  .  nitroGLYCERIN (NITROSTAT) 0.4 MG SL tablet Place 1 tablet (0.4 mg total) under the tongue every 5 (five) minutes as needed for chest pain. 90 tablet 3  . olopatadine (PATANOL) 0.1 % ophthalmic solution 1 drop 2 (two) times daily.    Marland Kitchen PATADAY 0.2 % SOLN Place 1 drop into both eyes daily as needed (for irritated eyes.).     Marland Kitchen Potassium Chloride Crys ER (KLOR-CON M20 PO) Take 1 tablet by mouth daily.    . Probiotic Product (PROBIOTIC PO) Take 1 capsule by mouth daily.     . rosuvastatin (CRESTOR) 10 MG tablet Take 10 mg by mouth daily.    Marland Kitchen torsemide (DEMADEX) 20 MG tablet One tablet daily 90 tablet 1  . triamcinolone cream (KENALOG) 0.1 % 1 APPLICATION TWICE A DAY AS NEEDED EXTERNALLY 90 DAYS     No current facility-administered medications on file prior to visit.    Cardiovascular studies:  Echocardiogram 04/04/2020:  Severely depressed LV systolic function with visual EF 25-30%. Left  ventricle cavity is normal in size. Moderate to severe left ventricular  hypertrophy. No obvious regional wall motion abnormalities. Grade II  diastolic dysfunction, elevated LAP.  Left atrial cavity is severely dilated 61m/m2. Interatrial septum bulges  to the right suggests elevated LAP. The interatrial Septum is thin and  mobile but appears to be intact by 2D and CF Doppler  interrogation.  Endocardial device lead noted in the right cardiac chambers.  Mild to moderate aortic regurgitation.  Moderate (Grade II) mitral regurgitation.  Mild tricuspid regurgitation. No evidence of pulmonary hypertension. RVSP  measures 32 mmHg.  Mild pulmonic regurgitation.  Small pericardial effusion located posteriorly.  Compared to previous study dated 08/06/2018, LVEF is now reduced from 50% to  30% , now Grade 2 DD, and LA is now severely dilated.   Scheduled Remote pacemaker check  11/01/19: No AHR or VHR episodes. Health trends are normal. Battery longevity is 8.6 years. RA pacing is 15.8 %, RV pacing is 99.9 %.  No further NSVT noted on 09/27/2019.   Scheduled  In office pacemaker check 09/23/19:  Single (S)/Dual (D)/BV: D. Presenting AS/VP. Pacemaker dependant:  Yes. Underlying CHB. AP 12.3%, VP 99.7%.  AT/AF burden <0.1% 1 atrial tachycardia episode Longest 00:36 sec. Latest 154 bpm. HVR: NSVT. Longest 00:09. Latest 182 bpm. Longevity 8.7 Years. Lead measurements: Stable. Histogram: Low (L)/normal (N)/high (H)  N. Patient activity <2 hr/day.   Lower extremity venous duplex 08/06/2018: No evidence of deep vein thrombosis of the lower extremities with normal venous return.  Lexiscan myoview stress test 08/04/2017: 1. Non-diagnostic due to paced rhythm. 2. Study quality: good. The left ventricular chamber dimensions are normal. There is medium scar of the inferolateral segment with moderate to severe residual ischemia. There is small scar of the inferior segment with very mild residual ischemia. 3. Systolic function is mildly reduced. The calculated stress EF is at 44 %. Gated SPECT imaging demonstrates hypokinesis in the inferior, lateral segment(s). 5. Intermediate risk study. Clinical correlation recommended.  Coronary angiogram 11/11/2017: LM: Normal LAD: D1 40% stneoses LCx: Tandem 50% stenoses in mid LCx RCA: Normal  Moderate coronary artery disease. She does have abnormal stress test in inferolateral territory, they are out of proportion to the coronary artery findings. Furthermore, she does not have any critical unstable lesion, to explain nonsustained ventricular tachycardia. She does not have any ongoing chest pain symptoms at this time.Recommend aggressive medical management at this time. If true anginal symptoms occur, could then perform PCI to mid circumflex.  Pacemaker Implant 07/16/2017: Medtronic Azure XT DR MRI Sure Scan (serial number [id number]) dual chamber pacemaker 07/16/2017 by [facility] for complete heart block.  Recent labs:  03/22/2020: Glucose 91, BUN/Cr 22/1.18. EGFR 51. Na/K  139/4.1. Calcium 11.7. Rest of the CMP normal H/H 10.5/31.7. MCV 85. Platelets 220 BNP 2896  02/29/2020: Chol 102, TG 52, HDL 49, LDL 52  09/2019: BUN/Cr 33/1.3 HbA1C 5.3% TSH 2.6 normal   Lab Results  Component Value Date   TSH 1.987 07/15/2017    Review of Systems  Review of Systems  Cardiovascular: Negative for chest pain, dyspnea on exertion, leg swelling, palpitations and syncope.        Objective:    Vitals:   04/10/20 1449  BP: 138/72  Pulse: 68  Resp: 15  SpO2: 99%     Physical Exam  Physical Exam  Constitutional: She appears well-developed and well-nourished.  Neck: No JVD present.  Cardiovascular: Normal rate, regular rhythm, normal heart sounds and intact distal pulses.  No murmur heard. Pulmonary/Chest: Effort normal and breath sounds normal. She has no wheezes. She has no rales.  Musculoskeletal:        General: No edema (Trace b/l).  Nursing note and vitals reviewed.        Assessment & Recommendations:   79 year old [place] American female with hypertension, dual-chamber pacemaker placement for AV  block, moderate, medically managed coronary artery disease (LCx), hypertension, NSVT, now with acute systolic heart failure  Acute systolic heart failure: New finding. Etiology includes ischemic cardiomyopathy, valvular cardiomyopathy, or other nonischemic cardiomyopathy etiologies. Her BNP and echocardiogram are more severe than her overall symptomatology. I discussed the clinical implications, and prognosis of new diagnosis of systolic heart failure.  I discussed performing right and left heart catheterization, in addition to starting a line directed medical therapy.  Patient would like to hold off heart catheterization at this time.  She would like to start medical therapy. Stop losartan, diltiazem, and potassium.  Started Entresto 49-51 mg twice daily. Continue metoprolol succinate 50 mg daily, torsemide 20 mg daily. Check BMP and NT proBNP in 1  week.  Follow-up office visit in 10-14 days. Discussed low sodium diet <2 g restriction. Arranged for remote patient monitoring for daily weight checks.   CAD: No clear anginal symptoms at this time.  However, multivessel CAD needs to be evaluated for.  See discussion above. Continue aspirin, statin, metoprolol succinate.  H/O NSVT: No recent recurrence.  Hypertension:  Controlled.  F/u in 10-14 days   Nigel Mormon, MD [facility] Pager: [phone] Office: [phone] If no answer Cell [phone]

## 2020-04-10 NOTE — Progress Notes (Signed)
Would like to see her sooner than 6/30 to discuss echocardiogram results. Sooner the better.  Thanks MJP

## 2020-04-12 ENCOUNTER — Telehealth: Payer: Self-pay

## 2020-04-12 NOTE — Telephone Encounter (Signed)
Spoke with patient's daughter Ivin Booty. Discussed recommendations for workup and management for heart failure, including heart catheterization. She will discuss further with her mother.

## 2020-04-12 NOTE — Telephone Encounter (Signed)
Pt daughter called to see if you could give her a call because pt could not explain to her what was said at the time of visit. Pt daughter call is [phone] her name is Ivin Booty

## 2020-04-13 ENCOUNTER — Ambulatory Visit: Payer: Medicare Other | Admitting: Cardiology

## 2020-04-17 DIAGNOSIS — H1045 Other chronic allergic conjunctivitis: Secondary | ICD-10-CM | POA: Diagnosis not present

## 2020-04-17 DIAGNOSIS — Z961 Presence of intraocular lens: Secondary | ICD-10-CM | POA: Diagnosis not present

## 2020-04-17 DIAGNOSIS — H04123 Dry eye syndrome of bilateral lacrimal glands: Secondary | ICD-10-CM | POA: Diagnosis not present

## 2020-04-17 DIAGNOSIS — H524 Presbyopia: Secondary | ICD-10-CM | POA: Diagnosis not present

## 2020-04-17 DIAGNOSIS — H40023 Open angle with borderline findings, high risk, bilateral: Secondary | ICD-10-CM | POA: Diagnosis not present

## 2020-04-20 DIAGNOSIS — I10 Essential (primary) hypertension: Secondary | ICD-10-CM | POA: Diagnosis not present

## 2020-04-21 ENCOUNTER — Ambulatory Visit: Payer: Medicare Other | Admitting: Cardiology

## 2020-04-21 ENCOUNTER — Encounter: Payer: Self-pay | Admitting: Cardiology

## 2020-04-21 ENCOUNTER — Other Ambulatory Visit: Payer: Self-pay

## 2020-04-21 VITALS — BP 97/54 | HR 69 | Resp 17 | Ht 59.0 in | Wt 130.0 lb

## 2020-04-21 DIAGNOSIS — I4729 Other ventricular tachycardia: Secondary | ICD-10-CM

## 2020-04-21 DIAGNOSIS — R7989 Other specified abnormal findings of blood chemistry: Secondary | ICD-10-CM

## 2020-04-21 DIAGNOSIS — I472 Ventricular tachycardia: Secondary | ICD-10-CM | POA: Diagnosis not present

## 2020-04-21 DIAGNOSIS — I10 Essential (primary) hypertension: Secondary | ICD-10-CM

## 2020-04-21 DIAGNOSIS — I5020 Unspecified systolic (congestive) heart failure: Secondary | ICD-10-CM

## 2020-04-21 DIAGNOSIS — R600 Localized edema: Secondary | ICD-10-CM

## 2020-04-21 HISTORY — DX: Unspecified systolic (congestive) heart failure: I50.20

## 2020-04-21 MED ORDER — METOPROLOL SUCCINATE ER 50 MG PO TB24: 50.0000 mg | ORAL_TABLET | Freq: Every day | ORAL | 3 refills | Status: DC

## 2020-04-21 MED ORDER — TORSEMIDE 20 MG PO TABS: ORAL_TABLET | ORAL | 1 refills | Status: DC

## 2020-04-21 MED ORDER — LOSARTAN POTASSIUM 25 MG PO TABS: 25.0000 mg | ORAL_TABLET | Freq: Every day | ORAL | 3 refills | Status: DC

## 2020-04-21 NOTE — Progress Notes (Signed)
Patient is here for follow up visit.  Subjective:   _0  ID: Holly [name], female    DOB: [birth year], 79 y.o.   MRN: [id number]   Chief Complaint  Patient presents with  . HFrEF  . Follow-up    10-14 days    HPI  79 year old African American female with hypertension, dual-chamber pacemaker placement for AV block, moderate, medically managed coronary artery disease (LCx), hypertension, NSVT, now with acute systolic heart failure  Patient was recently seen by me due to shortness of breath and elevated BNP at 2800.  Subsequent echocardiogram showed new systolic dysfunction 82-99%, with moderate mitral regurgitation, and severe left atrial dilatation.  Patient is here with her daughter today. Her breathing has improved. She has lost 5 lbs in 10 days. However, she developed rash and itching with Entresto, along with cough-similar to what she has had with lisinopril before. She denies any chest pain.  Blood pressure is lower today.   Current Outpatient Medications on File Prior to Visit  Medication Sig Dispense Refill  . ALPRAZolam (XANAX) 0.25 MG tablet Take 1 tablet by mouth as needed.    Marland Kitchen aspirin EC 81 MG tablet Take 81 mg by mouth daily.    . clobetasol ointment (TEMOVATE) 3.71 % Apply 1 application topically 2 (two) times daily as needed. For skin rash/irritation.  1  . diclofenac sodium (VOLTAREN) 1 % GEL Apply 4 g topically 4 (four) times daily. To shoulder (Patient taking differently: Apply 4 g topically daily as needed (pain). To shoulder) 1 Tube 1  . docusate sodium (COLACE) 50 MG capsule Take 50 mg by mouth as needed for mild constipation.    . EUCRISA 2 % OINT Apply 1 application topically daily.    . metoprolol succinate (TOPROL-XL) 50 MG 24 hr tablet TAKE 1 TABLET BY MOUTH TWICE A DAY 180 tablet 3  . nitroGLYCERIN (NITROSTAT) 0.4 MG SL tablet Place 1 tablet (0.4 mg total) under the tongue every 5 (five) minutes as needed for chest pain. 90 tablet 3  . olopatadine  (PATANOL) 0.1 % ophthalmic solution 1 drop 2 (two) times daily.    Marland Kitchen PATADAY 0.2 % SOLN Place 1 drop into both eyes daily as needed (for irritated eyes.).     Marland Kitchen Probiotic Product (PROBIOTIC PO) Take 1 capsule by mouth daily.     . rosuvastatin (CRESTOR) 10 MG tablet Take 10 mg by mouth daily.    . sacubitril-valsartan (ENTRESTO) 49-51 MG Take 1 tablet by mouth 2 (two) times daily. 60 tablet 1  . torsemide (DEMADEX) 20 MG tablet One tablet daily (Patient taking differently: As needed) 90 tablet 1  . triamcinolone cream (KENALOG) 0.1 % 1 APPLICATION TWICE A DAY AS NEEDED EXTERNALLY 90 DAYS     No current facility-administered medications on file prior to visit.    Cardiovascular studies:  Echocardiogram 04/04/2020:  Severely depressed LV systolic function with visual EF 25-30%. Left  ventricle cavity is normal in size. Moderate to severe left ventricular  hypertrophy. No obvious regional wall motion abnormalities. Grade II  diastolic dysfunction, elevated LAP.  Left atrial cavity is severely dilated 31m/m2. Interatrial septum bulges  to the right suggests elevated LAP. The interatrial Septum is thin and  mobile but appears to be intact by 2D and CF Doppler interrogation.  Endocardial device lead noted in the right cardiac chambers.  Mild to moderate aortic regurgitation.  Moderate (Grade II) mitral regurgitation.  Mild tricuspid regurgitation. No evidence of pulmonary hypertension. RVSP  measures 32 mmHg.  Mild pulmonic regurgitation.  Small pericardial effusion located posteriorly.  Compared to previous study dated 08/06/2018, LVEF is now reduced from 50% to  30% , now Grade 2 DD, and LA is now severely dilated.   Scheduled Remote pacemaker check  11/01/19: No AHR or VHR episodes. Health trends are normal. Battery longevity is 8.6 years. RA pacing is 15.8 %, RV pacing is 99.9 %. No further NSVT noted on 09/27/2019.   Scheduled  In office pacemaker check 09/23/19:  Single (S)/Dual  (D)/BV: D. Presenting AS/VP. Pacemaker dependant:  Yes. Underlying CHB. AP 12.3%, VP 99.7%.  AT/AF burden <0.1% 1 atrial tachycardia episode Longest 00:36 sec. Latest 154 bpm. HVR: NSVT. Longest 00:09. Latest 182 bpm. Longevity 8.7 Years. Lead measurements: Stable. Histogram: Low (L)/normal (N)/high (H)  N. Patient activity <2 hr/day.   Lower extremity venous duplex 08/06/2018: No evidence of deep vein thrombosis of the lower extremities with normal venous return.  Lexiscan myoview stress test 08/04/2017: 1. Non-diagnostic due to paced rhythm. 2. Study quality: good. The left ventricular chamber dimensions are normal. There is medium scar of the inferolateral segment with moderate to severe residual ischemia. There is small scar of the inferior segment with very mild residual ischemia. 3. Systolic function is mildly reduced. The calculated stress EF is at 44 %. Gated SPECT imaging demonstrates hypokinesis in the inferior, lateral segment(s). 5. Intermediate risk study. Clinical correlation recommended.  Coronary angiogram 11/11/2017: LM: Normal LAD: D1 40% stneoses LCx: Tandem 50% stenoses in mid LCx RCA: Normal  Moderate coronary artery disease. She does have abnormal stress test in inferolateral territory, they are out of proportion to the coronary artery findings. Furthermore, she does not have any critical unstable lesion, to explain nonsustained ventricular tachycardia. She does not have any ongoing chest pain symptoms at this time.Recommend aggressive medical management at this time. If true anginal symptoms occur, could then perform PCI to mid circumflex.  Pacemaker Implant 07/16/2017: Medtronic Azure XT DR MRI Sure Scan (serial number [id number]) dual chamber pacemaker 07/16/2017 by [facility] for complete heart block.  Recent labs: 04/06/2020: BNP 1371  03/22/2020: Glucose 91, BUN/Cr 22/1.18. EGFR 51. Na/K 139/4.1. Calcium 11.7. Rest of the CMP normal H/H 10.5/31.7. MCV 85. Platelets  220 BNP 2896  02/29/2020: Chol 102, TG 52, HDL 49, LDL 52  09/2019: BUN/Cr 33/1.3 HbA1C 5.3% TSH 2.6 normal   Lab Results  Component Value Date   TSH 1.987 07/15/2017     Review of Systems  Cardiovascular: Negative for chest pain, dyspnea on exertion, leg swelling, palpitations and syncope.        Objective:    Vitals:   04/21/20 1021  BP: (!) 97/54  Pulse: 69  Resp: 17  SpO2: 97%     Physical Exam  Physical Exam Vitals and nursing note reviewed.  Constitutional:      Appearance: She is well-developed.  Neck:     Vascular: No JVD.  Cardiovascular:     Rate and Rhythm: Normal rate and regular rhythm.     Pulses: Intact distal pulses.     Heart sounds: Normal heart sounds. No murmur heard.   Pulmonary:     Effort: Pulmonary effort is normal.     Breath sounds: Normal breath sounds. No wheezing or rales.          Assessment & Recommendations:   79 year old African American female with hypertension, dual-chamber pacemaker placement for AV block, moderate, medically managed coronary artery disease (LCx), hypertension, NSVT, now  with acute systolic heart failure  Chronic systolic heart failure: Acute systolic failure seems to be resolving, as evident by improvement in shortness of breath, reduction in weight and BNP. Stop Entresto due to rash and cough. Resume losartan at 25 mg daily, given borderline low blood pressure. Continue metoprolol succinate 50 mg daily. Take torsemide 20 mg M,W,F. She would like to hold off heart catheterization at this time. Check BMP and BNP in 1 week.  Follow-up office visit in 10-14 days. Discussed low sodium diet <2 g restriction. Arranged for remote patient monitoring for daily weight checks.   CAD: No clear anginal symptoms at this time.  However, multivessel CAD needs to be evaluated for. However, She would like to hold off heart catheterization at this time. Continue aspirin, statin, metoprolol succinate.  H/O  NSVT: No recent recurrence.  Hypertension:  Controlled.  F/u in 10-14 days   Nigel Mormon, MD [facility]. [place] Pager: [phone] Office: [phone] If no answer Cell [phone]

## 2020-04-24 DIAGNOSIS — R05 Cough: Secondary | ICD-10-CM | POA: Diagnosis not present

## 2020-04-24 DIAGNOSIS — I2510 Atherosclerotic heart disease of native coronary artery without angina pectoris: Secondary | ICD-10-CM | POA: Diagnosis not present

## 2020-04-26 DIAGNOSIS — I5020 Unspecified systolic (congestive) heart failure: Secondary | ICD-10-CM | POA: Diagnosis not present

## 2020-04-27 LAB — BASIC METABOLIC PANEL
BUN/Creatinine Ratio: 17 (ref 12–28)
BUN: 29 mg/dL — ABNORMAL HIGH (ref 8–27)
CO2: 22 mmol/L (ref 20–29)
Calcium: 12.2 mg/dL — ABNORMAL HIGH (ref 8.7–10.3)
Chloride: 95 mmol/L — ABNORMAL LOW (ref 96–106)
Creatinine, Ser: 1.75 mg/dL — ABNORMAL HIGH (ref 0.57–1.00)
GFR calc Af Amer: 31 mL/min/{1.73_m2} — ABNORMAL LOW (ref >59)
GFR calc non Af Amer: 27 mL/min/{1.73_m2} — ABNORMAL LOW (ref >59)
Glucose: 97 mg/dL (ref 65–99)
Potassium: 4 mmol/L (ref 3.5–5.2)
Sodium: 129 mmol/L — ABNORMAL LOW (ref 134–144)

## 2020-04-27 LAB — BRAIN NATRIURETIC PEPTIDE: BNP: 2647.7 pg/mL — ABNORMAL HIGH (ref 0.0–100.0)

## 2020-04-27 NOTE — Progress Notes (Signed)
called pt no answer, left a vm to call back

## 2020-04-27 NOTE — Progress Notes (Signed)
Per Blenda Bridegroom gave her results and pt understood

## 2020-04-30 DIAGNOSIS — Z45018 Encounter for adjustment and management of other part of cardiac pacemaker: Secondary | ICD-10-CM | POA: Diagnosis not present

## 2020-04-30 DIAGNOSIS — I442 Atrioventricular block, complete: Secondary | ICD-10-CM | POA: Diagnosis not present

## 2020-04-30 DIAGNOSIS — Z950 Presence of cardiac pacemaker: Secondary | ICD-10-CM | POA: Diagnosis not present

## 2020-05-02 ENCOUNTER — Telehealth: Payer: Self-pay | Admitting: Cardiology

## 2020-05-02 NOTE — Telephone Encounter (Signed)
Unscheduled (Alert) 04/30/2020:  There were 2 atrial high rate episodes detected consistent with AF. The longest lasted 00:04:20:42 in duration. There was a 0.1 % cumulative atrial arrhythmia burden. There were 6 high ventricular rate episodes detected consistent with nsVT (1-3Sec). Health trends do not demonstrate significant abnormality. Battery longevity is 8.6 years. RA pacing is 20.5 %, RV pacing is 99.8 %.     Holly Prows, MD, [facility] 05/02/2020, 9:55 PM Office: [phone]

## 2020-05-03 ENCOUNTER — Ambulatory Visit: Payer: Medicare Other | Admitting: Cardiology

## 2020-05-03 DIAGNOSIS — I5021 Acute systolic (congestive) heart failure: Secondary | ICD-10-CM | POA: Diagnosis not present

## 2020-05-03 NOTE — Telephone Encounter (Signed)
Done- pt coming in on 7/1 at 4pm for pacer check and ov with mp  -Holly [name]

## 2020-05-03 NOTE — Telephone Encounter (Signed)
Thank you. She has f/u w/me tomorrow and a pacemaker check visit today. Any way both can be done tomororw instead?  Thanks MJP

## 2020-05-04 ENCOUNTER — Ambulatory Visit: Payer: Medicare Other | Admitting: Cardiology

## 2020-05-04 ENCOUNTER — Encounter: Payer: Self-pay | Admitting: Cardiology

## 2020-05-04 VITALS — BP 134/75 | HR 76 | Resp 15 | Ht 59.0 in | Wt 133.0 lb

## 2020-05-04 DIAGNOSIS — I442 Atrioventricular block, complete: Secondary | ICD-10-CM

## 2020-05-04 DIAGNOSIS — I5020 Unspecified systolic (congestive) heart failure: Secondary | ICD-10-CM | POA: Diagnosis not present

## 2020-05-04 DIAGNOSIS — I25118 Atherosclerotic heart disease of native coronary artery with other forms of angina pectoris: Secondary | ICD-10-CM

## 2020-05-04 DIAGNOSIS — I480 Paroxysmal atrial fibrillation: Secondary | ICD-10-CM | POA: Insufficient documentation

## 2020-05-04 DIAGNOSIS — Z45018 Encounter for adjustment and management of other part of cardiac pacemaker: Secondary | ICD-10-CM | POA: Diagnosis not present

## 2020-05-04 DIAGNOSIS — I472 Ventricular tachycardia: Secondary | ICD-10-CM | POA: Diagnosis not present

## 2020-05-04 DIAGNOSIS — I4729 Other ventricular tachycardia: Secondary | ICD-10-CM

## 2020-05-04 NOTE — Progress Notes (Signed)
Patient is here for follow up visit.  Subjective:   _0  ID: Holly [name], female    DOB: 11-25-1940, 79 y.o.   MRN: [id number]   Chief Complaint  Patient presents with   Congestive Heart Failure   Follow-up    Pacer Ck    HPI  79 year old African American female with hypertension, dual-chamber pacemaker placement for AV block, moderate, medically managed coronary artery disease (LCx), hypertension, NSVT, now with acute systolic heart failure  Patient did not tolerate Entresto due to rash.  She is back on her baseline medications of losartan and metoprolol succinate. While BNP has increased recently, which is remained stable.  Her Delene Loll was stopped on 04/21/2020.  She does endorse retrosternal burning sensation from time to time.  Recent pacemaker showed at least one episode of atrial fibrillation, and episodes of nonsustained VT.  Last week, I will stop her torsemide due to increasing creatinine.  She did endorse low blood pressure and lightheadedness 2 weeks ago, which has since resolved.  Now, she does have bilateral leg swelling.  Current Outpatient Medications on File Prior to Visit  Medication Sig Dispense Refill   ALPRAZolam (XANAX) 0.25 MG tablet Take 1 tablet by mouth as needed.     aspirin EC 81 MG tablet Take 81 mg by mouth daily.     clobetasol ointment (TEMOVATE) 4.43 % Apply 1 application topically 2 (two) times daily as needed. For skin rash/irritation.  1   diclofenac sodium (VOLTAREN) 1 % GEL Apply 4 g topically 4 (four) times daily. To shoulder (Patient taking differently: Apply 4 g topically daily as needed (pain). To shoulder) 1 Tube 1   docusate sodium (COLACE) 50 MG capsule Take 50 mg by mouth as needed for mild constipation.     EUCRISA 2 % OINT Apply 1 application topically daily.     losartan (COZAAR) 25 MG tablet Take 1 tablet (25 mg total) by mouth daily. 30 tablet 3   metoprolol succinate (TOPROL-XL) 50 MG 24 hr tablet Take 1 tablet (50  mg total) by mouth daily. Take with or immediately following a meal. 90 tablet 3   olopatadine (PATANOL) 0.1 % ophthalmic solution 1 drop 2 (two) times daily.     PATADAY 0.2 % SOLN Place 1 drop into both eyes daily as needed (for irritated eyes.).      Probiotic Product (PROBIOTIC PO) Take 1 capsule by mouth daily.      rosuvastatin (CRESTOR) 10 MG tablet Take 10 mg by mouth daily.     torsemide (DEMADEX) 20 MG tablet Monday, Wednesday, Friday 30 tablet 1   triamcinolone cream (KENALOG) 0.1 % 1 APPLICATION TWICE A DAY AS NEEDED EXTERNALLY 90 DAYS     nitroGLYCERIN (NITROSTAT) 0.4 MG SL tablet Place 1 tablet (0.4 mg total) under the tongue every 5 (five) minutes as needed for chest pain. 90 tablet 3   No current facility-administered medications on file prior to visit.    Cardiovascular studies:  Scheduled  In office pacemaker check 05/04/20  Single (S)/Dual (D)/BV: D. Presenting A sensed V paced 74 bpm. Pacemaker dependant:  Yes. Underlying V paced at 30 bpm AP 19.8%, VP 99.8%. AMS Episodes 2.  AT/AF burden N/A% . Longest 4 hrs on 6/15 Afib. HVR 6. Longest 13 beat NSVT. Longevity 8.6 Years. Magnet rate: >85%. Lead measurements: Stable. Thoracic impedance: N/A. Histogram: Low (L)/normal (N)/high (H)  Low. No rate response on. Patient activity <1 hr/day.   Observations: As above. Changes: Rate response  Echocardiogram 04/04/2020:  Severely depressed LV systolic function with visual EF 25-30%. Left  ventricle cavity is normal in size. Moderate to severe left ventricular  hypertrophy. No obvious regional wall motion abnormalities. Grade II  diastolic dysfunction, elevated LAP.  Left atrial cavity is severely dilated 55m/m2. Interatrial septum bulges  to the right suggests elevated LAP. The interatrial Septum is thin and  mobile but appears to be intact by 2D and CF Doppler interrogation.  Endocardial device lead noted in the right cardiac chambers.  Mild to moderate aortic  regurgitation.  Moderate (Grade II) mitral regurgitation.  Mild tricuspid regurgitation. No evidence of pulmonary hypertension. RVSP  measures 32 mmHg.  Mild pulmonic regurgitation.  Small pericardial effusion located posteriorly.  Compared to previous study dated 08/06/2018, LVEF is now reduced from 50% to  30% , now Grade 2 DD, and LA is now severely dilated.   Scheduled Remote pacemaker check  11/01/19: No AHR or VHR episodes. Health trends are normal. Battery longevity is 8.6 years. RA pacing is 15.8 %, RV pacing is 99.9 %. No further NSVT noted on 09/27/2019.   Scheduled  In office pacemaker check 09/23/19:  Single (S)/Dual (D)/BV: D. Presenting AS/VP. Pacemaker dependant:  Yes. Underlying CHB. AP 12.3%, VP 99.7%.  AT/AF burden <0.1% 1 atrial tachycardia episode Longest 00:36 sec. Latest 154 bpm. HVR: NSVT. Longest 00:09. Latest 182 bpm. Longevity 8.7 Years. Lead measurements: Stable. Histogram: Low (L)/normal (N)/high (H)  N. Patient activity <2 hr/day.   Lower extremity venous duplex 08/06/2018: No evidence of deep vein thrombosis of the lower extremities with normal venous return.  Lexiscan myoview stress test 08/04/2017: 1. Non-diagnostic due to paced rhythm. 2. Study quality: good. The left ventricular chamber dimensions are normal. There is medium scar of the inferolateral segment with moderate to severe residual ischemia. There is small scar of the inferior segment with very mild residual ischemia. 3. Systolic function is mildly reduced. The calculated stress EF is at 44 %. Gated SPECT imaging demonstrates hypokinesis in the inferior, lateral segment(s). 5. Intermediate risk study. Clinical correlation recommended.  Coronary angiogram 11/11/2017: LM: Normal LAD: D1 40% stneoses LCx: Tandem 50% stenoses in mid LCx RCA: Normal  Moderate coronary artery disease. She does have abnormal stress test in inferolateral territory, they are out of proportion to the coronary artery  findings. Furthermore, she does not have any critical unstable lesion, to explain nonsustained ventricular tachycardia. She does not have any ongoing chest pain symptoms at this time.Recommend aggressive medical management at this time. If true anginal symptoms occur, could then perform PCI to mid circumflex.  Pacemaker Implant 07/16/2017: Medtronic Azure XT DR MRI Sure Scan (serial number [id number]) [facility]  Recent labs: 04/26/2020: Glucose 97, BUN/Cr 29/1.75. EGFR 31. Na/K 129/4.0.  BNP 2647 (1271 on 04/06/2020)  04/06/2020: BNP 1371  03/22/2020: Glucose 91, BUN/Cr 22/1.18. EGFR 51. Na/K 139/4.1. Calcium 11.7. Rest of the CMP normal H/H 10.5/31.7. MCV 85. Platelets 220 BNP 2896  02/29/2020: Chol 102, TG 52, HDL 49, LDL 52  09/2019: BUN/Cr 33/1.3 HbA1C 5.3% TSH 2.6 normal   Lab Results  Component Value Date   TSH 1.987 07/15/2017     Review of Systems  Cardiovascular: Negative for chest pain, dyspnea on exertion, leg swelling, palpitations and syncope.        Objective:    Vitals:   05/04/20 1557  BP: 134/75  Pulse: 76  Resp: 15  SpO2: 99%  Physical Exam  Physical Exam Vitals and nursing note reviewed.  Constitutional:      Appearance: She is well-developed.  Neck:     Vascular: No JVD.  Cardiovascular:     Rate and Rhythm: Normal rate and regular rhythm.     Pulses: Intact distal pulses.     Heart sounds: Normal heart sounds. No murmur heard.   Pulmonary:     Effort: Pulmonary effort is normal.     Breath sounds: Normal breath sounds. No wheezing or rales.          Assessment & Recommendations:   79 year old [place] American female with hypertension, dual-chamber pacemaker placement for AV block, moderate, medically managed coronary artery disease (LCx), hypertension, NSVT, now with acute systolic heart failure  Acute systolic heart failure: Resume torsemide at 20 mg every other  day.  Check BMP and NT proBNP today. Elevated BNP could be secondary to Hyder use.  This is now stopped owing to skin rash. Continue losartan 25 mg daily, metoprolol succinate 50 mg daily. Etiology of heart failure remains unclear.  Differentials include coronary artery disease, paroxysmal atrial fibrillation. Patient has opted to hold off cardiac catheterization at this time.  With her recently increased creatinine, we may have to delay this anyway.  If obstructive CAD is excluded, she may benefit from cardiac resynchronization therapy by adding a coronary sinus lead. Patient is unsure if she would like to pursue the above invasive management options at this time.  She would like to continue current medical therapy. She will need close follow-up.  I will check BMP and NT proBNP today and see her for follow-up next week.  Paroxysmal atrial fibrillation: New diagnosis.  CHA2DS2-VASc score 4, annual stroke risk 5%. Recommend Eliquis 2.5 mg twice daily.  We will stop aspirin at this time to avoid bleeding risk, unless significant CAD is identified on physical angiogram. I called patient to convey these recommendations after today's visit and left voicemail.  Will follow up at visit next week.   CAD: Continue statin, metoprolol succinate.  NSVT: Continue heart failure management as above  Hypertension:  Controlled.  Scheduled  In office pacemaker check 05/04/20  Single (S)/Dual (D)/BV: D. Presenting A sensed V paced 74 bpm. Pacemaker dependant:  Yes. Underlying V paced at 30 bpm AP 19.8%, VP 99.8%. AMS Episodes 2.  AT/AF burden N/A% . Longest 4 hrs on 6/15 Afib. HVR 6. Longest 13 beat NSVT. Longevity 8.6 Years. Magnet rate: >85%. Lead measurements: Stable. Thoracic impedance: N/A. Histogram: Low (L)/normal (N)/high (H)  Low. No rate response on. Patient activity <1 hr/day.   Observations: As above. Changes: Rate response  F/u next week  Nigel Mormon, MD [facility]  Cardiovascular. [place] Pager: [phone] Office: [phone] If no answer Cell [phone]

## 2020-05-09 ENCOUNTER — Encounter: Payer: Self-pay | Admitting: Cardiology

## 2020-05-09 ENCOUNTER — Ambulatory Visit: Payer: Medicare Other | Admitting: Cardiology

## 2020-05-09 ENCOUNTER — Other Ambulatory Visit: Payer: Self-pay

## 2020-05-09 VITALS — BP 128/75 | HR 75 | Ht 59.0 in | Wt 127.6 lb

## 2020-05-09 DIAGNOSIS — I480 Paroxysmal atrial fibrillation: Secondary | ICD-10-CM

## 2020-05-09 DIAGNOSIS — I5020 Unspecified systolic (congestive) heart failure: Secondary | ICD-10-CM

## 2020-05-09 DIAGNOSIS — I4729 Other ventricular tachycardia: Secondary | ICD-10-CM

## 2020-05-09 DIAGNOSIS — I472 Ventricular tachycardia: Secondary | ICD-10-CM | POA: Diagnosis not present

## 2020-05-09 MED ORDER — APIXABAN 2.5 MG PO TABS: 2.5000 mg | ORAL_TABLET | Freq: Two times a day (BID) | ORAL | 2 refills | Status: DC

## 2020-05-09 NOTE — Progress Notes (Signed)
Patient is here for follow up visit.  Subjective:   _0  ID: Haze Rushing, female    DOB: Dec 13, 1940, 79 y.o.   MRN: [id number]   Chief Complaint  Patient presents with  . Congestive Heart Failure  . Follow-up    lab results    HPI  79 year old African American female with hypertension, dual-chamber pacemaker placement for AV block, moderate, medically managed coronary artery disease (LCx), hypertension, NSVT, now with acute systolic heart failure  Patient did not tolerate Entresto due to rash.  She is back on her baseline medications of losartan and metoprolol succinate. While BNP has increased recently, which is remained stable.  Her Delene Loll was stopped on 04/21/2020.  She does endorse retrosternal burning sensation from time to time. Pacemaker interrogation in 04/2020 showed at least one episode of atrial fibrillation, and episodes of nonsustained VT.  At her visit last week, I resumed her torsemide 20 mg every other day.  She has lost 6 pounds in last few days.  This morning, she felt nauseous and shaky, but has improved since then.  She did not have any vomiting episode.  She continues to have retrosternal burning sensation from time to time.  Swelling has improved.  Current Outpatient Medications on File Prior to Visit  Medication Sig Dispense Refill  . ALPRAZolam (XANAX) 0.25 MG tablet Take 1 tablet by mouth as needed.    Marland Kitchen aspirin EC 81 MG tablet Take 81 mg by mouth daily.    . clobetasol ointment (TEMOVATE) 7.37 % Apply 1 application topically 2 (two) times daily as needed. For skin rash/irritation.  1  . diclofenac sodium (VOLTAREN) 1 % GEL Apply 4 g topically 4 (four) times daily. To shoulder (Patient taking differently: Apply 4 g topically daily as needed (pain). To shoulder) 1 Tube 1  . docusate sodium (COLACE) 50 MG capsule Take 50 mg by mouth as needed for mild constipation.    . EUCRISA 2 % OINT Apply 1 application topically daily.    Marland Kitchen losartan (COZAAR) 25 MG  tablet Take 1 tablet (25 mg total) by mouth daily. 30 tablet 3  . metoprolol succinate (TOPROL-XL) 50 MG 24 hr tablet Take 1 tablet (50 mg total) by mouth daily. Take with or immediately following a meal. 90 tablet 3  . nitroGLYCERIN (NITROSTAT) 0.4 MG SL tablet Place 1 tablet (0.4 mg total) under the tongue every 5 (five) minutes as needed for chest pain. 90 tablet 3  . olopatadine (PATANOL) 0.1 % ophthalmic solution 1 drop 2 (two) times daily.    Marland Kitchen PATADAY 0.2 % SOLN Place 1 drop into both eyes daily as needed (for irritated eyes.).     Marland Kitchen Probiotic Product (PROBIOTIC PO) Take 1 capsule by mouth daily.     . rosuvastatin (CRESTOR) 10 MG tablet Take 10 mg by mouth daily.    Marland Kitchen torsemide (DEMADEX) 20 MG tablet Monday, Wednesday, Friday 30 tablet 1  . triamcinolone cream (KENALOG) 0.1 % 1 APPLICATION TWICE A DAY AS NEEDED EXTERNALLY 90 DAYS     No current facility-administered medications on file prior to visit.    Cardiovascular studies:  Scheduled  In office pacemaker check 05/09/20  Single (S)/Dual (D)/BV: D. Presenting A sensed V paced 74 bpm. Pacemaker dependant:  Yes. Underlying V paced at 30 bpm AP 19.8%, VP 99.8%. AMS Episodes 2.  AT/AF burden N/A% . Longest 4 hrs on 6/15 Afib. HVR 6. Longest 13 beat NSVT. Longevity 8.6 Years. Magnet rate: >85%. Lead measurements:  Stable. Thoracic impedance: N/A. Histogram: Low (L)/normal (N)/high (H)  Low. No rate response on. Patient activity <1 hr/day.   Observations: As above. Changes: Rate response   Echocardiogram 04/04/2020:  Severely depressed LV systolic function with visual EF 25-30%. Left  ventricle cavity is normal in size. Moderate to severe left ventricular  hypertrophy. No obvious regional wall motion abnormalities. Grade II  diastolic dysfunction, elevated LAP.  Left atrial cavity is severely dilated 69m/m2. Interatrial septum bulges  to the right suggests elevated LAP. The interatrial Septum is thin and  mobile but appears  to be intact by 2D and CF Doppler interrogation.  Endocardial device lead noted in the right cardiac chambers.  Mild to moderate aortic regurgitation.  Moderate (Grade II) mitral regurgitation.  Mild tricuspid regurgitation. No evidence of pulmonary hypertension. RVSP  measures 32 mmHg.  Mild pulmonic regurgitation.  Small pericardial effusion located posteriorly.  Compared to previous study dated 08/06/2018, LVEF is now reduced from 50% to  30% , now Grade 2 DD, and LA is now severely dilated.   Scheduled Remote pacemaker check  11/01/19: No AHR or VHR episodes. Health trends are normal. Battery longevity is 8.6 years. RA pacing is 15.8 %, RV pacing is 99.9 %. No further NSVT noted on 09/27/2019.   Scheduled  In office pacemaker check 09/23/19:  Single (S)/Dual (D)/BV: D. Presenting AS/VP. Pacemaker dependant:  Yes. Underlying CHB. AP 12.3%, VP 99.7%.  AT/AF burden <0.1% 1 atrial tachycardia episode Longest 00:36 sec. Latest 154 bpm. HVR: NSVT. Longest 00:09. Latest 182 bpm. Longevity 8.7 Years. Lead measurements: Stable. Histogram: Low (L)/normal (N)/high (H)  N. Patient activity <2 hr/day.   Lower extremity venous duplex 08/06/2018: No evidence of deep vein thrombosis of the lower extremities with normal venous return.  Lexiscan myoview stress test 08/04/2017: 1. Non-diagnostic due to paced rhythm. 2. Study quality: good. The left ventricular chamber dimensions are normal. There is medium scar of the inferolateral segment with moderate to severe residual ischemia. There is small scar of the inferior segment with very mild residual ischemia. 3. Systolic function is mildly reduced. The calculated stress EF is at 44 %. Gated SPECT imaging demonstrates hypokinesis in the inferior, lateral segment(s). 5. Intermediate risk study. Clinical correlation recommended.  Coronary angiogram 11/11/2017: LM: Normal LAD: D1 40% stneoses LCx: Tandem 50% stenoses in mid LCx RCA: Normal  Moderate  coronary artery disease. She does have abnormal stress test in inferolateral territory, they are out of proportion to the coronary artery findings. Furthermore, she does not have any critical unstable lesion, to explain nonsustained ventricular tachycardia. She does not have any ongoing chest pain symptoms at this time.Recommend aggressive medical management at this time. If true anginal symptoms occur, could then perform PCI to mid circumflex.  Pacemaker Implant 07/16/2017: Medtronic Azure XT DR MRI Sure Scan (serial number [id number]) dual chamber pacemaker 07[facility]16/2017 [facility].  Recent labs: 04/26/2020: Glucose 97, BUN/Cr 29/1.75. EGFR 31. Na/K 129/4.0.  BNP 2647 (1271 on 04/06/2020)  04/06/2020: BNP 1371  03/22/2020: Glucose 91, BUN/Cr 22/1.18. EGFR 51. Na/K 139/4.1. Calcium 11.7. Rest of the CMP normal H/H 10.5/31.7. MCV 85. Platelets 220 BNP 2896  02/29/2020: Chol 102, TG 52, HDL 49, LDL 52  09/2019: BUN/Cr 33/1.3 HbA1C 5.3% TSH 2.6 normal   Lab Results  Component Value Date   TSH 1.987 07/15/2017     Review of Systems  Cardiovascular: Negative for chest pain, dyspnea on exertion, leg swelling, palpitations and syncope.  Objective:    Vitals:   05/09/20 0946  BP: (!) 147/85  Pulse: 79  SpO2: 98%     Physical Exam  Physical Exam Vitals and nursing note reviewed.  Constitutional:      Appearance: She is well-developed.  Neck:     Vascular: No JVD.  Cardiovascular:     Rate and Rhythm: Normal rate and regular rhythm.     Pulses: Intact distal pulses.     Heart sounds: Normal heart sounds. No murmur heard.   Pulmonary:     Effort: Pulmonary effort is normal.     Breath sounds: Normal breath sounds. No wheezing or rales.  Musculoskeletal:     Right lower leg: Edema (Trace) present.     Left lower leg: Edema (Trace) present.          Assessment & Recommendations:   79 year old [place] American female with hypertension,  dual-chamber pacemaker placement for AV block, moderate, medically managed coronary artery disease (LCx), hypertension, NSVT, now with acute systolic heart failure  Acute systolic heart failure: Continue torsemide at 20 mg every other day.  Check BMP and NT proBNP today, and repeat BMP on 7/16. Elevated BNP could be secondary to [place] use.  This is now stopped owing to skin rash. Continue losartan 25 mg daily, metoprolol succinate 50 mg daily. Etiology of heart failure remains unclear.  Differentials include coronary artery disease, paroxysmal atrial fibrillation. If renal function is favorable, plan on performing right and left heart catheterization on 7/22 to evaluate for and treat obstructive CAD, if present. Risks, benefits, alternate options discussed with the patient and daughter. Patient knows to hold losartan on morning of 720, and Eliquis on 719 and 720. If obstructive CAD is excluded, she may benefit from cardiac resynchronization therapy by adding a coronary sinus lead.  Paroxysmal atrial fibrillation: New diagnosis.  CHA2DS2-VASc score 4, annual stroke risk 5%. Recommend Eliquis 2.5 mg twice daily.  We will stop aspirin at this time to avoid bleeding risk, unless significant CAD is identified on coronary angiogram.  CAD: Continue statin, metoprolol succinate.  NSVT: Continue heart failure management as above  Hypertension:  Controlled.  Office follow-up in 3 weeks  [place] Einstein Esther Hardy, MD [facility] [place] Pager: [phone] Office: [phone] If no answer Cell [phone]

## 2020-05-10 ENCOUNTER — Telehealth: Payer: Self-pay

## 2020-05-10 LAB — BASIC METABOLIC PANEL
BUN/Creatinine Ratio: 22 (ref 12–28)
BUN: 30 mg/dL — ABNORMAL HIGH (ref 8–27)
CO2: 19 mmol/L — ABNORMAL LOW (ref 20–29)
Calcium: 11.7 mg/dL — ABNORMAL HIGH (ref 8.7–10.3)
Chloride: 90 mmol/L — ABNORMAL LOW (ref 96–106)
Creatinine, Ser: 1.37 mg/dL — ABNORMAL HIGH (ref 0.57–1.00)
GFR calc Af Amer: 42 mL/min/{1.73_m2} — ABNORMAL LOW (ref >59)
GFR calc non Af Amer: 37 mL/min/{1.73_m2} — ABNORMAL LOW (ref >59)
Glucose: 84 mg/dL (ref 65–99)
Potassium: 4.7 mmol/L (ref 3.5–5.2)
Sodium: 122 mmol/L — ABNORMAL LOW (ref 134–144)

## 2020-05-10 NOTE — Telephone Encounter (Signed)
Called pt about her lab results, but  no answer left a vm

## 2020-05-10 NOTE — Telephone Encounter (Signed)
-----   Message from [facility], MD sent at 05/10/2020  8:36 AM EDT ----- Kidney function has improved. Continue torsemide M, W, F. Will do the heart catheterization as scheduled.  Thanks MJP

## 2020-05-11 NOTE — Progress Notes (Signed)
Called and spoke with patient regarding her lab results. Patient is aware to continue Torsemide M, W, F.

## 2020-05-15 ENCOUNTER — Telehealth: Payer: Self-pay

## 2020-05-15 NOTE — Telephone Encounter (Signed)
Cath cx'd. Pt aware of appt.

## 2020-05-15 NOTE — Telephone Encounter (Signed)
Ok. Keep appt with me as scheduled.

## 2020-05-15 NOTE — Telephone Encounter (Signed)
FYI: Pt declined to have procedure at this time. Will call back to r/s she wants to have it done.

## 2020-05-16 DIAGNOSIS — G629 Polyneuropathy, unspecified: Secondary | ICD-10-CM | POA: Diagnosis not present

## 2020-05-16 DIAGNOSIS — E782 Mixed hyperlipidemia: Secondary | ICD-10-CM | POA: Diagnosis not present

## 2020-05-16 DIAGNOSIS — M479 Spondylosis, unspecified: Secondary | ICD-10-CM | POA: Diagnosis not present

## 2020-05-16 DIAGNOSIS — G5603 Carpal tunnel syndrome, bilateral upper limbs: Secondary | ICD-10-CM | POA: Diagnosis not present

## 2020-05-16 DIAGNOSIS — I2510 Atherosclerotic heart disease of native coronary artery without angina pectoris: Secondary | ICD-10-CM | POA: Diagnosis not present

## 2020-05-16 DIAGNOSIS — I10 Essential (primary) hypertension: Secondary | ICD-10-CM | POA: Diagnosis not present

## 2020-05-16 DIAGNOSIS — M1610 Unilateral primary osteoarthritis, unspecified hip: Secondary | ICD-10-CM | POA: Diagnosis not present

## 2020-05-16 DIAGNOSIS — Z7982 Long term (current) use of aspirin: Secondary | ICD-10-CM | POA: Diagnosis not present

## 2020-05-16 DIAGNOSIS — J45909 Unspecified asthma, uncomplicated: Secondary | ICD-10-CM | POA: Diagnosis not present

## 2020-05-16 DIAGNOSIS — Z7901 Long term (current) use of anticoagulants: Secondary | ICD-10-CM | POA: Diagnosis not present

## 2020-05-16 DIAGNOSIS — Q652 Congenital dislocation of hip, unspecified: Secondary | ICD-10-CM | POA: Diagnosis not present

## 2020-05-19 DIAGNOSIS — I5020 Unspecified systolic (congestive) heart failure: Secondary | ICD-10-CM | POA: Diagnosis not present

## 2020-05-20 LAB — BASIC METABOLIC PANEL
BUN/Creatinine Ratio: 15 (ref 12–28)
BUN: 18 mg/dL (ref 8–27)
CO2: 17 mmol/L — ABNORMAL LOW (ref 20–29)
Calcium: 12.1 mg/dL — ABNORMAL HIGH (ref 8.7–10.3)
Chloride: 95 mmol/L — ABNORMAL LOW (ref 96–106)
Creatinine, Ser: 1.21 mg/dL — ABNORMAL HIGH (ref 0.57–1.00)
GFR calc Af Amer: 49 mL/min/{1.73_m2} — ABNORMAL LOW (ref >59)
GFR calc non Af Amer: 42 mL/min/{1.73_m2} — ABNORMAL LOW (ref >59)
Glucose: 99 mg/dL (ref 65–99)
Potassium: 4.7 mmol/L (ref 3.5–5.2)
Sodium: 130 mmol/L — ABNORMAL LOW (ref 134–144)

## 2020-05-22 DIAGNOSIS — J45909 Unspecified asthma, uncomplicated: Secondary | ICD-10-CM | POA: Diagnosis not present

## 2020-05-22 DIAGNOSIS — Q652 Congenital dislocation of hip, unspecified: Secondary | ICD-10-CM | POA: Diagnosis not present

## 2020-05-22 DIAGNOSIS — I2510 Atherosclerotic heart disease of native coronary artery without angina pectoris: Secondary | ICD-10-CM | POA: Diagnosis not present

## 2020-05-22 DIAGNOSIS — Z7982 Long term (current) use of aspirin: Secondary | ICD-10-CM | POA: Diagnosis not present

## 2020-05-22 DIAGNOSIS — I10 Essential (primary) hypertension: Secondary | ICD-10-CM | POA: Diagnosis not present

## 2020-05-22 DIAGNOSIS — M479 Spondylosis, unspecified: Secondary | ICD-10-CM | POA: Diagnosis not present

## 2020-05-22 DIAGNOSIS — G5603 Carpal tunnel syndrome, bilateral upper limbs: Secondary | ICD-10-CM | POA: Diagnosis not present

## 2020-05-22 DIAGNOSIS — M1610 Unilateral primary osteoarthritis, unspecified hip: Secondary | ICD-10-CM | POA: Diagnosis not present

## 2020-05-22 DIAGNOSIS — G629 Polyneuropathy, unspecified: Secondary | ICD-10-CM | POA: Diagnosis not present

## 2020-05-22 DIAGNOSIS — E782 Mixed hyperlipidemia: Secondary | ICD-10-CM | POA: Diagnosis not present

## 2020-05-22 DIAGNOSIS — Z7901 Long term (current) use of anticoagulants: Secondary | ICD-10-CM | POA: Diagnosis not present

## 2020-05-23 ENCOUNTER — Encounter (HOSPITAL_COMMUNITY): Payer: Self-pay

## 2020-05-23 ENCOUNTER — Telehealth: Payer: Self-pay

## 2020-05-23 ENCOUNTER — Ambulatory Visit (HOSPITAL_COMMUNITY): Admit: 2020-05-23 | Payer: Medicare Other | Admitting: Cardiology

## 2020-05-23 SURGERY — RIGHT/LEFT HEART CATH AND CORONARY ANGIOGRAPHY
Anesthesia: LOCAL

## 2020-05-23 NOTE — Telephone Encounter (Signed)
-----   Message from Nigel Mormon, MD sent at 05/20/2020 10:19 AM EDT ----- Kidney function much improved.  Hope you had a nice birthday.  Thanks MJP

## 2020-05-23 NOTE — Telephone Encounter (Signed)
Called pt to inform her about her lab results. Pt understood

## 2020-05-24 DIAGNOSIS — I10 Essential (primary) hypertension: Secondary | ICD-10-CM | POA: Diagnosis not present

## 2020-05-24 DIAGNOSIS — Z7901 Long term (current) use of anticoagulants: Secondary | ICD-10-CM | POA: Diagnosis not present

## 2020-05-24 DIAGNOSIS — G5603 Carpal tunnel syndrome, bilateral upper limbs: Secondary | ICD-10-CM | POA: Diagnosis not present

## 2020-05-24 DIAGNOSIS — G629 Polyneuropathy, unspecified: Secondary | ICD-10-CM | POA: Diagnosis not present

## 2020-05-24 DIAGNOSIS — I2510 Atherosclerotic heart disease of native coronary artery without angina pectoris: Secondary | ICD-10-CM | POA: Diagnosis not present

## 2020-05-24 DIAGNOSIS — Q652 Congenital dislocation of hip, unspecified: Secondary | ICD-10-CM | POA: Diagnosis not present

## 2020-05-24 DIAGNOSIS — M479 Spondylosis, unspecified: Secondary | ICD-10-CM | POA: Diagnosis not present

## 2020-05-24 DIAGNOSIS — J45909 Unspecified asthma, uncomplicated: Secondary | ICD-10-CM | POA: Diagnosis not present

## 2020-05-24 DIAGNOSIS — Z7982 Long term (current) use of aspirin: Secondary | ICD-10-CM | POA: Diagnosis not present

## 2020-05-24 DIAGNOSIS — M1610 Unilateral primary osteoarthritis, unspecified hip: Secondary | ICD-10-CM | POA: Diagnosis not present

## 2020-05-24 DIAGNOSIS — E782 Mixed hyperlipidemia: Secondary | ICD-10-CM | POA: Diagnosis not present

## 2020-05-29 DIAGNOSIS — Z7982 Long term (current) use of aspirin: Secondary | ICD-10-CM | POA: Diagnosis not present

## 2020-05-29 DIAGNOSIS — Z7901 Long term (current) use of anticoagulants: Secondary | ICD-10-CM | POA: Diagnosis not present

## 2020-05-29 DIAGNOSIS — I2510 Atherosclerotic heart disease of native coronary artery without angina pectoris: Secondary | ICD-10-CM | POA: Diagnosis not present

## 2020-05-29 DIAGNOSIS — M1610 Unilateral primary osteoarthritis, unspecified hip: Secondary | ICD-10-CM | POA: Diagnosis not present

## 2020-05-29 DIAGNOSIS — G629 Polyneuropathy, unspecified: Secondary | ICD-10-CM | POA: Diagnosis not present

## 2020-05-29 DIAGNOSIS — E782 Mixed hyperlipidemia: Secondary | ICD-10-CM | POA: Diagnosis not present

## 2020-05-29 DIAGNOSIS — M479 Spondylosis, unspecified: Secondary | ICD-10-CM | POA: Diagnosis not present

## 2020-05-29 DIAGNOSIS — I10 Essential (primary) hypertension: Secondary | ICD-10-CM | POA: Diagnosis not present

## 2020-05-29 DIAGNOSIS — Q652 Congenital dislocation of hip, unspecified: Secondary | ICD-10-CM | POA: Diagnosis not present

## 2020-05-29 DIAGNOSIS — G5603 Carpal tunnel syndrome, bilateral upper limbs: Secondary | ICD-10-CM | POA: Diagnosis not present

## 2020-05-29 DIAGNOSIS — J45909 Unspecified asthma, uncomplicated: Secondary | ICD-10-CM | POA: Diagnosis not present

## 2020-05-31 DIAGNOSIS — Z7901 Long term (current) use of anticoagulants: Secondary | ICD-10-CM | POA: Diagnosis not present

## 2020-05-31 DIAGNOSIS — J45909 Unspecified asthma, uncomplicated: Secondary | ICD-10-CM | POA: Diagnosis not present

## 2020-05-31 DIAGNOSIS — M1610 Unilateral primary osteoarthritis, unspecified hip: Secondary | ICD-10-CM | POA: Diagnosis not present

## 2020-05-31 DIAGNOSIS — Q652 Congenital dislocation of hip, unspecified: Secondary | ICD-10-CM | POA: Diagnosis not present

## 2020-05-31 DIAGNOSIS — I10 Essential (primary) hypertension: Secondary | ICD-10-CM | POA: Diagnosis not present

## 2020-05-31 DIAGNOSIS — G5603 Carpal tunnel syndrome, bilateral upper limbs: Secondary | ICD-10-CM | POA: Diagnosis not present

## 2020-05-31 DIAGNOSIS — Z7982 Long term (current) use of aspirin: Secondary | ICD-10-CM | POA: Diagnosis not present

## 2020-05-31 DIAGNOSIS — E782 Mixed hyperlipidemia: Secondary | ICD-10-CM | POA: Diagnosis not present

## 2020-05-31 DIAGNOSIS — M479 Spondylosis, unspecified: Secondary | ICD-10-CM | POA: Diagnosis not present

## 2020-05-31 DIAGNOSIS — I2510 Atherosclerotic heart disease of native coronary artery without angina pectoris: Secondary | ICD-10-CM | POA: Diagnosis not present

## 2020-05-31 DIAGNOSIS — G629 Polyneuropathy, unspecified: Secondary | ICD-10-CM | POA: Diagnosis not present

## 2020-06-01 DIAGNOSIS — M479 Spondylosis, unspecified: Secondary | ICD-10-CM | POA: Diagnosis not present

## 2020-06-01 DIAGNOSIS — G629 Polyneuropathy, unspecified: Secondary | ICD-10-CM | POA: Diagnosis not present

## 2020-06-01 DIAGNOSIS — M1610 Unilateral primary osteoarthritis, unspecified hip: Secondary | ICD-10-CM | POA: Diagnosis not present

## 2020-06-03 DIAGNOSIS — I5021 Acute systolic (congestive) heart failure: Secondary | ICD-10-CM | POA: Diagnosis not present

## 2020-06-04 NOTE — Progress Notes (Signed)
Patient is here for follow up visit.  Subjective:   _0  ID: Holly [name], female    DOB: [birth year], 79 y.o.   MRN: [id number]   Chief Complaint  Patient presents with  . Atrial Fibrillation  . Follow-up    HPI  79 year old African American female with hypertension, dual-chamber pacemaker placement for AV block, moderate, medically managed coronary artery disease (LCx), hypertension, NSVT, now with acute systolic heart failure  Patient did not tolerate Entresto due to rash.  She is back on her baseline medications of losartan and metoprolol succinate. While BNP has increased recently, which is remained stable.  Her Delene Loll was stopped on 04/21/2020.  She does endorse retrosternal burning sensation from time to time. Pacemaker interrogation in 04/2020 showed at least one episode of atrial fibrillation, and episodes of nonsustained VT.  Patient recently celebrated her birthday recently. She is feeling well. Breathing has improved. Leg edema has resolved. She denies any angina symptoms at this time.   Current Outpatient Medications on File Prior to Visit  Medication Sig Dispense Refill  . apixaban (ELIQUIS) 2.5 MG TABS tablet Take 1 tablet (2.5 mg total) by mouth 2 (two) times daily. 60 tablet 2  . aspirin EC 81 MG tablet Take 81 mg by mouth daily. Swallow whole.    . bisacodyl (DULCOLAX) 5 MG EC tablet Take 5 mg by mouth daily as needed for moderate constipation.    . clobetasol ointment (TEMOVATE) 6.29 % Apply 1 application topically 2 (two) times daily as needed (itching).   1  . EUCRISA 2 % OINT Apply 1 application topically daily as needed (dry skin).     Marland Kitchen losartan (COZAAR) 25 MG tablet Take 1 tablet (25 mg total) by mouth daily. 30 tablet 3  . metoprolol succinate (TOPROL-XL) 50 MG 24 hr tablet Take 1 tablet (50 mg total) by mouth daily. Take with or immediately following a meal. 90 tablet 3  . OLOPATADINE HCL OP Place 1 drop into both eyes 2 (two) times daily.    .  Probiotic Product (PROBIOTIC PO) Take 1 capsule by mouth daily.     . rosuvastatin (CRESTOR) 10 MG tablet Take 10 mg by mouth daily.    Marland Kitchen torsemide (DEMADEX) 20 MG tablet Monday, Wednesday, Friday (Patient taking differently: Take 20 mg by mouth every Monday, Wednesday, and Friday. ) 30 tablet 1  . ALPRAZolam (XANAX) 0.25 MG tablet Take 1 tablet by mouth daily as needed for anxiety.  (Patient not taking: Reported on 06/05/2020)    . Menthol (ICY HOT) 5 % PTCH Apply 1 application topically daily as needed (pain). (Patient not taking: Reported on 06/05/2020)    . nitroGLYCERIN (NITROSTAT) 0.4 MG SL tablet Place 1 tablet (0.4 mg total) under the tongue every 5 (five) minutes as needed for chest pain. (Patient not taking: Reported on 06/05/2020) 90 tablet 3   No current facility-administered medications on file prior to visit.    Cardiovascular studies:  EKG 06/05/2020: Sinus rhythm with ventricular pacing  Scheduled  In office pacemaker check 06/04/20  Single (S)/Dual (D)/BV: D. Presenting A sensed V paced 74 bpm. Pacemaker dependant:  Yes. Underlying V paced at 30 bpm AP 19.8%, VP 99.8%. AMS Episodes 2.  AT/AF burden N/A% . Longest 4 hrs on 6/15 Afib. HVR 6. Longest 13 beat NSVT. Longevity 8.6 Years. Magnet rate: >85%. Lead measurements: Stable. Thoracic impedance: N/A. Histogram: Low (L)/normal (N)/high (H)  Low. No rate response on. Patient activity <1 hr/day.   Observations:  As above. Changes: Rate response   Echocardiogram 04/04/2020:  Severely depressed LV systolic function with visual EF 25-30%. Left  ventricle cavity is normal in size. Moderate to severe left ventricular  hypertrophy. No obvious regional wall motion abnormalities. Grade II  diastolic dysfunction, elevated LAP.  Left atrial cavity is severely dilated 35m/m2. Interatrial septum bulges  to the right suggests elevated LAP. The interatrial Septum is thin and  mobile but appears to be intact by 2D and CF Doppler  interrogation.  Endocardial device lead noted in the right cardiac chambers.  Mild to moderate aortic regurgitation.  Moderate (Grade II) mitral regurgitation.  Mild tricuspid regurgitation. No evidence of pulmonary hypertension. RVSP  measures 32 mmHg.  Mild pulmonic regurgitation.  Small pericardial effusion located posteriorly.  Compared to previous study dated 08/06/2018, LVEF is now reduced from 50% to  30% , now Grade 2 DD, and LA is now severely dilated.   Scheduled Remote pacemaker check  11/01/19: No AHR or VHR episodes. Health trends are normal. Battery longevity is 8.6 years. RA pacing is 15.8 %, RV pacing is 99.9 %. No further NSVT noted on 09/27/2019.   Scheduled  In office pacemaker check 09/23/19:  Single (S)/Dual (D)/BV: D. Presenting AS/VP. Pacemaker dependant:  Yes. Underlying CHB. AP 12.3%, VP 99.7%.  AT/AF burden <0.1% 1 atrial tachycardia episode Longest 00:36 sec. Latest 154 bpm. HVR: NSVT. Longest 00:09. Latest 182 bpm. Longevity 8.7 Years. Lead measurements: Stable. Histogram: Low (L)/normal (N)/high (H)  N. Patient activity <2 hr/day.   Lower extremity venous duplex 08/06/2018: No evidence of deep vein thrombosis of the lower extremities with normal venous return.  Lexiscan myoview stress test 08/04/2017: 1. Non-diagnostic due to paced rhythm. 2. Study quality: good. The left ventricular chamber dimensions are normal. There is medium scar of the inferolateral segment with moderate to severe residual ischemia. There is small scar of the inferior segment with very mild residual ischemia. 3. Systolic function is mildly reduced. The calculated stress EF is at 44 %. Gated SPECT imaging demonstrates hypokinesis in the inferior, lateral segment(s). 5. Intermediate risk study. Clinical correlation recommended.  Coronary angiogram 11/11/2017: LM: Normal LAD: D1 40% stneoses LCx: Tandem 50% stenoses in mid LCx RCA: Normal  Moderate coronary artery disease. She does have  abnormal stress test in inferolateral territory, they are out of proportion to the coronary artery findings. Furthermore, she does not have any critical unstable lesion, to explain nonsustained ventricular tachycardia. She does not have any ongoing chest pain symptoms at this time.Recommend aggressive medical management at this time. If true anginal symptoms occur, could then perform PCI to mid circumflex.  Pacemaker Implant 07/16/2017: Medtronic Azure XT DR MRI Sure Scan (serial number [id number]) dual chamber pacemaker 07[facility]16/2017 [facility].  Recent labs: 04/26/2020: Glucose 97, BUN/Cr 29/1.75. EGFR 31. Na/K 129/4.0.  BNP 2647 (1271 on 04/06/2020)  04/06/2020: BNP 1371  03/22/2020: Glucose 91, BUN/Cr 22/1.18. EGFR 51. Na/K 139/4.1. Calcium 11.7. Rest of the CMP normal H/H 10.5/31.7. MCV 85. Platelets 220 BNP 2896  02/29/2020: Chol 102, TG 52, HDL 49, LDL 52  09/2019: BUN/Cr 33/1.3 HbA1C 5.3% TSH 2.6 normal   Lab Results  Component Value Date   TSH 1.987 07/15/2017     Review of Systems  Cardiovascular: Negative for chest pain, dyspnea on exertion, leg swelling, palpitations and syncope.        Objective:    Vitals:   06/05/20 1132  BP: 133/81  Pulse: 60  Resp: 17  SpO2: 96%     Physical Exam  Physical Exam Vitals and nursing note reviewed.  Constitutional:      Appearance: She is well-developed.  Neck:     Vascular: No JVD.  Cardiovascular:     Rate and Rhythm: Normal rate and regular rhythm.     Pulses: Intact distal pulses.     Heart sounds: Normal heart sounds. No murmur heard.   Pulmonary:     Effort: Pulmonary effort is normal.     Breath sounds: Normal breath sounds. No wheezing or rales.  Musculoskeletal:     Right lower leg: No edema (Trace).     Left lower leg: No edema (Trace).          Assessment & Recommendations:   79 year old [place] American female with hypertension, dual-chamber pacemaker placement for AV  block, moderate, medically managed coronary artery disease (LCx), hypertension, NSVT, now with acute systolic heart failure  Chronic systolic heart failure: Appears euvolumic today. Will check BNP, BMP. Continue losartan 25 mg daily, metoprolol succinate 50 mg daily. Did not tolerate Entresto due to skin rash.  Etiology of heart failure remains unclear.  Differentials include coronary artery disease, paroxysmal atrial fibrillation, RV pacing. She wants to hold off any invasive workup or management at this time.  Paroxysmal atrial fibrillation: Currently in sinus rhythm CHA2DS2-VASc score 4, annual stroke risk 5%. Continue eiquis 2.5 mg twice daily.  Stopped aspirin at this time to avoid bleeding risk.  CAD: Continue statin, metoprolol succinate.  NSVT: Continue heart failure management as above  Hypertension:  Controlled.  F/u in 4 weeks  [place], MD [facility] [place] Pager: [phone] Office: [phone] If no answer Cell [phone]

## 2020-06-05 ENCOUNTER — Other Ambulatory Visit: Payer: Self-pay

## 2020-06-05 ENCOUNTER — Encounter: Payer: Self-pay | Admitting: Cardiology

## 2020-06-05 ENCOUNTER — Ambulatory Visit: Payer: Medicare Other | Admitting: Cardiology

## 2020-06-05 VITALS — BP 133/81 | HR 60 | Resp 17 | Ht 59.0 in | Wt 127.0 lb

## 2020-06-05 DIAGNOSIS — E782 Mixed hyperlipidemia: Secondary | ICD-10-CM | POA: Diagnosis not present

## 2020-06-05 DIAGNOSIS — I5020 Unspecified systolic (congestive) heart failure: Secondary | ICD-10-CM

## 2020-06-05 DIAGNOSIS — I472 Ventricular tachycardia: Secondary | ICD-10-CM

## 2020-06-05 DIAGNOSIS — I2510 Atherosclerotic heart disease of native coronary artery without angina pectoris: Secondary | ICD-10-CM | POA: Diagnosis not present

## 2020-06-05 DIAGNOSIS — J45909 Unspecified asthma, uncomplicated: Secondary | ICD-10-CM | POA: Diagnosis not present

## 2020-06-05 DIAGNOSIS — I480 Paroxysmal atrial fibrillation: Secondary | ICD-10-CM

## 2020-06-05 DIAGNOSIS — I10 Essential (primary) hypertension: Secondary | ICD-10-CM | POA: Diagnosis not present

## 2020-06-05 DIAGNOSIS — Q652 Congenital dislocation of hip, unspecified: Secondary | ICD-10-CM | POA: Diagnosis not present

## 2020-06-05 DIAGNOSIS — M479 Spondylosis, unspecified: Secondary | ICD-10-CM | POA: Diagnosis not present

## 2020-06-05 DIAGNOSIS — M1610 Unilateral primary osteoarthritis, unspecified hip: Secondary | ICD-10-CM | POA: Diagnosis not present

## 2020-06-05 DIAGNOSIS — I25118 Atherosclerotic heart disease of native coronary artery with other forms of angina pectoris: Secondary | ICD-10-CM

## 2020-06-05 DIAGNOSIS — I4729 Other ventricular tachycardia: Secondary | ICD-10-CM

## 2020-06-05 DIAGNOSIS — Z7901 Long term (current) use of anticoagulants: Secondary | ICD-10-CM | POA: Diagnosis not present

## 2020-06-05 DIAGNOSIS — Z7982 Long term (current) use of aspirin: Secondary | ICD-10-CM | POA: Diagnosis not present

## 2020-06-05 DIAGNOSIS — G5603 Carpal tunnel syndrome, bilateral upper limbs: Secondary | ICD-10-CM | POA: Diagnosis not present

## 2020-06-05 DIAGNOSIS — G629 Polyneuropathy, unspecified: Secondary | ICD-10-CM | POA: Diagnosis not present

## 2020-06-05 MED ORDER — ROSUVASTATIN CALCIUM 10 MG PO TABS: 10.0000 mg | ORAL_TABLET | Freq: Every day | ORAL | 2 refills | Status: DC

## 2020-06-06 LAB — BASIC METABOLIC PANEL
BUN/Creatinine Ratio: 19 (ref 12–28)
BUN: 25 mg/dL (ref 8–27)
CO2: 21 mmol/L (ref 20–29)
Calcium: 11.9 mg/dL — ABNORMAL HIGH (ref 8.7–10.3)
Chloride: 97 mmol/L (ref 96–106)
Creatinine, Ser: 1.29 mg/dL — ABNORMAL HIGH (ref 0.57–1.00)
GFR calc Af Amer: 45 mL/min/{1.73_m2} — ABNORMAL LOW (ref >59)
GFR calc non Af Amer: 39 mL/min/{1.73_m2} — ABNORMAL LOW (ref >59)
Glucose: 79 mg/dL (ref 65–99)
Potassium: 4.5 mmol/L (ref 3.5–5.2)
Sodium: 131 mmol/L — ABNORMAL LOW (ref 134–144)

## 2020-06-06 LAB — BRAIN NATRIURETIC PEPTIDE: BNP: 1854.5 pg/mL — ABNORMAL HIGH (ref 0.0–100.0)

## 2020-06-07 NOTE — Progress Notes (Signed)
Reviewed and discussed with the patient. Continue to monitor daily weights. Currently takes torsemide M,W,F and has no overt symptoms s/p heart failure. She knows to take additional dose for leg edema, shortness of breath, weight >130 lbs.  Also discussed with the patient the possibility of systolic failure occurring from 100% RV pacing and consideration for BiV pacing. Will discuss further during upcoming appt.  Nigel Mormon, MD [facility] [place] Pager: [phone] Office: [phone]

## 2020-06-08 DIAGNOSIS — I2510 Atherosclerotic heart disease of native coronary artery without angina pectoris: Secondary | ICD-10-CM | POA: Diagnosis not present

## 2020-06-08 DIAGNOSIS — G5603 Carpal tunnel syndrome, bilateral upper limbs: Secondary | ICD-10-CM | POA: Diagnosis not present

## 2020-06-08 DIAGNOSIS — M479 Spondylosis, unspecified: Secondary | ICD-10-CM | POA: Diagnosis not present

## 2020-06-08 DIAGNOSIS — E782 Mixed hyperlipidemia: Secondary | ICD-10-CM | POA: Diagnosis not present

## 2020-06-08 DIAGNOSIS — I10 Essential (primary) hypertension: Secondary | ICD-10-CM | POA: Diagnosis not present

## 2020-06-08 DIAGNOSIS — J45909 Unspecified asthma, uncomplicated: Secondary | ICD-10-CM | POA: Diagnosis not present

## 2020-06-08 DIAGNOSIS — M1610 Unilateral primary osteoarthritis, unspecified hip: Secondary | ICD-10-CM | POA: Diagnosis not present

## 2020-06-08 DIAGNOSIS — G629 Polyneuropathy, unspecified: Secondary | ICD-10-CM | POA: Diagnosis not present

## 2020-06-08 DIAGNOSIS — Z7982 Long term (current) use of aspirin: Secondary | ICD-10-CM | POA: Diagnosis not present

## 2020-06-08 DIAGNOSIS — Z7901 Long term (current) use of anticoagulants: Secondary | ICD-10-CM | POA: Diagnosis not present

## 2020-06-08 DIAGNOSIS — Q652 Congenital dislocation of hip, unspecified: Secondary | ICD-10-CM | POA: Diagnosis not present

## 2020-06-12 DIAGNOSIS — M479 Spondylosis, unspecified: Secondary | ICD-10-CM | POA: Diagnosis not present

## 2020-06-12 DIAGNOSIS — Z7901 Long term (current) use of anticoagulants: Secondary | ICD-10-CM | POA: Diagnosis not present

## 2020-06-12 DIAGNOSIS — G5603 Carpal tunnel syndrome, bilateral upper limbs: Secondary | ICD-10-CM | POA: Diagnosis not present

## 2020-06-12 DIAGNOSIS — Z7982 Long term (current) use of aspirin: Secondary | ICD-10-CM | POA: Diagnosis not present

## 2020-06-12 DIAGNOSIS — Q652 Congenital dislocation of hip, unspecified: Secondary | ICD-10-CM | POA: Diagnosis not present

## 2020-06-12 DIAGNOSIS — G629 Polyneuropathy, unspecified: Secondary | ICD-10-CM | POA: Diagnosis not present

## 2020-06-12 DIAGNOSIS — I2510 Atherosclerotic heart disease of native coronary artery without angina pectoris: Secondary | ICD-10-CM | POA: Diagnosis not present

## 2020-06-12 DIAGNOSIS — I10 Essential (primary) hypertension: Secondary | ICD-10-CM | POA: Diagnosis not present

## 2020-06-12 DIAGNOSIS — J45909 Unspecified asthma, uncomplicated: Secondary | ICD-10-CM | POA: Diagnosis not present

## 2020-06-12 DIAGNOSIS — E782 Mixed hyperlipidemia: Secondary | ICD-10-CM | POA: Diagnosis not present

## 2020-06-12 DIAGNOSIS — M1610 Unilateral primary osteoarthritis, unspecified hip: Secondary | ICD-10-CM | POA: Diagnosis not present

## 2020-06-14 DIAGNOSIS — I10 Essential (primary) hypertension: Secondary | ICD-10-CM | POA: Diagnosis not present

## 2020-06-14 DIAGNOSIS — G5603 Carpal tunnel syndrome, bilateral upper limbs: Secondary | ICD-10-CM | POA: Diagnosis not present

## 2020-06-14 DIAGNOSIS — Z7982 Long term (current) use of aspirin: Secondary | ICD-10-CM | POA: Diagnosis not present

## 2020-06-14 DIAGNOSIS — M1610 Unilateral primary osteoarthritis, unspecified hip: Secondary | ICD-10-CM | POA: Diagnosis not present

## 2020-06-14 DIAGNOSIS — Z7901 Long term (current) use of anticoagulants: Secondary | ICD-10-CM | POA: Diagnosis not present

## 2020-06-14 DIAGNOSIS — G629 Polyneuropathy, unspecified: Secondary | ICD-10-CM | POA: Diagnosis not present

## 2020-06-14 DIAGNOSIS — E782 Mixed hyperlipidemia: Secondary | ICD-10-CM | POA: Diagnosis not present

## 2020-06-14 DIAGNOSIS — J45909 Unspecified asthma, uncomplicated: Secondary | ICD-10-CM | POA: Diagnosis not present

## 2020-06-14 DIAGNOSIS — I2510 Atherosclerotic heart disease of native coronary artery without angina pectoris: Secondary | ICD-10-CM | POA: Diagnosis not present

## 2020-06-14 DIAGNOSIS — M479 Spondylosis, unspecified: Secondary | ICD-10-CM | POA: Diagnosis not present

## 2020-06-14 DIAGNOSIS — Q652 Congenital dislocation of hip, unspecified: Secondary | ICD-10-CM | POA: Diagnosis not present

## 2020-06-19 DIAGNOSIS — Z7982 Long term (current) use of aspirin: Secondary | ICD-10-CM | POA: Diagnosis not present

## 2020-06-19 DIAGNOSIS — Q652 Congenital dislocation of hip, unspecified: Secondary | ICD-10-CM | POA: Diagnosis not present

## 2020-06-19 DIAGNOSIS — M479 Spondylosis, unspecified: Secondary | ICD-10-CM | POA: Diagnosis not present

## 2020-06-19 DIAGNOSIS — I10 Essential (primary) hypertension: Secondary | ICD-10-CM | POA: Diagnosis not present

## 2020-06-19 DIAGNOSIS — G629 Polyneuropathy, unspecified: Secondary | ICD-10-CM | POA: Diagnosis not present

## 2020-06-19 DIAGNOSIS — M1610 Unilateral primary osteoarthritis, unspecified hip: Secondary | ICD-10-CM | POA: Diagnosis not present

## 2020-06-19 DIAGNOSIS — G5603 Carpal tunnel syndrome, bilateral upper limbs: Secondary | ICD-10-CM | POA: Diagnosis not present

## 2020-06-19 DIAGNOSIS — E782 Mixed hyperlipidemia: Secondary | ICD-10-CM | POA: Diagnosis not present

## 2020-06-19 DIAGNOSIS — J45909 Unspecified asthma, uncomplicated: Secondary | ICD-10-CM | POA: Diagnosis not present

## 2020-06-19 DIAGNOSIS — Z7901 Long term (current) use of anticoagulants: Secondary | ICD-10-CM | POA: Diagnosis not present

## 2020-06-19 DIAGNOSIS — I2510 Atherosclerotic heart disease of native coronary artery without angina pectoris: Secondary | ICD-10-CM | POA: Diagnosis not present

## 2020-06-22 DIAGNOSIS — E782 Mixed hyperlipidemia: Secondary | ICD-10-CM | POA: Diagnosis not present

## 2020-06-22 DIAGNOSIS — Q652 Congenital dislocation of hip, unspecified: Secondary | ICD-10-CM | POA: Diagnosis not present

## 2020-06-22 DIAGNOSIS — J45909 Unspecified asthma, uncomplicated: Secondary | ICD-10-CM | POA: Diagnosis not present

## 2020-06-22 DIAGNOSIS — G5603 Carpal tunnel syndrome, bilateral upper limbs: Secondary | ICD-10-CM | POA: Diagnosis not present

## 2020-06-22 DIAGNOSIS — Z7901 Long term (current) use of anticoagulants: Secondary | ICD-10-CM | POA: Diagnosis not present

## 2020-06-22 DIAGNOSIS — M479 Spondylosis, unspecified: Secondary | ICD-10-CM | POA: Diagnosis not present

## 2020-06-22 DIAGNOSIS — I10 Essential (primary) hypertension: Secondary | ICD-10-CM | POA: Diagnosis not present

## 2020-06-22 DIAGNOSIS — I2510 Atherosclerotic heart disease of native coronary artery without angina pectoris: Secondary | ICD-10-CM | POA: Diagnosis not present

## 2020-06-22 DIAGNOSIS — G629 Polyneuropathy, unspecified: Secondary | ICD-10-CM | POA: Diagnosis not present

## 2020-06-22 DIAGNOSIS — Z7982 Long term (current) use of aspirin: Secondary | ICD-10-CM | POA: Diagnosis not present

## 2020-06-22 DIAGNOSIS — M1610 Unilateral primary osteoarthritis, unspecified hip: Secondary | ICD-10-CM | POA: Diagnosis not present

## 2020-06-23 DIAGNOSIS — Z7982 Long term (current) use of aspirin: Secondary | ICD-10-CM | POA: Diagnosis not present

## 2020-06-23 DIAGNOSIS — G5603 Carpal tunnel syndrome, bilateral upper limbs: Secondary | ICD-10-CM | POA: Diagnosis not present

## 2020-06-23 DIAGNOSIS — M479 Spondylosis, unspecified: Secondary | ICD-10-CM | POA: Diagnosis not present

## 2020-06-23 DIAGNOSIS — Z7901 Long term (current) use of anticoagulants: Secondary | ICD-10-CM | POA: Diagnosis not present

## 2020-06-23 DIAGNOSIS — M1610 Unilateral primary osteoarthritis, unspecified hip: Secondary | ICD-10-CM | POA: Diagnosis not present

## 2020-06-23 DIAGNOSIS — I10 Essential (primary) hypertension: Secondary | ICD-10-CM | POA: Diagnosis not present

## 2020-06-23 DIAGNOSIS — E782 Mixed hyperlipidemia: Secondary | ICD-10-CM | POA: Diagnosis not present

## 2020-06-23 DIAGNOSIS — G629 Polyneuropathy, unspecified: Secondary | ICD-10-CM | POA: Diagnosis not present

## 2020-06-23 DIAGNOSIS — I2510 Atherosclerotic heart disease of native coronary artery without angina pectoris: Secondary | ICD-10-CM | POA: Diagnosis not present

## 2020-06-23 DIAGNOSIS — J45909 Unspecified asthma, uncomplicated: Secondary | ICD-10-CM | POA: Diagnosis not present

## 2020-06-23 DIAGNOSIS — Q652 Congenital dislocation of hip, unspecified: Secondary | ICD-10-CM | POA: Diagnosis not present

## 2020-06-26 DIAGNOSIS — E782 Mixed hyperlipidemia: Secondary | ICD-10-CM | POA: Diagnosis not present

## 2020-06-26 DIAGNOSIS — M479 Spondylosis, unspecified: Secondary | ICD-10-CM | POA: Diagnosis not present

## 2020-06-26 DIAGNOSIS — Q652 Congenital dislocation of hip, unspecified: Secondary | ICD-10-CM | POA: Diagnosis not present

## 2020-06-26 DIAGNOSIS — Z7982 Long term (current) use of aspirin: Secondary | ICD-10-CM | POA: Diagnosis not present

## 2020-06-26 DIAGNOSIS — M1610 Unilateral primary osteoarthritis, unspecified hip: Secondary | ICD-10-CM | POA: Diagnosis not present

## 2020-06-26 DIAGNOSIS — G5603 Carpal tunnel syndrome, bilateral upper limbs: Secondary | ICD-10-CM | POA: Diagnosis not present

## 2020-06-26 DIAGNOSIS — Z7901 Long term (current) use of anticoagulants: Secondary | ICD-10-CM | POA: Diagnosis not present

## 2020-06-26 DIAGNOSIS — G629 Polyneuropathy, unspecified: Secondary | ICD-10-CM | POA: Diagnosis not present

## 2020-06-26 DIAGNOSIS — I10 Essential (primary) hypertension: Secondary | ICD-10-CM | POA: Diagnosis not present

## 2020-06-26 DIAGNOSIS — I2510 Atherosclerotic heart disease of native coronary artery without angina pectoris: Secondary | ICD-10-CM | POA: Diagnosis not present

## 2020-06-26 DIAGNOSIS — J45909 Unspecified asthma, uncomplicated: Secondary | ICD-10-CM | POA: Diagnosis not present

## 2020-06-27 DIAGNOSIS — I129 Hypertensive chronic kidney disease with stage 1 through stage 4 chronic kidney disease, or unspecified chronic kidney disease: Secondary | ICD-10-CM | POA: Diagnosis not present

## 2020-06-27 DIAGNOSIS — D892 Hypergammaglobulinemia, unspecified: Secondary | ICD-10-CM | POA: Diagnosis not present

## 2020-06-27 DIAGNOSIS — I2510 Atherosclerotic heart disease of native coronary artery without angina pectoris: Secondary | ICD-10-CM | POA: Diagnosis not present

## 2020-06-30 DIAGNOSIS — G629 Polyneuropathy, unspecified: Secondary | ICD-10-CM | POA: Diagnosis not present

## 2020-06-30 DIAGNOSIS — E782 Mixed hyperlipidemia: Secondary | ICD-10-CM | POA: Diagnosis not present

## 2020-06-30 DIAGNOSIS — Q652 Congenital dislocation of hip, unspecified: Secondary | ICD-10-CM | POA: Diagnosis not present

## 2020-06-30 DIAGNOSIS — G5603 Carpal tunnel syndrome, bilateral upper limbs: Secondary | ICD-10-CM | POA: Diagnosis not present

## 2020-06-30 DIAGNOSIS — M1610 Unilateral primary osteoarthritis, unspecified hip: Secondary | ICD-10-CM | POA: Diagnosis not present

## 2020-06-30 DIAGNOSIS — J45909 Unspecified asthma, uncomplicated: Secondary | ICD-10-CM | POA: Diagnosis not present

## 2020-06-30 DIAGNOSIS — Z7901 Long term (current) use of anticoagulants: Secondary | ICD-10-CM | POA: Diagnosis not present

## 2020-06-30 DIAGNOSIS — Z7982 Long term (current) use of aspirin: Secondary | ICD-10-CM | POA: Diagnosis not present

## 2020-06-30 DIAGNOSIS — I2510 Atherosclerotic heart disease of native coronary artery without angina pectoris: Secondary | ICD-10-CM | POA: Diagnosis not present

## 2020-06-30 DIAGNOSIS — M479 Spondylosis, unspecified: Secondary | ICD-10-CM | POA: Diagnosis not present

## 2020-06-30 DIAGNOSIS — I10 Essential (primary) hypertension: Secondary | ICD-10-CM | POA: Diagnosis not present

## 2020-07-02 NOTE — Progress Notes (Signed)
Patient is here for follow up visit.  Subjective:   _0  ID: Holly [name], female    DOB: [birth year], 79 y.o.   MRN: [id number]   Chief Complaint  Patient presents with  . Coronary Artery Disease  . HFrEF  . Follow-up    4 week    HPI  79 year old African American female with hypertension, dual-chamber pacemaker placement for AV block, moderate, medically managed coronary artery disease (LCx), hypertension, NSVT, HFrEF  Patient has not had any significant change in exertional dyspnea, leg edema. Weight has trended up slightly on home checks from 130 lb to 133 lb, although weight in office is stable.   Current Outpatient Medications on File Prior to Visit  Medication Sig Dispense Refill  . ALPRAZolam (XANAX) 0.25 MG tablet Take 1 tablet by mouth daily as needed for anxiety.  (Patient not taking: Reported on 06/05/2020)    . apixaban (ELIQUIS) 2.5 MG TABS tablet Take 1 tablet (2.5 mg total) by mouth 2 (two) times daily. 60 tablet 2  . aspirin EC 81 MG tablet Take 81 mg by mouth daily. Swallow whole.    . bisacodyl (DULCOLAX) 5 MG EC tablet Take 5 mg by mouth daily as needed for moderate constipation.    . clobetasol ointment (TEMOVATE) 8.12 % Apply 1 application topically 2 (two) times daily as needed (itching).   1  . EUCRISA 2 % OINT Apply 1 application topically daily as needed (dry skin).     Marland Kitchen losartan (COZAAR) 25 MG tablet Take 1 tablet (25 mg total) by mouth daily. 30 tablet 3  . Menthol (ICY HOT) 5 % PTCH Apply 1 application topically daily as needed (pain). (Patient not taking: Reported on 06/05/2020)    . metoprolol succinate (TOPROL-XL) 50 MG 24 hr tablet Take 1 tablet (50 mg total) by mouth daily. Take with or immediately following a meal. 90 tablet 3  . nitroGLYCERIN (NITROSTAT) 0.4 MG SL tablet Place 1 tablet (0.4 mg total) under the tongue every 5 (five) minutes as needed for chest pain. (Patient not taking: Reported on 06/05/2020) 90 tablet 3  . OLOPATADINE HCL OP  Place 1 drop into both eyes 2 (two) times daily.    . Probiotic Product (PROBIOTIC PO) Take 1 capsule by mouth daily.     . rosuvastatin (CRESTOR) 10 MG tablet Take 1 tablet (10 mg total) by mouth daily. 90 tablet 2  . torsemide (DEMADEX) 20 MG tablet Monday, Wednesday, Friday (Patient taking differently: Take 20 mg by mouth every Monday, Wednesday, and Friday. ) 30 tablet 1   No current facility-administered medications on file prior to visit.    Cardiovascular studies:  EKG 06/05/2020: Sinus rhythm with ventricular pacing  Scheduled  In office pacemaker check 07/02/20  Single (S)/Dual (D)/BV: D. Presenting A sensed V paced 74 bpm. Pacemaker dependant:  Yes. Underlying V paced at 30 bpm AP 19.8%, VP 99.8%. AMS Episodes 2.  AT/AF burden N/A% . Longest 4 hrs on 6/15 Afib. HVR 6. Longest 13 beat NSVT. Longevity 8.6 Years. Magnet rate: >85%. Lead measurements: Stable. Thoracic impedance: N/A. Histogram: Low (L)/normal (N)/high (H)  Low. No rate response on. Patient activity <1 hr/day.   Observations: As above. Changes: Rate response   Echocardiogram 04/04/2020:  Severely depressed LV systolic function with visual EF 25-30%. Left  ventricle cavity is normal in size. Moderate to severe left ventricular  hypertrophy. No obvious regional wall motion abnormalities. Grade II  diastolic dysfunction, elevated LAP.  Left atrial cavity  is severely dilated 36m/m2. Interatrial septum bulges  to the right suggests elevated LAP. The interatrial Septum is thin and  mobile but appears to be intact by 2D and CF Doppler interrogation.  Endocardial device lead noted in the right cardiac chambers.  Mild to moderate aortic regurgitation.  Moderate (Grade II) mitral regurgitation.  Mild tricuspid regurgitation. No evidence of pulmonary hypertension. RVSP  measures 32 mmHg.  Mild pulmonic regurgitation.  Small pericardial effusion located posteriorly.  Compared to previous study dated 08/06/2018,  LVEF is now reduced from 50% to  30% , now Grade 2 DD, and LA is now severely dilated.   Scheduled Remote pacemaker check  11/01/19: No AHR or VHR episodes. Health trends are normal. Battery longevity is 8.6 years. RA pacing is 15.8 %, RV pacing is 99.9 %. No further NSVT noted on 09/27/2019.   Scheduled  In office pacemaker check 09/23/19:  Single (S)/Dual (D)/BV: D. Presenting AS/VP. Pacemaker dependant:  Yes. Underlying CHB. AP 12.3%, VP 99.7%.  AT/AF burden <0.1% 1 atrial tachycardia episode Longest 00:36 sec. Latest 154 bpm. HVR: NSVT. Longest 00:09. Latest 182 bpm. Longevity 8.7 Years. Lead measurements: Stable. Histogram: Low (L)/normal (N)/high (H)  N. Patient activity <2 hr/day.   Lower extremity venous duplex 08/06/2018: No evidence of deep vein thrombosis of the lower extremities with normal venous return.  Lexiscan myoview stress test 08/04/2017: 1. Non-diagnostic due to paced rhythm. 2. Study quality: good. The left ventricular chamber dimensions are normal. There is medium scar of the inferolateral segment with moderate to severe residual ischemia. There is small scar of the inferior segment with very mild residual ischemia. 3. Systolic function is mildly reduced. The calculated stress EF is at 44 %. Gated SPECT imaging demonstrates hypokinesis in the inferior, lateral segment(s). 5. Intermediate risk study. Clinical correlation recommended.  Coronary angiogram 11/11/2017: LM: Normal LAD: D1 40% stneoses LCx: Tandem 50% stenoses in mid LCx RCA: Normal  Moderate coronary artery disease. She does have abnormal stress test in inferolateral territory, they are out of proportion to the coronary artery findings. Furthermore, she does not have any critical unstable lesion, to explain nonsustained ventricular tachycardia. She does not have any ongoing chest pain symptoms at this time.Recommend aggressive medical management at this time. If true anginal symptoms occur, could then perform  PCI to mid circumflex.  Pacemaker Implant 07/16/2017: Medtronic Azure XT DR MRI Sure Scan (serial number [id number]) dual chamber pacemaker [facility].  Recent labs: 06/05/2020: Glucose 79, BUN/Cr 25/1.29. EGFR 45. Na/K 131/4.5 BNP 1854  04/26/2020: Glucose 97, BUN/Cr 29/1.75. EGFR 31. Na/K 129/4.0.  BNP 2647 (1271 on 04/06/2020)  04/06/2020: BNP 1371  03/22/2020: Glucose 91, BUN/Cr 22/1.18. EGFR 51. Na/K 139/4.1. Calcium 11.7. Rest of the CMP normal H/H 10.5/31.7. MCV 85. Platelets 220 BNP 2896  02/29/2020: Chol 102, TG 52, HDL 49, LDL 52  09/2019: BUN/Cr 33/1.3 HbA1C 5.3% TSH 2.6 normal   Lab Results  Component Value Date   TSH 1.987 07/15/2017    Review of Systems  Cardiovascular: Positive for dyspnea on exertion and leg swelling. Negative for chest pain, palpitations and syncope.         Objective:    Vitals:   07/03/20 1106  BP: 119/68  Pulse: 72  Resp: 16  SpO2: 97%       Physical Exam Vitals and nursing note reviewed.  Constitutional:      General: She is not in acute distress. Neck:     Vascular:  No JVD.  Cardiovascular:     Rate and Rhythm: Normal rate and regular rhythm.     Heart sounds: Normal heart sounds. No murmur heard.   Pulmonary:     Effort: Pulmonary effort is normal.     Breath sounds: Normal breath sounds. No wheezing or rales.  Musculoskeletal:     Right lower leg: Edema (1+) present.     Left lower leg: Edema (1+) present.           Assessment & Recommendations:   79 year old [place] American female with hypertension, dual-chamber pacemaker placement for AV block, moderate, medically managed coronary artery disease (LCx), hypertension, NSVT, HFrEF  Chronic systolic heart failure: Slight increase in weights. Added spironolactone 12.5 mg daily. Reduce torsemide to 20 mg as needed, Continue losartan 25 mg daily, metoprolol succinate 50 mg daily. Did not tolerate Entresto due to skin  rash.  Etiology of heart failure remains unclear.  Differentials include coronary artery disease, paroxysmal atrial fibrillation, RV pacing. She wants to hold off any invasive workup or management at this time.  Paroxysmal atrial fibrillation: Currently in sinus rhythm CHA2DS2-VASc score 4, annual stroke risk 5%. Continue eiquis 2.5 mg twice daily.  Stopped aspirin at this time to avoid bleeding risk.  CAD: Continue statin, metoprolol succinate.  NSVT: Continue heart failure management as above  Hypertension:  Controlled.  On a separate note, patient is ongoing workup with cancer center. I suspect this may be due to her elevated calcium and overall weight loss. Will follow up.   Labs in 2 weeks F/u in 4 weeks  Manish Esther Hardy, MD [facility] Pager: [phone] Office: [phone] If no answer Cell [phone]

## 2020-07-03 ENCOUNTER — Encounter: Payer: Self-pay | Admitting: Cardiology

## 2020-07-03 ENCOUNTER — Ambulatory Visit: Payer: Medicare Other | Admitting: Cardiology

## 2020-07-03 ENCOUNTER — Other Ambulatory Visit: Payer: Self-pay

## 2020-07-03 VITALS — BP 119/68 | HR 72 | Resp 16 | Ht 59.0 in | Wt 126.0 lb

## 2020-07-03 DIAGNOSIS — M479 Spondylosis, unspecified: Secondary | ICD-10-CM | POA: Diagnosis not present

## 2020-07-03 DIAGNOSIS — I480 Paroxysmal atrial fibrillation: Secondary | ICD-10-CM

## 2020-07-03 DIAGNOSIS — I4729 Other ventricular tachycardia: Secondary | ICD-10-CM

## 2020-07-03 DIAGNOSIS — Z7901 Long term (current) use of anticoagulants: Secondary | ICD-10-CM | POA: Diagnosis not present

## 2020-07-03 DIAGNOSIS — I2510 Atherosclerotic heart disease of native coronary artery without angina pectoris: Secondary | ICD-10-CM | POA: Diagnosis not present

## 2020-07-03 DIAGNOSIS — E782 Mixed hyperlipidemia: Secondary | ICD-10-CM | POA: Diagnosis not present

## 2020-07-03 DIAGNOSIS — M1610 Unilateral primary osteoarthritis, unspecified hip: Secondary | ICD-10-CM | POA: Diagnosis not present

## 2020-07-03 DIAGNOSIS — I5020 Unspecified systolic (congestive) heart failure: Secondary | ICD-10-CM

## 2020-07-03 DIAGNOSIS — Z7982 Long term (current) use of aspirin: Secondary | ICD-10-CM | POA: Diagnosis not present

## 2020-07-03 DIAGNOSIS — I472 Ventricular tachycardia: Secondary | ICD-10-CM | POA: Diagnosis not present

## 2020-07-03 DIAGNOSIS — Q652 Congenital dislocation of hip, unspecified: Secondary | ICD-10-CM | POA: Diagnosis not present

## 2020-07-03 DIAGNOSIS — J45909 Unspecified asthma, uncomplicated: Secondary | ICD-10-CM | POA: Diagnosis not present

## 2020-07-03 DIAGNOSIS — G5603 Carpal tunnel syndrome, bilateral upper limbs: Secondary | ICD-10-CM | POA: Diagnosis not present

## 2020-07-03 DIAGNOSIS — I25118 Atherosclerotic heart disease of native coronary artery with other forms of angina pectoris: Secondary | ICD-10-CM

## 2020-07-03 DIAGNOSIS — G629 Polyneuropathy, unspecified: Secondary | ICD-10-CM | POA: Diagnosis not present

## 2020-07-03 DIAGNOSIS — I10 Essential (primary) hypertension: Secondary | ICD-10-CM | POA: Diagnosis not present

## 2020-07-03 MED ORDER — SPIRONOLACTONE 25 MG PO TABS: 12.5000 mg | ORAL_TABLET | Freq: Every day | ORAL | 2 refills | Status: DC

## 2020-07-04 DIAGNOSIS — J45909 Unspecified asthma, uncomplicated: Secondary | ICD-10-CM | POA: Diagnosis not present

## 2020-07-04 DIAGNOSIS — I5021 Acute systolic (congestive) heart failure: Secondary | ICD-10-CM | POA: Diagnosis not present

## 2020-07-04 DIAGNOSIS — G629 Polyneuropathy, unspecified: Secondary | ICD-10-CM | POA: Diagnosis not present

## 2020-07-04 DIAGNOSIS — G5603 Carpal tunnel syndrome, bilateral upper limbs: Secondary | ICD-10-CM | POA: Diagnosis not present

## 2020-07-04 DIAGNOSIS — E782 Mixed hyperlipidemia: Secondary | ICD-10-CM | POA: Diagnosis not present

## 2020-07-04 DIAGNOSIS — M1610 Unilateral primary osteoarthritis, unspecified hip: Secondary | ICD-10-CM | POA: Diagnosis not present

## 2020-07-04 DIAGNOSIS — I10 Essential (primary) hypertension: Secondary | ICD-10-CM | POA: Diagnosis not present

## 2020-07-04 DIAGNOSIS — Z7982 Long term (current) use of aspirin: Secondary | ICD-10-CM | POA: Diagnosis not present

## 2020-07-04 DIAGNOSIS — Q652 Congenital dislocation of hip, unspecified: Secondary | ICD-10-CM | POA: Diagnosis not present

## 2020-07-04 DIAGNOSIS — M479 Spondylosis, unspecified: Secondary | ICD-10-CM | POA: Diagnosis not present

## 2020-07-04 DIAGNOSIS — I2510 Atherosclerotic heart disease of native coronary artery without angina pectoris: Secondary | ICD-10-CM | POA: Diagnosis not present

## 2020-07-04 DIAGNOSIS — Z7901 Long term (current) use of anticoagulants: Secondary | ICD-10-CM | POA: Diagnosis not present

## 2020-07-05 DIAGNOSIS — G5603 Carpal tunnel syndrome, bilateral upper limbs: Secondary | ICD-10-CM | POA: Diagnosis not present

## 2020-07-05 DIAGNOSIS — E782 Mixed hyperlipidemia: Secondary | ICD-10-CM | POA: Diagnosis not present

## 2020-07-05 DIAGNOSIS — Z7901 Long term (current) use of anticoagulants: Secondary | ICD-10-CM | POA: Diagnosis not present

## 2020-07-05 DIAGNOSIS — J45909 Unspecified asthma, uncomplicated: Secondary | ICD-10-CM | POA: Diagnosis not present

## 2020-07-05 DIAGNOSIS — M479 Spondylosis, unspecified: Secondary | ICD-10-CM | POA: Diagnosis not present

## 2020-07-05 DIAGNOSIS — Z7982 Long term (current) use of aspirin: Secondary | ICD-10-CM | POA: Diagnosis not present

## 2020-07-05 DIAGNOSIS — I10 Essential (primary) hypertension: Secondary | ICD-10-CM | POA: Diagnosis not present

## 2020-07-05 DIAGNOSIS — M1610 Unilateral primary osteoarthritis, unspecified hip: Secondary | ICD-10-CM | POA: Diagnosis not present

## 2020-07-05 DIAGNOSIS — I2510 Atherosclerotic heart disease of native coronary artery without angina pectoris: Secondary | ICD-10-CM | POA: Diagnosis not present

## 2020-07-05 DIAGNOSIS — Q652 Congenital dislocation of hip, unspecified: Secondary | ICD-10-CM | POA: Diagnosis not present

## 2020-07-05 DIAGNOSIS — G629 Polyneuropathy, unspecified: Secondary | ICD-10-CM | POA: Diagnosis not present

## 2020-07-07 DIAGNOSIS — J45909 Unspecified asthma, uncomplicated: Secondary | ICD-10-CM | POA: Diagnosis not present

## 2020-07-07 DIAGNOSIS — I10 Essential (primary) hypertension: Secondary | ICD-10-CM | POA: Diagnosis not present

## 2020-07-07 DIAGNOSIS — G5603 Carpal tunnel syndrome, bilateral upper limbs: Secondary | ICD-10-CM | POA: Diagnosis not present

## 2020-07-07 DIAGNOSIS — M1610 Unilateral primary osteoarthritis, unspecified hip: Secondary | ICD-10-CM | POA: Diagnosis not present

## 2020-07-07 DIAGNOSIS — E782 Mixed hyperlipidemia: Secondary | ICD-10-CM | POA: Diagnosis not present

## 2020-07-07 DIAGNOSIS — I2510 Atherosclerotic heart disease of native coronary artery without angina pectoris: Secondary | ICD-10-CM | POA: Diagnosis not present

## 2020-07-07 DIAGNOSIS — M479 Spondylosis, unspecified: Secondary | ICD-10-CM | POA: Diagnosis not present

## 2020-07-07 DIAGNOSIS — Z7901 Long term (current) use of anticoagulants: Secondary | ICD-10-CM | POA: Diagnosis not present

## 2020-07-07 DIAGNOSIS — Q652 Congenital dislocation of hip, unspecified: Secondary | ICD-10-CM | POA: Diagnosis not present

## 2020-07-07 DIAGNOSIS — Z7982 Long term (current) use of aspirin: Secondary | ICD-10-CM | POA: Diagnosis not present

## 2020-07-07 DIAGNOSIS — G629 Polyneuropathy, unspecified: Secondary | ICD-10-CM | POA: Diagnosis not present

## 2020-07-12 DIAGNOSIS — E782 Mixed hyperlipidemia: Secondary | ICD-10-CM | POA: Diagnosis not present

## 2020-07-12 DIAGNOSIS — G5603 Carpal tunnel syndrome, bilateral upper limbs: Secondary | ICD-10-CM | POA: Diagnosis not present

## 2020-07-12 DIAGNOSIS — Z7982 Long term (current) use of aspirin: Secondary | ICD-10-CM | POA: Diagnosis not present

## 2020-07-12 DIAGNOSIS — I10 Essential (primary) hypertension: Secondary | ICD-10-CM | POA: Diagnosis not present

## 2020-07-12 DIAGNOSIS — G629 Polyneuropathy, unspecified: Secondary | ICD-10-CM | POA: Diagnosis not present

## 2020-07-12 DIAGNOSIS — Q652 Congenital dislocation of hip, unspecified: Secondary | ICD-10-CM | POA: Diagnosis not present

## 2020-07-12 DIAGNOSIS — I2510 Atherosclerotic heart disease of native coronary artery without angina pectoris: Secondary | ICD-10-CM | POA: Diagnosis not present

## 2020-07-12 DIAGNOSIS — J45909 Unspecified asthma, uncomplicated: Secondary | ICD-10-CM | POA: Diagnosis not present

## 2020-07-12 DIAGNOSIS — Z7901 Long term (current) use of anticoagulants: Secondary | ICD-10-CM | POA: Diagnosis not present

## 2020-07-12 DIAGNOSIS — M1610 Unilateral primary osteoarthritis, unspecified hip: Secondary | ICD-10-CM | POA: Diagnosis not present

## 2020-07-12 DIAGNOSIS — M479 Spondylosis, unspecified: Secondary | ICD-10-CM | POA: Diagnosis not present

## 2020-07-13 DIAGNOSIS — I2510 Atherosclerotic heart disease of native coronary artery without angina pectoris: Secondary | ICD-10-CM | POA: Diagnosis not present

## 2020-07-13 DIAGNOSIS — Q652 Congenital dislocation of hip, unspecified: Secondary | ICD-10-CM | POA: Diagnosis not present

## 2020-07-13 DIAGNOSIS — M1610 Unilateral primary osteoarthritis, unspecified hip: Secondary | ICD-10-CM | POA: Diagnosis not present

## 2020-07-13 DIAGNOSIS — J45909 Unspecified asthma, uncomplicated: Secondary | ICD-10-CM | POA: Diagnosis not present

## 2020-07-13 DIAGNOSIS — Z7982 Long term (current) use of aspirin: Secondary | ICD-10-CM | POA: Diagnosis not present

## 2020-07-13 DIAGNOSIS — I10 Essential (primary) hypertension: Secondary | ICD-10-CM | POA: Diagnosis not present

## 2020-07-13 DIAGNOSIS — Z7901 Long term (current) use of anticoagulants: Secondary | ICD-10-CM | POA: Diagnosis not present

## 2020-07-13 DIAGNOSIS — G629 Polyneuropathy, unspecified: Secondary | ICD-10-CM | POA: Diagnosis not present

## 2020-07-13 DIAGNOSIS — E782 Mixed hyperlipidemia: Secondary | ICD-10-CM | POA: Diagnosis not present

## 2020-07-13 DIAGNOSIS — M479 Spondylosis, unspecified: Secondary | ICD-10-CM | POA: Diagnosis not present

## 2020-07-13 DIAGNOSIS — G5603 Carpal tunnel syndrome, bilateral upper limbs: Secondary | ICD-10-CM | POA: Diagnosis not present

## 2020-07-20 DIAGNOSIS — I5020 Unspecified systolic (congestive) heart failure: Secondary | ICD-10-CM | POA: Diagnosis not present

## 2020-07-21 DIAGNOSIS — G5603 Carpal tunnel syndrome, bilateral upper limbs: Secondary | ICD-10-CM | POA: Diagnosis not present

## 2020-07-21 DIAGNOSIS — E782 Mixed hyperlipidemia: Secondary | ICD-10-CM | POA: Diagnosis not present

## 2020-07-21 DIAGNOSIS — Z7901 Long term (current) use of anticoagulants: Secondary | ICD-10-CM | POA: Diagnosis not present

## 2020-07-21 DIAGNOSIS — M1610 Unilateral primary osteoarthritis, unspecified hip: Secondary | ICD-10-CM | POA: Diagnosis not present

## 2020-07-21 DIAGNOSIS — I2510 Atherosclerotic heart disease of native coronary artery without angina pectoris: Secondary | ICD-10-CM | POA: Diagnosis not present

## 2020-07-21 DIAGNOSIS — Z23 Encounter for immunization: Secondary | ICD-10-CM | POA: Diagnosis not present

## 2020-07-21 DIAGNOSIS — G629 Polyneuropathy, unspecified: Secondary | ICD-10-CM | POA: Diagnosis not present

## 2020-07-21 DIAGNOSIS — Z7982 Long term (current) use of aspirin: Secondary | ICD-10-CM | POA: Diagnosis not present

## 2020-07-21 DIAGNOSIS — J45909 Unspecified asthma, uncomplicated: Secondary | ICD-10-CM | POA: Diagnosis not present

## 2020-07-21 DIAGNOSIS — Q652 Congenital dislocation of hip, unspecified: Secondary | ICD-10-CM | POA: Diagnosis not present

## 2020-07-21 DIAGNOSIS — M479 Spondylosis, unspecified: Secondary | ICD-10-CM | POA: Diagnosis not present

## 2020-07-21 DIAGNOSIS — I10 Essential (primary) hypertension: Secondary | ICD-10-CM | POA: Diagnosis not present

## 2020-07-21 LAB — PRO B NATRIURETIC PEPTIDE: NT-Pro BNP: 9907 pg/mL — ABNORMAL HIGH (ref 0–738)

## 2020-07-22 ENCOUNTER — Other Ambulatory Visit: Payer: Self-pay | Admitting: Cardiology

## 2020-07-22 DIAGNOSIS — I5020 Unspecified systolic (congestive) heart failure: Secondary | ICD-10-CM

## 2020-07-24 DIAGNOSIS — I2510 Atherosclerotic heart disease of native coronary artery without angina pectoris: Secondary | ICD-10-CM | POA: Diagnosis not present

## 2020-07-24 DIAGNOSIS — Z7982 Long term (current) use of aspirin: Secondary | ICD-10-CM | POA: Diagnosis not present

## 2020-07-24 DIAGNOSIS — E782 Mixed hyperlipidemia: Secondary | ICD-10-CM | POA: Diagnosis not present

## 2020-07-24 DIAGNOSIS — G629 Polyneuropathy, unspecified: Secondary | ICD-10-CM | POA: Diagnosis not present

## 2020-07-24 DIAGNOSIS — Q652 Congenital dislocation of hip, unspecified: Secondary | ICD-10-CM | POA: Diagnosis not present

## 2020-07-24 DIAGNOSIS — J45909 Unspecified asthma, uncomplicated: Secondary | ICD-10-CM | POA: Diagnosis not present

## 2020-07-24 DIAGNOSIS — G5603 Carpal tunnel syndrome, bilateral upper limbs: Secondary | ICD-10-CM | POA: Diagnosis not present

## 2020-07-24 DIAGNOSIS — I10 Essential (primary) hypertension: Secondary | ICD-10-CM | POA: Diagnosis not present

## 2020-07-24 DIAGNOSIS — M1610 Unilateral primary osteoarthritis, unspecified hip: Secondary | ICD-10-CM | POA: Diagnosis not present

## 2020-07-24 DIAGNOSIS — M479 Spondylosis, unspecified: Secondary | ICD-10-CM | POA: Diagnosis not present

## 2020-07-24 DIAGNOSIS — Z7901 Long term (current) use of anticoagulants: Secondary | ICD-10-CM | POA: Diagnosis not present

## 2020-07-25 DIAGNOSIS — I10 Essential (primary) hypertension: Secondary | ICD-10-CM | POA: Diagnosis not present

## 2020-07-25 DIAGNOSIS — G629 Polyneuropathy, unspecified: Secondary | ICD-10-CM | POA: Diagnosis not present

## 2020-07-25 DIAGNOSIS — M479 Spondylosis, unspecified: Secondary | ICD-10-CM | POA: Diagnosis not present

## 2020-07-25 DIAGNOSIS — Q652 Congenital dislocation of hip, unspecified: Secondary | ICD-10-CM | POA: Diagnosis not present

## 2020-07-25 DIAGNOSIS — E782 Mixed hyperlipidemia: Secondary | ICD-10-CM | POA: Diagnosis not present

## 2020-07-25 DIAGNOSIS — M1610 Unilateral primary osteoarthritis, unspecified hip: Secondary | ICD-10-CM | POA: Diagnosis not present

## 2020-07-25 DIAGNOSIS — Z7982 Long term (current) use of aspirin: Secondary | ICD-10-CM | POA: Diagnosis not present

## 2020-07-25 DIAGNOSIS — Z7901 Long term (current) use of anticoagulants: Secondary | ICD-10-CM | POA: Diagnosis not present

## 2020-07-25 DIAGNOSIS — G5603 Carpal tunnel syndrome, bilateral upper limbs: Secondary | ICD-10-CM | POA: Diagnosis not present

## 2020-07-25 DIAGNOSIS — J45909 Unspecified asthma, uncomplicated: Secondary | ICD-10-CM | POA: Diagnosis not present

## 2020-07-25 DIAGNOSIS — I2510 Atherosclerotic heart disease of native coronary artery without angina pectoris: Secondary | ICD-10-CM | POA: Diagnosis not present

## 2020-07-27 ENCOUNTER — Other Ambulatory Visit: Payer: Self-pay | Admitting: Cardiology

## 2020-07-27 DIAGNOSIS — I480 Paroxysmal atrial fibrillation: Secondary | ICD-10-CM

## 2020-07-27 DIAGNOSIS — I10 Essential (primary) hypertension: Secondary | ICD-10-CM | POA: Diagnosis not present

## 2020-07-27 DIAGNOSIS — M1610 Unilateral primary osteoarthritis, unspecified hip: Secondary | ICD-10-CM | POA: Diagnosis not present

## 2020-07-27 DIAGNOSIS — Z7901 Long term (current) use of anticoagulants: Secondary | ICD-10-CM | POA: Diagnosis not present

## 2020-07-27 DIAGNOSIS — M479 Spondylosis, unspecified: Secondary | ICD-10-CM | POA: Diagnosis not present

## 2020-07-27 DIAGNOSIS — I2510 Atherosclerotic heart disease of native coronary artery without angina pectoris: Secondary | ICD-10-CM | POA: Diagnosis not present

## 2020-07-27 DIAGNOSIS — Z7982 Long term (current) use of aspirin: Secondary | ICD-10-CM | POA: Diagnosis not present

## 2020-07-27 DIAGNOSIS — E782 Mixed hyperlipidemia: Secondary | ICD-10-CM | POA: Diagnosis not present

## 2020-07-27 DIAGNOSIS — G629 Polyneuropathy, unspecified: Secondary | ICD-10-CM | POA: Diagnosis not present

## 2020-07-27 DIAGNOSIS — G5603 Carpal tunnel syndrome, bilateral upper limbs: Secondary | ICD-10-CM | POA: Diagnosis not present

## 2020-07-27 DIAGNOSIS — Q652 Congenital dislocation of hip, unspecified: Secondary | ICD-10-CM | POA: Diagnosis not present

## 2020-07-27 DIAGNOSIS — J45909 Unspecified asthma, uncomplicated: Secondary | ICD-10-CM | POA: Diagnosis not present

## 2020-07-30 DIAGNOSIS — Z45018 Encounter for adjustment and management of other part of cardiac pacemaker: Secondary | ICD-10-CM | POA: Diagnosis not present

## 2020-07-30 DIAGNOSIS — Z950 Presence of cardiac pacemaker: Secondary | ICD-10-CM | POA: Diagnosis not present

## 2020-07-30 DIAGNOSIS — I442 Atrioventricular block, complete: Secondary | ICD-10-CM | POA: Diagnosis not present

## 2020-08-01 DIAGNOSIS — I2510 Atherosclerotic heart disease of native coronary artery without angina pectoris: Secondary | ICD-10-CM | POA: Diagnosis not present

## 2020-08-01 DIAGNOSIS — G629 Polyneuropathy, unspecified: Secondary | ICD-10-CM | POA: Diagnosis not present

## 2020-08-01 DIAGNOSIS — I10 Essential (primary) hypertension: Secondary | ICD-10-CM | POA: Diagnosis not present

## 2020-08-01 DIAGNOSIS — M1610 Unilateral primary osteoarthritis, unspecified hip: Secondary | ICD-10-CM | POA: Diagnosis not present

## 2020-08-01 DIAGNOSIS — E782 Mixed hyperlipidemia: Secondary | ICD-10-CM | POA: Diagnosis not present

## 2020-08-01 DIAGNOSIS — M479 Spondylosis, unspecified: Secondary | ICD-10-CM | POA: Diagnosis not present

## 2020-08-01 DIAGNOSIS — J45909 Unspecified asthma, uncomplicated: Secondary | ICD-10-CM | POA: Diagnosis not present

## 2020-08-01 DIAGNOSIS — Z7982 Long term (current) use of aspirin: Secondary | ICD-10-CM | POA: Diagnosis not present

## 2020-08-01 DIAGNOSIS — Q652 Congenital dislocation of hip, unspecified: Secondary | ICD-10-CM | POA: Diagnosis not present

## 2020-08-01 DIAGNOSIS — G5603 Carpal tunnel syndrome, bilateral upper limbs: Secondary | ICD-10-CM | POA: Diagnosis not present

## 2020-08-01 DIAGNOSIS — Z7901 Long term (current) use of anticoagulants: Secondary | ICD-10-CM | POA: Diagnosis not present

## 2020-08-03 DIAGNOSIS — I10 Essential (primary) hypertension: Secondary | ICD-10-CM | POA: Diagnosis not present

## 2020-08-03 DIAGNOSIS — J45909 Unspecified asthma, uncomplicated: Secondary | ICD-10-CM | POA: Diagnosis not present

## 2020-08-03 DIAGNOSIS — M1610 Unilateral primary osteoarthritis, unspecified hip: Secondary | ICD-10-CM | POA: Diagnosis not present

## 2020-08-03 DIAGNOSIS — I2510 Atherosclerotic heart disease of native coronary artery without angina pectoris: Secondary | ICD-10-CM | POA: Diagnosis not present

## 2020-08-03 DIAGNOSIS — Q652 Congenital dislocation of hip, unspecified: Secondary | ICD-10-CM | POA: Diagnosis not present

## 2020-08-03 DIAGNOSIS — G5603 Carpal tunnel syndrome, bilateral upper limbs: Secondary | ICD-10-CM | POA: Diagnosis not present

## 2020-08-03 DIAGNOSIS — G629 Polyneuropathy, unspecified: Secondary | ICD-10-CM | POA: Diagnosis not present

## 2020-08-03 DIAGNOSIS — Z7982 Long term (current) use of aspirin: Secondary | ICD-10-CM | POA: Diagnosis not present

## 2020-08-03 DIAGNOSIS — M479 Spondylosis, unspecified: Secondary | ICD-10-CM | POA: Diagnosis not present

## 2020-08-03 DIAGNOSIS — Z7901 Long term (current) use of anticoagulants: Secondary | ICD-10-CM | POA: Diagnosis not present

## 2020-08-03 DIAGNOSIS — E782 Mixed hyperlipidemia: Secondary | ICD-10-CM | POA: Diagnosis not present

## 2020-08-03 DIAGNOSIS — I5021 Acute systolic (congestive) heart failure: Secondary | ICD-10-CM | POA: Diagnosis not present

## 2020-08-04 ENCOUNTER — Telehealth: Payer: Self-pay | Admitting: Cardiology

## 2020-08-04 DIAGNOSIS — M479 Spondylosis, unspecified: Secondary | ICD-10-CM | POA: Diagnosis not present

## 2020-08-04 DIAGNOSIS — G5603 Carpal tunnel syndrome, bilateral upper limbs: Secondary | ICD-10-CM | POA: Diagnosis not present

## 2020-08-04 DIAGNOSIS — Z7982 Long term (current) use of aspirin: Secondary | ICD-10-CM | POA: Diagnosis not present

## 2020-08-04 DIAGNOSIS — E782 Mixed hyperlipidemia: Secondary | ICD-10-CM | POA: Diagnosis not present

## 2020-08-04 DIAGNOSIS — Q652 Congenital dislocation of hip, unspecified: Secondary | ICD-10-CM | POA: Diagnosis not present

## 2020-08-04 DIAGNOSIS — I2510 Atherosclerotic heart disease of native coronary artery without angina pectoris: Secondary | ICD-10-CM | POA: Diagnosis not present

## 2020-08-04 DIAGNOSIS — Z7901 Long term (current) use of anticoagulants: Secondary | ICD-10-CM | POA: Diagnosis not present

## 2020-08-04 DIAGNOSIS — M1610 Unilateral primary osteoarthritis, unspecified hip: Secondary | ICD-10-CM | POA: Diagnosis not present

## 2020-08-04 DIAGNOSIS — G629 Polyneuropathy, unspecified: Secondary | ICD-10-CM | POA: Diagnosis not present

## 2020-08-04 DIAGNOSIS — I10 Essential (primary) hypertension: Secondary | ICD-10-CM | POA: Diagnosis not present

## 2020-08-04 DIAGNOSIS — J45909 Unspecified asthma, uncomplicated: Secondary | ICD-10-CM | POA: Diagnosis not present

## 2020-08-04 NOTE — Telephone Encounter (Signed)
Called pt no answer, left vm  

## 2020-08-07 ENCOUNTER — Ambulatory Visit: Payer: Medicare Other | Admitting: Cardiology

## 2020-08-07 ENCOUNTER — Encounter: Payer: Self-pay | Admitting: Cardiology

## 2020-08-07 ENCOUNTER — Other Ambulatory Visit: Payer: Self-pay

## 2020-08-07 VITALS — BP 115/58 | HR 70 | Resp 16 | Ht 59.0 in | Wt 126.0 lb

## 2020-08-07 DIAGNOSIS — I480 Paroxysmal atrial fibrillation: Secondary | ICD-10-CM

## 2020-08-07 DIAGNOSIS — I5020 Unspecified systolic (congestive) heart failure: Secondary | ICD-10-CM

## 2020-08-07 DIAGNOSIS — I25118 Atherosclerotic heart disease of native coronary artery with other forms of angina pectoris: Secondary | ICD-10-CM | POA: Diagnosis not present

## 2020-08-07 MED ORDER — SPIRONOLACTONE 25 MG PO TABS: 25.0000 mg | ORAL_TABLET | ORAL | 0 refills | Status: DC

## 2020-08-07 NOTE — Progress Notes (Signed)
Patient is here for follow up visit.  Subjective:   _0  ID: Holly [name], female    DOB: Sep 06, 1941, 79 y.o.   MRN: [id number]   Chief Complaint  Patient presents with  . Coronary artery disease of native artery of native heart wit  . Follow-up    1 month   . Results    labs     HPI  79 year old African American female with hypertension, dual-chamber pacemaker placement for AV block, moderate, medically managed coronary artery disease (LCx), hypertension, NSVT, HFrEF  Patient is doing well, denies overt dyspnea, leg edema. Weight is staying 126-128 lb. She is unable to split spironolactone in half. Her only complaint today is generalized bodyache.   Current Outpatient Medications on File Prior to Visit  Medication Sig Dispense Refill  . ALPRAZolam (XANAX) 0.25 MG tablet Take 1 tablet by mouth daily as needed for anxiety.     . bisacodyl (DULCOLAX) 5 MG EC tablet Take 5 mg by mouth daily as needed for moderate constipation.    . clobetasol ointment (TEMOVATE) 4.27 % Apply 1 application topically 2 (two) times daily as needed (itching).   1  . ELIQUIS 2.5 MG TABS tablet TAKE 1 TABLET BY MOUTH TWICE A DAY 60 tablet 6  . EUCRISA 2 % OINT Apply 1 application topically daily as needed (dry skin).     Marland Kitchen losartan (COZAAR) 25 MG tablet TAKE 1 TABLET BY MOUTH EVERY DAY 90 tablet 1  . metoprolol succinate (TOPROL-XL) 50 MG 24 hr tablet Take 1 tablet (50 mg total) by mouth daily. Take with or immediately following a meal. 90 tablet 3  . OLOPATADINE HCL OP Place 1 drop into both eyes 2 (two) times daily.    . Probiotic Product (PROBIOTIC PO) Take 1 capsule by mouth daily.     . RABEprazole (ACIPHEX) 20 MG tablet Take 20 mg by mouth in the morning and at bedtime.    . rosuvastatin (CRESTOR) 10 MG tablet Take 1 tablet (10 mg total) by mouth daily. 90 tablet 2  . spironolactone (ALDACTONE) 25 MG tablet Take 0.5 tablets (12.5 mg total) by mouth daily. 30 tablet 2  . torsemide (DEMADEX)  20 MG tablet Monday, Wednesday, Friday (Patient taking differently: Take 20 mg by mouth every Monday, Wednesday, and Friday. ) 30 tablet 1  . nitroGLYCERIN (NITROSTAT) 0.4 MG SL tablet Place 1 tablet (0.4 mg total) under the tongue every 5 (five) minutes as needed for chest pain. 90 tablet 3   No current facility-administered medications on file prior to visit.    Cardiovascular studies:  EKG 06/05/2020: Sinus rhythm with ventricular pacing  Scheduled  In office pacemaker check 07/02/2020:  Single (S)/Dual (D)/BV: D. Presenting A sensed V paced 74 bpm. Pacemaker dependant:  Yes. Underlying V paced at 30 bpm AP 19.8%, VP 99.8%. AMS Episodes 2.  AT/AF burden N/A% . Longest 4 hrs on 6/15 Afib. HVR 6. Longest 13 beat NSVT. Longevity 8.6 Years. Magnet rate: >85%. Lead measurements: Stable. Thoracic impedance: N/A. Histogram: Low (L)/normal (N)/high (H)  Low. No rate response on. Patient activity <1 hr/day.   Observations: As above. Changes: Rate response   Echocardiogram 04/04/2020:  Severely depressed LV systolic function with visual EF 25-30%. Left  ventricle cavity is normal in size. Moderate to severe left ventricular  hypertrophy. No obvious regional wall motion abnormalities. Grade II  diastolic dysfunction, elevated LAP.  Left atrial cavity is severely dilated 82m/m2. Interatrial septum bulges  to the right  suggests elevated LAP. The interatrial Septum is thin and  mobile but appears to be intact by 2D and CF Doppler interrogation.  Endocardial device lead noted in the right cardiac chambers.  Mild to moderate aortic regurgitation.  Moderate (Grade II) mitral regurgitation.  Mild tricuspid regurgitation. No evidence of pulmonary hypertension. RVSP  measures 32 mmHg.  Mild pulmonic regurgitation.  Small pericardial effusion located posteriorly.  Compared to previous study dated 08/06/2018, LVEF is now reduced from 50% to  30% , now Grade 2 DD, and LA is now severely  dilated.   Scheduled Remote pacemaker check  11/01/19: No AHR or VHR episodes. Health trends are normal. Battery longevity is 8.6 years. RA pacing is 15.8 %, RV pacing is 99.9 %. No further NSVT noted on 09/27/2019.   Scheduled  In office pacemaker check 09/23/19:  Single (S)/Dual (D)/BV: D. Presenting AS/VP. Pacemaker dependant:  Yes. Underlying CHB. AP 12.3%, VP 99.7%.  AT/AF burden <0.1% 1 atrial tachycardia episode Longest 00:36 sec. Latest 154 bpm. HVR: NSVT. Longest 00:09. Latest 182 bpm. Longevity 8.7 Years. Lead measurements: Stable. Histogram: Low (L)/normal (N)/high (H)  N. Patient activity <2 hr/day.   Lower extremity venous duplex 08/06/2018: No evidence of deep vein thrombosis of the lower extremities with normal venous return.  Lexiscan myoview stress test 08/04/2017: 1. Non-diagnostic due to paced rhythm. 2. Study quality: good. The left ventricular chamber dimensions are normal. There is medium scar of the inferolateral segment with moderate to severe residual ischemia. There is small scar of the inferior segment with very mild residual ischemia. 3. Systolic function is mildly reduced. The calculated stress EF is at 44 %. Gated SPECT imaging demonstrates hypokinesis in the inferior, lateral segment(s). 5. Intermediate risk study. Clinical correlation recommended.  Coronary angiogram 11/11/2017: LM: Normal LAD: D1 40% stneoses LCx: Tandem 50% stenoses in mid LCx RCA: Normal  Moderate coronary artery disease. She does have abnormal stress test in inferolateral territory, they are out of proportion to the coronary artery findings. Furthermore, she does not have any critical unstable lesion, to explain nonsustained ventricular tachycardia. She does not have any ongoing chest pain symptoms at this time.Recommend aggressive medical management at this time. If true anginal symptoms occur, could then perform PCI to mid circumflex.  Pacemaker Implant 07/16/2017: Medtronic Azure XT DR  MRI Sure Scan (serial number [id number]) dual chamber pacemaker 07/16/2017 by [facility] for complete heart block.  Recent labs: 07/20/2020: NT proBNP 9907  06/05/2020: Glucose 79, BUN/Cr 25/1.29. EGFR 45. Na/K 131/4.5 BNP 1854  04/26/2020: Glucose 97, BUN/Cr 29/1.75. EGFR 31. Na/K 129/4.0.  BNP 2647 (1271 on 04/06/2020)  04/06/2020: BNP 1371  03/22/2020: Glucose 91, BUN/Cr 22/1.18. EGFR 51. Na/K 139/4.1. Calcium 11.7. Rest of the CMP normal H/H 10.5/31.7. MCV 85. Platelets 220 BNP 2896  02/29/2020: Chol 102, TG 52, HDL 49, LDL 52  09/2019: BUN/Cr 33/1.3 HbA1C 5.3% TSH 2.6 normal   Lab Results  Component Value Date   TSH 1.987 07/15/2017    Review of Systems  Cardiovascular: Positive for dyspnea on exertion and leg swelling. Negative for chest pain, palpitations and syncope.         Objective:    Vitals:   08/07/20 1149  BP: (!) 115/58  Pulse: 70  Resp: 16  SpO2: 100%       Physical Exam Vitals and nursing note reviewed.  Constitutional:      General: She is not in acute distress. Neck:     Vascular: No JVD.  Cardiovascular:  Rate and Rhythm: Normal rate and regular rhythm.     Heart sounds: Normal heart sounds. No murmur heard.   Pulmonary:     Effort: Pulmonary effort is normal.     Breath sounds: Normal breath sounds. No wheezing or rales.  Musculoskeletal:     Right lower leg: Edema (Trace) present.     Left lower leg: Edema (Trace) present.           Assessment & Recommendations:   79 year old [place] American female with hypertension, dual-chamber pacemaker placement for AV block, moderate, medically managed coronary artery disease (LCx), hypertension, NSVT, HFrEF  Chronic systolic heart failure: Stable weights, clinically euvolumic, in spite of elevated pro BNP. Take spironolactone 25 mg every other day, alternate with as needed tosemide 20 mg. Continue losartan 25 mg daily, metoprolol succinate 50 mg daily. Did not tolerate Entresto  due to skin rash.  Etiology of heart failure remains unclear.  Differentials include coronary artery disease, paroxysmal atrial fibrillation, RV pacing. She wants to hold off any invasive workup or management at this time. Check BMP today.  Paroxysmal atrial fibrillation: CHA2DS2-VASc score 4, annual stroke risk 5%. Continue eiquis 2.5 mg twice daily.  Stopped aspirin at this time to avoid bleeding risk.  CAD: Continue statin, metoprolol succinate.  NSVT: Continue heart failure management as above  Hypertension:  Controlled.  Repeat echocardiogram in 2-3 weeks. F/u after that   Nigel Mormon, MD [facility] Pager: [phone] Office: [phone] If no answer Cell [phone]

## 2020-08-08 DIAGNOSIS — G629 Polyneuropathy, unspecified: Secondary | ICD-10-CM | POA: Diagnosis not present

## 2020-08-08 DIAGNOSIS — M479 Spondylosis, unspecified: Secondary | ICD-10-CM | POA: Diagnosis not present

## 2020-08-08 DIAGNOSIS — J45909 Unspecified asthma, uncomplicated: Secondary | ICD-10-CM | POA: Diagnosis not present

## 2020-08-08 DIAGNOSIS — G5603 Carpal tunnel syndrome, bilateral upper limbs: Secondary | ICD-10-CM | POA: Diagnosis not present

## 2020-08-08 DIAGNOSIS — E782 Mixed hyperlipidemia: Secondary | ICD-10-CM | POA: Diagnosis not present

## 2020-08-08 DIAGNOSIS — Z7901 Long term (current) use of anticoagulants: Secondary | ICD-10-CM | POA: Diagnosis not present

## 2020-08-08 DIAGNOSIS — I10 Essential (primary) hypertension: Secondary | ICD-10-CM | POA: Diagnosis not present

## 2020-08-08 DIAGNOSIS — M1610 Unilateral primary osteoarthritis, unspecified hip: Secondary | ICD-10-CM | POA: Diagnosis not present

## 2020-08-08 DIAGNOSIS — Z7982 Long term (current) use of aspirin: Secondary | ICD-10-CM | POA: Diagnosis not present

## 2020-08-08 DIAGNOSIS — I2510 Atherosclerotic heart disease of native coronary artery without angina pectoris: Secondary | ICD-10-CM | POA: Diagnosis not present

## 2020-08-08 DIAGNOSIS — Q652 Congenital dislocation of hip, unspecified: Secondary | ICD-10-CM | POA: Diagnosis not present

## 2020-08-09 DIAGNOSIS — Q652 Congenital dislocation of hip, unspecified: Secondary | ICD-10-CM | POA: Diagnosis not present

## 2020-08-09 DIAGNOSIS — G629 Polyneuropathy, unspecified: Secondary | ICD-10-CM | POA: Diagnosis not present

## 2020-08-09 DIAGNOSIS — I10 Essential (primary) hypertension: Secondary | ICD-10-CM | POA: Diagnosis not present

## 2020-08-09 DIAGNOSIS — G5603 Carpal tunnel syndrome, bilateral upper limbs: Secondary | ICD-10-CM | POA: Diagnosis not present

## 2020-08-09 DIAGNOSIS — M1610 Unilateral primary osteoarthritis, unspecified hip: Secondary | ICD-10-CM | POA: Diagnosis not present

## 2020-08-09 DIAGNOSIS — M479 Spondylosis, unspecified: Secondary | ICD-10-CM | POA: Diagnosis not present

## 2020-08-09 DIAGNOSIS — I2510 Atherosclerotic heart disease of native coronary artery without angina pectoris: Secondary | ICD-10-CM | POA: Diagnosis not present

## 2020-08-09 DIAGNOSIS — E782 Mixed hyperlipidemia: Secondary | ICD-10-CM | POA: Diagnosis not present

## 2020-08-09 DIAGNOSIS — Z7982 Long term (current) use of aspirin: Secondary | ICD-10-CM | POA: Diagnosis not present

## 2020-08-09 DIAGNOSIS — Z7901 Long term (current) use of anticoagulants: Secondary | ICD-10-CM | POA: Diagnosis not present

## 2020-08-09 DIAGNOSIS — J45909 Unspecified asthma, uncomplicated: Secondary | ICD-10-CM | POA: Diagnosis not present

## 2020-08-10 DIAGNOSIS — I2510 Atherosclerotic heart disease of native coronary artery without angina pectoris: Secondary | ICD-10-CM | POA: Diagnosis not present

## 2020-08-10 DIAGNOSIS — Q652 Congenital dislocation of hip, unspecified: Secondary | ICD-10-CM | POA: Diagnosis not present

## 2020-08-10 DIAGNOSIS — M1610 Unilateral primary osteoarthritis, unspecified hip: Secondary | ICD-10-CM | POA: Diagnosis not present

## 2020-08-10 DIAGNOSIS — E782 Mixed hyperlipidemia: Secondary | ICD-10-CM | POA: Diagnosis not present

## 2020-08-10 DIAGNOSIS — G5603 Carpal tunnel syndrome, bilateral upper limbs: Secondary | ICD-10-CM | POA: Diagnosis not present

## 2020-08-10 DIAGNOSIS — Z7982 Long term (current) use of aspirin: Secondary | ICD-10-CM | POA: Diagnosis not present

## 2020-08-10 DIAGNOSIS — Z7901 Long term (current) use of anticoagulants: Secondary | ICD-10-CM | POA: Diagnosis not present

## 2020-08-10 DIAGNOSIS — G629 Polyneuropathy, unspecified: Secondary | ICD-10-CM | POA: Diagnosis not present

## 2020-08-10 DIAGNOSIS — J45909 Unspecified asthma, uncomplicated: Secondary | ICD-10-CM | POA: Diagnosis not present

## 2020-08-10 DIAGNOSIS — M479 Spondylosis, unspecified: Secondary | ICD-10-CM | POA: Diagnosis not present

## 2020-08-10 DIAGNOSIS — I10 Essential (primary) hypertension: Secondary | ICD-10-CM | POA: Diagnosis not present

## 2020-08-11 DIAGNOSIS — E782 Mixed hyperlipidemia: Secondary | ICD-10-CM | POA: Diagnosis not present

## 2020-08-11 DIAGNOSIS — Z7901 Long term (current) use of anticoagulants: Secondary | ICD-10-CM | POA: Diagnosis not present

## 2020-08-11 DIAGNOSIS — Z7982 Long term (current) use of aspirin: Secondary | ICD-10-CM | POA: Diagnosis not present

## 2020-08-11 DIAGNOSIS — I2510 Atherosclerotic heart disease of native coronary artery without angina pectoris: Secondary | ICD-10-CM | POA: Diagnosis not present

## 2020-08-11 DIAGNOSIS — Q652 Congenital dislocation of hip, unspecified: Secondary | ICD-10-CM | POA: Diagnosis not present

## 2020-08-11 DIAGNOSIS — M479 Spondylosis, unspecified: Secondary | ICD-10-CM | POA: Diagnosis not present

## 2020-08-11 DIAGNOSIS — G629 Polyneuropathy, unspecified: Secondary | ICD-10-CM | POA: Diagnosis not present

## 2020-08-11 DIAGNOSIS — I10 Essential (primary) hypertension: Secondary | ICD-10-CM | POA: Diagnosis not present

## 2020-08-11 DIAGNOSIS — J45909 Unspecified asthma, uncomplicated: Secondary | ICD-10-CM | POA: Diagnosis not present

## 2020-08-11 DIAGNOSIS — G5603 Carpal tunnel syndrome, bilateral upper limbs: Secondary | ICD-10-CM | POA: Diagnosis not present

## 2020-08-11 DIAGNOSIS — M1610 Unilateral primary osteoarthritis, unspecified hip: Secondary | ICD-10-CM | POA: Diagnosis not present

## 2020-08-16 DIAGNOSIS — M479 Spondylosis, unspecified: Secondary | ICD-10-CM | POA: Diagnosis not present

## 2020-08-16 DIAGNOSIS — Q652 Congenital dislocation of hip, unspecified: Secondary | ICD-10-CM | POA: Diagnosis not present

## 2020-08-16 DIAGNOSIS — Z7901 Long term (current) use of anticoagulants: Secondary | ICD-10-CM | POA: Diagnosis not present

## 2020-08-16 DIAGNOSIS — G629 Polyneuropathy, unspecified: Secondary | ICD-10-CM | POA: Diagnosis not present

## 2020-08-16 DIAGNOSIS — J45909 Unspecified asthma, uncomplicated: Secondary | ICD-10-CM | POA: Diagnosis not present

## 2020-08-16 DIAGNOSIS — I2510 Atherosclerotic heart disease of native coronary artery without angina pectoris: Secondary | ICD-10-CM | POA: Diagnosis not present

## 2020-08-16 DIAGNOSIS — I10 Essential (primary) hypertension: Secondary | ICD-10-CM | POA: Diagnosis not present

## 2020-08-16 DIAGNOSIS — Z7982 Long term (current) use of aspirin: Secondary | ICD-10-CM | POA: Diagnosis not present

## 2020-08-16 DIAGNOSIS — E782 Mixed hyperlipidemia: Secondary | ICD-10-CM | POA: Diagnosis not present

## 2020-08-16 DIAGNOSIS — M1610 Unilateral primary osteoarthritis, unspecified hip: Secondary | ICD-10-CM | POA: Diagnosis not present

## 2020-08-16 DIAGNOSIS — G5603 Carpal tunnel syndrome, bilateral upper limbs: Secondary | ICD-10-CM | POA: Diagnosis not present

## 2020-08-18 DIAGNOSIS — M1610 Unilateral primary osteoarthritis, unspecified hip: Secondary | ICD-10-CM | POA: Diagnosis not present

## 2020-08-18 DIAGNOSIS — M479 Spondylosis, unspecified: Secondary | ICD-10-CM | POA: Diagnosis not present

## 2020-08-18 DIAGNOSIS — G629 Polyneuropathy, unspecified: Secondary | ICD-10-CM | POA: Diagnosis not present

## 2020-08-18 DIAGNOSIS — J45909 Unspecified asthma, uncomplicated: Secondary | ICD-10-CM | POA: Diagnosis not present

## 2020-08-18 DIAGNOSIS — I2510 Atherosclerotic heart disease of native coronary artery without angina pectoris: Secondary | ICD-10-CM | POA: Diagnosis not present

## 2020-08-18 DIAGNOSIS — Q652 Congenital dislocation of hip, unspecified: Secondary | ICD-10-CM | POA: Diagnosis not present

## 2020-08-18 DIAGNOSIS — G5603 Carpal tunnel syndrome, bilateral upper limbs: Secondary | ICD-10-CM | POA: Diagnosis not present

## 2020-08-18 DIAGNOSIS — E782 Mixed hyperlipidemia: Secondary | ICD-10-CM | POA: Diagnosis not present

## 2020-08-18 DIAGNOSIS — I10 Essential (primary) hypertension: Secondary | ICD-10-CM | POA: Diagnosis not present

## 2020-08-18 DIAGNOSIS — Z7901 Long term (current) use of anticoagulants: Secondary | ICD-10-CM | POA: Diagnosis not present

## 2020-08-18 DIAGNOSIS — Z7982 Long term (current) use of aspirin: Secondary | ICD-10-CM | POA: Diagnosis not present

## 2020-08-21 DIAGNOSIS — I10 Essential (primary) hypertension: Secondary | ICD-10-CM | POA: Diagnosis not present

## 2020-08-21 DIAGNOSIS — G629 Polyneuropathy, unspecified: Secondary | ICD-10-CM | POA: Diagnosis not present

## 2020-08-21 DIAGNOSIS — G5603 Carpal tunnel syndrome, bilateral upper limbs: Secondary | ICD-10-CM | POA: Diagnosis not present

## 2020-08-21 DIAGNOSIS — M479 Spondylosis, unspecified: Secondary | ICD-10-CM | POA: Diagnosis not present

## 2020-08-21 DIAGNOSIS — M1610 Unilateral primary osteoarthritis, unspecified hip: Secondary | ICD-10-CM | POA: Diagnosis not present

## 2020-08-21 DIAGNOSIS — E782 Mixed hyperlipidemia: Secondary | ICD-10-CM | POA: Diagnosis not present

## 2020-08-21 DIAGNOSIS — J45909 Unspecified asthma, uncomplicated: Secondary | ICD-10-CM | POA: Diagnosis not present

## 2020-08-21 DIAGNOSIS — I2510 Atherosclerotic heart disease of native coronary artery without angina pectoris: Secondary | ICD-10-CM | POA: Diagnosis not present

## 2020-08-21 DIAGNOSIS — Z7982 Long term (current) use of aspirin: Secondary | ICD-10-CM | POA: Diagnosis not present

## 2020-08-21 DIAGNOSIS — Q652 Congenital dislocation of hip, unspecified: Secondary | ICD-10-CM | POA: Diagnosis not present

## 2020-08-21 DIAGNOSIS — Z7901 Long term (current) use of anticoagulants: Secondary | ICD-10-CM | POA: Diagnosis not present

## 2020-08-22 ENCOUNTER — Ambulatory Visit: Payer: Medicare Other

## 2020-08-22 ENCOUNTER — Other Ambulatory Visit: Payer: Self-pay

## 2020-08-22 DIAGNOSIS — M1610 Unilateral primary osteoarthritis, unspecified hip: Secondary | ICD-10-CM | POA: Diagnosis not present

## 2020-08-22 DIAGNOSIS — I5020 Unspecified systolic (congestive) heart failure: Secondary | ICD-10-CM | POA: Diagnosis not present

## 2020-08-22 DIAGNOSIS — G5603 Carpal tunnel syndrome, bilateral upper limbs: Secondary | ICD-10-CM | POA: Diagnosis not present

## 2020-08-22 DIAGNOSIS — I10 Essential (primary) hypertension: Secondary | ICD-10-CM | POA: Diagnosis not present

## 2020-08-22 DIAGNOSIS — E782 Mixed hyperlipidemia: Secondary | ICD-10-CM | POA: Diagnosis not present

## 2020-08-22 DIAGNOSIS — G629 Polyneuropathy, unspecified: Secondary | ICD-10-CM | POA: Diagnosis not present

## 2020-08-22 DIAGNOSIS — Z7982 Long term (current) use of aspirin: Secondary | ICD-10-CM | POA: Diagnosis not present

## 2020-08-22 DIAGNOSIS — J45909 Unspecified asthma, uncomplicated: Secondary | ICD-10-CM | POA: Diagnosis not present

## 2020-08-22 DIAGNOSIS — M479 Spondylosis, unspecified: Secondary | ICD-10-CM | POA: Diagnosis not present

## 2020-08-22 DIAGNOSIS — Z7901 Long term (current) use of anticoagulants: Secondary | ICD-10-CM | POA: Diagnosis not present

## 2020-08-22 DIAGNOSIS — I2510 Atherosclerotic heart disease of native coronary artery without angina pectoris: Secondary | ICD-10-CM | POA: Diagnosis not present

## 2020-08-22 DIAGNOSIS — Q652 Congenital dislocation of hip, unspecified: Secondary | ICD-10-CM | POA: Diagnosis not present

## 2020-08-23 NOTE — Progress Notes (Signed)
I would like to see her sooner to follow up on her echocardiogram.  Thanks MJP

## 2020-08-24 DIAGNOSIS — G5603 Carpal tunnel syndrome, bilateral upper limbs: Secondary | ICD-10-CM | POA: Diagnosis not present

## 2020-08-24 DIAGNOSIS — G629 Polyneuropathy, unspecified: Secondary | ICD-10-CM | POA: Diagnosis not present

## 2020-08-24 DIAGNOSIS — J45909 Unspecified asthma, uncomplicated: Secondary | ICD-10-CM | POA: Diagnosis not present

## 2020-08-24 DIAGNOSIS — M479 Spondylosis, unspecified: Secondary | ICD-10-CM | POA: Diagnosis not present

## 2020-08-24 DIAGNOSIS — Q652 Congenital dislocation of hip, unspecified: Secondary | ICD-10-CM | POA: Diagnosis not present

## 2020-08-24 DIAGNOSIS — Z7901 Long term (current) use of anticoagulants: Secondary | ICD-10-CM | POA: Diagnosis not present

## 2020-08-24 DIAGNOSIS — I10 Essential (primary) hypertension: Secondary | ICD-10-CM | POA: Diagnosis not present

## 2020-08-24 DIAGNOSIS — I2510 Atherosclerotic heart disease of native coronary artery without angina pectoris: Secondary | ICD-10-CM | POA: Diagnosis not present

## 2020-08-24 DIAGNOSIS — Z7982 Long term (current) use of aspirin: Secondary | ICD-10-CM | POA: Diagnosis not present

## 2020-08-24 DIAGNOSIS — E782 Mixed hyperlipidemia: Secondary | ICD-10-CM | POA: Diagnosis not present

## 2020-08-24 DIAGNOSIS — M1610 Unilateral primary osteoarthritis, unspecified hip: Secondary | ICD-10-CM | POA: Diagnosis not present

## 2020-08-25 ENCOUNTER — Other Ambulatory Visit (HOSPITAL_COMMUNITY): Payer: Self-pay | Admitting: Cardiology

## 2020-08-25 DIAGNOSIS — I5020 Unspecified systolic (congestive) heart failure: Secondary | ICD-10-CM | POA: Diagnosis not present

## 2020-08-26 LAB — BASIC METABOLIC PANEL
BUN/Creatinine Ratio: 27 (ref 12–28)
BUN: 37 mg/dL — ABNORMAL HIGH (ref 8–27)
CO2: 20 mmol/L (ref 20–29)
Calcium: 10.4 mg/dL — ABNORMAL HIGH (ref 8.7–10.3)
Chloride: 98 mmol/L (ref 96–106)
Creatinine, Ser: 1.36 mg/dL — ABNORMAL HIGH (ref 0.57–1.00)
GFR calc Af Amer: 42 mL/min/{1.73_m2} — ABNORMAL LOW (ref >59)
GFR calc non Af Amer: 37 mL/min/{1.73_m2} — ABNORMAL LOW (ref >59)
Glucose: 109 mg/dL — ABNORMAL HIGH (ref 65–99)
Potassium: 4.8 mmol/L (ref 3.5–5.2)
Sodium: 132 mmol/L — ABNORMAL LOW (ref 134–144)

## 2020-08-28 ENCOUNTER — Encounter: Payer: Self-pay | Admitting: Cardiology

## 2020-08-28 ENCOUNTER — Other Ambulatory Visit: Payer: Self-pay

## 2020-08-28 ENCOUNTER — Ambulatory Visit: Payer: Medicare Other | Admitting: Cardiology

## 2020-08-28 VITALS — BP 119/72 | HR 76 | Resp 16 | Ht 59.0 in | Wt 134.0 lb

## 2020-08-28 DIAGNOSIS — Z7982 Long term (current) use of aspirin: Secondary | ICD-10-CM | POA: Diagnosis not present

## 2020-08-28 DIAGNOSIS — I5020 Unspecified systolic (congestive) heart failure: Secondary | ICD-10-CM | POA: Diagnosis not present

## 2020-08-28 DIAGNOSIS — E782 Mixed hyperlipidemia: Secondary | ICD-10-CM | POA: Diagnosis not present

## 2020-08-28 DIAGNOSIS — I25118 Atherosclerotic heart disease of native coronary artery with other forms of angina pectoris: Secondary | ICD-10-CM | POA: Diagnosis not present

## 2020-08-28 DIAGNOSIS — I2510 Atherosclerotic heart disease of native coronary artery without angina pectoris: Secondary | ICD-10-CM | POA: Diagnosis not present

## 2020-08-28 DIAGNOSIS — Q652 Congenital dislocation of hip, unspecified: Secondary | ICD-10-CM | POA: Diagnosis not present

## 2020-08-28 DIAGNOSIS — I442 Atrioventricular block, complete: Secondary | ICD-10-CM

## 2020-08-28 DIAGNOSIS — J45909 Unspecified asthma, uncomplicated: Secondary | ICD-10-CM | POA: Diagnosis not present

## 2020-08-28 DIAGNOSIS — M1610 Unilateral primary osteoarthritis, unspecified hip: Secondary | ICD-10-CM | POA: Diagnosis not present

## 2020-08-28 DIAGNOSIS — I10 Essential (primary) hypertension: Secondary | ICD-10-CM

## 2020-08-28 DIAGNOSIS — M479 Spondylosis, unspecified: Secondary | ICD-10-CM | POA: Diagnosis not present

## 2020-08-28 DIAGNOSIS — Z7901 Long term (current) use of anticoagulants: Secondary | ICD-10-CM | POA: Diagnosis not present

## 2020-08-28 DIAGNOSIS — G5603 Carpal tunnel syndrome, bilateral upper limbs: Secondary | ICD-10-CM | POA: Diagnosis not present

## 2020-08-28 DIAGNOSIS — G629 Polyneuropathy, unspecified: Secondary | ICD-10-CM | POA: Diagnosis not present

## 2020-08-28 NOTE — Progress Notes (Signed)
Patient is here for follow up visit.  Subjective:   _0  ID: Holly [name], female    DOB: 09-06-1941, 79 y.o.   MRN: [id number]   Chief Complaint  Patient presents with  . Coronary Artery Disease  . Follow-up    6 month    HPI  79 year old African American female with hypertension, dual-chamber pacemaker placement for AV block, moderate, medically managed coronary artery disease (LCx), hypertension, NSVT, HFrEF  Patient denies any obvious dyspnea, on further questioning, she clearly reports exertional dyspnea with more than usual activity.  For her, this includes minimal walking at home.  She has noticed mild leg edema.  Denies any chest pain.  She has experienced occasional dizziness, but denies any presyncope or syncope.   Current Outpatient Medications on File Prior to Visit  Medication Sig Dispense Refill  . ALPRAZolam (XANAX) 0.25 MG tablet Take 1 tablet by mouth daily as needed for anxiety.     . bisacodyl (DULCOLAX) 5 MG EC tablet Take 5 mg by mouth daily as needed for moderate constipation.    . clobetasol ointment (TEMOVATE) 8.58 % Apply 1 application topically 2 (two) times daily as needed (itching).   1  . ELIQUIS 2.5 MG TABS tablet TAKE 1 TABLET BY MOUTH TWICE A DAY 60 tablet 6  . EUCRISA 2 % OINT Apply 1 application topically daily as needed (dry skin).     Marland Kitchen losartan (COZAAR) 25 MG tablet TAKE 1 TABLET BY MOUTH EVERY DAY 90 tablet 1  . metoprolol succinate (TOPROL-XL) 50 MG 24 hr tablet Take 1 tablet (50 mg total) by mouth daily. Take with or immediately following a meal. 90 tablet 3  . nitroGLYCERIN (NITROSTAT) 0.4 MG SL tablet Place 1 tablet (0.4 mg total) under the tongue every 5 (five) minutes as needed for chest pain. 90 tablet 3  . OLOPATADINE HCL OP Place 1 drop into both eyes 2 (two) times daily.    . Probiotic Product (PROBIOTIC PO) Take 1 capsule by mouth daily.     . RABEprazole (ACIPHEX) 20 MG tablet Take 20 mg by mouth in the morning and at  bedtime.    . rosuvastatin (CRESTOR) 10 MG tablet Take 1 tablet (10 mg total) by mouth daily. 90 tablet 2  . spironolactone (ALDACTONE) 25 MG tablet Take 1 tablet (25 mg total) by mouth every other day. 1 tablet 0  . torsemide (DEMADEX) 20 MG tablet Monday, Wednesday, Friday (Patient taking differently: Take 20 mg by mouth every Monday, Wednesday, and Friday. ) 30 tablet 1   No current facility-administered medications on file prior to visit.    Cardiovascular studies:  Echocardiogram 08/22/2020:  Left ventricle cavity is moderately dilated. Mild concentric hypertrophy  of the left ventricle. Abnormal septal wall motion due to right ventricle  pacemaker. Severe global hypokinesis. LVEF 25-30%. Diastolic function not  assessed due to paced rhythm.   Left atrial cavity is severely dilated.  Right atrial cavity is mildly dilated.  Structurally normal trileaflet aortic valve. Mild (Grade I) aortic  regurgitation.  Moderate to severe mitral regurgitation.  Moderate tricuspid regurgitation.  No evidence of pulmonary hypertension.  Compared to previous study on 04/05/2019, there is further progression of  mitral and tricuspid valve regurgitation.   EKG 06/05/2020: Sinus rhythm with ventricular pacing  Scheduled Remote pacemaker check  07/31/2020:  There were 0 atrial high rate episodes detected. There were 0 high ventricular rate episodes detected. Health trends do not demonstrate significant abnormality. Battery longevity is  7.8 years. RA pacing is 64.5 %, RV pacing is 99.1 %. Atrial unipolar lead impedance warning currently at 380 Ohms. Stable trend from previous on 07/04/20 and will continue to observe.  Lead trends over time is very stable.   Scheduled  In office pacemaker check 07/02/2020: Single (S)/Dual (D)/BV: D. Presenting A sensed V paced 74 bpm. Pacemaker dependant:  Yes. Underlying V paced at 30 bpm AP 19.8%, VP 99.8%. AMS Episodes 2.  AT/AF burden N/A% . Longest 4 hrs on 6/15  Afib. HVR 6. Longest 13 beat NSVT. Longevity 8.6 Years. Magnet rate: >85%. Lead measurements: Stable. Thoracic impedance: N/A. Histogram: Low (L)/normal (N)/high (H)  Low. No rate response on. Patient activity <1 hr/day.   Observations: As above. Changes: Rate response   Lower extremity venous duplex 08/06/2018:  No evidence of deep vein thrombosis of the lower extremities with normal venous return.  Lexiscan myoview stress test 08/04/2017:  1. Non-diagnostic due to paced rhythm. 2. Study quality: good. The left ventricular chamber dimensions are normal. There is medium scar of the inferolateral segment with moderate to severe residual ischemia. There is small scar of the inferior segment with very mild residual ischemia. 3. Systolic function is mildly reduced. The calculated stress EF is at 44 %. Gated SPECT imaging demonstrates hypokinesis in the inferior, lateral segment(s). 5. Intermediate risk study. Clinical correlation recommended.  Coronary angiogram 11/11/2017: LM: Normal LAD: D1 40% stneoses LCx: Tandem 50% stenoses in mid LCx RCA: Normal  Moderate coronary artery disease. She does have abnormal stress test in inferolateral territory, they are out of proportion to the coronary artery findings. Furthermore, she does not have any critical unstable lesion, to explain nonsustained ventricular tachycardia. She does not have any ongoing chest pain symptoms at this time.Recommend aggressive medical management at this time. If true anginal symptoms occur, could then perform PCI to mid circumflex.  Pacemaker Implant 07/16/2017:  Medtronic Azure XT DR MRI Sure Scan (serial number [id number]) dual chamber pacemaker 07/16/2017 by [facility] for complete heart block.  Recent labs: 08/25/2020: Glucose 109, BUN/Cr 37/1.36. EGFR 37. Na/K 132/4.8.   07/20/2020: NT proBNP 9907  06/05/2020: Glucose 79, BUN/Cr 25/1.29. EGFR 45. Na/K 131/4.5 BNP 1854  04/26/2020: Glucose 97, BUN/Cr 29/1.75. EGFR  31. Na/K 129/4.0.  BNP 2647 (1271 on 04/06/2020)  04/06/2020: BNP 1371  03/22/2020: Glucose 91, BUN/Cr 22/1.18. EGFR 51. Na/K 139/4.1. Calcium 11.7. Rest of the CMP normal H/H 10.5/31.7. MCV 85. Platelets 220 BNP 2896  02/29/2020: Chol 102, TG 52, HDL 49, LDL 52  09/2019: BUN/Cr 33/1.3 HbA1C 5.3% TSH 2.6 normal   Lab Results  Component Value Date   TSH 1.987 07/15/2017    Review of Systems  Cardiovascular: Positive for dyspnea on exertion and leg swelling. Negative for chest pain, palpitations and syncope.         Objective:    Vitals:   08/28/20 1000  BP: 119/72  Pulse: 76  Resp: 16  SpO2: 100%       Physical Exam Vitals and nursing note reviewed.  Constitutional:      General: She is not in acute distress. Neck:     Vascular: No JVD.  Cardiovascular:     Rate and Rhythm: Normal rate and regular rhythm.     Heart sounds: Normal heart sounds. No murmur heard.   Pulmonary:     Effort: Pulmonary effort is normal.     Breath sounds: Normal breath sounds. No wheezing or rales.  Musculoskeletal:     Right  lower leg: Edema (Trace) present.     Left lower leg: Edema (Trace) present.           Assessment & Recommendations:   79 year old [place] American female with hypertension, dual-chamber pacemaker placement for AV block, moderate, medically managed coronary artery disease (LCx), hypertension, NSVT, HFrEF  Chronic systolic heart failure: Progression of NYHA symptoms, now class II-III. Serial echocardiograms showed further decline in her EF as well as worsening valvular regurgitation and chamber dilation. Her etiology for systolic heart failure remains unclear, differentials include obstructive coronary artery disease, proximal ascending atrial fibrillation, as well as near 100% RV pacing. Patient has wanted to monitor for invasive procedures in the past, further discussion today with her and her daughter led to mutual decision, referral to EP. She does  have known moderate CAD which has been medically managed.  I am happy to perform coronary angiography before or after CRT placement. Irrespective of presence of obstructive CAD, I think she will benefit from BiV pacing and cardiac resynchronization therapy. Take spironolactone 25 mg every other day, alternate with as needed tosemide 20 mg. Continue losartan 25 mg daily, metoprolol succinate 50 mg daily.  Did not tolerate Entresto due to skin rash.   Paroxysmal atrial fibrillation: CHA2DS2-VASc score 4, annual stroke risk 5%. Continue eiquis 2.5 mg twice daily.  Stopped aspirin at this time to avoid bleeding risk.  CAD: Continue statin, metoprolol succinate.  NSVT: Continue heart failure management as above  Hypertension:  Controlled.  F/u in 4 weeks  [place], MD [facility] Pager: [phone] Office: [phone] If no answer Cell [phone]

## 2020-08-29 ENCOUNTER — Institutional Professional Consult (permissible substitution): Payer: Medicare Other | Admitting: Cardiology

## 2020-08-30 DIAGNOSIS — J45909 Unspecified asthma, uncomplicated: Secondary | ICD-10-CM | POA: Diagnosis not present

## 2020-08-30 DIAGNOSIS — Z7901 Long term (current) use of anticoagulants: Secondary | ICD-10-CM | POA: Diagnosis not present

## 2020-08-30 DIAGNOSIS — I2510 Atherosclerotic heart disease of native coronary artery without angina pectoris: Secondary | ICD-10-CM | POA: Diagnosis not present

## 2020-08-30 DIAGNOSIS — Z7982 Long term (current) use of aspirin: Secondary | ICD-10-CM | POA: Diagnosis not present

## 2020-08-30 DIAGNOSIS — I10 Essential (primary) hypertension: Secondary | ICD-10-CM | POA: Diagnosis not present

## 2020-08-30 DIAGNOSIS — M479 Spondylosis, unspecified: Secondary | ICD-10-CM | POA: Diagnosis not present

## 2020-08-30 DIAGNOSIS — M1610 Unilateral primary osteoarthritis, unspecified hip: Secondary | ICD-10-CM | POA: Diagnosis not present

## 2020-08-30 DIAGNOSIS — G5603 Carpal tunnel syndrome, bilateral upper limbs: Secondary | ICD-10-CM | POA: Diagnosis not present

## 2020-08-30 DIAGNOSIS — Q652 Congenital dislocation of hip, unspecified: Secondary | ICD-10-CM | POA: Diagnosis not present

## 2020-08-30 DIAGNOSIS — E782 Mixed hyperlipidemia: Secondary | ICD-10-CM | POA: Diagnosis not present

## 2020-08-30 DIAGNOSIS — G629 Polyneuropathy, unspecified: Secondary | ICD-10-CM | POA: Diagnosis not present

## 2020-09-03 DIAGNOSIS — I5021 Acute systolic (congestive) heart failure: Secondary | ICD-10-CM | POA: Diagnosis not present

## 2020-09-05 ENCOUNTER — Ambulatory Visit (INDEPENDENT_AMBULATORY_CARE_PROVIDER_SITE_OTHER): Payer: Medicare Other | Admitting: Cardiology

## 2020-09-05 ENCOUNTER — Other Ambulatory Visit: Payer: Self-pay

## 2020-09-05 ENCOUNTER — Encounter: Payer: Self-pay | Admitting: Cardiology

## 2020-09-05 VITALS — BP 102/60 | HR 72 | Ht 59.0 in | Wt 133.4 lb

## 2020-09-05 DIAGNOSIS — I5020 Unspecified systolic (congestive) heart failure: Secondary | ICD-10-CM

## 2020-09-05 DIAGNOSIS — I480 Paroxysmal atrial fibrillation: Secondary | ICD-10-CM

## 2020-09-05 DIAGNOSIS — I10 Essential (primary) hypertension: Secondary | ICD-10-CM

## 2020-09-05 DIAGNOSIS — I442 Atrioventricular block, complete: Secondary | ICD-10-CM

## 2020-09-05 NOTE — Patient Instructions (Addendum)
Medication Instructions:  Your physician recommends that you continue on your current medications as directed. Please refer to the Current Medication list given to you today.  Labwork: None ordered.  Testing/Procedures: None ordered.  Follow-Up:  SEE INSTRUCTION LETTER  Any Other Special Instructions Will Be Listed Below (If Applicable).  If you need a refill on your cardiac medications before your next appointment, please call your pharmacy.    Biventricular Pacemaker Implantation A biventricular pacemaker implantation is a procedure to place (implant) a pacemaker near the heart. A pacemaker is a small, battery-powered device that helps control the heartbeat. If the heart beats irregularly or too slowly (bradycardia), the pacemaker will pace the heart so that it beats at a normal rate or a programmed rate. The parts of a biventricular pacemaker include:  The pulse generator. The pulse generator contains a small computer and a memory system that is programmed to keep the heart beating at a certain rate. The pulse generator also produces the electrical signal that triggers the heart to beat. This is implanted under the skin of the upper chest, near the collarbone.  Wires (leads). There may be two or three leads placed in the heart--one to the right atrium, one to the right ventricle, and one through the coronary sinus to reach the left ventricle of the heart. The leads are connected to the pulse generator. They transmit electrical pulses from the pulse generator to the heart. A biventricular pacemaker is used in people with heart failure due to weak heart muscles. The pacemaker can help the heart chambers pump more efficiently. This procedure may be done to treat:  Symptoms of severe heart failure, such as shortness of breath (dyspnea).  Loss of consciousness that happens repeatedly (syncope) because of an irregular heart rate. Tell a health care provider about:  Any allergies you  have.  All medicines you are taking, including vitamins, herbs, eye drops, creams, and over-the-counter medicines.  Any problems you or family members have had with anesthetic medicines.  Any blood disorders you have.  Any surgeries you have had.  Any medical conditions you have.  Whether you are pregnant or may be pregnant. What are the risks? Generally, this is a safe procedure. However, problems may occur, including:  Infection.  Swelling, bruising, or bleeding at the pacemaker site, especially if you take blood thinners.  Allergic reactions to medicines or dyes.  Damage to blood vessels or nerves near the pacemaker.  Failure of the pacemaker to improve your condition.  Collapsed lung.  Blood clots.  Lead failures. This may require more surgery. What happens before the procedure? Medicines Ask your health care provider about:  Changing or stopping your regular medicines. This is especially important if you are taking diabetes medicines or blood thinners.  Taking medicines such as aspirin and ibuprofen. These medicines can thin your blood. Do not take these medicines unless your health care provider tells you to take them.  Taking over-the-counter medicines, vitamins, herbs, and supplements. Eating and drinking Follow instructions from your health care provider about eating and drinking, which may include:  8 hours before the procedure - stop eating heavy meals or foods, such as meat, fried foods, or fatty foods.  6 hours before the procedure - stop eating light meals or foods, such as toast or cereal.  6 hours before the procedure - stop drinking milk or drinks that contain milk.  2 hours before the procedure - stop drinking clear liquids. Staying hydrated Follow instructions from your health   care provider about hydration, which may include:  Up to 2 hours before the procedure - you may continue to drink clear liquids, such as water, clear fruit juice, black  coffee, and plain tea.  General instructions  Do not use any products that contain nicotine or tobacco for at least 4 weeks before the procedure. These products include cigarettes, e-cigarettes, and chewing tobacco. If you need help quitting, ask your health care provider.  You may have tests, including: ? Blood tests. ? Chest X-rays. ? Echocardiogram. This is a test that uses sound waves (ultrasound) to produce an image of the heart.  Ask your health care provider: ? How your surgery site will be marked. ? What steps will be taken to help prevent infection. These may include:  Removing hair at the surgery site.  Washing skin with a germ-killing soap.  Taking antibiotic medicine.  Plan to have someone take you home from the hospital or clinic.  If you will be going home right after the procedure, plan to have someone with you for 24 hours. What happens during the procedure?   An IV will be inserted into one of your veins.  You will be connected to a heart monitor. Large electrode pads will be placed on the front and back of your chest.  You will be given one or more of the following: ? A medicine to help you relax (sedative). ? A medicine to make you fall asleep (general anesthetic). ? A medicine that is injected into an area of your body to numb the area (local anesthetic).  An incision will be made in your upper chest, near your heart.  The leads will be guided into your incision, through your blood vessels, and into your heart. Your health care provider will use an X-ray machine (fluoroscope) to guide the leads into your heart.  The leads will be attached to your heart muscles and to the pulse generator.  The leads will be tested to make sure that they work correctly.  The pulse generator will be implanted under your skin, near your incision.  The heart monitor will be watched to ensure that the pacer is working correctly.  Your incision will be closed with stitches  (sutures), skin glue, or adhesive tape.  A bandage (dressing) will be placed over your incision. The procedure may vary among health care providers and hospitals. What happens after the procedure?  Your blood pressure, heart rate, breathing rate, and blood oxygen level will be monitored until you leave the hospital or clinic.  You may continue to receive fluids and medicines through an IV.  You will be given pain medicine as needed.  You will have a chest X-ray done. This is to make sure that your pacemaker is in the right place.  You will be given a pacemaker identification card. This card lists the implant date, device model, and manufacturer of your pacemaker.  Do not drive for 24 hours if you were given a sedative during your procedure. Summary  A pacemaker is a small, battery-powered device that helps control the heartbeat.  A biventricular pacemaker is used in people with heart failure due to weak heart muscles.  Follow instructions from your health care provider about taking medicines and about eating and drinking before the procedure.  You will be given a pacemaker identification card that lists the implant date, device model, and manufacturer of your pacemaker. This information is not intended to replace advice given to you by your health care   provider. Make sure you discuss any questions you have with your health care provider. Document Revised: 09/23/2018 Document Reviewed: 09/23/2018 [employer] Patient Education  2020 [employer]   

## 2020-09-05 NOTE — Progress Notes (Addendum)
Electrophysiology Office Note:    Date:  09/05/2020   ID:  [name], DOB 21-Nov-1940, MRN [id number]  PCP:  Merrilee Seashore, MD  [facility] Cardiologist:  No primary care provider on file.  [facility] Electrophysiologist:  None   Referring MD: Merrilee Seashore, MD   Chief Complaint: Chronic systolic heart failure  History of Present Illness:    Holly [name] is a 79 y.o. female who presents for an evaluation of chronic systolic heart failure at the request of [name]. Their medical history includes complete heart block, hypertension, dual-chamber permanent pacemaker in situ. She was last seen by [name] on August 28, 2020. At that appointment a progressive decline in her left ventricular function and worsening of her NYHA classification symptoms as noted. She was 100% RV pacing.   Patient tells me that she gets around her house okay but has noticed shortness of breath and fatigue with exertion.  No syncope or presyncope.  Past Medical History:  Diagnosis Date  . AV block, 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[place]) 07/15/2017  . Bronchitis   . Encounter for care of pacemaker 05/12/2019  . Hypertension   . Pacemaker - MRI safe Medtronic Azure XT DR MRI [id number] Dual chamber PPM in situ 04/24/2019    Past Surgical History:  Procedure Laterality Date  . LEFT HEART CATH AND CORONARY ANGIOGRAPHY N/A 11/11/2017   Procedure: LEFT HEART CATH AND CORONARY ANGIOGRAPHY;  Surgeon: Nigel Mormon, MD;  Location: [place] [facility];  Service: Cardiovascular;  Laterality: N/A;  . left leg surgery    . PACEMAKER IMPLANT N/A 07/15/2017   Procedure: Pacemaker Implant;  Surgeon: Constance Haw, MD;  Location: [place] [facility];  Service: Cardiovascular;  Laterality: N/A;    Current Medications: Current Meds  Medication Sig  . ALPRAZolam (XANAX) 0.25 MG tablet Take 1 tablet by mouth daily as needed for anxiety.   . bisacodyl (DULCOLAX) 5 MG EC tablet Take 5 mg by mouth daily as  needed for moderate constipation.  . clobetasol ointment (TEMOVATE) 4.09 % Apply 1 application topically 2 (two) times daily as needed (itching).   Marland Kitchen ELIQUIS 2.5 MG TABS tablet TAKE 1 TABLET BY MOUTH TWICE A DAY  . EUCRISA 2 % OINT Apply 1 application topically daily as needed (dry skin).   Marland Kitchen losartan (COZAAR) 25 MG tablet TAKE 1 TABLET BY MOUTH EVERY DAY  . metoprolol succinate (TOPROL-XL) 50 MG 24 hr tablet Take 1 tablet (50 mg total) by mouth daily. Take with or immediately following a meal.  . OLOPATADINE HCL OP Place 1 drop into both eyes 2 (two) times daily.  . Probiotic Product (PROBIOTIC PO) Take 1 capsule by mouth daily.   . RABEprazole (ACIPHEX) 20 MG tablet Take 20 mg by mouth in the morning and at bedtime.  . rosuvastatin (CRESTOR) 10 MG tablet Take 1 tablet (10 mg total) by mouth daily.  Marland Kitchen spironolactone (ALDACTONE) 25 MG tablet Take 1 tablet (25 mg total) by mouth every other day.  . torsemide (DEMADEX) 20 MG tablet Monday, Wednesday, Friday (Patient taking differently: Take 20 mg by mouth every Monday, Wednesday, and Friday. )     Allergies:   Lisinopril, Entresto [sacubitril-valsartan], and Venlafaxine   Social History   Socioeconomic History  . Marital status: Divorced    Spouse name: Not on file  . Number of children: 7  . Years of education: Not on file  . Highest education level: Not on file  Occupational History  .  Not on file  Tobacco Use  . Smoking status: Never Smoker  . Smokeless tobacco: Never Used  Vaping Use  . Vaping Use: Never used  Substance and Sexual Activity  . Alcohol use: No  . Drug use: Never  . Sexual activity: Not on file  Other Topics Concern  . Not on file  Social History Narrative  . Not on file   Social Determinants of Health   Financial Resource Strain:   . Difficulty of Paying Living Expenses: Not on file  Food Insecurity:   . Worried About Charity fundraiser in the Last Year: Not on file  . Ran Out of Food in the Last Year:  Not on file  Transportation Needs:   . Lack of Transportation (Medical): Not on file  . Lack of Transportation (Non-Medical): Not on file  Physical Activity:   . Days of Exercise per Week: Not on file  . Minutes of Exercise per Session: Not on file  Stress:   . Feeling of Stress : Not on file  Social Connections:   . Frequency of Communication with Friends and Family: Not on file  . Frequency of Social Gatherings with Friends and Family: Not on file  . Attends Religious Services: Not on file  . Active Member of Clubs or Organizations: Not on file  . Attends Archivist Meetings: Not on file  . Marital Status: Not on file     Family History: The patient's family history includes ALS in her sister; Diabetes in her daughter; Heart attack in her brother; Hypertension in her mother.  ROS:   Please see the history of present illness.    All other systems reviewed and are negative.  EKGs/Labs/Other Studies Reviewed:    The following studies were reviewed today: Prior records  June 05, 2020 EKG personally reviewed shows sinus rhythm and RV apical pacing. QRS duration is 200 ms. PR interval is 240 ms.  September 05, 2020 in person device interrogation longevity 7.7 years DDDR 60-1 20 atrial lead impedance 399 ohms, capture threshold 0.50@.4, sensing 0.9 mV ventricular lead impedance 380 ohms, capture threshold 0.375 V at 0.4 ms, sensing 14.4 V pacing 99% of the time a pacing 54.6% of the time 2 episodes of nonsustained VT, both lasting 1 second   August 22, 2020 echocardiogram-no images available Left ventricle moderately dilated, ejection fraction 25% Left atrium severely dilated Right atrium severely dilated Moderate to severe mitral regurgitation Moderate tricuspid regurgitation  September 2018 echo showed a an ejection fraction of 65%  EKG:  The ekg ordered today demonstrates sinus rhythm and RV apical pacing  Recent Labs: 06/05/2020: BNP 1,854.5 07/20/2020:  NT-Pro BNP 9,907 08/25/2020: BUN 37; Creatinine, Ser 1.36; Potassium 4.8; Sodium 132  Recent Lipid Panel    Component Value Date/Time   CHOL 145 11/01/2019 1126   TRIG 49 11/01/2019 1126   HDL 76 11/01/2019 1126   CHOLHDL 1.9 11/01/2019 1126   LDLCALC 58 11/01/2019 1126    Physical Exam:    VS:  BP 102/60   Pulse 72   Ht 4\' 11"  (1.499 m)   Wt 133 lb 6.4 oz (60.5 kg)   SpO2 99%   BMI 26.94 kg/m     Wt Readings from Last 3 Encounters:  09/05/20 133 lb 6.4 oz (60.5 kg)  08/28/20 134 lb (60.8 kg)  08/07/20 126 lb (57.2 kg)     GEN: no acute distress HEENT: Normal NECK: No JVD; No carotid bruits LYMPHATICS: No lymphadenopathy  CARDIAC: RRR, no murmurs, rubs, gallops.  Pacemaker site well-healed without pain.   RESPIRATORY:  Clear to auscultation without rales, wheezing or rhonchi  ABDOMEN: Soft, non-tender, non-distended MUSCULOSKELETAL:  No edema; No deformity  SKIN: Warm and dry NEUROLOGIC:  Alert and oriented x 3 PSYCHIATRIC:  Normal affect   ASSESSMENT:    1. HFrEF (heart failure with reduced ejection fraction) ([place])   2. 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[facility])   3. Paroxysmal A-fib ([place])   4. Essential hypertension    PLAN:    In order of problems listed above:  1. Chronic systolic heart failure NYHA class II-III symptoms. Worsening LV function in the setting of 100% RV pacing. Patient is RV apical pacing with a QRS duration of 200 ms. I do think is possible that the RV apical pacing is contributing to her left ventricular dysfunction. Given her age and comorbidities I do not think she is a good candidate for CRT-D upgrade. Instead I will plan on implanting a CRT-P. We will start with a left upper extremity venogram on the day of the procedure.  If the left subclavian system is obstructed, we will plan to cancel the case and continue medical therapy.  If the left upper extremity venogram demonstrates a patent vein, we will proceed with CRT upgrade attempt. I have discussed  the risks and potential benefits of the procedure in depth with the patient and her daughter.  Risks include lead dislodgment and inability to find a suitable vein for the CS lead.  They are agreeable to proceed.  We will perform the procedure without sedation if at all possible at the patient and family's request to avoid/minimize procedural risk.  Patient apparently had this done in the past without issue.  We will plan on local anesthesia only.  If possible would like her primary cardiologist, Dr. Virgina Jock, to perform planned coronary angiography prior to CRT upgrade.  Ideally, a week at least before the procedure to maximize the time between dye loads.  Risks, benefits, alternatives to CS lead implantation were discussed in detail with the patient today. The patient understands that the risks include but are not limited to bleeding, infection, pneumothorax, perforation, tamponade, vascular damage, renal failure, MI, stroke, death, and lead dislodgement and wishes to proceed.  We will therefore schedule device implantation at the next available time.  2. Complete heart block with permanent pacemaker in situ 100% RV pacing as above Plan to upgrade to CRT-P  3. Paroxysmal atrial fibrillation Continue Eliquis 2.5 mg twice daily. Plan to hold this 2 days prior to the procedure and for 5 days after the procedure.  4. Hypertension     Medication Adjustments/Labs and Tests Ordered: Current medicines are reviewed at length with the patient today.  Concerns regarding medicines are outlined above.  Orders Placed This Encounter  Procedures  . Basic Metabolic Panel (BMET)  . CBC w/Diff  . EKG 12-Lead   No orders of the defined types were placed in this encounter.    Signed, Lars Mage, MD, [facility]  09/05/2020 4:06 PM    Electrophysiology [facility]

## 2020-09-06 DIAGNOSIS — Z7982 Long term (current) use of aspirin: Secondary | ICD-10-CM | POA: Diagnosis not present

## 2020-09-06 DIAGNOSIS — G629 Polyneuropathy, unspecified: Secondary | ICD-10-CM | POA: Diagnosis not present

## 2020-09-06 DIAGNOSIS — M479 Spondylosis, unspecified: Secondary | ICD-10-CM | POA: Diagnosis not present

## 2020-09-06 DIAGNOSIS — I2510 Atherosclerotic heart disease of native coronary artery without angina pectoris: Secondary | ICD-10-CM | POA: Diagnosis not present

## 2020-09-06 DIAGNOSIS — M1610 Unilateral primary osteoarthritis, unspecified hip: Secondary | ICD-10-CM | POA: Diagnosis not present

## 2020-09-06 DIAGNOSIS — Q652 Congenital dislocation of hip, unspecified: Secondary | ICD-10-CM | POA: Diagnosis not present

## 2020-09-06 DIAGNOSIS — I10 Essential (primary) hypertension: Secondary | ICD-10-CM | POA: Diagnosis not present

## 2020-09-06 DIAGNOSIS — G5603 Carpal tunnel syndrome, bilateral upper limbs: Secondary | ICD-10-CM | POA: Diagnosis not present

## 2020-09-06 DIAGNOSIS — Z7901 Long term (current) use of anticoagulants: Secondary | ICD-10-CM | POA: Diagnosis not present

## 2020-09-06 DIAGNOSIS — J45909 Unspecified asthma, uncomplicated: Secondary | ICD-10-CM | POA: Diagnosis not present

## 2020-09-06 DIAGNOSIS — E782 Mixed hyperlipidemia: Secondary | ICD-10-CM | POA: Diagnosis not present

## 2020-09-13 ENCOUNTER — Ambulatory Visit: Payer: Medicare Other | Admitting: Cardiology

## 2020-09-13 ENCOUNTER — Encounter: Payer: Self-pay | Admitting: Cardiology

## 2020-09-13 ENCOUNTER — Other Ambulatory Visit: Payer: Self-pay

## 2020-09-13 VITALS — BP 123/78 | HR 77 | Resp 16 | Ht 59.0 in | Wt 130.2 lb

## 2020-09-13 DIAGNOSIS — I442 Atrioventricular block, complete: Secondary | ICD-10-CM

## 2020-09-13 DIAGNOSIS — I25118 Atherosclerotic heart disease of native coronary artery with other forms of angina pectoris: Secondary | ICD-10-CM

## 2020-09-13 DIAGNOSIS — I480 Paroxysmal atrial fibrillation: Secondary | ICD-10-CM | POA: Diagnosis not present

## 2020-09-13 DIAGNOSIS — I5020 Unspecified systolic (congestive) heart failure: Secondary | ICD-10-CM | POA: Diagnosis not present

## 2020-09-13 DIAGNOSIS — Z45018 Encounter for adjustment and management of other part of cardiac pacemaker: Secondary | ICD-10-CM

## 2020-09-13 DIAGNOSIS — Z950 Presence of cardiac pacemaker: Secondary | ICD-10-CM

## 2020-09-13 DIAGNOSIS — I10 Essential (primary) hypertension: Secondary | ICD-10-CM | POA: Diagnosis not present

## 2020-09-13 NOTE — Progress Notes (Signed)
  Encounter for care of pacemaker  Pacemaker - MRI safe Medtronic Azure XT DR MRI [id number] Dual chamber PPM in situ  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[place])   Scheduled  In office pacemaker check 09/13/20  Single (S)/Dual (D)/BV: D. Presenting ASVP. Pacemaker dependant:  Yes. Underlying CHB. AP 68%, VP 99% AMS Episodes 0.  HVR 0. Longevity 8.5 Years. Magnet rate: >85%. Lead measurements: Stable.Marland Kitchen Histogram: Low (L)/normal (N)/high (H)  N. Patient activity Low.   Observations: Normal pacemaker function. Changes: Change RV output to 2V from 2.5 to conserve battery.   Adrian Prows, MD, [facility] 09/13/2020, 4:24 PM Office: [phone] Pager: [phone] .

## 2020-09-13 NOTE — Progress Notes (Signed)
Patient is here for follow up visit.  Subjective:   [name]  ID: Holly [name], female    DOB: 14-Nov-1940, 79 y.o.   MRN: [id number]   Chief Complaint  Patient presents with  . HFrEF (heart failure with reduced ejection fraction)  . Follow-up    HPI  79 year old African American female with hypertension, dual-chamber pacemaker placement for AV block, moderate, medically managed coronary artery disease (LCx), hypertension, NSVT, HFrEF  Patient reports generally not feeling well today.  She denies any chest pain or dyspnea.  She reports having taken laxative yesterday, and has been feeling worn out since then.  She has stable exertional dyspnea.  She was recently evaluated by electrophysiologist Dr. Lars Mage who offered CRT-P.  Requested coronary angiography prior to CRT-D placement.  Current Outpatient Medications on File Prior to Visit  Medication Sig Dispense Refill  . ALPRAZolam (XANAX) 0.25 MG tablet Take 1 tablet by mouth daily as needed for anxiety.     . bisacodyl (DULCOLAX) 5 MG EC tablet Take 5 mg by mouth daily as needed for moderate constipation.    . clobetasol ointment (TEMOVATE) 3.22 % Apply 1 application topically 2 (two) times daily as needed (itching).   1  . ELIQUIS 2.5 MG TABS tablet TAKE 1 TABLET BY MOUTH TWICE A DAY 60 tablet 6  . EUCRISA 2 % OINT Apply 1 application topically daily as needed (dry skin).     Marland Kitchen losartan (COZAAR) 25 MG tablet TAKE 1 TABLET BY MOUTH EVERY DAY 90 tablet 1  . metoprolol succinate (TOPROL-XL) 50 MG 24 hr tablet Take 1 tablet (50 mg total) by mouth daily. Take with or immediately following a meal. 90 tablet 3  . nitroGLYCERIN (NITROSTAT) 0.4 MG SL tablet Place 1 tablet (0.4 mg total) under the tongue every 5 (five) minutes as needed for chest pain. 90 tablet 3  . OLOPATADINE HCL OP Place 1 drop into both eyes 2 (two) times daily.    . Probiotic Product (PROBIOTIC PO) Take 1 capsule by mouth daily.     . RABEprazole (ACIPHEX) 20  MG tablet Take 20 mg by mouth in the morning and at bedtime.    . rosuvastatin (CRESTOR) 10 MG tablet Take 1 tablet (10 mg total) by mouth daily. 90 tablet 2  . spironolactone (ALDACTONE) 25 MG tablet Take 1 tablet (25 mg total) by mouth every other day. (Patient taking differently: Take 12.5 mg by mouth every other day. ) 1 tablet 0  . torsemide (DEMADEX) 20 MG tablet Monday, Wednesday, Friday (Patient taking differently: Take 20 mg by mouth every Monday, Wednesday, and Friday. ) 30 tablet 1  . triamcinolone cream (KENALOG) 0.1 % Apply topically 2 (two) times daily as needed.     No current facility-administered medications on file prior to visit.    Cardiovascular studies:  Echocardiogram 08/22/2020:  Left ventricle cavity is moderately dilated. Mild concentric hypertrophy  of the left ventricle. Abnormal septal wall motion due to right ventricle  pacemaker. Severe global hypokinesis. LVEF 25-30%. Diastolic function not  assessed due to paced rhythm.   Left atrial cavity is severely dilated.  Right atrial cavity is mildly dilated.  Structurally normal trileaflet aortic valve. Mild (Grade I) aortic  regurgitation.  Moderate to severe mitral regurgitation.  Moderate tricuspid regurgitation.  No evidence of pulmonary hypertension.  Compared to previous study on 04/05/2019, there is further progression of  mitral and tricuspid valve regurgitation.   EKG 06/05/2020: Sinus rhythm with ventricular pacing  Scheduled Remote pacemaker check  07/31/2020:  There were 0 atrial high rate episodes detected. There were 0 high ventricular rate episodes detected. Health trends do not demonstrate significant abnormality. Battery longevity is 7.8 years. RA pacing is 64.5 %, RV pacing is 99.1 %. Atrial unipolar lead impedance warning currently at 380 Ohms. Stable trend from previous on 07/04/20 and will continue to observe.  Lead trends over time is very stable.   Scheduled  In office pacemaker check  07/02/2020: Single (S)/Dual (D)/BV: D. Presenting A sensed V paced 74 bpm. Pacemaker dependant:  Yes. Underlying V paced at 30 bpm AP 19.8%, VP 99.8%. AMS Episodes 2.  AT/AF burden N/A% . Longest 4 hrs on 6/15 Afib. HVR 6. Longest 13 beat NSVT. Longevity 8.6 Years. Magnet rate: >85%. Lead measurements: Stable. Thoracic impedance: N/A. Histogram: Low (L)/normal (N)/high (H)  Low. No rate response on. Patient activity <1 hr/day.   Observations: As above. Changes: Rate response   Lower extremity venous duplex 08/06/2018:  No evidence of deep vein thrombosis of the lower extremities with normal venous return.  Lexiscan myoview stress test 08/04/2017:  1. Non-diagnostic due to paced rhythm. 2. Study quality: good. The left ventricular chamber dimensions are normal. There is medium scar of the inferolateral segment with moderate to severe residual ischemia. There is small scar of the inferior segment with very mild residual ischemia. 3. Systolic function is mildly reduced. The calculated stress EF is at 44 %. Gated SPECT imaging demonstrates hypokinesis in the inferior, lateral segment(s). 5. Intermediate risk study. Clinical correlation recommended.  Coronary angiogram 11/11/2017: LM: Normal LAD: D1 40% stneoses LCx: Tandem 50% stenoses in mid LCx RCA: Normal  Moderate coronary artery disease. She does have abnormal stress test in inferolateral territory, they are out of proportion to the coronary artery findings. Furthermore, she does not have any critical unstable lesion, to explain nonsustained ventricular tachycardia. She does not have any ongoing chest pain symptoms at this time.Recommend aggressive medical management at this time. If true anginal symptoms occur, could then perform PCI to mid circumflex.  Pacemaker Implant 07/16/2017:  Medtronic Azure XT DR MRI Sure Scan (serial number [id number]) dual chamber pacemaker 07/16/2017 by [facility] for complete heart block.  Recent  labs: 08/25/2020: Glucose 109, BUN/Cr 37/1.36. EGFR 37. Na/K 132/4.8.   07/20/2020: NT proBNP 9907  06/05/2020: Glucose 79, BUN/Cr 25/1.29. EGFR 45. Na/K 131/4.5 BNP 1854  04/26/2020: Glucose 97, BUN/Cr 29/1.75. EGFR 31. Na/K 129/4.0.  BNP 2647 (1271 on 04/06/2020)  04/06/2020: BNP 1371  03/22/2020: Glucose 91, BUN/Cr 22/1.18. EGFR 51. Na/K 139/4.1. Calcium 11.7. Rest of the CMP normal H/H 10.5/31.7. MCV 85. Platelets 220 BNP 2896  02/29/2020: Chol 102, TG 52, HDL 49, LDL 52  09/2019: BUN/Cr 33/1.3 HbA1C 5.3% TSH 2.6 normal   Lab Results  Component Value Date   TSH 1.987 07/15/2017    Review of Systems  Cardiovascular: Positive for dyspnea on exertion and leg swelling. Negative for chest pain, palpitations and syncope.         Objective:    Vitals:   09/13/20 1601  BP: 123/78  Pulse: 77  Resp: 16  SpO2: 99%       Physical Exam Vitals and nursing note reviewed.  Constitutional:      General: She is not in acute distress. Neck:     Vascular: No JVD.  Cardiovascular:     Rate and Rhythm: Normal rate and regular rhythm.     Heart sounds: Normal heart sounds. No murmur  heard.   Pulmonary:     Effort: Pulmonary effort is normal.     Breath sounds: Normal breath sounds. No wheezing or rales.  Musculoskeletal:     Right lower leg: Edema (Trace) present.     Left lower leg: Edema (Trace) present.           Assessment & Recommendations:   79 year old [place] American female with hypertension, dual-chamber pacemaker placement for AV block, moderate, medically managed coronary artery disease (LCx), hypertension, NSVT, HFrEF  Chronic systolic heart failure: Progression of NYHA symptoms, now class II-III. Serial echocardiograms showed further decline in her EF as well as worsening valvular regurgitation and chamber dilation. Appreciate Dr. Cathlyn Parsons help with CRT-P upgrade.  I will perform right and left heart catheterization prior to this.  Given that  she has not had any angina symptoms in the recent times, I would have very high threshold for intervention.  I suspect etiology for her heart failure is primarily related to RV dependent pacing. Take spironolactone 25 mg every other day, alternate with as needed tosemide 20 mg. Continue losartan 25 mg daily, metoprolol succinate 50 mg daily.  Did not tolerate Entresto due to skin rash.   Paroxysmal atrial fibrillation: CHA2DS2-VASc score 4, annual stroke risk 5%. Continue eiquis 2.5 mg twice daily.  Stopped aspirin at this time to avoid bleeding risk.  CAD: Continue statin, metoprolol succinate.  NSVT: Continue heart failure management as above  Hypertension:  Controlled.  F/u in 4 weeks  Nikolai, MD [facility] Pager: [phone] Office: [phone] If no answer Cell [phone]

## 2020-09-15 ENCOUNTER — Ambulatory Visit: Payer: Medicare Other | Admitting: Cardiology

## 2020-09-16 ENCOUNTER — Other Ambulatory Visit: Payer: Self-pay | Admitting: Cardiology

## 2020-09-16 DIAGNOSIS — I25118 Atherosclerotic heart disease of native coronary artery with other forms of angina pectoris: Secondary | ICD-10-CM

## 2020-09-16 DIAGNOSIS — I472 Ventricular tachycardia: Secondary | ICD-10-CM

## 2020-09-16 DIAGNOSIS — I4729 Other ventricular tachycardia: Secondary | ICD-10-CM

## 2020-09-19 ENCOUNTER — Other Ambulatory Visit: Payer: Self-pay | Admitting: Cardiology

## 2020-09-19 DIAGNOSIS — I5020 Unspecified systolic (congestive) heart failure: Secondary | ICD-10-CM

## 2020-09-26 DIAGNOSIS — H43813 Vitreous degeneration, bilateral: Secondary | ICD-10-CM | POA: Diagnosis not present

## 2020-09-26 DIAGNOSIS — H0102A Squamous blepharitis right eye, upper and lower eyelids: Secondary | ICD-10-CM | POA: Diagnosis not present

## 2020-09-26 DIAGNOSIS — H43393 Other vitreous opacities, bilateral: Secondary | ICD-10-CM | POA: Diagnosis not present

## 2020-09-26 DIAGNOSIS — H04123 Dry eye syndrome of bilateral lacrimal glands: Secondary | ICD-10-CM | POA: Diagnosis not present

## 2020-09-29 ENCOUNTER — Other Ambulatory Visit (HOSPITAL_COMMUNITY): Payer: Self-pay

## 2020-10-03 ENCOUNTER — Encounter (HOSPITAL_COMMUNITY): Admission: RE | Payer: Self-pay | Source: Home / Self Care

## 2020-10-03 ENCOUNTER — Ambulatory Visit (HOSPITAL_COMMUNITY): Admission: RE | Admit: 2020-10-03 | Payer: Medicare Other | Source: Home / Self Care | Admitting: Cardiology

## 2020-10-03 ENCOUNTER — Telehealth: Payer: Self-pay | Admitting: Cardiology

## 2020-10-03 DIAGNOSIS — I5020 Unspecified systolic (congestive) heart failure: Secondary | ICD-10-CM

## 2020-10-03 DIAGNOSIS — I5021 Acute systolic (congestive) heart failure: Secondary | ICD-10-CM | POA: Diagnosis not present

## 2020-10-03 SURGERY — RIGHT/LEFT HEART CATH AND CORONARY ANGIOGRAPHY
Anesthesia: LOCAL

## 2020-10-03 NOTE — Telephone Encounter (Signed)
Called and left patient a voicemail to see if she can get R/S for cath .

## 2020-10-03 NOTE — Telephone Encounter (Signed)
Patient has not showed up for cath today. Staff, please follow up and check if I could cath her on 12/2 or 12/3 7:30 AM. She has BIV pacemaker placement scheduled on 12/6 and needs cath before that.  Thanks MJP

## 2020-10-04 ENCOUNTER — Ambulatory Visit: Payer: Medicare Other | Admitting: Cardiology

## 2020-10-04 ENCOUNTER — Telehealth: Payer: Self-pay

## 2020-10-04 ENCOUNTER — Telehealth (INDEPENDENT_AMBULATORY_CARE_PROVIDER_SITE_OTHER): Payer: Medicare Other | Admitting: Cardiology

## 2020-10-04 DIAGNOSIS — I480 Paroxysmal atrial fibrillation: Secondary | ICD-10-CM | POA: Diagnosis not present

## 2020-10-04 DIAGNOSIS — I442 Atrioventricular block, complete: Secondary | ICD-10-CM | POA: Diagnosis not present

## 2020-10-04 DIAGNOSIS — I5022 Chronic systolic (congestive) heart failure: Secondary | ICD-10-CM

## 2020-10-04 NOTE — Telephone Encounter (Signed)
New message   Pt is calling asking to RS procedure on 10/09/20 to after the holidays.

## 2020-10-04 NOTE — Telephone Encounter (Signed)
Holly [name]

## 2020-10-04 NOTE — Telephone Encounter (Signed)
Spoke with patient. She stated that her mind is not ready for all of this. But she will call Dr. Quentin Ore first, discuss a few things, (did not specify) and then will call back to schedule the cath, before the end of the day.

## 2020-10-04 NOTE — Progress Notes (Signed)
Virtual Visit via Telephone Note   This visit type was conducted due to national recommendations for restrictions regarding the COVID-19 Pandemic (e.g. social distancing) in an effort to limit this patient's exposure and mitigate transmission in our community.  Due to her co-morbid illnesses, this patient is at least at moderate risk for complications without adequate follow up.  This format is felt to be most appropriate for this patient at this time.  The patient did not have access to video technology/had technical difficulties with video requiring transitioning to audio format only (telephone).  All issues noted in this document were discussed and addressed.  No physical exam could be performed with this format.  Please refer to the patient's chart for her  consent to telehealth for [facility].    Date:  10/04/2020   ID:  Holly [name], DOB 1941-06-24, MRN [id number] The patient was identified using 2 identifiers.  Patient Location: Home Provider Location: Office/Clinic  PCP:  Merrilee Seashore, MD   Electrophysiologist:  Vickie Epley, MD   Evaluation Performed:  Follow-Up Visit  Chief Complaint: Chronic systolic heart failure  History of Present Illness:    Holly [name] is a 79 y.o. female with chronic systolic heart failure, hypertension, dual-chamber permanent pacemaker who presents today for a virtual visit.  I last saw the patient on September 05, 2020.  At that visit she was displaying NYHA class II-III symptoms and worsening left ventricular function in the setting of 100% RV pacing.  The plan after our visit was to upgrade her current pacemaker system to a CRT-P system.  I requested a left heart catheterization be performed prior to the procedure to confirm there is no significant coronary artery disease contributing to her worsening left ventricular function.  The plan was to perform a left upper extremity venogram during the same procedure to confirm patency of the  left subclavian venous system.  The patient was scheduled to have a left heart catheterization on October 03, 2020 but canceled the procedure.  Today during our virtual encounter, patient told me that she had too much on her plate to go through the left heart catheterization and CRT-P upgrade.  She is dealing with some personal issues with her daughter and told me that she would like to delay the heart catheterization and CRT-P upgrade into the first of the year.  She still interested in having the procedure done but given the family troubles she is experiencing she did not think that it would be right to pursue these procedures now.  She told me she is otherwise doing well with stable symptoms from her chronic systolic heart failure.  The patient does not have symptoms concerning for COVID-19 infection (fever, chills, cough, or new shortness of breath).    Past Medical History:  Diagnosis Date  . AV block, 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[place]) 07/15/2017  . Bronchitis   . Encounter for care of pacemaker 05/12/2019  . Hypertension   . Pacemaker - MRI safe Medtronic Azure XT DR MRI [id number] Dual chamber PPM in situ 04/24/2019   Past Surgical History:  Procedure Laterality Date  . LEFT HEART CATH AND CORONARY ANGIOGRAPHY N/A 11/11/2017   Procedure: LEFT HEART CATH AND CORONARY ANGIOGRAPHY;  Surgeon: Nigel Mormon, MD;  Location: [place] [facility];  Service: Cardiovascular;  Laterality: N/A;  . left leg surgery    . PACEMAKER IMPLANT N/A 07/15/2017   Procedure: Pacemaker Implant;  Surgeon: Constance Haw, MD;  Location: [place] [facility];  Service: Cardiovascular;  Laterality: N/A;     No outpatient medications have been marked as taking for the 10/04/20 encounter (Telemedicine) with Vickie Epley, MD.     Allergies:   Lisinopril, Entresto [sacubitril-valsartan], and Venlafaxine   Social History   Tobacco Use  . Smoking status: Never Smoker  . Smokeless tobacco: Never Used  Vaping Use  . Vaping  Use: Never used  Substance Use Topics  . Alcohol use: No  . Drug use: Never     Family Hx: The patient's family history includes ALS in her sister; Diabetes in her daughter; Heart attack in her brother; Hypertension in her mother.  ROS:   Please see the history of present illness.     All other systems reviewed and are negative.   Prior CV studies:   The following studies were reviewed today: N/A   Labs/Other Tests and Data Reviewed:    EKG:  No ECG reviewed.  Recent Labs: 06/05/2020: BNP 1,854.5 07/20/2020: NT-Pro BNP 9,907 08/25/2020: BUN 37; Creatinine, Ser 1.36; Potassium 4.8; Sodium 132   Recent Lipid Panel Lab Results  Component Value Date/Time   CHOL 145 11/01/2019 11:26 AM   TRIG 49 11/01/2019 11:26 AM   HDL 76 11/01/2019 11:26 AM   CHOLHDL 1.9 11/01/2019 11:26 AM   LDLCALC 58 11/01/2019 11:26 AM    Wt Readings from Last 3 Encounters:  09/13/20 130 lb 3.2 oz (59.1 kg)  09/05/20 133 lb 6.4 oz (60.5 kg)  08/28/20 134 lb (60.8 kg)      Objective:     VITAL SIGNS:  reviewed GEN:  no acute distress CARDIOVASCULAR:  no peripheral edema NEURO:  alert and oriented x 3, no obvious focal deficit PSYCH:  normal affect  ASSESSMENT & PLAN:    1. Chronic systolic heart failure NYHA class II-III symptoms.  Worsening left ventricular function in the setting of hundredths RV pacing.  QRS duration is 200 ms.  I have recommended CRT-P upgrade.  The patient would like to schedule this at the first of the year.  Prior to the CRT-P upgrade, we will plan on a left heart catheterization to confirm no progression of coronary artery disease that would explain the decrease in left ventricular function.  We will work with Dr. Virgina Jock to have the heart catheterization and left upper extremity venogram rescheduled approximately 1 week prior to CRT P upgrade. Plan will be to perform the procedure using no sedation and local anesthesia only.  2.  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  Paroxysmal atrial fibrillation On Eliquis 2.5 mg twice daily.  Plan to hold it 2 days prior to the procedure and for 5 days after the procedure.  4.  Hypertension  Time:   Today, I have spent 30 minutes with the patient with telehealth technology discussing the above problems.     Medication Adjustments/Labs and Tests Ordered: Current medicines are reviewed at length with the patient today.  Concerns regarding medicines are outlined above.   Tests Ordered: No orders of the defined types were placed in this encounter.   Medication Changes: No orders of the defined types were placed in this encounter.   Follow Up:  In Person For CRT P upgrade  Signed, Vickie Epley, MD  10/04/2020 9:20 PM    [place]

## 2020-10-05 DIAGNOSIS — I2510 Atherosclerotic heart disease of native coronary artery without angina pectoris: Secondary | ICD-10-CM | POA: Diagnosis not present

## 2020-10-05 DIAGNOSIS — R634 Abnormal weight loss: Secondary | ICD-10-CM | POA: Diagnosis not present

## 2020-10-05 DIAGNOSIS — I129 Hypertensive chronic kidney disease with stage 1 through stage 4 chronic kidney disease, or unspecified chronic kidney disease: Secondary | ICD-10-CM | POA: Diagnosis not present

## 2020-10-05 DIAGNOSIS — I10 Essential (primary) hypertension: Secondary | ICD-10-CM | POA: Diagnosis not present

## 2020-10-05 DIAGNOSIS — R7303 Prediabetes: Secondary | ICD-10-CM | POA: Diagnosis not present

## 2020-10-06 ENCOUNTER — Other Ambulatory Visit (HOSPITAL_COMMUNITY): Payer: Medicare Other

## 2020-10-06 ENCOUNTER — Other Ambulatory Visit: Payer: Medicare Other

## 2020-10-09 ENCOUNTER — Ambulatory Visit (HOSPITAL_COMMUNITY): Admission: RE | Admit: 2020-10-09 | Payer: Medicare Other | Source: Home / Self Care | Admitting: Cardiology

## 2020-10-09 ENCOUNTER — Encounter (HOSPITAL_COMMUNITY): Admission: RE | Payer: Self-pay | Source: Home / Self Care

## 2020-10-09 SURGERY — BIV PACEMAKER INSERTION CRT-P

## 2020-10-11 DIAGNOSIS — N1831 Chronic kidney disease, stage 3a: Secondary | ICD-10-CM | POA: Diagnosis not present

## 2020-10-11 DIAGNOSIS — I5041 Acute combined systolic (congestive) and diastolic (congestive) heart failure: Secondary | ICD-10-CM | POA: Diagnosis not present

## 2020-10-11 DIAGNOSIS — Z0000 Encounter for general adult medical examination without abnormal findings: Secondary | ICD-10-CM | POA: Diagnosis not present

## 2020-10-16 NOTE — Telephone Encounter (Signed)
Outreach made to pt.  Left message on VM per DPR.  Advised that Pt needs to have cardiac cath prior to pacemaker upgrade.   Unable to determine if Pt has scheduled this with Dr. Virgina Jock.  Requested Pt return this nurse call to discuss.  Advised to call Dr. Bonney Roussel office and schedule her cardiac cath if she has not already.

## 2020-10-19 ENCOUNTER — Ambulatory Visit: Payer: Medicare Other

## 2020-10-20 NOTE — Telephone Encounter (Signed)
Pt has follow up scheduled with Dr. Virgina Jock on November 15, 2020.  Will continue to monitor based on the results of that appt.

## 2020-10-23 ENCOUNTER — Telehealth: Payer: Self-pay

## 2020-10-23 NOTE — Telephone Encounter (Signed)
Patient called and stated that her transmitter alarm has been going off for about 4 days now and she tried to troubleshoot on her own with the manual, but has not had any success. She stated that she feels fine overall. Right at this moment, the light is flashing like it's loading, but the alarm has stopped, for now.

## 2020-10-23 NOTE — Telephone Encounter (Signed)
I called and discussed with patient's daughter and she has no alarms, there is no sound coming from a pacemaker.  The remote transmitter was lit up, advised her to disconnected and reconnected transmitter.  I reviewed her remote transmissions, there is been no alert.

## 2020-10-24 ENCOUNTER — Ambulatory Visit: Payer: Medicare Other

## 2020-10-29 DIAGNOSIS — Z950 Presence of cardiac pacemaker: Secondary | ICD-10-CM | POA: Diagnosis not present

## 2020-10-29 DIAGNOSIS — I442 Atrioventricular block, complete: Secondary | ICD-10-CM | POA: Diagnosis not present

## 2020-10-29 DIAGNOSIS — Z45018 Encounter for adjustment and management of other part of cardiac pacemaker: Secondary | ICD-10-CM | POA: Diagnosis not present

## 2020-11-03 DIAGNOSIS — I5021 Acute systolic (congestive) heart failure: Secondary | ICD-10-CM | POA: Diagnosis not present

## 2020-11-14 DIAGNOSIS — I5022 Chronic systolic (congestive) heart failure: Secondary | ICD-10-CM | POA: Insufficient documentation

## 2020-11-14 NOTE — Progress Notes (Signed)
Patient is here for follow up visit.  Subjective:   [name]  ID: Haze Rushing, female    DOB: 1941-04-06, 80 y.o.   MRN: [id number]   Chief Complaint  Patient presents with   HFrEF    Follow-up    HPI  80 year old African American female with hypertension, dual-chamber pacemaker placement for AV block, moderate, medically managed coronary artery disease (LCx), hypertension, NSVT, HFrEF  Point to patient's systolic heart failure in the setting of single-chamber ICD with 100% RV pacing, I had referred her to EP for evaluation of CRT-P.  Prior to that, EP had recommended that patient undergo coronary angiogram to rule out obstructive CAD.  However, patient had decided to hold off the work-up.  Patient is here for follow up today, with her daughter. She has not had any overt dyspnea, leg edmea symptoms. She has had some cough, which improved with treatment by her PCP. Reviewed home BP log, BP are stable. Weight is down by 4 lbs.    Current Outpatient Medications on File Prior to Visit  Medication Sig Dispense Refill   ALPRAZolam (XANAX) 0.25 MG tablet Take 1 tablet by mouth daily as needed for anxiety.      bisacodyl (DULCOLAX) 5 MG EC tablet Take 5 mg by mouth daily as needed for moderate constipation.     clobetasol ointment (TEMOVATE) 9.82 % Apply 1 application topically 2 (two) times daily as needed (itching).   1   ELIQUIS 2.5 MG TABS tablet TAKE 1 TABLET BY MOUTH TWICE A DAY 60 tablet 6   EUCRISA 2 % OINT Apply 1 application topically daily as needed (dry skin).      losartan (COZAAR) 25 MG tablet TAKE 1 TABLET BY MOUTH EVERY DAY 90 tablet 1   metoprolol succinate (TOPROL-XL) 50 MG 24 hr tablet Take 1 tablet (50 mg total) by mouth daily. Take with or immediately following a meal. 90 tablet 3   nitroGLYCERIN (NITROSTAT) 0.4 MG SL tablet Place 1 tablet (0.4 mg total) under the tongue every 5 (five) minutes as needed for chest pain. 90 tablet 3   OLOPATADINE HCL OP  Place 1 drop into both eyes 2 (two) times daily.     Probiotic Product (PROBIOTIC PO) Take 1 capsule by mouth daily.      RABEprazole (ACIPHEX) 20 MG tablet Take 20 mg by mouth in the morning and at bedtime.     rosuvastatin (CRESTOR) 10 MG tablet Take 1 tablet (10 mg total) by mouth daily. 90 tablet 2   spironolactone (ALDACTONE) 25 MG tablet Take 1 tablet (25 mg total) by mouth every other day. (Patient taking differently: Take 12.5 mg by mouth every other day. ) 1 tablet 0   torsemide (DEMADEX) 20 MG tablet TAKE 1 TABLET BY MOUTH EVERY DAY 90 tablet 1   triamcinolone cream (KENALOG) 0.1 % Apply topically 2 (two) times daily as needed.     No current facility-administered medications on file prior to visit.    Cardiovascular studies:  Echocardiogram 08/22/2020:  Left ventricle cavity is moderately dilated. Mild concentric hypertrophy  of the left ventricle. Abnormal septal wall motion due to right ventricle  pacemaker. Severe global hypokinesis. LVEF 25-30%. Diastolic function not  assessed due to paced rhythm.   Left atrial cavity is severely dilated.  Right atrial cavity is mildly dilated.  Structurally normal trileaflet aortic valve. Mild (Grade I) aortic  regurgitation.  Moderate to severe mitral regurgitation.  Moderate tricuspid regurgitation.  No evidence of pulmonary  hypertension.  Compared to previous study on 04/05/2019, there is further progression of  mitral and tricuspid valve regurgitation.   EKG 06/05/2020: Sinus rhythm with ventricular pacing  Scheduled Remote pacemaker check  07/31/2020:  There were 0 atrial high rate episodes detected. There were 0 high ventricular rate episodes detected. Health trends do not demonstrate significant abnormality. Battery longevity is 7.8 years. RA pacing is 64.5 %, RV pacing is 99.1 %. Atrial unipolar lead impedance warning currently at 380 Ohms. Stable trend from previous on 07/04/20 and will continue to observe.  Lead trends  over time is very stable.   Scheduled  In office pacemaker check 07/02/2020: Single (S)/Dual (D)/BV: D. Presenting A sensed V paced 74 bpm. Pacemaker dependant:  Yes. Underlying V paced at 30 bpm AP 19.8%, VP 99.8%. AMS Episodes 2.  AT/AF burden N/A% . Longest 4 hrs on 6/15 Afib. HVR 6. Longest 13 beat NSVT. Longevity 8.6 Years. Magnet rate: >85%. Lead measurements: Stable. Thoracic impedance: N/A. Histogram: Low (L)/normal (N)/high (H)  Low. No rate response on. Patient activity <1 hr/day.   Observations: As above. Changes: Rate response   Lower extremity venous duplex 08/06/2018:  No evidence of deep vein thrombosis of the lower extremities with normal venous return.  Lexiscan myoview stress test 08/04/2017:  1. Non-diagnostic due to paced rhythm. 2. Study quality: good. The left ventricular chamber dimensions are normal. There is medium scar of the inferolateral segment with moderate to severe residual ischemia. There is small scar of the inferior segment with very mild residual ischemia. 3. Systolic function is mildly reduced. The calculated stress EF is at 44 %. Gated SPECT imaging demonstrates hypokinesis in the inferior, lateral segment(s). 5. Intermediate risk study. Clinical correlation recommended.  Coronary angiogram 11/11/2017: LM: Normal LAD: D1 40% stneoses LCx: Tandem 50% stenoses in mid LCx RCA: Normal  Moderate coronary artery disease. She does have abnormal stress test in inferolateral territory, they are out of proportion to the coronary artery findings. Furthermore, she does not have any critical unstable lesion, to explain nonsustained ventricular tachycardia. She does not have any ongoing chest pain symptoms at this time.Recommend aggressive medical management at this time. If true anginal symptoms occur, could then perform PCI to mid circumflex.  Pacemaker Implant 07/16/2017:  Medtronic Azure XT DR MRI Sure Scan (serial number [id number]) dual chamber pacemaker  07/16/2017 by [facility] for complete heart block.  Recent labs: 10/05/2020: Glucose 99, BUN/Cr 31/1.6. EGFR 30.  HbA1C 5.7% Chol 135, TG 65, HDL 58, LDL 64 TSH 1.9  08/25/2020: Glucose 109, BUN/Cr 37/1.36. EGFR 37. Na/K 132/4.8.   07/20/2020: NT proBNP 9907  06/05/2020: Glucose 79, BUN/Cr 25/1.29. EGFR 45. Na/K 131/4.5 BNP 1854  04/26/2020: Glucose 97, BUN/Cr 29/1.75. EGFR 31. Na/K 129/4.0.  BNP 2647 (1271 on 04/06/2020)  04/06/2020: BNP 1371  03/22/2020: Glucose 91, BUN/Cr 22/1.18. EGFR 51. Na/K 139/4.1. Calcium 11.7. Rest of the CMP normal H/H 10.5/31.7. MCV 85. Platelets 220 BNP 2896  02/29/2020: Chol 102, TG 52, HDL 49, LDL 52  09/2019: BUN/Cr 33/1.3 HbA1C 5.3% TSH 2.6 normal   Lab Results  Component Value Date   TSH 1.987 07/15/2017    Review of Systems  Cardiovascular: Positive for dyspnea on exertion and leg swelling. Negative for chest pain, palpitations and syncope.         Objective:    Vitals:   11/15/20 1105  BP: (!) 112/59  Pulse: 63  Resp: 16  SpO2: 98%       Physical Exam Vitals  and nursing note reviewed.  Constitutional:      General: She is not in acute distress. Neck:     Vascular: No JVD.  Cardiovascular:     Rate and Rhythm: Normal rate and regular rhythm.     Heart sounds: Normal heart sounds. No murmur heard.   Pulmonary:     Effort: Pulmonary effort is normal.     Breath sounds: Normal breath sounds. No wheezing or rales.  Musculoskeletal:     Right lower leg: Edema (Trace) present.     Left lower leg: Edema (Trace) present.           Assessment & Recommendations:   80 year old [place] American female with hypertension, dual-chamber pacemaker placement for AV block, moderate, medically managed coronary artery disease (LCx), hypertension, NSVT, HFrEF  Chronic systolic heart failure: Stable NYHA class II symptoms Serial echocardiograms showed further decline in her EF as well as worsening valvular regurgitation and  chamber dilation. I had offered cath to exclude obstructive CAD and then consider CRT-P placement my Dr. Quentin Ore. However, considering her age and relative lack of symptoms, patient would like to hold off any invasive treatment. Continue current medical therapy for HFrEF. Take spironolactone 25 mg every other day, alternate with as needed tosemide 20 mg. Continue losartan 25 mg daily, metoprolol succinate 50 mg daily.  Did not tolerate Entresto due to skin rash.   Paroxysmal atrial fibrillation: CHA2DS2-VASc score 4, annual stroke risk 5%. Continue eiquis 2.5 mg twice daily.  Stopped aspirin at this time to avoid bleeding risk.  CAD: Continue statin, metoprolol succinate.  NSVT: Continue heart failure management as above  Hypertension:  Controlled.  F/u in 3 months  [place], MD [facility] Pager: [phone] Office: [phone] If no answer Cell [phone]

## 2020-11-15 ENCOUNTER — Ambulatory Visit: Payer: Medicare Other | Admitting: Cardiology

## 2020-11-15 ENCOUNTER — Encounter: Payer: Self-pay | Admitting: Cardiology

## 2020-11-15 ENCOUNTER — Other Ambulatory Visit: Payer: Self-pay

## 2020-11-15 VITALS — BP 112/59 | HR 63 | Resp 16 | Ht 59.0 in | Wt 123.8 lb

## 2020-11-15 DIAGNOSIS — I442 Atrioventricular block, complete: Secondary | ICD-10-CM

## 2020-11-15 DIAGNOSIS — I480 Paroxysmal atrial fibrillation: Secondary | ICD-10-CM | POA: Diagnosis not present

## 2020-11-15 DIAGNOSIS — I25118 Atherosclerotic heart disease of native coronary artery with other forms of angina pectoris: Secondary | ICD-10-CM

## 2020-11-15 DIAGNOSIS — I5022 Chronic systolic (congestive) heart failure: Secondary | ICD-10-CM | POA: Diagnosis not present

## 2020-11-15 DIAGNOSIS — I10 Essential (primary) hypertension: Secondary | ICD-10-CM | POA: Diagnosis not present

## 2020-12-04 DIAGNOSIS — I5021 Acute systolic (congestive) heart failure: Secondary | ICD-10-CM | POA: Diagnosis not present

## 2020-12-31 ENCOUNTER — Encounter: Payer: Self-pay | Admitting: Pharmacist

## 2020-12-31 NOTE — Progress Notes (Signed)
CARE PLAN ENTRY  12/31/2020 Name: Holly [name] MRN: [id number] DOB: 1941-03-04  Holly [name] is enrolled in Remote Patient Monitoring/Principle Care Monitoring.  Date of Enrollment: 08/28/20 Supervising physician: [name] Indication: CHF  Remote Readings: {Compliant and Avg BP: 104/65, HR:65; 5 lbs weight loss   Pharmacist Clinical Goal(s):  Marland Kitchen Over the next 90 days, patient will demonstrate Improved medication adherence as evidenced by medication fill history . Over the next 90 days, patient will demonstrate improved understanding of prescribed medications and rationale for usage as evidenced by patient teach back . Over the next 90 days, patient will experience decrease in ED visits. ED visits in last 6 months = 2 . Over the next 90 days, patient will not experience hospital admission. Hospital Admissions in last 6 months = 2  Interventions: . Provider and Inter-disciplinary care team collaboration (see longitudinal plan of care) . Comprehensive medication review performed. . Discussed plans with patient for ongoing care management follow up and provided patient with direct contact information for care management team . Collaboration with health plan regarding medication benefit . Collaboration with provider re: medication management  Patient Active Problem List   Diagnosis Date Noted  . Chronic systolic heart failure ([place]) 11/14/2020  . Paroxysmal A-fib ([place]) 05/04/2020  . HFrEF (heart failure with reduced ejection fraction) ([place]) 04/21/2020  . Acute systolic heart failure ([place]) 04/10/2020  . Nonrheumatic mitral valve regurgitation 04/10/2020  . Elevated brain natriuretic peptide (BNP) level 03/30/2020  . Leg edema 03/30/2020  . SOB (shortness of breath) 03/15/2020  . 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  . NSVT (nonsustained ventricular tachycardia) ([place]) 09/23/2019  . Encounter for care of pacemaker 05/12/2019  . Coronary artery disease of native  artery of native heart with stable angina pectoris ([place]) 04/24/2019  . Essential hypertension 04/24/2019  . Abnormal stress test 11/08/2017  . 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[place]) 07/15/2017  . Pacemaker - MRI safe Medtronic Azure XT DR MRI [id number] Dual chamber PPM in situ 07/15/2017  . 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 Carpal tunnel syndrome 08/09/2013   Past Surgical History:  Procedure Laterality Date  . LEFT HEART CATH AND CORONARY ANGIOGRAPHY N/A 11/11/2017   Procedure: LEFT HEART CATH AND CORONARY ANGIOGRAPHY;  Surgeon: Nigel Mormon, MD;  Location: [facility];  Service: Cardiovascular;  Laterality: N/A;  . left leg surgery    . PACEMAKER IMPLANT N/A 07/15/2017   Procedure: Pacemaker Implant;  Surgeon: Constance Haw, MD;  Location: [place] [facility];  Service: Cardiovascular;  Laterality: N/A;   Social History   Socioeconomic History  . Marital status: Divorced    Spouse name: Not on file  . Number of children: 7  . Years of education: Not on file  . Highest education level: Not on file  Occupational History  . Not on file  Tobacco Use  . Smoking status: Never Smoker  . Smokeless tobacco: Never Used  Vaping Use  . Vaping Use: Never used  Substance and Sexual Activity  . Alcohol use: No  . Drug use: Never  . Sexual activity: Not on file  Other Topics Concern  . Not on file  Social History Narrative  . Not on file   Social Determinants of Health   Financial Resource Strain: Not on file  Food Insecurity: Not on file  Transportation Needs: Not on file  Physical Activity: Not on file  Stress: Not on file  Social Connections: Not on file   Family History  Problem Relation Age of Onset  . Hypertension Mother   . ALS Sister   . Heart attack Brother   . Diabetes Daughter    Allergies  Allergen Reactions  . Lisinopril Cough  . Entresto [Sacubitril-Valsartan] Itching, Rash and Cough   . Venlafaxine Rash   Outpatient Encounter Medications as of 12/31/2020  Medication Sig  . ALPRAZolam (XANAX) 0.25 MG tablet Take 1 tablet by mouth daily as needed for anxiety.   . clobetasol ointment (TEMOVATE) [id number] % Apply 1 application topically 2 (two) times daily as needed (itching).   Marland Kitchen ELIQUIS 2.5 MG TABS tablet TAKE 1 TABLET BY MOUTH TWICE A DAY  . EUCRISA 2 % OINT Apply 1 application topically daily as needed (dry skin).   Marland Kitchen losartan (COZAAR) 25 MG tablet TAKE 1 TABLET BY MOUTH EVERY DAY  . metoprolol succinate (TOPROL-XL) 50 MG 24 hr tablet Take 1 tablet (50 mg total) by mouth daily. Take with or immediately following a meal.  . nitroGLYCERIN (NITROSTAT) 0.4 MG SL tablet Place 1 tablet (0.4 mg total) under the tongue every 5 (five) minutes as needed for chest pain.  . OLOPATADINE HCL OP Place 1 drop into both eyes 2 (two) times daily.  . RABEprazole (ACIPHEX) 20 MG tablet Take 20 mg by mouth in the morning and at bedtime.  . rosuvastatin (CRESTOR) 10 MG tablet Take 1 tablet (10 mg total) by mouth daily.  Marland Kitchen spironolactone (ALDACTONE) 25 MG tablet Take 1 tablet (25 mg total) by mouth every other day. (Patient taking differently: Take 12.5 mg by mouth every other day.)  . torsemide (DEMADEX) 20 MG tablet TAKE 1 TABLET BY MOUTH EVERY DAY  . triamcinolone cream (KENALOG) 0.1 % Apply topically 2 (two) times daily as needed.   No facility-administered encounter medications on file as of 12/31/2020.   Patient Care Team    Relationship Specialty Notifications Start End  Merrilee Seashore, MD PCP - General Internal Medicine  07/19/17   Vickie Epley, MD PCP - Electrophysiology Cardiology  10/04/20     Current Diagnosis/Assessment: Goals Addressed   None    Heart Failure   Type: Systolic  Last ejection fraction: 25-30% NYHA Class: III (marked limitation of activity) AHA HF Stage: C (Heart disease and symptoms present)  Patient has failed these meds in past: irbesartan,  amlodipine, maxzide, losartan Patient is currently controlled on the following medications: torsemide, spironolactone, losartan, metoprolol  We discussed diet and exercise extensively and weighing daily; if you gain more than 3 pounds in one day or 5 pounds in one week call your doctor  Plan  Continue current medications and control with diet and exercise  ______________ Visit Information SDOH (Social Determinants of Health) assessments performed: Yes.  Ms. Wickliff was given information about Principle Care Management/Remote Patient Monitoring services today including:  1. RPM/PCM service includes personalized support from designated clinical staff supervised by her physician, including individualized plan of care and coordination with other care providers 2. 24/7 contact phone numbers for assistance for urgent and routine care needs. 3. Standard insurance, coinsurance, copays and deductibles apply for principle care management only during months in which we provide at least 30 minutes of these services. Most insurances cover these services at 100%, however patients may be responsible for any copay, coinsurance and/or deductible if applicable. This service may help you avoid the need for more expensive face-to-face services. 4. Only one practitioner may furnish and bill the service  in a calendar month. 5. The patient may stop PCM/RPM services at any time (effective at the end of the month) by phone call to the office staff.  Patient agreed to services and verbal consent obtained.   Manuela Schwartz, Pharm.D. [facility] [phone]

## 2021-01-04 DIAGNOSIS — I5021 Acute systolic (congestive) heart failure: Secondary | ICD-10-CM | POA: Diagnosis not present

## 2021-01-10 ENCOUNTER — Other Ambulatory Visit: Payer: Self-pay

## 2021-01-10 DIAGNOSIS — I5020 Unspecified systolic (congestive) heart failure: Secondary | ICD-10-CM

## 2021-01-10 MED ORDER — SPIRONOLACTONE 25 MG PO TABS: 25.0000 mg | ORAL_TABLET | ORAL | 6 refills | Status: DC

## 2021-01-16 ENCOUNTER — Encounter: Payer: Medicare Other | Admitting: Cardiology

## 2021-01-24 DIAGNOSIS — I5041 Acute combined systolic (congestive) and diastolic (congestive) heart failure: Secondary | ICD-10-CM | POA: Diagnosis not present

## 2021-01-24 DIAGNOSIS — N1831 Chronic kidney disease, stage 3a: Secondary | ICD-10-CM | POA: Diagnosis not present

## 2021-01-24 DIAGNOSIS — E782 Mixed hyperlipidemia: Secondary | ICD-10-CM | POA: Diagnosis not present

## 2021-01-27 ENCOUNTER — Other Ambulatory Visit: Payer: Self-pay | Admitting: Cardiology

## 2021-01-27 DIAGNOSIS — I5020 Unspecified systolic (congestive) heart failure: Secondary | ICD-10-CM

## 2021-01-31 ENCOUNTER — Ambulatory Visit: Payer: Medicare Other | Admitting: Cardiology

## 2021-01-31 DIAGNOSIS — I10 Essential (primary) hypertension: Secondary | ICD-10-CM | POA: Diagnosis not present

## 2021-01-31 DIAGNOSIS — I5041 Acute combined systolic (congestive) and diastolic (congestive) heart failure: Secondary | ICD-10-CM | POA: Diagnosis not present

## 2021-01-31 DIAGNOSIS — E782 Mixed hyperlipidemia: Secondary | ICD-10-CM | POA: Diagnosis not present

## 2021-02-02 ENCOUNTER — Other Ambulatory Visit: Payer: Self-pay

## 2021-02-02 ENCOUNTER — Ambulatory Visit: Payer: Medicare Other | Admitting: Cardiology

## 2021-02-02 ENCOUNTER — Encounter: Payer: Self-pay | Admitting: Cardiology

## 2021-02-02 VITALS — BP 127/78 | HR 68 | Temp 98.2°F | Ht 59.0 in | Wt 128.0 lb

## 2021-02-02 DIAGNOSIS — I442 Atrioventricular block, complete: Secondary | ICD-10-CM

## 2021-02-02 DIAGNOSIS — I5022 Chronic systolic (congestive) heart failure: Secondary | ICD-10-CM

## 2021-02-02 DIAGNOSIS — I480 Paroxysmal atrial fibrillation: Secondary | ICD-10-CM | POA: Diagnosis not present

## 2021-02-02 DIAGNOSIS — I25118 Atherosclerotic heart disease of native coronary artery with other forms of angina pectoris: Secondary | ICD-10-CM

## 2021-02-02 DIAGNOSIS — I5020 Unspecified systolic (congestive) heart failure: Secondary | ICD-10-CM | POA: Diagnosis not present

## 2021-02-02 DIAGNOSIS — I10 Essential (primary) hypertension: Secondary | ICD-10-CM

## 2021-02-02 MED ORDER — TORSEMIDE 20 MG PO TABS: ORAL_TABLET | ORAL | 1 refills | Status: DC

## 2021-02-02 NOTE — Progress Notes (Signed)
  Patient is here for follow up visit.  Subjective:   @Patient ID: Holly [name], female    DOB: 06/01/1941, 79 y.o.   MRN: [id number]   Chief Complaint  Patient presents with  . Chronic systolic heart failure  . Follow-up    HPI  79-year-old African American female with hypertension, dual-chamber pacemaker placement for AV block, moderate, medically managed coronary artery disease (LCx), hypertension, NSVT, HFrEF  Patient is here with her daughter. Patient says "I don't feel good", and has not felt well for a while.  She has felt "shaky".  She has had exertional dyspnea and worsening leg swelling.  She was reportedly being worked up for unintentional weight loss by Dr. Ramchandran.  I do not havemost recent office visit note and labs performed at his office.  Current Outpatient Medications on File Prior to Visit  Medication Sig Dispense Refill  . ALPRAZolam (XANAX) 0.25 MG tablet Take 1 tablet by mouth daily as needed for anxiety.     . clobetasol ointment (TEMOVATE) 0.05 % Apply 1 application topically 2 (two) times daily as needed (itching).   1  . ELIQUIS 2.5 MG TABS tablet TAKE 1 TABLET BY MOUTH TWICE A DAY 60 tablet 6  . EUCRISA 2 % OINT Apply 1 application topically daily as needed (dry skin).     . losartan (COZAAR) 25 MG tablet TAKE 1 TABLET BY MOUTH EVERY DAY 90 tablet 1  . metoprolol succinate (TOPROL-XL) 50 MG 24 hr tablet Take 1 tablet (50 mg total) by mouth daily. Take with or immediately following a meal. 90 tablet 3  . nitroGLYCERIN (NITROSTAT) 0.4 MG SL tablet Place 1 tablet (0.4 mg total) under the tongue every 5 (five) minutes as needed for chest pain. 90 tablet 3  . OLOPATADINE HCL OP Place 1 drop into both eyes 2 (two) times daily.    . RABEprazole (ACIPHEX) 20 MG tablet Take 20 mg by mouth in the morning and at bedtime.    . rosuvastatin (CRESTOR) 10 MG tablet Take 1 tablet (10 mg total) by mouth daily. 90 tablet 2  . spironolactone (ALDACTONE) 25 MG tablet  Take 1 tablet (25 mg total) by mouth every other day. 30 tablet 6  . torsemide (DEMADEX) 20 MG tablet TAKE 1 TABLET BY MOUTH EVERY DAY 90 tablet 1  . triamcinolone cream (KENALOG) 0.1 % Apply topically 2 (two) times daily as needed.     No current facility-administered medications on file prior to visit.    Cardiovascular studies:  EKG 02/02/2021: Ventricular paced rhythm  Echocardiogram 08/22/2020:  Left ventricle cavity is moderately dilated. Mild concentric hypertrophy  of the left ventricle. Abnormal septal wall motion due to right ventricle  pacemaker. Severe global hypokinesis. LVEF 25-30%. Diastolic function not  assessed due to paced rhythm.   Left atrial cavity is severely dilated.  Right atrial cavity is mildly dilated.  Structurally normal trileaflet aortic valve. Mild (Grade I) aortic  regurgitation.  Moderate to severe mitral regurgitation.  Moderate tricuspid regurgitation.  No evidence of pulmonary hypertension.  Compared to previous study on 04/05/2019, there is further progression of  mitral and tricuspid valve regurgitation.   Scheduled Remote pacemaker check  07/31/2020:  There were 0 atrial high rate episodes detected. There were 0 high ventricular rate episodes detected. Health trends do not demonstrate significant abnormality. Battery longevity is 7.8 years. RA pacing is 64.5 %, RV pacing is 99.1 %. Atrial unipolar lead impedance warning currently at 380 Ohms. Stable trend   from previous on 07/04/20 and will continue to observe.  Lead trends over time is very stable.   Scheduled  In office pacemaker check 07/02/2020: Single (S)/Dual (D)/BV: D. Presenting A sensed V paced 74 bpm. Pacemaker dependant:  Yes. Underlying V paced at 30 bpm AP 19.8%, VP 99.8%. AMS Episodes 2.  AT/AF burden N/A% . Longest 4 hrs on 6/15 Afib. HVR 6. Longest 13 beat NSVT. Longevity 8.6 Years. Magnet rate: >85%. Lead measurements: Stable. Thoracic impedance: N/A. Histogram: Low  (L)/normal (N)/high (H)  Low. No rate response on. Patient activity <1 hr/day.   Observations: As above. Changes: Rate response   Lower extremity venous duplex 08/06/2018:  No evidence of deep vein thrombosis of the lower extremities with normal venous return.  Lexiscan myoview stress test 08/04/2017:  1. Non-diagnostic due to paced rhythm. 2. Study quality: good. The left ventricular chamber dimensions are normal. There is medium scar of the inferolateral segment with moderate to severe residual ischemia. There is small scar of the inferior segment with very mild residual ischemia. 3. Systolic function is mildly reduced. The calculated stress EF is at 44 %. Gated SPECT imaging demonstrates hypokinesis in the inferior, lateral segment(s). 5. Intermediate risk study. Clinical correlation recommended.  Coronary angiogram 11/11/2017: LM: Normal LAD: D1 40% stneoses LCx: Tandem 50% stenoses in mid LCx RCA: Normal  Moderate coronary artery disease. She does have abnormal stress test in inferolateral territory, they are out of proportion to the coronary artery findings. Furthermore, she does not have any critical unstable lesion, to explain nonsustained ventricular tachycardia. She does not have any ongoing chest pain symptoms at this time.Recommend aggressive medical management at this time. If true anginal symptoms occur, could then perform PCI to mid circumflex.  Pacemaker Implant 07/16/2017:  Medtronic Azure XT DR MRI Sure Scan (serial number [id number]) dual chamber pacemaker 07/16/2017 by Camnitz for complete heart block.  Recent labs: 10/05/2020: Glucose 99, BUN/Cr 31/1.6. EGFR 30.  HbA1C 5.7% Chol 135, TG 65, HDL 58, LDL 64 TSH 1.9  08/25/2020: Glucose 109, BUN/Cr 37/1.36. EGFR 37. Na/K 132/4.8.   07/20/2020: NT proBNP 9907  06/05/2020: Glucose 79, BUN/Cr 25/1.29. EGFR 45. Na/K 131/4.5 BNP 1854  04/26/2020: Glucose 97, BUN/Cr 29/1.75. EGFR 31. Na/K 129/4.0.  BNP 2647 (1271 on  04/06/2020)  04/06/2020: BNP 1371  03/22/2020: Glucose 91, BUN/Cr 22/1.18. EGFR 51. Na/K 139/4.1. Calcium 11.7. Rest of the CMP normal H/H 10.5/31.7. MCV 85. Platelets 220 BNP 2896  02/29/2020: Chol 102, TG 52, HDL 49, LDL 52  09/2019: BUN/Cr 33/1.3 HbA1C 5.3% TSH 2.6 normal   Lab Results  Component Value Date   TSH 1.987 07/15/2017    Review of Systems  Constitutional: Positive for malaise/fatigue.  Cardiovascular: Positive for dyspnea on exertion and leg swelling. Negative for chest pain, palpitations and syncope.         Objective:    Vitals:   02/02/21 1351  BP: 127/78  Pulse: 68  Temp: 98.2 F (36.8 C)  SpO2: 99%       Physical Exam Vitals and nursing note reviewed.  Constitutional:      General: She is not in acute distress. Neck:     Vascular: No JVD.  Cardiovascular:     Rate and Rhythm: Normal rate and regular rhythm.     Heart sounds: Normal heart sounds. No murmur heard.   Pulmonary:     Effort: Pulmonary effort is normal.     Breath sounds: Normal breath sounds. No wheezing or rales.    Musculoskeletal:     Right lower leg: Edema (2+) present.     Left lower leg: Edema (2+) present.           Assessment & Recommendations:   80 year old [place] American female with hypertension, dual-chamber pacemaker placement for AV block, moderate, medically managed coronary artery disease (LCx), hypertension, NSVT, HFrEF  Chronic systolic heart failure: Stable NYHA class II symptoms Serial echocardiograms showed further decline in her EF as well as worsening valvular regurgitation and chamber dilation. I had offered cath to exclude obstructive CAD and then consider CRT-P placement my Dr. Quentin Ore. However, considering her age and relative lack of symptoms, patient would like to hold off any invasive treatment.  Please torsemide to 20 mg daily.  I will obtain recent BMP performed at PCP office before increasing spironolactone dose. Continue losartan 25  mg daily, metoprolol succinate 50 mg daily.  Did not tolerate Entresto due to skin rash.   Echo performed recently, I will check TSH to evaluate for hypothyroidism as a unifying diagnosis for her "shakiness", unintentional weight loss.  Paroxysmal atrial fibrillation: CHA2DS2-VASc score 4, annual stroke risk 5%. Continue eiquis 2.5 mg twice daily.  Stopped aspirin at this time to avoid bleeding risk.  CAD: Continue statin, metoprolol succinate.  NSVT: Continue heart failure management as above  Hypertension:  Controlled.  If no reversible causes identified for her symptoms, we may need to consider palliative care given her worsening frailty.  F/u in 4 weeks  Eddy, MD [facility] [place] Pager: [phone] Office: [phone] If no answer Cell [phone]

## 2021-02-03 DIAGNOSIS — I5021 Acute systolic (congestive) heart failure: Secondary | ICD-10-CM | POA: Diagnosis not present

## 2021-02-26 ENCOUNTER — Other Ambulatory Visit: Payer: Self-pay | Admitting: Cardiology

## 2021-02-26 DIAGNOSIS — I25118 Atherosclerotic heart disease of native coronary artery with other forms of angina pectoris: Secondary | ICD-10-CM

## 2021-03-05 DIAGNOSIS — I5021 Acute systolic (congestive) heart failure: Secondary | ICD-10-CM | POA: Diagnosis not present

## 2021-03-13 DIAGNOSIS — T783XXA Angioneurotic edema, initial encounter: Secondary | ICD-10-CM | POA: Diagnosis not present

## 2021-03-13 DIAGNOSIS — R21 Rash and other nonspecific skin eruption: Secondary | ICD-10-CM | POA: Diagnosis not present

## 2021-03-14 ENCOUNTER — Ambulatory Visit: Payer: Medicare Other | Admitting: Cardiology

## 2021-03-19 ENCOUNTER — Ambulatory Visit: Payer: Medicare Other | Admitting: Cardiology

## 2021-03-19 NOTE — Progress Notes (Signed)
Rescheduled

## 2021-03-20 ENCOUNTER — Other Ambulatory Visit: Payer: Self-pay | Admitting: Cardiology

## 2021-03-20 DIAGNOSIS — R5383 Other fatigue: Secondary | ICD-10-CM | POA: Diagnosis not present

## 2021-03-20 DIAGNOSIS — I25118 Atherosclerotic heart disease of native coronary artery with other forms of angina pectoris: Secondary | ICD-10-CM

## 2021-03-20 DIAGNOSIS — R0602 Shortness of breath: Secondary | ICD-10-CM | POA: Diagnosis not present

## 2021-03-23 DIAGNOSIS — R1010 Upper abdominal pain, unspecified: Secondary | ICD-10-CM | POA: Diagnosis not present

## 2021-03-23 DIAGNOSIS — R5383 Other fatigue: Secondary | ICD-10-CM | POA: Diagnosis not present

## 2021-03-23 DIAGNOSIS — K5900 Constipation, unspecified: Secondary | ICD-10-CM | POA: Diagnosis not present

## 2021-03-23 DIAGNOSIS — R112 Nausea with vomiting, unspecified: Secondary | ICD-10-CM | POA: Diagnosis not present

## 2021-03-25 DIAGNOSIS — R42 Dizziness and giddiness: Secondary | ICD-10-CM | POA: Diagnosis not present

## 2021-03-26 ENCOUNTER — Emergency Department (HOSPITAL_COMMUNITY): Payer: Medicare Other

## 2021-03-26 ENCOUNTER — Inpatient Hospital Stay (HOSPITAL_COMMUNITY)
Admission: EM | Admit: 2021-03-26 | Discharge: 2021-04-08 | DRG: 981 | Disposition: A | Discharge status: Home / Self Care | Payer: Medicare Other | Attending: Internal Medicine | Admitting: Internal Medicine

## 2021-03-26 ENCOUNTER — Other Ambulatory Visit: Payer: Self-pay

## 2021-03-26 DIAGNOSIS — I5043 Acute on chronic combined systolic (congestive) and diastolic (congestive) heart failure: Secondary | ICD-10-CM | POA: Diagnosis not present

## 2021-03-26 DIAGNOSIS — I5084 End stage heart failure: Secondary | ICD-10-CM | POA: Diagnosis present

## 2021-03-26 DIAGNOSIS — K7689 Other specified diseases of liver: Secondary | ICD-10-CM | POA: Diagnosis not present

## 2021-03-26 DIAGNOSIS — N179 Acute kidney failure, unspecified: Secondary | ICD-10-CM | POA: Diagnosis not present

## 2021-03-26 DIAGNOSIS — Z515 Encounter for palliative care: Secondary | ICD-10-CM | POA: Diagnosis not present

## 2021-03-26 DIAGNOSIS — I4430 Unspecified atrioventricular block: Secondary | ICD-10-CM | POA: Diagnosis present

## 2021-03-26 DIAGNOSIS — Z7189 Other specified counseling: Secondary | ICD-10-CM

## 2021-03-26 DIAGNOSIS — I442 Atrioventricular block, complete: Secondary | ICD-10-CM | POA: Diagnosis not present

## 2021-03-26 DIAGNOSIS — I890 Lymphedema, not elsewhere classified: Secondary | ICD-10-CM | POA: Diagnosis present

## 2021-03-26 DIAGNOSIS — E43 Unspecified severe protein-calorie malnutrition: Secondary | ICD-10-CM | POA: Diagnosis not present

## 2021-03-26 DIAGNOSIS — I10 Essential (primary) hypertension: Secondary | ICD-10-CM | POA: Diagnosis present

## 2021-03-26 DIAGNOSIS — E785 Hyperlipidemia, unspecified: Secondary | ICD-10-CM | POA: Diagnosis not present

## 2021-03-26 DIAGNOSIS — I2510 Atherosclerotic heart disease of native coronary artery without angina pectoris: Secondary | ICD-10-CM | POA: Diagnosis present

## 2021-03-26 DIAGNOSIS — D539 Nutritional anemia, unspecified: Secondary | ICD-10-CM | POA: Diagnosis not present

## 2021-03-26 DIAGNOSIS — Z950 Presence of cardiac pacemaker: Secondary | ICD-10-CM | POA: Diagnosis present

## 2021-03-26 DIAGNOSIS — E872 Acidosis: Secondary | ICD-10-CM | POA: Diagnosis not present

## 2021-03-26 DIAGNOSIS — J9811 Atelectasis: Secondary | ICD-10-CM | POA: Diagnosis present

## 2021-03-26 DIAGNOSIS — I509 Heart failure, unspecified: Secondary | ICD-10-CM

## 2021-03-26 DIAGNOSIS — I5023 Acute on chronic systolic (congestive) heart failure: Secondary | ICD-10-CM | POA: Diagnosis not present

## 2021-03-26 DIAGNOSIS — N1831 Chronic kidney disease, stage 3a: Secondary | ICD-10-CM | POA: Diagnosis not present

## 2021-03-26 DIAGNOSIS — I5020 Unspecified systolic (congestive) heart failure: Secondary | ICD-10-CM

## 2021-03-26 DIAGNOSIS — I083 Combined rheumatic disorders of mitral, aortic and tricuspid valves: Secondary | ICD-10-CM | POA: Diagnosis present

## 2021-03-26 DIAGNOSIS — R531 Weakness: Secondary | ICD-10-CM

## 2021-03-26 DIAGNOSIS — K8070 Calculus of gallbladder and bile duct without cholecystitis without obstruction: Secondary | ICD-10-CM | POA: Diagnosis not present

## 2021-03-26 DIAGNOSIS — Z7901 Long term (current) use of anticoagulants: Secondary | ICD-10-CM | POA: Diagnosis not present

## 2021-03-26 DIAGNOSIS — R6881 Early satiety: Secondary | ICD-10-CM | POA: Diagnosis not present

## 2021-03-26 DIAGNOSIS — E860 Dehydration: Secondary | ICD-10-CM | POA: Diagnosis present

## 2021-03-26 DIAGNOSIS — I428 Other cardiomyopathies: Secondary | ICD-10-CM | POA: Diagnosis not present

## 2021-03-26 DIAGNOSIS — Z833 Family history of diabetes mellitus: Secondary | ICD-10-CM

## 2021-03-26 DIAGNOSIS — I5021 Acute systolic (congestive) heart failure: Secondary | ICD-10-CM | POA: Diagnosis present

## 2021-03-26 DIAGNOSIS — N170 Acute kidney failure with tubular necrosis: Principal | ICD-10-CM | POA: Diagnosis present

## 2021-03-26 DIAGNOSIS — M25551 Pain in right hip: Secondary | ICD-10-CM | POA: Diagnosis not present

## 2021-03-26 DIAGNOSIS — K8020 Calculus of gallbladder without cholecystitis without obstruction: Secondary | ICD-10-CM

## 2021-03-26 DIAGNOSIS — E876 Hypokalemia: Secondary | ICD-10-CM | POA: Diagnosis not present

## 2021-03-26 DIAGNOSIS — E440 Moderate protein-calorie malnutrition: Secondary | ICD-10-CM | POA: Insufficient documentation

## 2021-03-26 DIAGNOSIS — K5790 Diverticulosis of intestine, part unspecified, without perforation or abscess without bleeding: Secondary | ICD-10-CM | POA: Diagnosis not present

## 2021-03-26 DIAGNOSIS — I130 Hypertensive heart and chronic kidney disease with heart failure and stage 1 through stage 4 chronic kidney disease, or unspecified chronic kidney disease: Secondary | ICD-10-CM | POA: Diagnosis not present

## 2021-03-26 DIAGNOSIS — I472 Ventricular tachycardia: Secondary | ICD-10-CM | POA: Diagnosis not present

## 2021-03-26 DIAGNOSIS — I429 Cardiomyopathy, unspecified: Secondary | ICD-10-CM | POA: Diagnosis not present

## 2021-03-26 DIAGNOSIS — Z8249 Family history of ischemic heart disease and other diseases of the circulatory system: Secondary | ICD-10-CM

## 2021-03-26 DIAGNOSIS — Z95810 Presence of automatic (implantable) cardiac defibrillator: Secondary | ICD-10-CM

## 2021-03-26 DIAGNOSIS — I480 Paroxysmal atrial fibrillation: Secondary | ICD-10-CM | POA: Diagnosis present

## 2021-03-26 DIAGNOSIS — G479 Sleep disorder, unspecified: Secondary | ICD-10-CM | POA: Diagnosis present

## 2021-03-26 DIAGNOSIS — R64 Cachexia: Secondary | ICD-10-CM | POA: Diagnosis not present

## 2021-03-26 DIAGNOSIS — F419 Anxiety disorder, unspecified: Secondary | ICD-10-CM | POA: Diagnosis present

## 2021-03-26 DIAGNOSIS — Z20822 Contact with and (suspected) exposure to covid-19: Secondary | ICD-10-CM | POA: Diagnosis not present

## 2021-03-26 DIAGNOSIS — N281 Cyst of kidney, acquired: Secondary | ICD-10-CM | POA: Diagnosis not present

## 2021-03-26 DIAGNOSIS — Z888 Allergy status to other drugs, medicaments and biological substances status: Secondary | ICD-10-CM

## 2021-03-26 DIAGNOSIS — Z79899 Other long term (current) drug therapy: Secondary | ICD-10-CM

## 2021-03-26 LAB — CBC WITH DIFFERENTIAL/PLATELET
Abs Immature Granulocytes: 0.01 10*3/uL (ref 0.00–0.07)
Basophils Absolute: 0.1 10*3/uL (ref 0.0–0.1)
Basophils Relative: 2 %
Eosinophils Absolute: 0.2 10*3/uL (ref 0.0–0.5)
Eosinophils Relative: 5 %
HCT: 34 % — ABNORMAL LOW (ref 36.0–46.0)
Hemoglobin: 11.1 g/dL — ABNORMAL LOW (ref 12.0–15.0)
Immature Granulocytes: 0 %
Lymphocytes Relative: 19 %
Lymphs Abs: 0.8 10*3/uL (ref 0.7–4.0)
MCH: 27.8 pg (ref 26.0–34.0)
MCHC: 32.6 g/dL (ref 30.0–36.0)
MCV: 85 fL (ref 80.0–100.0)
Monocytes Absolute: 0.7 10*3/uL (ref 0.1–1.0)
Monocytes Relative: 16 %
Neutro Abs: 2.5 10*3/uL (ref 1.7–7.7)
Neutrophils Relative %: 58 %
Platelets: 223 10*3/uL (ref 150–400)
RBC: 4 MIL/uL (ref 3.87–5.11)
RDW: 15.9 % — ABNORMAL HIGH (ref 11.5–15.5)
WBC: 4.3 10*3/uL (ref 4.0–10.5)
nRBC: 0 % (ref 0.0–0.2)

## 2021-03-26 LAB — COMPREHENSIVE METABOLIC PANEL
ALT: 26 U/L (ref 0–44)
AST: 28 U/L (ref 15–41)
Albumin: 3.5 g/dL (ref 3.5–5.0)
Alkaline Phosphatase: 46 U/L (ref 38–126)
Anion gap: 12 (ref 5–15)
BUN: 75 mg/dL — ABNORMAL HIGH (ref 8–23)
CO2: 17 mmol/L — ABNORMAL LOW (ref 22–32)
Calcium: 11 mg/dL — ABNORMAL HIGH (ref 8.9–10.3)
Chloride: 105 mmol/L (ref 98–111)
Creatinine, Ser: 2.14 mg/dL — ABNORMAL HIGH (ref 0.44–1.00)
GFR, Estimated: 23 mL/min — ABNORMAL LOW (ref >60)
Glucose, Bld: 89 mg/dL (ref 70–99)
Potassium: 4.2 mmol/L (ref 3.5–5.1)
Sodium: 134 mmol/L — ABNORMAL LOW (ref 135–145)
Total Bilirubin: 1.8 mg/dL — ABNORMAL HIGH (ref 0.3–1.2)
Total Protein: 7.6 g/dL (ref 6.5–8.1)

## 2021-03-26 LAB — URINALYSIS, ROUTINE W REFLEX MICROSCOPIC
Bilirubin Urine: NEGATIVE
Glucose, UA: NEGATIVE mg/dL
Hgb urine dipstick: NEGATIVE
Ketones, ur: NEGATIVE mg/dL
Leukocytes,Ua: NEGATIVE
Nitrite: NEGATIVE
Protein, ur: NEGATIVE mg/dL
Specific Gravity, Urine: 1.008 (ref 1.005–1.030)
pH: 5 (ref 5.0–8.0)

## 2021-03-26 LAB — LIPASE, BLOOD: Lipase: 46 U/L (ref 11–51)

## 2021-03-26 MED ORDER — SODIUM CHLORIDE 0.9 % IV BOLUS
500.0000 mL | Freq: Once | INTRAVENOUS | Status: AC
  Administered 2021-03-26: 500 mL via INTRAVENOUS

## 2021-03-26 MED ORDER — HEPARIN SODIUM (PORCINE) 5000 UNIT/ML IJ SOLN: 5000.0000 [IU] | Freq: Three times a day (TID) | INTRAMUSCULAR | Status: DC

## 2021-03-26 NOTE — ED Triage Notes (Signed)
Pt  Here from home sent by her md was told to come to ED due to abnormal labs , she thinks it was kidney function

## 2021-03-26 NOTE — H&P (Addendum)
History and Physical    Holly [name] [id number] DOB: September 06, 1941 DOA: 03/26/2021  PCP: Merrilee Seashore, MD  Patient coming from: Home   I have personally briefly reviewed patient's old medical records in [place]  Chief Complaint: Abnormal labs  HPI: Holly [name] is a 80 y.o. 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  Patient saw her PCP earlier this week due to concerns of increasing shortness of breath both at rest and with exertion for the past 2 months.  She then had lab work done and was told it was abnormal and to present to the ED.  She believes it was a abnormal lab with her kidneys.  She reports that for the past 2 months she been needing to take more deep breaths when speaking and has had difficulty sleeping due to shortness of breath.  Has some lower extremity edema but feels it is no worse than usual.  She takes Eliquis for atrial fibrillation and periodically misses her her dose because she forgets to take it.  For the past month she has also noticed upper abdominal pain every time that she eats.  Has had decreased p.o. intake because of this pain and because of lack of appetite.  Denies any nausea, vomiting or diarrhea.  ED Course: She was afebrile and normotensive on room air.  Creatinine of 2.14 from a prior of 1.3.  Hemoglobin stable at 11 with a baseline of 10.  CT of the abdomen obtained shows contracted gallbladder with cholelithiasis but no cholecystitis.  There is also notable anasarca of the abdominal wall and edema of the mesenteric lymphatics.  Review of Systems:  Constitutional: No Weight Change, No Fever ENT/Mouth: No sore throat, No Rhinorrhea Eyes: No Eye Pain, No Vision Changes Cardiovascular: No Chest Pain, +  SOB,  + Edema Respiratory: No Cough, No Sputum, No Wheezing, no Dyspnea  Gastrointestinal: No Nausea, No Vomiting, No Diarrhea, No Constipation, No Pain Genitourinary: no Urinary Incontinence, No Urgency, No Flank Pain Musculoskeletal: No Arthralgias, No Myalgias Skin: No Skin Lesions, No Pruritus, Neuro: no Weakness, No Numbness,   Psych: No Anxiety/Panic, No Depression, no decrease appetite Heme/Lymph: No Bruising, No Bleeding  Past Medical History:  Diagnosis Date  . AV block, 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[place]) 07/15/2017  . Bronchitis   . Encounter for care of pacemaker 05/12/2019  . Hypertension   . Pacemaker - MRI safe Medtronic Azure XT DR MRI [id number] Dual chamber PPM in situ 04/24/2019    Past Surgical History:  Procedure Laterality Date  . LEFT HEART CATH AND CORONARY ANGIOGRAPHY N/A 11/11/2017   Procedure: LEFT HEART CATH AND CORONARY ANGIOGRAPHY;  Surgeon: Nigel Mormon, MD;  Location: [facility];  Service: Cardiovascular;  Laterality: N/A;  . left leg surgery    . PACEMAKER IMPLANT N/A 07/15/2017   Procedure: Pacemaker Implant;  Surgeon: Constance Haw, MD;  Location: [facility];  Service: Cardiovascular;  Laterality: N/A;     reports that she has never smoked. She has never used smokeless tobacco. She reports that she does not drink alcohol and does not use drugs. Social History  Allergies  Allergen Reactions  . Lisinopril Cough  . Entresto [Sacubitril-Valsartan] Itching, Rash and Cough  . Venlafaxine Rash    Family History  Problem Relation Age of  Onset  . Hypertension Mother   . ALS Sister   . Heart attack Brother   . Diabetes Daughter      Prior to Admission medications   Medication Sig Start Date End Date Taking? Authorizing Provider  ALPRAZolam Duanne Moron) 0.25 MG tablet Take 1 tablet by mouth daily as needed for anxiety.  01/14/19  Yes [provider]  apixaban (ELIQUIS) 2.5 MG TABS tablet Take 1 tablet by mouth in the morning and at  bedtime.   Yes [provider]  Ascorbic Acid (VITAMIN C) 1000 MG tablet Take 1,000 mg by mouth daily.   Yes [provider]  clobetasol ointment (TEMOVATE) [id number] % Apply 1 application topically 2 (two) times daily as needed (itching).  08/02/17  Yes [provider]  clotrimazole-betamethasone (LOTRISONE) cream 1 application in the morning and at bedtime. 01/16/17  Yes [provider]  EUCRISA 2 % OINT Apply 1 application topically daily as needed (dry skin).  01/14/19  Yes [provider]  losartan (COZAAR) 25 MG tablet TAKE 1 TABLET BY MOUTH EVERY DAY 01/29/21  Yes Patwardhan, Manish J, MD  metoprolol succinate (TOPROL-XL) 50 MG 24 hr tablet Take 1 tablet (50 mg total) by mouth daily. Take with or immediately following a meal. 04/21/20  Yes Patwardhan, Manish J, MD  OLOPATADINE HCL OP Place 1 drop into both eyes 2 (two) times daily.   Yes [provider]  rosuvastatin (CRESTOR) 10 MG tablet TAKE 1 TABLET BY MOUTH EVERY DAY 03/20/21  Yes Patwardhan, Manish J, MD  spironolactone (ALDACTONE) 25 MG tablet Take 1 tablet (25 mg total) by mouth every other day. 01/10/21 04/10/21 Yes Patwardhan, Reynold Bowen, MD  torsemide (DEMADEX) 20 MG tablet Daily 02/02/21  Yes Patwardhan, Manish J, MD  triamcinolone cream (KENALOG) 0.1 % Apply topically 2 (two) times daily as needed. 06/01/20  Yes [provider]  nitroGLYCERIN (NITROSTAT) 0.4 MG SL tablet Place 1 tablet (0.4 mg total) under the tongue every 5 (five) minutes as needed for chest pain. 09/23/19 09/13/20  Nigel Mormon, MD    Physical Exam: Vitals:   03/26/21 1456 03/26/21 1826 03/26/21 2006 03/26/21 2200  BP: 102/72 96/64 112/77 114/72  Pulse: 60 63 60   Resp: '16 16 18 '$ (!) 22  Temp: 97.8 F (36.6 C)  97.6 F (36.4 C)   TempSrc: Oral  Oral   SpO2: 100% 100% 100%     Constitutional: NAD, calm, comfortable, thin cachectic elderly female laying at 30 degree incline in bed Vitals:   03/26/21  1456 03/26/21 1826 03/26/21 2006 03/26/21 2200  BP: 102/72 96/64 112/77 114/72  Pulse: 60 63 60   Resp: '16 16 18 '$ (!) 22  Temp: 97.8 F (36.6 C)  97.6 F (36.4 C)   TempSrc: Oral  Oral   SpO2: 100% 100% 100%    Eyes: PERRL, lids and conjunctivae normal ENMT: Mucous membranes are moist.  Neck: normal, supple Respiratory: clear to auscultation bilaterally, no wheezing, no crackles. Normal respiratory effort. No accessory muscle use.  Cardiovascular: Regular rate and rhythm, no murmurs / rubs / gallops.  +2 pitting edema of the distal pretibial region and ankle bilateral lower extremity. 2+ pedal pulses. No JVD.  Abdomen: no tenderness, no masses palpated.  Bowel sounds positive.  No flank pain. Musculoskeletal: no clubbing / cyanosis. No joint deformity upper and lower extremities. Good ROM, no contractures. Normal muscle tone.  Skin: no rashes, lesions, ulcers. No induration Neurologic: CN 2-12 grossly intact. Sensation intact,  Strength 5/5 in all 4.  Psychiatric: Normal judgment and insight. Alert and oriented x 3. Normal mood.     Labs on Admission: I have personally reviewed following labs and imaging studies  CBC: Recent Labs  Lab 03/26/21 1523  WBC 4.3  NEUTROABS 2.5  HGB 11.1*  HCT 34.0*  MCV 85.0  PLT [id number]   Basic Metabolic Panel: Recent Labs  Lab 03/26/21 1523  NA 134*  K 4.2  CL 105  CO2 17*  GLUCOSE 89  BUN 75*  CREATININE 2.14*  CALCIUM 11.0*   GFR: CrCl cannot be calculated (Unknown ideal weight.). Liver Function Tests: Recent Labs  Lab 03/26/21 1523  AST 28  ALT 26  ALKPHOS 46  BILITOT 1.8*  PROT 7.6  ALBUMIN 3.5   Recent Labs  Lab 03/26/21 1523  LIPASE 46   No results for input(s): AMMONIA in the last 168 hours. Coagulation Profile: No results for input(s): INR, PROTIME in the last 168 hours. Cardiac Enzymes: No results for input(s): CKTOTAL, CKMB, CKMBINDEX, TROPONINI in the last 168 hours. BNP (last 3 results) Recent Labs     07/20/20 1201  PROBNP 9,907*   HbA1C: No results for input(s): HGBA1C in the last 72 hours. CBG: No results for input(s): GLUCAP in the last 168 hours. Lipid Profile: No results for input(s): CHOL, HDL, LDLCALC, TRIG, CHOLHDL, LDLDIRECT in the last 72 hours. Thyroid Function Tests: No results for input(s): TSH, T4TOTAL, FREET4, T3FREE, THYROIDAB in the last 72 hours. Anemia Panel: No results for input(s): VITAMINB12, FOLATE, FERRITIN, TIBC, IRON, RETICCTPCT in the last 72 hours. Urine analysis:    Component Value Date/Time   COLORURINE STRAW (A) 07/09/2017 0010   APPEARANCEUR CLEAR 07/09/2017 0010   LABSPEC 1.004 (L) 07/09/2017 0010   PHURINE 6.0 07/09/2017 0010   GLUCOSEU NEGATIVE 07/09/2017 0010   HGBUR NEGATIVE 07/09/2017 0010   BILIRUBINUR NEGATIVE 07/09/2017 0010   KETONESUR NEGATIVE 07/09/2017 0010   PROTEINUR NEGATIVE 07/09/2017 0010   UROBILINOGEN 0.2 07/11/2009 1942   NITRITE NEGATIVE 07/09/2017 0010   LEUKOCYTESUR NEGATIVE 07/09/2017 0010    Radiological Exams on Admission: CT ABDOMEN PELVIS WO CONTRAST  Result Date: 03/26/2021 CLINICAL DATA:  Presenting for abnormal labs,, mechanical fall as well. EXAM: CT ABDOMEN AND PELVIS WITHOUT CONTRAST TECHNIQUE: Multidetector CT imaging of the abdomen and pelvis was performed following the standard protocol without IV contrast. COMPARISON:  CT 07/09/2017 FINDINGS: Lower chest: Chronic subpleural reticular changes noted in the lung bases. Additional atelectasis. Lung bases are otherwise clear. Cardiomegaly. Terminus of a pacer lead seen at the cardiac apex. No pericardial effusion. Hepatobiliary: Cyst again seen along the inferomedial liver measuring 1.9 cm in size (3/20), not significantly changed from prior. No concerning focal liver lesion. Punctate calcification in the left lobe liver, nonspecific. Normal hepatic attenuation. Smooth liver surface contour. Gallbladder appears contracted around a calcified gallstone measuring up  to 2.2 cm in size. No pericholecystic fluid or inflammation. No significant biliary ductal dilatation accounting for expected senescent change. No intraductal gallstones. Pancreas: No pancreatic ductal dilatation or surrounding inflammatory changes. Spleen: Normal in size. No concerning splenic lesions. Adrenals/Urinary Tract: Normal adrenal glands. Redemonstration of a cyst arising the upper pole right kidney measuring 3.8 cm in size, decreased from comparison prior. Intermediate attenuation 11 mm cyst seen in the posterior upper pole left kidney (3/16) could reflect a hemorrhagic or proteinaceous cyst though incompletely characterized on this exam. No other visible or contour deforming renal lesions. No urolithiasis or hydronephrosis. Urinary bladder is unremarkable  for degree of distension. Stomach/Bowel: Distal esophagus, stomach and duodenal sweep are unremarkable. No small bowel wall thickening or dilatation. No evidence of obstruction. Appendix is not visualized. No focal inflammation the vicinity of the cecum to suggest an occult appendicitis. Scattered colonic diverticula without focal inflammation to suggest diverticulitis. No colonic dilatation or wall thickening. Vascular/Lymphatic: Atherosclerotic calcifications within the abdominal aorta and branch vessels. No aneurysm or ectasia. Numerous though nonenlarged retroperitoneal nodes are seen. Possibly reactive or edematous. No pathologically enlarged abdominopelvic lymph nodes within the limitations of this unenhanced CT. Reproductive: Anteverted uterus. Benign myometrial vascular calcifications. No suspicious adnexal lesions. Other: Extensive circumferential body wall edema. Mild central mesenteric edematous changes. Musculoskeletal: The osseous structures appear diffusely demineralized which may limit detection of small or nondisplaced fractures. No acute fracture or vertebral body height loss. Dextrocurvature of the thoracolumbar spine, apex L3. Six  lumbar vertebrae. Lowest disc space denoted as L6-S1. Multilevel degenerative changes are present in the imaged portions of the spine. Severe sclerotic changes and bony remodeling of the bilateral femoral heads likely reflecting sequela of prior avascular necrosis advanced arthrosis with bilateral acetabular protrusio, right greater than left. Some slight joint space expansion and synovial thickening noted about the right hip with few joint bodies. Additional joint bodies are noted in the left hip articulation but without the same extent of synovia prominence. No acute fracture or traumatic osseous injury of hips, pelvis. IMPRESSION: 1. No clear acute traumatic findings in the abdomen or pelvis. Unenhanced CT was performed per clinician order. Lack of IV contrast limits sensitivity and specificity, especially for evaluation of abdominal/pelvic solid viscera. 2. Features of developing anasarca with body wall edema, mesenteric edema and likely edematous central mesenteric lymph nodes. Cardiomegaly noted as well. Correlate with patient hydration status or clinical features of heart failure/volume overload. 3. Indeterminate 11 mm hyperdense cystic focus in the upper pole left kidney. Suspect benign hemorrhagic or proteinaceous cyst though incompletely characterized. Consider renal ultrasound or MRI for further characterization 4. No other acute or worrisome urinary tract abnormality. 5. Cholelithiasis without evidence of acute cholecystitis. 6. Diverticulosis without evidence of acute diverticulitis. 7. Aortic Atherosclerosis (ICD10-I70.0). 8. Progressive and severe degenerative changes in the bilateral hips including features of prior avascular necrosis. Right greater than left synovial thickening and effusion, sterility is not completely ascertained on imaging. If there is clinical concern, fluid sampling should be considered. Electronically Signed   By: Lovena Le M.D.   On: 03/26/2021 22:05   DG Hip Unilat W or  Wo Pelvis 2-3 Views Right  Result Date: 03/26/2021 CLINICAL DATA:  80 year old female with fall and right hip pain. EXAM: DG HIP (WITH OR WITHOUT PELVIS) 2-3V RIGHT COMPARISON:  Pelvic radiograph dated 10/07/2018 FINDINGS: There is no acute fracture or dislocation. The bones are osteopenic. Severe bilateral hip arthritic changes with flattening of the left femoral head and right protrusio acetabuli. Degenerative changes of the visualized lower lumbar spine. The soft tissues are unremarkable. IMPRESSION: 1. No acute fracture or dislocation. 2. 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 Electronically Signed   By: Anner Crete M.D.   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  Patient has +2  pitting edema on exam and has dyspnea. BNP obtained >4500 - on home Torsemide '20mg'$ , Will do IV lasix '60mg'$  daily -Strict I's and O's - Daily weights - repeat Echo  Cholelithiasis -Patient has been having abdominal pain for the past month with food.  No cholecystitis noted on CT.   -Consider general surgery consult during this admission to discuss elective cholecystectomy  Paroxysmal atrial fibrillation - Continue Eliquis  Left upper pole kidney cyst - Incidental finding on CT abdomen - Cysts suspected to be benign hemorrhagic or proteinaceous cyst - Obtain renal ultrasound for further characterization  History of heart block s/p pacemaker - Stable  Hypertension  -hold Losartan due to AKI -continue metoprolol  Hyperlipidemia -continue statin  DVT prophylaxis:.Subcu heparin Code Status: Full Family Communication: Plan discussed with  patient and granddaughter at bedside  disposition Plan: Home with observation Consults called:  Admission status: Observation Level of care: Telemetry Cardiac  Status is: Observation  The patient remains OBS appropriate and will d/c before 2 midnights.  Dispo: The patient is from: Home              Anticipated d/c is to: Home              Patient currently is not medically stable to d/c.   Difficult to place patient No         Orene Desanctis DO [facility]   If 7PM-7AM, please contact night-coverage [email or web address]   03/26/2021, 11:15 PM

## 2021-03-26 NOTE — ED Provider Notes (Signed)
Emergency Medicine Provider Triage Evaluation Note  Holly [name] , a 80 y.o. female  was evaluated in triage.  Pt complains of abnormal labwork. Saw PCP on 03/23/2021 and sent for abnormal.  Lab work.  States that this could be her kidney but she is unsure. She reports mechanical fall this morning after stumbling on her grandchild on the floor. No loss of consciousness or head injury.  Review of Systems  Positive: fall Negative: Headache, vomiting, chest pain  Physical Exam  BP 102/72 (BP Location: Left Arm)   Pulse 60   Temp 97.8 F (36.6 C) (Oral)   Resp 16   SpO2 100%  Gen:   Awake, no distress   Resp:  Normal effort  MSK:   Moves extremities without difficulty  Other:  No facial asymmetry  Medical Decision Making  Medically screening exam initiated at 3:23 PM.  Appropriate orders placed.  Holly [name] was informed that the remainder of the evaluation will be completed by another provider, this initial triage assessment does not replace that evaluation, and the importance of remaining in the ED until their evaluation is complete.  Unable to view PCP labs with quick chart review. Will reorder CBC, CMP.   Delia Heady, PA-C 03/26/21 [place], [place], DO 03/26/21 1614

## 2021-03-26 NOTE — ED Provider Notes (Signed)
[facility] EMERGENCY DEPARTMENT Provider Note   CSN: [place]:[id number] Arrival date & time: 03/26/21  1416     History No chief complaint on file.   Holly [name] is a 80 y.o. female.  HPI   80 year old female history of third-degree heart block status post pacemaker, hypertension, hyperlipidemia, paroxysmal A. fib, CAD presents today with reports of abnormal labs.  Patient is followed by [name], she reports that she was called and told that she needed to come to the hospital due to abnormal labs.  She reports that she has been having some right-sided abdominal pain.  It was somewhat worsened with eating.  She feels that she has had early satiety but has not had vomiting.  She went into see [name] for her usual visit and had labs obtained.  Past Medical History:  Diagnosis Date  . AV block, 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[place]) 07/15/2017  . Bronchitis   . Encounter for care of pacemaker 05/12/2019  . Hypertension   . Pacemaker - MRI safe Medtronic Azure XT DR MRI [id number] Dual chamber PPM in situ 04/24/2019    Patient Active Problem List   Diagnosis Date Noted  . Chronic systolic heart failure ([place]) 11/14/2020  . Paroxysmal A-fib ([place]) 05/04/2020  . HFrEF (heart failure with reduced ejection fraction) ([place]) 04/21/2020  . Acute systolic heart failure ([place]) 04/10/2020  . Nonrheumatic mitral valve regurgitation 04/10/2020  . Elevated brain natriuretic peptide (BNP) level 03/30/2020  . Leg edema 03/30/2020  . SOB (shortness of breath) 03/15/2020  . 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  . NSVT (nonsustained ventricular tachycardia) ([place]) 09/23/2019  . Encounter for care of pacemaker 05/12/2019  . Coronary artery disease of native artery of native heart with stable angina pectoris ([place]) 04/24/2019  . Essential hypertension 04/24/2019  . Abnormal stress test 11/08/2017  . 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[place]) 07/15/2017  . Pacemaker - MRI safe Medtronic Azure XT DR MRI  [id number] Dual chamber PPM in situ 07/15/2017  . 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 Carpal tunnel syndrome 08/09/2013    Past Surgical History:  Procedure Laterality Date  . LEFT HEART CATH AND CORONARY ANGIOGRAPHY N/A 11/11/2017   Procedure: LEFT HEART CATH AND CORONARY ANGIOGRAPHY;  Surgeon: Nigel Mormon, MD;  Location: [facility];  Service: Cardiovascular;  Laterality: N/A;  . left leg surgery    . PACEMAKER IMPLANT N/A 07/15/2017   Procedure: Pacemaker Implant;  Surgeon: Constance Haw, MD;  Location: [facility];  Service: Cardiovascular;  Laterality: N/A;     OB History   No obstetric history on file.     Family History  Problem Relation Age of Onset  . Hypertension Mother   . ALS Sister   . Heart attack Brother   . Diabetes Daughter     Social History   Tobacco Use  . Smoking status: Never Smoker  . Smokeless tobacco: Never Used  Vaping Use  . Vaping Use: Never used  Substance Use Topics  . Alcohol use: No  . Drug use: Never    Home Medications Prior to Admission medications   Medication Sig Start Date End Date Taking? Authorizing Provider  ALPRAZolam Duanne Moron) 0.25 MG tablet Take 1 tablet by mouth daily as needed for anxiety.  01/14/19   [provider]  apixaban (ELIQUIS) 2.5 MG TABS tablet Take 1 tablet by mouth in the morning and at bedtime.    [provider]  Ascorbic Acid (VITAMIN C) 1000 MG tablet Take 1,000 mg by mouth daily.    [provider]  clobetasol ointment (TEMOVATE) [id number] % Apply 1 application topically 2 (two) times daily as needed (itching).  08/02/17   [provider]  clotrimazole-betamethasone (LOTRISONE) cream 1 application in the morning and at bedtime. 01/16/17   [provider]  EUCRISA 2 % OINT Apply 1 application topically daily as needed (dry skin).  01/14/19   [provider]  losartan  (COZAAR) 25 MG tablet TAKE 1 TABLET BY MOUTH EVERY DAY 01/29/21   Patwardhan, Manish J, MD  metoprolol succinate (TOPROL-XL) 50 MG 24 hr tablet Take 1 tablet (50 mg total) by mouth daily. Take with or immediately following a meal. 04/21/20   Patwardhan, Manish J, MD  nitroGLYCERIN (NITROSTAT) 0.4 MG SL tablet Place 1 tablet (0.4 mg total) under the tongue every 5 (five) minutes as needed for chest pain. 09/23/19 09/13/20  Patwardhan, Reynold Bowen, MD  OLOPATADINE HCL OP Place 1 drop into both eyes 2 (two) times daily.    [provider]  rosuvastatin (CRESTOR) 10 MG tablet TAKE 1 TABLET BY MOUTH EVERY DAY 03/20/21   Patwardhan, Reynold Bowen, MD  spironolactone (ALDACTONE) 25 MG tablet Take 1 tablet (25 mg total) by mouth every other day. 01/10/21 04/10/21  Nigel Mormon, MD  torsemide (DEMADEX) 20 MG tablet Daily 02/02/21   Patwardhan, Reynold Bowen, MD  triamcinolone cream (KENALOG) 0.1 % Apply topically 2 (two) times daily as needed. 06/01/20   [provider]    Allergies    Lisinopril, Entresto [sacubitril-valsartan], and Venlafaxine  Review of Systems   Review of Systems  All other systems reviewed and are negative.   Physical Exam Updated Vital Signs BP 114/72   Pulse 60   Temp 97.6 F (36.4 C) (Oral)   Resp (!) 22   SpO2 100%   Physical Exam Vitals and nursing note reviewed.  Constitutional:      General: She is not in acute distress.    Appearance: Normal appearance.  HENT:     Head: Normocephalic.     Right Ear: External ear normal.     Left Ear: External ear normal.     Nose: Nose normal.     Mouth/Throat:     Mouth: Mucous membranes are moist.  Eyes:     Pupils: Pupils are equal, round, and reactive to light.  Cardiovascular:     Rate and Rhythm: Normal rate and regular rhythm.     Pulses: Normal pulses.  Pulmonary:     Effort: Pulmonary effort is normal.     Breath sounds: Normal breath sounds.  Abdominal:     General: Abdomen is flat.     Palpations:  Abdomen is soft. There is no mass.     Tenderness: There is abdominal tenderness. There is no rebound.     Hernia: No hernia is present.     Comments: Mild tenderness in the right flank area  Musculoskeletal:        General: Normal range of motion.     Cervical back: Normal range of motion.  Skin:    General: Skin is warm and dry.     Capillary Refill: Capillary refill takes less than 2 seconds.  Neurological:     General: No focal deficit present.     Mental Status: She is alert.  Psychiatric:        Mood and Affect: Mood normal.  ED Results / Procedures / Treatments   Labs (all labs ordered are listed, but only abnormal results are displayed) Labs Reviewed  COMPREHENSIVE METABOLIC PANEL - Abnormal; Notable for the following components:      Result Value   Sodium 134 (*)    CO2 17 (*)    BUN 75 (*)    Creatinine, Ser 2.14 (*)    Calcium 11.0 (*)    Total Bilirubin 1.8 (*)    GFR, Estimated 23 (*)    All other components within normal limits  CBC WITH DIFFERENTIAL/PLATELET - Abnormal; Notable for the following components:   Hemoglobin 11.1 (*)    HCT 34.0 (*)    RDW 15.9 (*)    All other components within normal limits  URINALYSIS, ROUTINE W REFLEX MICROSCOPIC  LIPASE, BLOOD    EKG EKG Interpretation  Date/Time:  Monday Mar 26 2021 15:34:23 EDT Ventricular Rate:  68 PR Interval:  178 QRS Duration: 166 QT Interval:  510 QTC Calculation: 542 R Axis:   -67 Text Interpretation: AV dual-paced rhythm Abnormal ECG Confirmed by Pattricia Boss [phone]) on 03/26/2021 8:49:04 PM   Radiology CT ABDOMEN PELVIS WO CONTRAST  Result Date: 03/26/2021 CLINICAL DATA:  Presenting for abnormal labs,, mechanical fall as well. EXAM: CT ABDOMEN AND PELVIS WITHOUT CONTRAST TECHNIQUE: Multidetector CT imaging of the abdomen and pelvis was performed following the standard protocol without IV contrast. COMPARISON:  CT 07/09/2017 FINDINGS: Lower chest: Chronic subpleural reticular changes  noted in the lung bases. Additional atelectasis. Lung bases are otherwise clear. Cardiomegaly. Terminus of a pacer lead seen at the cardiac apex. No pericardial effusion. Hepatobiliary: Cyst again seen along the inferomedial liver measuring 1.9 cm in size (3/20), not significantly changed from prior. No concerning focal liver lesion. Punctate calcification in the left lobe liver, nonspecific. Normal hepatic attenuation. Smooth liver surface contour. Gallbladder appears contracted around a calcified gallstone measuring up to 2.2 cm in size. No pericholecystic fluid or inflammation. No significant biliary ductal dilatation accounting for expected senescent change. No intraductal gallstones. Pancreas: No pancreatic ductal dilatation or surrounding inflammatory changes. Spleen: Normal in size. No concerning splenic lesions. Adrenals/Urinary Tract: Normal adrenal glands. Redemonstration of a cyst arising the upper pole right kidney measuring 3.8 cm in size, decreased from comparison prior. Intermediate attenuation 11 mm cyst seen in the posterior upper pole left kidney (3/16) could reflect a hemorrhagic or proteinaceous cyst though incompletely characterized on this exam. No other visible or contour deforming renal lesions. No urolithiasis or hydronephrosis. Urinary bladder is unremarkable for degree of distension. Stomach/Bowel: Distal esophagus, stomach and duodenal sweep are unremarkable. No small bowel wall thickening or dilatation. No evidence of obstruction. Appendix is not visualized. No focal inflammation the vicinity of the cecum to suggest an occult appendicitis. Scattered colonic diverticula without focal inflammation to suggest diverticulitis. No colonic dilatation or wall thickening. Vascular/Lymphatic: Atherosclerotic calcifications within the abdominal aorta and branch vessels. No aneurysm or ectasia. Numerous though nonenlarged retroperitoneal nodes are seen. Possibly reactive or edematous. No  pathologically enlarged abdominopelvic lymph nodes within the limitations of this unenhanced CT. Reproductive: Anteverted uterus. Benign myometrial vascular calcifications. No suspicious adnexal lesions. Other: Extensive circumferential body wall edema. Mild central mesenteric edematous changes. Musculoskeletal: The osseous structures appear diffusely demineralized which may limit detection of small or nondisplaced fractures. No acute fracture or vertebral body height loss. Dextrocurvature of the thoracolumbar spine, apex L3. Six lumbar vertebrae. Lowest disc space denoted as L6-S1. Multilevel degenerative changes are present in the imaged portions  of the spine. Severe sclerotic changes and bony remodeling of the bilateral femoral heads likely reflecting sequela of prior avascular necrosis advanced arthrosis with bilateral acetabular protrusio, right greater than left. Some slight joint space expansion and synovial thickening noted about the right hip with few joint bodies. Additional joint bodies are noted in the left hip articulation but without the same extent of synovia prominence. No acute fracture or traumatic osseous injury of hips, pelvis. IMPRESSION: 1. No clear acute traumatic findings in the abdomen or pelvis. Unenhanced CT was performed per clinician order. Lack of IV contrast limits sensitivity and specificity, especially for evaluation of abdominal/pelvic solid viscera. 2. Features of developing anasarca with body wall edema, mesenteric edema and likely edematous central mesenteric lymph nodes. Cardiomegaly noted as well. Correlate with patient hydration status or clinical features of heart failure/volume overload. 3. Indeterminate 11 mm hyperdense cystic focus in the upper pole left kidney. Suspect benign hemorrhagic or proteinaceous cyst though incompletely characterized. Consider renal ultrasound or MRI for further characterization 4. No other acute or worrisome urinary tract abnormality. 5.  Cholelithiasis without evidence of acute cholecystitis. 6. Diverticulosis without evidence of acute diverticulitis. 7. Aortic Atherosclerosis (ICD10-I70.0). 8. Progressive and severe degenerative changes in the bilateral hips including features of prior avascular necrosis. Right greater than left synovial thickening and effusion, sterility is not completely ascertained on imaging. If there is clinical concern, fluid sampling should be considered. Electronically Signed   By: Lovena Le M.D.   On: 03/26/2021 22:05   DG Hip Unilat W or Wo Pelvis 2-3 Views Right  Result Date: 03/26/2021 CLINICAL DATA:  80 year old female with fall and right hip pain. EXAM: DG HIP (WITH OR WITHOUT PELVIS) 2-3V RIGHT COMPARISON:  Pelvic radiograph dated 10/07/2018 FINDINGS: There is no acute fracture or dislocation. The bones are osteopenic. Severe bilateral hip arthritic changes with flattening of the left femoral head and right protrusio acetabuli. Degenerative changes of the visualized lower lumbar spine. The soft tissues are unremarkable. IMPRESSION: 1. No acute fracture or dislocation. 2. 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 Electronically Signed   By: Anner Crete M.D.   On: 03/26/2021 16:42    Procedures Procedures   Medications Ordered in ED Medications  sodium chloride 0.9 % bolus 500 mL (has no administration in time range)    ED Course  I have reviewed the triage vital signs and the nursing notes.  Pertinent labs & imaging results that were available during my care of the patient were reviewed by me and considered in my medical decision making (see chart for details).    MDM Rules/Calculators/A&P                          Patient presents today with right flank pain and AKI with creatinine 2.14. She is receiving IV fluids here.  She is not having any ongoing pain.  CT obtained significant for cholelithiasis with contracted gallbladder but no evidence  of acute cholecystitis, question of developing anasarca, determinant 11 mm hyperdense cystic focus in the upper pole of the left kidney suspected to be a benign hemorrhagic or proteinaceous cyst. Suspect that patient has had pain from her gallstone and is volume contracted secondary to this.  She is receiving some IV fluids here in the ED.  Plan admission to hospitalist for further evaluation and treatment Discussed with Dr. Flossie Buffy who will see for admission Final Clinical Impression(s) / ED Diagnoses Final diagnoses:  AKI (acute kidney injury) ([place])  Calculus of gallbladder without cholecystitis without obstruction    Rx / DC Orders ED Discharge Orders    None       Pattricia Boss, MD 03/26/21 2305

## 2021-03-27 ENCOUNTER — Observation Stay (HOSPITAL_COMMUNITY): Payer: Medicare Other

## 2021-03-27 DIAGNOSIS — I5043 Acute on chronic combined systolic (congestive) and diastolic (congestive) heart failure: Secondary | ICD-10-CM

## 2021-03-27 DIAGNOSIS — E860 Dehydration: Secondary | ICD-10-CM | POA: Diagnosis present

## 2021-03-27 DIAGNOSIS — E872 Acidosis: Secondary | ICD-10-CM | POA: Diagnosis not present

## 2021-03-27 DIAGNOSIS — R64 Cachexia: Secondary | ICD-10-CM | POA: Diagnosis present

## 2021-03-27 DIAGNOSIS — I10 Essential (primary) hypertension: Secondary | ICD-10-CM | POA: Diagnosis not present

## 2021-03-27 DIAGNOSIS — N179 Acute kidney failure, unspecified: Secondary | ICD-10-CM | POA: Diagnosis not present

## 2021-03-27 DIAGNOSIS — R269 Unspecified abnormalities of gait and mobility: Secondary | ICD-10-CM | POA: Diagnosis not present

## 2021-03-27 DIAGNOSIS — I442 Atrioventricular block, complete: Secondary | ICD-10-CM | POA: Diagnosis not present

## 2021-03-27 DIAGNOSIS — N1831 Chronic kidney disease, stage 3a: Secondary | ICD-10-CM | POA: Diagnosis present

## 2021-03-27 DIAGNOSIS — E785 Hyperlipidemia, unspecified: Secondary | ICD-10-CM | POA: Diagnosis present

## 2021-03-27 DIAGNOSIS — Z95810 Presence of automatic (implantable) cardiac defibrillator: Secondary | ICD-10-CM | POA: Diagnosis not present

## 2021-03-27 DIAGNOSIS — Z20822 Contact with and (suspected) exposure to covid-19: Secondary | ICD-10-CM | POA: Diagnosis present

## 2021-03-27 DIAGNOSIS — J9811 Atelectasis: Secondary | ICD-10-CM | POA: Diagnosis not present

## 2021-03-27 DIAGNOSIS — I2510 Atherosclerotic heart disease of native coronary artery without angina pectoris: Secondary | ICD-10-CM | POA: Diagnosis not present

## 2021-03-27 DIAGNOSIS — I4430 Unspecified atrioventricular block: Secondary | ICD-10-CM | POA: Diagnosis present

## 2021-03-27 DIAGNOSIS — Z7901 Long term (current) use of anticoagulants: Secondary | ICD-10-CM | POA: Diagnosis not present

## 2021-03-27 DIAGNOSIS — I5021 Acute systolic (congestive) heart failure: Secondary | ICD-10-CM | POA: Diagnosis not present

## 2021-03-27 DIAGNOSIS — Z515 Encounter for palliative care: Secondary | ICD-10-CM | POA: Diagnosis not present

## 2021-03-27 DIAGNOSIS — R531 Weakness: Secondary | ICD-10-CM | POA: Diagnosis not present

## 2021-03-27 DIAGNOSIS — I428 Other cardiomyopathies: Secondary | ICD-10-CM | POA: Diagnosis present

## 2021-03-27 DIAGNOSIS — I083 Combined rheumatic disorders of mitral, aortic and tricuspid valves: Secondary | ICD-10-CM | POA: Diagnosis present

## 2021-03-27 DIAGNOSIS — I509 Heart failure, unspecified: Secondary | ICD-10-CM

## 2021-03-27 DIAGNOSIS — E43 Unspecified severe protein-calorie malnutrition: Secondary | ICD-10-CM | POA: Diagnosis present

## 2021-03-27 DIAGNOSIS — I5023 Acute on chronic systolic (congestive) heart failure: Secondary | ICD-10-CM | POA: Diagnosis not present

## 2021-03-27 DIAGNOSIS — K8020 Calculus of gallbladder without cholecystitis without obstruction: Secondary | ICD-10-CM

## 2021-03-27 DIAGNOSIS — I472 Ventricular tachycardia: Secondary | ICD-10-CM | POA: Diagnosis present

## 2021-03-27 DIAGNOSIS — D539 Nutritional anemia, unspecified: Secondary | ICD-10-CM | POA: Diagnosis present

## 2021-03-27 DIAGNOSIS — I429 Cardiomyopathy, unspecified: Secondary | ICD-10-CM | POA: Diagnosis not present

## 2021-03-27 DIAGNOSIS — Z950 Presence of cardiac pacemaker: Secondary | ICD-10-CM | POA: Diagnosis not present

## 2021-03-27 DIAGNOSIS — I5084 End stage heart failure: Secondary | ICD-10-CM | POA: Diagnosis not present

## 2021-03-27 DIAGNOSIS — N170 Acute kidney failure with tubular necrosis: Secondary | ICD-10-CM | POA: Diagnosis present

## 2021-03-27 DIAGNOSIS — K8070 Calculus of gallbladder and bile duct without cholecystitis without obstruction: Secondary | ICD-10-CM | POA: Diagnosis present

## 2021-03-27 DIAGNOSIS — M150 Primary generalized (osteo)arthritis: Secondary | ICD-10-CM | POA: Diagnosis not present

## 2021-03-27 DIAGNOSIS — F419 Anxiety disorder, unspecified: Secondary | ICD-10-CM | POA: Diagnosis present

## 2021-03-27 DIAGNOSIS — I517 Cardiomegaly: Secondary | ICD-10-CM | POA: Diagnosis not present

## 2021-03-27 DIAGNOSIS — R6881 Early satiety: Secondary | ICD-10-CM | POA: Diagnosis present

## 2021-03-27 DIAGNOSIS — I480 Paroxysmal atrial fibrillation: Secondary | ICD-10-CM | POA: Diagnosis not present

## 2021-03-27 DIAGNOSIS — N281 Cyst of kidney, acquired: Secondary | ICD-10-CM | POA: Diagnosis not present

## 2021-03-27 DIAGNOSIS — I130 Hypertensive heart and chronic kidney disease with heart failure and stage 1 through stage 4 chronic kidney disease, or unspecified chronic kidney disease: Secondary | ICD-10-CM | POA: Diagnosis present

## 2021-03-27 LAB — BASIC METABOLIC PANEL
Anion gap: 11 (ref 5–15)
BUN: 75 mg/dL — ABNORMAL HIGH (ref 8–23)
CO2: 17 mmol/L — ABNORMAL LOW (ref 22–32)
Calcium: 10.8 mg/dL — ABNORMAL HIGH (ref 8.9–10.3)
Chloride: 107 mmol/L (ref 98–111)
Creatinine, Ser: 2.23 mg/dL — ABNORMAL HIGH (ref 0.44–1.00)
GFR, Estimated: 22 mL/min — ABNORMAL LOW (ref >60)
Glucose, Bld: 95 mg/dL (ref 70–99)
Potassium: 4.1 mmol/L (ref 3.5–5.1)
Sodium: 135 mmol/L (ref 135–145)

## 2021-03-27 LAB — ECHOCARDIOGRAM COMPLETE
AR max vel: 1.31 cm2
AV Area VTI: 1.36 cm2
AV Area mean vel: 1.32 cm2
AV Mean grad: 3 mmHg
AV Peak grad: 5.3 mmHg
Ao pk vel: 1.15 m/s
Area-P 1/2: 4.15 cm2
Height: 64 in
S' Lateral: 5.2 cm
Weight: 2042.34 oz

## 2021-03-27 LAB — RESP PANEL BY RT-PCR (FLU A&B, COVID) ARPGX2
Influenza A by PCR: NEGATIVE
Influenza B by PCR: NEGATIVE
SARS Coronavirus 2 by RT PCR: NEGATIVE

## 2021-03-27 LAB — BRAIN NATRIURETIC PEPTIDE: B Natriuretic Peptide: >4500 pg/mL — ABNORMAL HIGH (ref 0.0–100.0)

## 2021-03-27 MED ORDER — FUROSEMIDE 10 MG/ML IJ SOLN
60.0000 mg | Freq: Every day | INTRAMUSCULAR | Status: DC
  Administered 2021-03-27 – 2021-03-31 (×4): 60 mg via INTRAVENOUS
  Filled 2021-03-27 (×6): qty 6

## 2021-03-27 MED ORDER — ALPRAZOLAM 0.25 MG PO TABS
0.2500 mg | ORAL_TABLET | Freq: Every day | ORAL | Status: DC | PRN
  Administered 2021-04-07: 0.25 mg via ORAL
  Filled 2021-03-27: qty 1

## 2021-03-27 MED ORDER — TORSEMIDE 20 MG PO TABS: 20.0000 mg | ORAL_TABLET | Freq: Every day | ORAL | Status: DC

## 2021-03-27 MED ORDER — APIXABAN 2.5 MG PO TABS
2.5000 mg | ORAL_TABLET | Freq: Two times a day (BID) | ORAL | Status: AC
  Administered 2021-03-27 – 2021-04-02 (×15): 2.5 mg via ORAL
  Filled 2021-03-27 (×16): qty 1

## 2021-03-27 MED ORDER — ROSUVASTATIN CALCIUM 5 MG PO TABS
10.0000 mg | ORAL_TABLET | Freq: Every evening | ORAL | Status: DC
  Administered 2021-03-27 – 2021-04-08 (×12): 10 mg via ORAL
  Filled 2021-03-27 (×13): qty 2

## 2021-03-27 MED ORDER — PERFLUTREN LIPID MICROSPHERE
1.0000 mL | INTRAVENOUS | Status: AC | PRN
  Administered 2021-03-27: 5 mL via INTRAVENOUS
  Filled 2021-03-27: qty 10

## 2021-03-27 MED ORDER — ASCORBIC ACID 500 MG PO TABS
1000.0000 mg | ORAL_TABLET | Freq: Every day | ORAL | Status: DC
  Administered 2021-03-27 – 2021-04-08 (×12): 1000 mg via ORAL
  Filled 2021-03-27 (×12): qty 2

## 2021-03-27 MED ORDER — METOPROLOL SUCCINATE ER 50 MG PO TB24
50.0000 mg | ORAL_TABLET | Freq: Every day | ORAL | Status: DC
  Administered 2021-03-27 – 2021-03-28 (×2): 50 mg via ORAL
  Filled 2021-03-27: qty 1
  Filled 2021-03-27: qty 2

## 2021-03-27 MED ORDER — SPIRONOLACTONE 25 MG PO TABS: 25.0000 mg | ORAL_TABLET | ORAL | Status: DC

## 2021-03-27 NOTE — Progress Notes (Signed)
PROGRESS NOTE    [name]  [id number] DOB: Dec 26, 1940 DOA: 03/26/2021 PCP: Merrilee Seashore, MD   Brief Narrative:  Holly [name] is a 80 y.o. female with medical history significant for heart block s/p pacemaker, paroxysmal atrial fibrillation on Eliquis, combined systolic and diastolic CHF, hypertension, CKD 3a, and chronic iron deficiency anemia, hyperlipidemia, anxiety who presents at the request of her PCP for abnormal labs. She complains of increasing shortness of breath both at rest and with exertion for the past 2 months. For the past month she has also noticed upper abdominal pain every time that she eats.  Has had decreased p.o. intake because of this pain and because of lack of appetite. CT of the abdomen obtained shows contracted gallbladder with cholelithiasis but no cholecystitis.  There is also notable anasarca of the abdominal wall and edema of the mesenteric lymphatics.  Assessment & Plan:   Principal Problem:   AKI (acute kidney injury) ([place]) Active Problems:   Essential hypertension   S/P placement of cardiac pacemaker   Acute systolic heart failure (HCC)   Paroxysmal A-fib (HCC)   Cholelithiasis   Acute on chronic Combined systolic and diastolic CHF -Repeat echo pending -Continue aggressive diuretics, follow creatinine closely as below -Continue spironolactone, metoprolol -BNP undetectably high >4500 -Supportive care, I's and O's, daily weights early ambulation as tolerated to assist with fluid mobility  AKI on CKD stage IIIa -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 given above -Currently on IV diuretics, monitor closely  Cholelithiasis without cholecystitis - Postprandial fullness/pain over the past few weeks  - Hold off on further imaging, CT abdomen pelvis shows cholelithiasis without cholecystitis as well as diverticulosis without diverticulitis -Advance diet as tolerated, follow clinically, certainly if worsening symptoms general  surgery could be involved but given her lack of symptoms this morning with breakfast we will hold off on any current interventions as she would likely need to be over any acute stage of heart failure and AKI as above  Paroxysmal atrial fibrillation - Continue Eliquis -Rate currently well controlled  Left upper pole kidney cyst, incidental finding - Cysts suspected to be benign hemorrhagic or proteinaceous cyst -  Renal ultrasound was limited without clear window, hold off on any further imaging at this time  History of heart block s/p pacemaker - Stable  Hypertension  -hold Losartan due to AKI -continue metoprolol  Hyperlipidemia -continue statin  DVT prophylaxis: Eliquis Code Status: Full Family Communication: Daughter at bedside  Status is: Inpatient  Dispo: The patient is from: Home              Anticipated d/c is to: To be determined              Anticipated d/c date is: 72+ hours              Patient currently not medically stable for discharge  Consultants:   None  Procedures:   None indicated  Antimicrobials:  None indicated  Subjective: No acute issues or events overnight, patient feels somewhat improved in regards to her respiratory and volume status but not yet back to baseline.  Otherwise denies abdominal pain nausea vomiting diarrhea constipation headache fevers or chills.  Objective: Vitals:   03/27/21 0515 03/27/21 0530 03/27/21 0545 03/27/21 0630  BP: 116/78 131/78 123/71 114/72  Pulse:    61  Resp: (!) '24 14 15 20  '$ Temp:      TempSrc:      SpO2:    100%    Intake/Output  Summary (Last 24 hours) at 03/27/2021 [id number] Last data filed at 03/27/2021 0040 Gross per 24 hour  Intake 2181.15 ml  Output --  Net 2181.15 ml   There were no vitals filed for this visit.  Examination:  General:  Pleasantly resting in bed, No acute distress. Somnolent but easily arousable HEENT:  Normocephalic atraumatic.  Sclerae nonicteric, noninjected.   Extraocular movements intact bilaterally. Neck:  Without mass or deformity.  Trachea is midline. Lungs: Bibasilar rales without overt wheeze or rhonchi. Heart:  Regular rate and rhythm.  Without murmurs, rubs, or gallops. Abdomen:  Soft, nontender, nondistended.  Without guarding or rebound. Extremities: Without cyanosis, clubbing, 3+ pitting edema to the knee Vascular:  Dorsalis pedis and posterior tibial pulses palpable bilaterally. Skin:  Warm and dry, no erythema, no ulcerations.  Data Reviewed: I have personally reviewed following labs and imaging studies  CBC: Recent Labs  Lab 03/26/21 1523  WBC 4.3  NEUTROABS 2.5  HGB 11.1*  HCT 34.0*  MCV 85.0  PLT [id number]   Basic Metabolic Panel: Recent Labs  Lab 03/26/21 1523 03/27/21 0445  NA 134* 135  K 4.2 4.1  CL 105 107  CO2 17* 17*  GLUCOSE 89 95  BUN 75* 75*  CREATININE 2.14* 2.23*  CALCIUM 11.0* 10.8*   GFR: CrCl cannot be calculated (Unknown ideal weight.). Liver Function Tests: Recent Labs  Lab 03/26/21 1523  AST 28  ALT 26  ALKPHOS 46  BILITOT 1.8*  PROT 7.6  ALBUMIN 3.5   Recent Labs  Lab 03/26/21 1523  LIPASE 46   No results for input(s): AMMONIA in the last 168 hours. Coagulation Profile: No results for input(s): INR, PROTIME in the last 168 hours. Cardiac Enzymes: No results for input(s): CKTOTAL, CKMB, CKMBINDEX, TROPONINI in the last 168 hours. BNP (last 3 results) Recent Labs    07/20/20 1201  PROBNP 9,907*   HbA1C: No results for input(s): HGBA1C in the last 72 hours. CBG: No results for input(s): GLUCAP in the last 168 hours. Lipid Profile: No results for input(s): CHOL, HDL, LDLCALC, TRIG, CHOLHDL, LDLDIRECT in the last 72 hours. Thyroid Function Tests: No results for input(s): TSH, T4TOTAL, FREET4, T3FREE, THYROIDAB in the last 72 hours. Anemia Panel: No results for input(s): VITAMINB12, FOLATE, FERRITIN, TIBC, IRON, RETICCTPCT in the last 72 hours. Sepsis Labs: No results for  input(s): PROCALCITON, LATICACIDVEN in the last 168 hours.  Recent Results (from the past 240 hour(s))  Resp Panel by RT-PCR (Flu A&B, Covid) Nasopharyngeal Swab     Status: None   Collection Time: 03/26/21 11:43 PM   Specimen: Nasopharyngeal Swab; Nasopharyngeal(NP) swabs in vial transport medium  Result Value Ref Range Status   SARS Coronavirus 2 by RT PCR NEGATIVE NEGATIVE Final    Comment: (NOTE) SARS-CoV-2 target nucleic acids are NOT DETECTED.  The SARS-CoV-2 RNA is generally detectable in upper respiratory specimens during the acute phase of infection. The lowest concentration of SARS-CoV-2 viral copies this assay can detect is 138 copies/mL. A negative result does not preclude SARS-Cov-2 infection and should not be used as the sole basis for treatment or other patient management decisions. A negative result may occur with  improper specimen collection/handling, submission of specimen other than nasopharyngeal swab, presence of viral mutation(s) within the areas targeted by this assay, and inadequate number of viral copies(<138 copies/mL). A negative result must be combined with clinical observations, patient history, and epidemiological information. The expected result is Negative.  Fact Sheet for Patients:  EntrepreneurPulse.com.au  Fact  Sheet for Healthcare Providers:  IncredibleEmployment.be  This test is no t yet approved or cleared by the [place] FDA and  has been authorized for detection and/or diagnosis of SARS-CoV-2 by FDA under an Emergency Use Authorization (EUA). This EUA will remain  in effect (meaning this test can be used) for the duration of the COVID-19 declaration under Section 564(b)(1) of the Act, 21 U.S.C.section 360bbb-3(b)(1), unless the authorization is terminated  or revoked sooner.       Influenza A by PCR NEGATIVE NEGATIVE Final   Influenza B by PCR NEGATIVE NEGATIVE Final    Comment: (NOTE) The  Xpert Xpress SARS-CoV-2/FLU/RSV plus assay is intended as an aid in the diagnosis of influenza from Nasopharyngeal swab specimens and should not be used as a sole basis for treatment. Nasal washings and aspirates are unacceptable for Xpert Xpress SARS-CoV-2/FLU/RSV testing.  Fact Sheet for Patients: EntrepreneurPulse.com.au  Fact Sheet for Healthcare Providers: IncredibleEmployment.be  This test is not yet approved or cleared by the [place] FDA and has been authorized for detection and/or diagnosis of SARS-CoV-2 by FDA under an Emergency Use Authorization (EUA). This EUA will remain in effect (meaning this test can be used) for the duration of the COVID-19 declaration under Section 564(b)(1) of the Act, 21 U.S.C. section 360bbb-3(b)(1), unless the authorization is terminated or revoked.  Performed at [facility], [place] [address], [place], [place] [address]          Radiology Studies: CT ABDOMEN PELVIS WO CONTRAST  Result Date: 03/26/2021 CLINICAL DATA:  Presenting for abnormal labs,, mechanical fall as well. EXAM: CT ABDOMEN AND PELVIS WITHOUT CONTRAST TECHNIQUE: Multidetector CT imaging of the abdomen and pelvis was performed following the standard protocol without IV contrast. COMPARISON:  CT 07/09/2017 FINDINGS: Lower chest: Chronic subpleural reticular changes noted in the lung bases. Additional atelectasis. Lung bases are otherwise clear. Cardiomegaly. Terminus of a pacer lead seen at the cardiac apex. No pericardial effusion. Hepatobiliary: Cyst again seen along the inferomedial liver measuring 1.9 cm in size (3/20), not significantly changed from prior. No concerning focal liver lesion. Punctate calcification in the left lobe liver, nonspecific. Normal hepatic attenuation. Smooth liver surface contour. Gallbladder appears contracted around a calcified gallstone measuring up to 2.2 cm in size. No pericholecystic fluid or  inflammation. No significant biliary ductal dilatation accounting for expected senescent change. No intraductal gallstones. Pancreas: No pancreatic ductal dilatation or surrounding inflammatory changes. Spleen: Normal in size. No concerning splenic lesions. Adrenals/Urinary Tract: Normal adrenal glands. Redemonstration of a cyst arising the upper pole right kidney measuring 3.8 cm in size, decreased from comparison prior. Intermediate attenuation 11 mm cyst seen in the posterior upper pole left kidney (3/16) could reflect a hemorrhagic or proteinaceous cyst though incompletely characterized on this exam. No other visible or contour deforming renal lesions. No urolithiasis or hydronephrosis. Urinary bladder is unremarkable for degree of distension. Stomach/Bowel: Distal esophagus, stomach and duodenal sweep are unremarkable. No small bowel wall thickening or dilatation. No evidence of obstruction. Appendix is not visualized. No focal inflammation the vicinity of the cecum to suggest an occult appendicitis. Scattered colonic diverticula without focal inflammation to suggest diverticulitis. No colonic dilatation or wall thickening. Vascular/Lymphatic: Atherosclerotic calcifications within the abdominal aorta and branch vessels. No aneurysm or ectasia. Numerous though nonenlarged retroperitoneal nodes are seen. Possibly reactive or edematous. No pathologically enlarged abdominopelvic lymph nodes within the limitations of this unenhanced CT. Reproductive: Anteverted uterus. Benign myometrial vascular calcifications. No suspicious adnexal lesions. Other: Extensive circumferential  body wall edema. Mild central mesenteric edematous changes. Musculoskeletal: The osseous structures appear diffusely demineralized which may limit detection of small or nondisplaced fractures. No acute fracture or vertebral body height loss. Dextrocurvature of the thoracolumbar spine, apex L3. Six lumbar vertebrae. Lowest disc space denoted as  L6-S1. Multilevel degenerative changes are present in the imaged portions of the spine. Severe sclerotic changes and bony remodeling of the bilateral femoral heads likely reflecting sequela of prior avascular necrosis advanced arthrosis with bilateral acetabular protrusio, right greater than left. Some slight joint space expansion and synovial thickening noted about the right hip with few joint bodies. Additional joint bodies are noted in the left hip articulation but without the same extent of synovia prominence. No acute fracture or traumatic osseous injury of hips, pelvis. IMPRESSION: 1. No clear acute traumatic findings in the abdomen or pelvis. Unenhanced CT was performed per clinician order. Lack of IV contrast limits sensitivity and specificity, especially for evaluation of abdominal/pelvic solid viscera. 2. Features of developing anasarca with body wall edema, mesenteric edema and likely edematous central mesenteric lymph nodes. Cardiomegaly noted as well. Correlate with patient hydration status or clinical features of heart failure/volume overload. 3. Indeterminate 11 mm hyperdense cystic focus in the upper pole left kidney. Suspect benign hemorrhagic or proteinaceous cyst though incompletely characterized. Consider renal ultrasound or MRI for further characterization 4. No other acute or worrisome urinary tract abnormality. 5. Cholelithiasis without evidence of acute cholecystitis. 6. Diverticulosis without evidence of acute diverticulitis. 7. Aortic Atherosclerosis (ICD10-I70.0). 8. Progressive and severe degenerative changes in the bilateral hips including features of prior avascular necrosis. Right greater than left synovial thickening and effusion, sterility is not completely ascertained on imaging. If there is clinical concern, fluid sampling should be considered. Electronically Signed   By: Lovena Le M.D.   On: 03/26/2021 22:05   US RENAL  Result Date: 03/27/2021 CLINICAL DATA:  Indeterminate  left renal cyst EXAM: RENAL / URINARY TRACT ULTRASOUND COMPLETE COMPARISON:  CT 03/26/2021 FINDINGS: Right Kidney: Renal measurements: 8.6 x 3.9 x 4.5 cm = volume: 79 mL. Echogenicity within normal limits. No mass or hydronephrosis visualized. 4.2 cm simple cortical cyst is seen within the anterior interpolar region. Left Kidney: Renal measurements: At least 8.0 x 4.7 x 4.5 cm = volume: 88 mL. Evaluation of the left kidney is significantly limited by depth of the structure as well as overlying bowel gas. In particular, the upper pole is obscured and the renal cyst in question is not visualized. No hydronephrosis. No intrarenal masses or calcifications within the visualized segment. Preserved cortical thickness and normal cortical echogenicity. Bladder: Appears normal for degree of bladder distention. Other: None. IMPRESSION: Technically limited examination, due to overlying bowel gas, with nonvisualization of the questioned cystic lesion within the upper pole of the left kidney. Dedicated renal mass protocol CT or MRI examination with contrast is recommended for definitive characterization of the question lesion, if indicated. Electronically Signed   By: Fidela Salisbury MD   On: 03/27/2021 01:18   DG Hip Unilat W or Wo Pelvis 2-3 Views Right  Result Date: 03/26/2021 CLINICAL DATA:  80 year old female with fall and right hip pain. EXAM: DG HIP (WITH OR WITHOUT PELVIS) 2-3V RIGHT COMPARISON:  Pelvic radiograph dated 10/07/2018 FINDINGS: There is no acute fracture or dislocation. The bones are osteopenic. Severe bilateral hip arthritic changes with flattening of the left femoral head and right protrusio acetabuli. Degenerative changes of the visualized lower lumbar spine. The soft tissues are unremarkable. IMPRESSION: 1.  No acute fracture or dislocation. 2. 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 Electronically Signed   By: Anner Crete M.D.   On: 03/26/2021 16:42    Scheduled Meds: . apixaban  2.5 mg Oral BID  . vitamin C  1,000 mg Oral Daily  . furosemide  60 mg Intravenous Daily  . metoprolol succinate  50 mg Oral Daily  . rosuvastatin  10 mg Oral QPM  . [START ON 03/28/2021] spironolactone  25 mg Oral QODAY   Continuous Infusions:   LOS: 0 days   Time spent: 63mn  Rayland Hamed C Demitris Pokorny, DO [facility]  If 7PM-7AM, please contact night-coverage [email or web address]  03/27/2021, 8:22 AM

## 2021-03-28 ENCOUNTER — Ambulatory Visit: Payer: Medicare Other | Admitting: Cardiology

## 2021-03-28 DIAGNOSIS — N179 Acute kidney failure, unspecified: Secondary | ICD-10-CM | POA: Diagnosis not present

## 2021-03-28 LAB — COMPREHENSIVE METABOLIC PANEL
ALT: 34 U/L (ref 0–44)
AST: 38 U/L (ref 15–41)
Albumin: 3.1 g/dL — ABNORMAL LOW (ref 3.5–5.0)
Alkaline Phosphatase: 45 U/L (ref 38–126)
Anion gap: 12 (ref 5–15)
BUN: 73 mg/dL — ABNORMAL HIGH (ref 8–23)
CO2: 19 mmol/L — ABNORMAL LOW (ref 22–32)
Calcium: 10.7 mg/dL — ABNORMAL HIGH (ref 8.9–10.3)
Chloride: 104 mmol/L (ref 98–111)
Creatinine, Ser: 2.26 mg/dL — ABNORMAL HIGH (ref 0.44–1.00)
GFR, Estimated: 21 mL/min — ABNORMAL LOW (ref >60)
Glucose, Bld: 106 mg/dL — ABNORMAL HIGH (ref 70–99)
Potassium: 3.6 mmol/L (ref 3.5–5.1)
Sodium: 135 mmol/L (ref 135–145)
Total Bilirubin: 1.7 mg/dL — ABNORMAL HIGH (ref 0.3–1.2)
Total Protein: 7 g/dL (ref 6.5–8.1)

## 2021-03-28 NOTE — Hospital Course (Signed)
IMPRESSIONS     1. Left ventricular ejection fraction, by estimation, is <20%. The left  ventricle has severely decreased function. The left ventricle demonstrates  global hypokinesis. There is mild left ventricular hypertrophy. Left  ventricular diastolic parameters are  indeterminate.   2. Right ventricular systolic function is normal. The right ventricular  size is normal.   3. Left atrial size was severely dilated.   4. Right atrial size was severely dilated.   5. The mitral valve is normal in structure. Severe mitral valve  regurgitation. No evidence of mitral stenosis.   6. Tricuspid valve regurgitation is severe.   7. The aortic valve is normal in structure. There is mild calcification  of the aortic valve. There is mild thickening of the aortic valve. Aortic  valve regurgitation is trivial. Mild to moderate aortic valve  sclerosis/calcification is present, without  any evidence of aortic stenosis.   8. The inferior vena cava is dilated in size with <50% respiratory  variability, suggesting right atrial pressure of 15 mmHg.

## 2021-03-28 NOTE — Progress Notes (Signed)
Heart Failure Navigator Progress Note  Assessed for Heart & Vascular TOC clinic readiness.  Unfortunately at this time the patient does not meet criteria due to worsening AKI. Will continue to monitor patient throughout admission to evaluate for [facility].   Navigator available for reassessment of patient.   Kerby Nora, PharmD, BCPS Heart Failure Stewardship Pharmacist Phone [phone]

## 2021-03-28 NOTE — Progress Notes (Signed)
PROGRESS NOTE    DAILY PROVINS  [id number] DOB: 1941-03-06 DOA: 03/26/2021 PCP: Merrilee Seashore, MD   Brief Narrative:  80 y.o. female with medical history significant for heart block s/p pacemaker, paroxysmal atrial fibrillation on Eliquis, combined systolic and diastolic CHF, hypertension, CKD 3a, and chronic iron deficiency anemia, hyperlipidemia, anxiety who presents at the request of her PCP for abnormal labs. She complains of increasing shortness of breath both at rest and with exertion for the past 2 months.  For the past month she has also noticed upper abdominal pain every time that she eats.  Has had decreased p.o. intake because of this pain and because of lack of appetite. CT of the abdomen obtained shows contracted gallbladder with cholelithiasis but no cholecystitis.   There is also notable anasarca of the abdominal wall and edema of the mesenteric lymphatics.  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 -Repeat echo shows a depressed EF of 20% -Appreciate cardiology assistance, continuing IV Lasix 60 -No plan for ischemic evaluation-patient is rather frail and it is late in her disease course per cardiologist for invasive work-up -Beta-blocker continued from admission, Aldactone on hold -Agree with and appreciate cardiology initiative-palliative care to see and discuss goals of care with family and patient  AKI on CKD stage  IIIa -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 given above  Cholelithiasis without cholecystitis - Postprandial fullness/pain over the past few weeks  - Hold off on further imaging, CT abdomen pelvis shows cholelithiasis without cholecystitis as well as diverticulosis without diverticulitis -Continue heart healthy diet  Paroxysmal atrial fibrillation - Continue Eliquis 2.5 twice daily -Rate currently well controlled on Toprol-XL 50  Left upper pole kidney cyst, incidental finding - Cysts suspected to be benign hemorrhagic or proteinaceous cyst -  Renal ultrasound was limited without clear window, hold off on any further imaging at this time  History of heart block s/p pacemaker - Stable  Hypertension  -hold Losartan due to AKI -continue metoprolol  Hyperlipidemia -continue statin  DVT prophylaxis: Eliquis Code Status: Full Family Communication: Daughter at bedside  Status is: Inpatient  Dispo: The patient is from: Home              Anticipated d/c is to: To be determined              Anticipated d/c date is: 72+ hours              Patient currently not medically stable for discharge  Consultants:   None  Procedures:   None indicated  Antimicrobials:  None indicated  Subjective: Awake coherent Looks rather frail No chest pain Breathing comfortably off of any oxygen Daughters arrived at the end of my interaction with her.  Objective: Vitals:   03/28/21 0303 03/28/21 0843 03/28/21 0908 03/28/21 1202  BP: 107/69 99/72 109/69 109/73  Pulse: 61 60 64 60  Resp: '16 20 18 20  '$ Temp:  97.9 F (36.6 C) 99 F (37.2 C)  (!) 97.5 F (36.4 C)  TempSrc: Oral Oral  Oral  SpO2: 97% 100% 99% 99%  Weight:      Height:        Intake/Output Summary (Last 24 hours) at 03/28/2021 1718 Last data filed at 03/28/2021 0100 Gross per 24 hour  Intake --  Output 1050 ml  Net -1050 ml   Filed Weights   03/27/21 1236 03/28/21 0120  Weight: 57.9 kg 53.5 kg     Examination:  Pleasant coherent edentulous frail appearing black female cannot appreciate JVD S1-S2 no murmur Chest clear in the lateral lung fields Abdomen soft No lower extremity edema No rash Neurologically power is grossly 5/5 sensory deferred   Data Reviewed: I have personally reviewed following labs and imaging studies  CBC: Recent Labs  Lab 03/26/21 1523  WBC 4.3  NEUTROABS 2.5  HGB 11.1*  HCT 34.0*  MCV 85.0  PLT [id number]   Basic Metabolic Panel: Recent Labs  Lab 03/26/21 1523 03/27/21 0445 03/28/21 0857  NA 134* 135 135  K 4.2 4.1 3.6  CL 105 107 104  CO2 17* 17* 19*  GLUCOSE 89 95 106*  BUN 75* 75* 73*  CREATININE 2.14* 2.23* 2.26*  CALCIUM 11.0* 10.8* 10.7*   GFR: Estimated Creatinine Clearance: 16.8 mL/min (A) (by C-G formula based on SCr of 2.26 mg/dL (H)). Liver Function Tests: Recent Labs  Lab 03/26/21 1523 03/28/21 0857  AST 28 38  ALT 26 34  ALKPHOS 46 45  BILITOT 1.8* 1.7*  PROT 7.6 7.0  ALBUMIN 3.5 3.1*   Recent Labs  Lab 03/26/21 1523  LIPASE 46   No results for input(s): AMMONIA in the last 168 hours. Coagulation Profile: No results for input(s): INR, PROTIME in the last 168 hours. Cardiac Enzymes: No results for input(s): CKTOTAL, CKMB, CKMBINDEX, TROPONINI in the last 168 hours. BNP (last 3 results) Recent Labs    07/20/20 1201  PROBNP 9,907*   HbA1C: No results for input(s): HGBA1C in the last 72 hours. CBG: No results for input(s): GLUCAP in the last 168 hours. Lipid Profile: No results for input(s): CHOL, HDL, LDLCALC, TRIG, CHOLHDL, LDLDIRECT in the last 72 hours. Thyroid Function Tests: No results for input(s): TSH, T4TOTAL, FREET4, T3FREE, THYROIDAB in the last 72 hours. Anemia Panel: No results for input(s): VITAMINB12, FOLATE, FERRITIN, TIBC, IRON, RETICCTPCT in the last 72 hours. Sepsis Labs: No results for input(s): PROCALCITON, LATICACIDVEN in the last 168 hours.  Recent Results (from the past 240  hour(s))  Resp Panel by RT-PCR (Flu A&B, Covid) Nasopharyngeal Swab     Status: None   Collection Time: 03/26/21 11:43 PM   Specimen: Nasopharyngeal Swab; Nasopharyngeal(NP) swabs in vial transport medium  Result Value Ref Range Status   SARS Coronavirus 2 by RT PCR NEGATIVE NEGATIVE Final    Comment: (NOTE) SARS-CoV-2 target nucleic acids are NOT DETECTED.  The SARS-CoV-2 RNA is generally detectable in upper respiratory specimens during the acute phase of infection. The lowest concentration of SARS-CoV-2 viral copies this assay can detect is 138 copies/mL. A negative result does not preclude SARS-Cov-2 infection and should not be used as the sole basis for treatment or other patient management decisions. A negative result may occur with  improper specimen collection/handling, submission of specimen other than nasopharyngeal swab, presence of viral mutation(s) within the areas targeted by this assay, and inadequate number of viral copies(<138 copies/mL). A negative result must be combined with clinical observations, patient  history, and epidemiological information. The expected result is Negative.  Fact Sheet for Patients:  EntrepreneurPulse.com.au  Fact Sheet for Healthcare Providers:  IncredibleEmployment.be  This test is no t yet approved or cleared by the [place] FDA and  has been authorized for detection and/or diagnosis of SARS-CoV-2 by FDA under an Emergency Use Authorization (EUA). This EUA will remain  in effect (meaning this test can be used) for the duration of the COVID-19 declaration under Section 564(b)(1) of the Act, 21 U.S.C.section 360bbb-3(b)(1), unless the authorization is terminated  or revoked sooner.       Influenza A by PCR NEGATIVE NEGATIVE Final   Influenza B by PCR NEGATIVE NEGATIVE Final    Comment: (NOTE) The Xpert Xpress SARS-CoV-2/FLU/RSV plus assay is intended as an aid in the diagnosis of influenza from  Nasopharyngeal swab specimens and should not be used as a sole basis for treatment. Nasal washings and aspirates are unacceptable for Xpert Xpress SARS-CoV-2/FLU/RSV testing.  Fact Sheet for Patients: EntrepreneurPulse.com.au  Fact Sheet for Healthcare Providers: IncredibleEmployment.be  This test is not yet approved or cleared by the [place] FDA and has been authorized for detection and/or diagnosis of SARS-CoV-2 by FDA under an Emergency Use Authorization (EUA). This EUA will remain in effect (meaning this test can be used) for the duration of the COVID-19 declaration under Section 564(b)(1) of the Act, 21 U.S.C. section 360bbb-3(b)(1), unless the authorization is terminated or revoked.  Performed at [facility], [place] [address], [place], [place] [address]          Radiology Studies: CT ABDOMEN PELVIS WO CONTRAST  Result Date: 03/26/2021 CLINICAL DATA:  Presenting for abnormal labs,, mechanical fall as well. EXAM: CT ABDOMEN AND PELVIS WITHOUT CONTRAST TECHNIQUE: Multidetector CT imaging of the abdomen and pelvis was performed following the standard protocol without IV contrast. COMPARISON:  CT 07/09/2017 FINDINGS: Lower chest: Chronic subpleural reticular changes noted in the lung bases. Additional atelectasis. Lung bases are otherwise clear. Cardiomegaly. Terminus of a pacer lead seen at the cardiac apex. No pericardial effusion. Hepatobiliary: Cyst again seen along the inferomedial liver measuring 1.9 cm in size (3/20), not significantly changed from prior. No concerning focal liver lesion. Punctate calcification in the left lobe liver, nonspecific. Normal hepatic attenuation. Smooth liver surface contour. Gallbladder appears contracted around a calcified gallstone measuring up to 2.2 cm in size. No pericholecystic fluid or inflammation. No significant biliary ductal dilatation accounting for expected senescent change. No intraductal  gallstones. Pancreas: No pancreatic ductal dilatation or surrounding inflammatory changes. Spleen: Normal in size. No concerning splenic lesions. Adrenals/Urinary Tract: Normal adrenal glands. Redemonstration of a cyst arising the upper pole right kidney measuring 3.8 cm in size, decreased from comparison prior. Intermediate attenuation 11 mm cyst seen in the posterior upper pole left kidney (3/16) could reflect a hemorrhagic or proteinaceous cyst though incompletely characterized on this exam. No other visible or contour deforming renal lesions. No urolithiasis or hydronephrosis. Urinary bladder is unremarkable for degree of distension. Stomach/Bowel: Distal esophagus, stomach and duodenal sweep are unremarkable. No small bowel wall thickening or dilatation. No evidence of obstruction. Appendix is not visualized. No focal inflammation the vicinity of the cecum to suggest an occult appendicitis. Scattered colonic diverticula without focal inflammation to suggest diverticulitis. No colonic dilatation or wall thickening. Vascular/Lymphatic: Atherosclerotic calcifications within the abdominal aorta and branch vessels. No aneurysm or ectasia. Numerous though nonenlarged retroperitoneal nodes are seen. Possibly reactive or edematous. No pathologically enlarged abdominopelvic lymph nodes within the limitations  of this unenhanced CT. Reproductive: Anteverted uterus. Benign myometrial vascular calcifications. No suspicious adnexal lesions. Other: Extensive circumferential body wall edema. Mild central mesenteric edematous changes. Musculoskeletal: The osseous structures appear diffusely demineralized which may limit detection of small or nondisplaced fractures. No acute fracture or vertebral body height loss. Dextrocurvature of the thoracolumbar spine, apex L3. Six lumbar vertebrae. Lowest disc space denoted as L6-S1. Multilevel degenerative changes are present in the imaged portions of the spine. Severe sclerotic changes  and bony remodeling of the bilateral femoral heads likely reflecting sequela of prior avascular necrosis advanced arthrosis with bilateral acetabular protrusio, right greater than left. Some slight joint space expansion and synovial thickening noted about the right hip with few joint bodies. Additional joint bodies are noted in the left hip articulation but without the same extent of synovia prominence. No acute fracture or traumatic osseous injury of hips, pelvis. IMPRESSION: 1. No clear acute traumatic findings in the abdomen or pelvis. Unenhanced CT was performed per clinician order. Lack of IV contrast limits sensitivity and specificity, especially for evaluation of abdominal/pelvic solid viscera. 2. Features of developing anasarca with body wall edema, mesenteric edema and likely edematous central mesenteric lymph nodes. Cardiomegaly noted as well. Correlate with patient hydration status or clinical features of heart failure/volume overload. 3. Indeterminate 11 mm hyperdense cystic focus in the upper pole left kidney. Suspect benign hemorrhagic or proteinaceous cyst though incompletely characterized. Consider renal ultrasound or MRI for further characterization 4. No other acute or worrisome urinary tract abnormality. 5. Cholelithiasis without evidence of acute cholecystitis. 6. Diverticulosis without evidence of acute diverticulitis. 7. Aortic Atherosclerosis (ICD10-I70.0). 8. Progressive and severe degenerative changes in the bilateral hips including features of prior avascular necrosis. Right greater than left synovial thickening and effusion, sterility is not completely ascertained on imaging. If there is clinical concern, fluid sampling should be considered. Electronically Signed   By: Lovena Le M.D.   On: 03/26/2021 22:05   US RENAL  Result Date: 03/27/2021 CLINICAL DATA:  Indeterminate left renal cyst EXAM: RENAL / URINARY TRACT ULTRASOUND COMPLETE COMPARISON:  CT 03/26/2021 FINDINGS: Right  Kidney: Renal measurements: 8.6 x 3.9 x 4.5 cm = volume: 79 mL. Echogenicity within normal limits. No mass or hydronephrosis visualized. 4.2 cm simple cortical cyst is seen within the anterior interpolar region. Left Kidney: Renal measurements: At least 8.0 x 4.7 x 4.5 cm = volume: 88 mL. Evaluation of the left kidney is significantly limited by depth of the structure as well as overlying bowel gas. In particular, the upper pole is obscured and the renal cyst in question is not visualized. No hydronephrosis. No intrarenal masses or calcifications within the visualized segment. Preserved cortical thickness and normal cortical echogenicity. Bladder: Appears normal for degree of bladder distention. Other: None. IMPRESSION: Technically limited examination, due to overlying bowel gas, with nonvisualization of the questioned cystic lesion within the upper pole of the left kidney. Dedicated renal mass protocol CT or MRI examination with contrast is recommended for definitive characterization of the question lesion, if indicated. Electronically Signed   By: Fidela Salisbury MD   On: 03/27/2021 01:18   ECHOCARDIOGRAM COMPLETE  Result Date: 03/27/2021    ECHOCARDIOGRAM REPORT   Patient Name:   Holly [name] Date of Exam: 03/27/2021 Medical Rec #:  [id number]      Height:       59.0 in Accession #:    [id number]     Weight:       128.0 lb Date of Birth:  June 15, 1941      BSA:          1.526 m Patient Age:    79 years       BP:           114/72 mmHg Patient Gender: F              HR:           61 bpm. Exam Location:  Inpatient Procedure: 2D Echo, Cardiac Doppler and Color Doppler Indications:    CHF  History:        Patient has no prior history of Echocardiogram examinations,                 most recent 08/22/2020. Pacemaker; Risk Factors:Hypertension.  Sonographer:    Cammy Brochure Referring Phys: [id number] [place] T TU IMPRESSIONS  1. Left ventricular ejection fraction, by estimation, is <20%. The left ventricle has severely  decreased function. The left ventricle demonstrates global hypokinesis. There is mild left ventricular hypertrophy. Left ventricular diastolic parameters are indeterminate.  2. Right ventricular systolic function is normal. The right ventricular size is normal.  3. Left atrial size was severely dilated.  4. Right atrial size was severely dilated.  5. The mitral valve is normal in structure. Severe mitral valve regurgitation. No evidence of mitral stenosis.  6. Tricuspid valve regurgitation is severe.  7. The aortic valve is normal in structure. There is mild calcification of the aortic valve. There is mild thickening of the aortic valve. Aortic valve regurgitation is trivial. Mild to moderate aortic valve sclerosis/calcification is present, without any evidence of aortic stenosis.  8. The inferior vena cava is dilated in size with <50% respiratory variability, suggesting right atrial pressure of 15 mmHg. FINDINGS  Left Ventricle: Left ventricular ejection fraction, by estimation, is <20%. The left ventricle has severely decreased function. The left ventricle demonstrates global hypokinesis. The left ventricular internal cavity size was normal in size. There is mild left ventricular hypertrophy. Left ventricular diastolic parameters are indeterminate. Right Ventricle: The right ventricular size is normal. No increase in right ventricular wall thickness. Right ventricular systolic function is normal. Left Atrium: Left atrial size was severely dilated. Right Atrium: Right atrial size was severely dilated. Pericardium: There is no evidence of pericardial effusion. Mitral Valve: The mitral valve is normal in structure. Severe mitral valve regurgitation. No evidence of mitral valve stenosis. Tricuspid Valve: The tricuspid valve is normal in structure. Tricuspid valve regurgitation is severe. No evidence of tricuspid stenosis. Aortic Valve: The aortic valve is normal in structure. There is mild calcification of the aortic  valve. There is mild thickening of the aortic valve. Aortic valve regurgitation is trivial. Mild to moderate aortic valve sclerosis/calcification is present,  without any evidence of aortic stenosis. Aortic valve mean gradient measures 3.0 mmHg. Aortic valve peak gradient measures 5.3 mmHg. Aortic valve area, by VTI measures 1.36 cm. Pulmonic Valve: The pulmonic valve was normal in structure. Pulmonic valve regurgitation is mild. No evidence of pulmonic stenosis. Aorta: The aortic root is normal in size and structure. Venous: The inferior vena cava is dilated in size with less than 50% respiratory variability, suggesting right atrial pressure of 15 mmHg. IAS/Shunts: No atrial level shunt detected by color flow Doppler. Additional Comments: A device lead is visualized.  LEFT VENTRICLE PLAX 2D LVIDd:         5.80 cm LVIDs:         5.20 cm LV PW:  1.50 cm LV IVS:        1.00 cm LVOT diam:     2.20 cm LV SV:         28 LV SV Index:   18 LVOT Area:     3.80 cm  RIGHT VENTRICLE            IVC RV Basal diam:  4.60 cm    IVC diam: 2.70 cm RV S prime:     8.93 cm/s TAPSE (M-mode): 1.7 cm LEFT ATRIUM             Index       RIGHT ATRIUM           Index LA diam:        5.10 cm 3.34 cm/m  RA Area:     19.30 cm LA Vol (A2C):   96.3 ml 63.12 ml/m RA Volume:   55.60 ml  36.44 ml/m LA Vol (A4C):   89.3 ml 58.53 ml/m LA Biplane Vol: 93.0 ml 60.96 ml/m  AORTIC VALVE AV Area (Vmax):    1.31 cm AV Area (Vmean):   1.32 cm AV Area (VTI):     1.36 cm AV Vmax:           115.00 cm/s AV Vmean:          78.100 cm/s AV VTI:            0.205 m AV Peak Grad:      5.3 mmHg AV Mean Grad:      3.0 mmHg LVOT Vmax:         39.60 cm/s LVOT Vmean:        27.200 cm/s LVOT VTI:          0.074 m LVOT/AV VTI ratio: 0.36  AORTA Ao Root diam: 2.70 cm Ao Asc diam:  2.60 cm MITRAL VALVE                TRICUSPID VALVE MV Area (PHT): 4.15 cm     TR Peak grad:   45.4 mmHg MV Decel Time: 183 msec     TR Vmax:        337.00 cm/s MV E velocity:  106.00 cm/s                             SHUNTS                             Systemic VTI:  0.07 m                             Systemic Diam: 2.20 cm Candee Furbish MD Electronically signed by Candee Furbish MD Signature Date/Time: 03/27/2021/2:32:30 PM    Final    Scheduled Meds: . apixaban  2.5 mg Oral BID  . vitamin C  1,000 mg Oral Daily  . furosemide  60 mg Intravenous Daily  . rosuvastatin  10 mg Oral QPM   Continuous Infusions:   LOS: 1 day   Time spent: 6mn  Jai-Gurmukh Nabeel Gladson, DO [facility]  If 7PM-7AM, please contact night-coverage [email or web address]  03/28/2021, 5:18 PM

## 2021-03-28 NOTE — Consult Note (Signed)
CARDIOLOGY CONSULT NOTE  Patient ID: Holly [name] MRN: [id number] DOB/AGE: 1941-07-27 80 y.o.  Admit date: 03/26/2021 Referring Physician: [facility] hospitalist Primary Physician:  [name] Reason for Consultation:  Heart failure  HPI:   80 year old African American female with hypertension, dual-chamber pacemaker placement for AV block, moderate, medically managed coronary artery disease (LCx), hypertension, NSVT, HFrEF  Patient had recently ha worsening shortness of breath. She was to see me in office last week, but was not able to keep the appt. She was sent from PCP's office for acute on chornic heart failure.  Patient was seen by her PCP [name] on Friday, and was asked to go to emergency room after labs returned on Monday.  I do not have access to all the labs.  However, her BUN was noted to be elevated at 25, with creatinine at 2.14.  Patient's daughter Holly [name] is in the room with her today.  Patient has had significant decline in her health over the past 2 weeks.  She has lost several pounds, has been lethargic, not eating well.  She reportedly also had an episode of nausea, vomiting, and fainting to be considered at discharge.  Work-up so far has showed BNP > 4500, creatinine elevated 2.3.  CT abdomen did not show any acute traumatic findings.  It did show generalized anasarca, mesenteric edema, and likely edematous central mesenteric lymph nodes.  She has diuresed at least 1.5 L urine with 60 mg lasix. Daughter Holly [name] states that this is the most awake she has seen her in a while.   Past Medical History:  Diagnosis Date  . 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 Bronchitis   . Encounter for care of pacemaker 05/12/2019  . Hypertension   . Pacemaker - MRI safe Medtronic Azure XT DR MRI [id number] Dual chamber PPM in situ 04/24/2019     Past Surgical History:  Procedure Laterality Date  . LEFT HEART CATH AND CORONARY ANGIOGRAPHY N/A 11/11/2017   Procedure: LEFT HEART  CATH AND CORONARY ANGIOGRAPHY;  Surgeon: Nigel Mormon, MD;  Location: [facility];  Service: Cardiovascular;  Laterality: N/A;  . left leg surgery    . PACEMAKER IMPLANT N/A 07/15/2017   Procedure: Pacemaker Implant;  Surgeon: Constance Haw, MD;  Location: [facility];  Service: Cardiovascular;  Laterality: N/A;      Family History  Problem Relation Age of Onset  . Hypertension Mother   . ALS Sister   . Heart attack Brother   . Diabetes Daughter      Social History: Social History   Socioeconomic History  . Marital status: Divorced    Spouse name: Not on file  . Number of children: 7  . Years of education: Not on file  . Highest education level: Not on file  Occupational History  . Not on file  Tobacco Use  . Smoking status: Never Smoker  . Smokeless tobacco: Never Used  Vaping Use  . Vaping Use: Never used  Substance and Sexual Activity  . Alcohol use: No  . Drug use: Never  . Sexual activity: Not on file  Other Topics Concern  . Not on file  Social History Narrative  . Not on file   Social Determinants of Health   Financial Resource Strain: Not on file  Food Insecurity: Not on file  Transportation Needs: Not on file  Physical Activity: Not on file  Stress: Not on file  Social Connections: Not on file  Intimate  Partner Violence: Not on file     Medications Prior to Admission  Medication Sig Dispense Refill Last Dose  . ALPRAZolam (XANAX) 0.25 MG tablet Take 1 tablet by mouth daily as needed for anxiety.    Past Week at Unknown time  . apixaban (ELIQUIS) 2.5 MG TABS tablet Take 1 tablet by mouth in the morning and at bedtime.   03/26/2021 at 0900  . Ascorbic Acid (VITAMIN C) 1000 MG tablet Take 1,000 mg by mouth daily.   03/26/2021 at Unknown time  . clobetasol ointment (TEMOVATE) [id number] % Apply 1 application topically 2 (two) times daily as needed (itching).   1 Past Week at Unknown time  . clotrimazole-betamethasone (LOTRISONE) cream  1 application in the morning and at bedtime.   03/26/2021 at Unknown time  . EUCRISA 2 % OINT Apply 1 application topically daily as needed (dry skin).    03/26/2021 at Unknown time  . losartan (COZAAR) 25 MG tablet TAKE 1 TABLET BY MOUTH EVERY DAY (Patient taking differently: Take 25 mg by mouth daily.) 90 tablet 1 03/26/2021 at Unknown time  . metoprolol succinate (TOPROL-XL) 50 MG 24 hr tablet Take 1 tablet (50 mg total) by mouth daily. Take with or immediately following a meal. 90 tablet 3 03/26/2021 at 0900  . nitroGLYCERIN (NITROSTAT) 0.4 MG SL tablet Place 1 tablet (0.4 mg total) under the tongue every 5 (five) minutes as needed for chest pain. 90 tablet 3 PRN  . OLOPATADINE HCL OP Place 1 drop into both eyes 2 (two) times daily.   03/26/2021 at Unknown time  . rosuvastatin (CRESTOR) 10 MG tablet TAKE 1 TABLET BY MOUTH EVERY DAY (Patient taking differently: Take 10 mg by mouth every evening.) 90 tablet 2 03/25/2021 at Unknown time  . spironolactone (ALDACTONE) 25 MG tablet Take 1 tablet (25 mg total) by mouth every other day. 30 tablet 6 03/26/2021 at Unknown time  . torsemide (DEMADEX) 20 MG tablet Daily (Patient taking differently: Take 20 mg by mouth daily.) 90 tablet 1 03/26/2021 at Unknown time  . triamcinolone cream (KENALOG) 0.1 % Apply topically 2 (two) times daily as needed.   Past Week at Unknown time    Review of Systems  Constitutional: Positive for malaise/fatigue and weight loss. Negative for decreased appetite and weight gain.  HENT: Negative for congestion.   Eyes: Negative for visual disturbance.  Cardiovascular: Positive for dyspnea on exertion and syncope (At the church two weeks ago). Negative for chest pain, leg swelling and palpitations.  Respiratory: Negative for cough.   Endocrine: Negative for cold intolerance.  Hematologic/Lymphatic: Does not bruise/bleed easily.  Skin: Negative for itching and rash.  Musculoskeletal: Negative for myalgias.  Gastrointestinal: Negative  for abdominal pain, nausea and vomiting.  Genitourinary: Negative for dysuria.  Neurological: Negative for dizziness and weakness.  Psychiatric/Behavioral: The patient is not nervous/anxious.   All other systems reviewed and are negative.     Physical Exam: Physical Exam Vitals and nursing note reviewed.  Constitutional:      General: She is not in acute distress.    Appearance: She is cachectic. She is ill-appearing.  HENT:     Head: Normocephalic and atraumatic.  Eyes:     Conjunctiva/sclera: Conjunctivae normal.     Pupils: Pupils are equal, round, and reactive to light.  Neck:     Vascular: JVD present.  Cardiovascular:     Rate and Rhythm: Normal rate and regular rhythm.     Pulses: Normal pulses and intact distal pulses.  Heart sounds: No murmur heard.   Pulmonary:     Effort: Pulmonary effort is normal.     Breath sounds: Examination of the left-middle field reveals rales. Examination of the right-lower field reveals rales. Examination of the left-lower field reveals rales. Rales present. No wheezing.  Abdominal:     General: Bowel sounds are normal.     Palpations: Abdomen is soft.     Tenderness: There is no rebound.  Musculoskeletal:        General: No tenderness. Normal range of motion.     Right lower leg: No edema.     Left lower leg: No edema.  Lymphadenopathy:     Cervical: No cervical adenopathy.  Skin:    General: Skin is warm and dry.  Neurological:     Mental Status: She is alert and oriented to person, place, and time.     Cranial Nerves: No cranial nerve deficit.      Labs:   Lab Results  Component Value Date   WBC 4.3 03/26/2021   HGB 11.1 (L) 03/26/2021   HCT 34.0 (L) 03/26/2021   MCV 85.0 03/26/2021   PLT 223 03/26/2021    Recent Labs  Lab 03/26/21 1523 03/27/21 0445  NA 134* 135  K 4.2 4.1  CL 105 107  CO2 17* 17*  BUN 75* 75*  CREATININE 2.14* 2.23*  CALCIUM 11.0* 10.8*  PROT 7.6  --   BILITOT 1.8*  --   ALKPHOS 46   --   ALT 26  --   AST 28  --   GLUCOSE 89 95    Lipid Panel     Component Value Date/Time   CHOL 145 11/01/2019 1126   TRIG 49 11/01/2019 1126   HDL 76 11/01/2019 1126   CHOLHDL 1.9 11/01/2019 1126   LDLCALC 58 11/01/2019 1126    BNP (last 3 results) Recent Labs    04/26/20 1608 06/05/20 1311 03/26/21 1523  BNP 2,647.7* 1,854.5* >4,500.0*    HEMOGLOBIN A1C N/A Cardiac Panel (last 3 results) N/A  TSH N/A   Radiology: CT ABDOMEN PELVIS WO CONTRAST  Result Date: 03/26/2021 CLINICAL DATA:  Presenting for abnormal labs,, mechanical fall as well. EXAM: CT ABDOMEN AND PELVIS WITHOUT CONTRAST TECHNIQUE: Multidetector CT imaging of the abdomen and pelvis was performed following the standard protocol without IV contrast. COMPARISON:  CT 07/09/2017 FINDINGS: Lower chest: Chronic subpleural reticular changes noted in the lung bases. Additional atelectasis. Lung bases are otherwise clear. Cardiomegaly. Terminus of a pacer lead seen at the cardiac apex. No pericardial effusion. Hepatobiliary: Cyst again seen along the inferomedial liver measuring 1.9 cm in size (3/20), not significantly changed from prior. No concerning focal liver lesion. Punctate calcification in the left lobe liver, nonspecific. Normal hepatic attenuation. Smooth liver surface contour. Gallbladder appears contracted around a calcified gallstone measuring up to 2.2 cm in size. No pericholecystic fluid or inflammation. No significant biliary ductal dilatation accounting for expected senescent change. No intraductal gallstones. Pancreas: No pancreatic ductal dilatation or surrounding inflammatory changes. Spleen: Normal in size. No concerning splenic lesions. Adrenals/Urinary Tract: Normal adrenal glands. Redemonstration of a cyst arising the upper pole right kidney measuring 3.8 cm in size, decreased from comparison prior. Intermediate attenuation 11 mm cyst seen in the posterior upper pole left kidney (3/16) could reflect a  hemorrhagic or proteinaceous cyst though incompletely characterized on this exam. No other visible or contour deforming renal lesions. No urolithiasis or hydronephrosis. Urinary bladder is unremarkable for degree of distension. Stomach/Bowel:  Distal esophagus, stomach and duodenal sweep are unremarkable. No small bowel wall thickening or dilatation. No evidence of obstruction. Appendix is not visualized. No focal inflammation the vicinity of the cecum to suggest an occult appendicitis. Scattered colonic diverticula without focal inflammation to suggest diverticulitis. No colonic dilatation or wall thickening. Vascular/Lymphatic: Atherosclerotic calcifications within the abdominal aorta and branch vessels. No aneurysm or ectasia. Numerous though nonenlarged retroperitoneal nodes are seen. Possibly reactive or edematous. No pathologically enlarged abdominopelvic lymph nodes within the limitations of this unenhanced CT. Reproductive: Anteverted uterus. Benign myometrial vascular calcifications. No suspicious adnexal lesions. Other: Extensive circumferential body wall edema. Mild central mesenteric edematous changes. Musculoskeletal: The osseous structures appear diffusely demineralized which may limit detection of small or nondisplaced fractures. No acute fracture or vertebral body height loss. Dextrocurvature of the thoracolumbar spine, apex L3. Six lumbar vertebrae. Lowest disc space denoted as L6-S1. Multilevel degenerative changes are present in the imaged portions of the spine. Severe sclerotic changes and bony remodeling of the bilateral femoral heads likely reflecting sequela of prior avascular necrosis advanced arthrosis with bilateral acetabular protrusio, right greater than left. Some slight joint space expansion and synovial thickening noted about the right hip with few joint bodies. Additional joint bodies are noted in the left hip articulation but without the same extent of synovia prominence. No acute  fracture or traumatic osseous injury of hips, pelvis. IMPRESSION: 1. No clear acute traumatic findings in the abdomen or pelvis. Unenhanced CT was performed per clinician order. Lack of IV contrast limits sensitivity and specificity, especially for evaluation of abdominal/pelvic solid viscera. 2. Features of developing anasarca with body wall edema, mesenteric edema and likely edematous central mesenteric lymph nodes. Cardiomegaly noted as well. Correlate with patient hydration status or clinical features of heart failure/volume overload. 3. Indeterminate 11 mm hyperdense cystic focus in the upper pole left kidney. Suspect benign hemorrhagic or proteinaceous cyst though incompletely characterized. Consider renal ultrasound or MRI for further characterization 4. No other acute or worrisome urinary tract abnormality. 5. Cholelithiasis without evidence of acute cholecystitis. 6. Diverticulosis without evidence of acute diverticulitis. 7. Aortic Atherosclerosis (ICD10-I70.0). 8. Progressive and severe degenerative changes in the bilateral hips including features of prior avascular necrosis. Right greater than left synovial thickening and effusion, sterility is not completely ascertained on imaging. If there is clinical concern, fluid sampling should be considered. Electronically Signed   By: Lovena Le M.D.   On: 03/26/2021 22:05   US RENAL  Result Date: 03/27/2021 CLINICAL DATA:  Indeterminate left renal cyst EXAM: RENAL / URINARY TRACT ULTRASOUND COMPLETE COMPARISON:  CT 03/26/2021 FINDINGS: Right Kidney: Renal measurements: 8.6 x 3.9 x 4.5 cm = volume: 79 mL. Echogenicity within normal limits. No mass or hydronephrosis visualized. 4.2 cm simple cortical cyst is seen within the anterior interpolar region. Left Kidney: Renal measurements: At least 8.0 x 4.7 x 4.5 cm = volume: 88 mL. Evaluation of the left kidney is significantly limited by depth of the structure as well as overlying bowel gas. In particular, the  upper pole is obscured and the renal cyst in question is not visualized. No hydronephrosis. No intrarenal masses or calcifications within the visualized segment. Preserved cortical thickness and normal cortical echogenicity. Bladder: Appears normal for degree of bladder distention. Other: None. IMPRESSION: Technically limited examination, due to overlying bowel gas, with nonvisualization of the questioned cystic lesion within the upper pole of the left kidney. Dedicated renal mass protocol CT or MRI examination with contrast is recommended for definitive characterization of the  question lesion, if indicated. Electronically Signed   By: Fidela Salisbury MD   On: 03/27/2021 01:18   ECHOCARDIOGRAM COMPLETE  Result Date: 03/27/2021    ECHOCARDIOGRAM REPORT   Patient Name:   NAMRATA SEANEZ Date of Exam: 03/27/2021 Medical Rec #:  [id number]      Height:       59.0 in Accession #:    [id number]     Weight:       128.0 lb Date of Birth:  August 01, 1941      BSA:          1.526 m Patient Age:    58 years       BP:           114/72 mmHg Patient Gender: F              HR:           61 bpm. Exam Location:  Inpatient Procedure: 2D Echo, Cardiac Doppler and Color Doppler Indications:    CHF  History:        Patient has no prior history of Echocardiogram examinations,                 most recent 08/22/2020. Pacemaker; Risk Factors:Hypertension.  Sonographer:    Cammy Brochure Referring Phys: [id number] [place] T TU IMPRESSIONS  1. Left ventricular ejection fraction, by estimation, is <20%. The left ventricle has severely decreased function. The left ventricle demonstrates global hypokinesis. There is mild left ventricular hypertrophy. Left ventricular diastolic parameters are indeterminate.  2. Right ventricular systolic function is normal. The right ventricular size is normal.  3. Left atrial size was severely dilated.  4. Right atrial size was severely dilated.  5. The mitral valve is normal in structure. Severe mitral valve  regurgitation. No evidence of mitral stenosis.  6. Tricuspid valve regurgitation is severe.  7. The aortic valve is normal in structure. There is mild calcification of the aortic valve. There is mild thickening of the aortic valve. Aortic valve regurgitation is trivial. Mild to moderate aortic valve sclerosis/calcification is present, without any evidence of aortic stenosis.  8. The inferior vena cava is dilated in size with <50% respiratory variability, suggesting right atrial pressure of 15 mmHg. FINDINGS  Left Ventricle: Left ventricular ejection fraction, by estimation, is <20%. The left ventricle has severely decreased function. The left ventricle demonstrates global hypokinesis. The left ventricular internal cavity size was normal in size. There is mild left ventricular hypertrophy. Left ventricular diastolic parameters are indeterminate. Right Ventricle: The right ventricular size is normal. No increase in right ventricular wall thickness. Right ventricular systolic function is normal. Left Atrium: Left atrial size was severely dilated. Right Atrium: Right atrial size was severely dilated. Pericardium: There is no evidence of pericardial effusion. Mitral Valve: The mitral valve is normal in structure. Severe mitral valve regurgitation. No evidence of mitral valve stenosis. Tricuspid Valve: The tricuspid valve is normal in structure. Tricuspid valve regurgitation is severe. No evidence of tricuspid stenosis. Aortic Valve: The aortic valve is normal in structure. There is mild calcification of the aortic valve. There is mild thickening of the aortic valve. Aortic valve regurgitation is trivial. Mild to moderate aortic valve sclerosis/calcification is present,  without any evidence of aortic stenosis. Aortic valve mean gradient measures 3.0 mmHg. Aortic valve peak gradient measures 5.3 mmHg. Aortic valve area, by VTI measures 1.36 cm. Pulmonic Valve: The pulmonic valve was normal in structure. Pulmonic valve  regurgitation is mild. No evidence  of pulmonic stenosis. Aorta: The aortic root is normal in size and structure. Venous: The inferior vena cava is dilated in size with less than 50% respiratory variability, suggesting right atrial pressure of 15 mmHg. IAS/Shunts: No atrial level shunt detected by color flow Doppler. Additional Comments: A device lead is visualized.  LEFT VENTRICLE PLAX 2D LVIDd:         5.80 cm LVIDs:         5.20 cm LV PW:         1.50 cm LV IVS:        1.00 cm LVOT diam:     2.20 cm LV SV:         28 LV SV Index:   18 LVOT Area:     3.80 cm  RIGHT VENTRICLE            IVC RV Basal diam:  4.60 cm    IVC diam: 2.70 cm RV S prime:     8.93 cm/s TAPSE (M-mode): 1.7 cm LEFT ATRIUM             Index       RIGHT ATRIUM           Index LA diam:        5.10 cm 3.34 cm/m  RA Area:     19.30 cm LA Vol (A2C):   96.3 ml 63.12 ml/m RA Volume:   55.60 ml  36.44 ml/m LA Vol (A4C):   89.3 ml 58.53 ml/m LA Biplane Vol: 93.0 ml 60.96 ml/m  AORTIC VALVE AV Area (Vmax):    1.31 cm AV Area (Vmean):   1.32 cm AV Area (VTI):     1.36 cm AV Vmax:           115.00 cm/s AV Vmean:          78.100 cm/s AV VTI:            0.205 m AV Peak Grad:      5.3 mmHg AV Mean Grad:      3.0 mmHg LVOT Vmax:         39.60 cm/s LVOT Vmean:        27.200 cm/s LVOT VTI:          0.074 m LVOT/AV VTI ratio: 0.36  AORTA Ao Root diam: 2.70 cm Ao Asc diam:  2.60 cm MITRAL VALVE                TRICUSPID VALVE MV Area (PHT): 4.15 cm     TR Peak grad:   45.4 mmHg MV Decel Time: 183 msec     TR Vmax:        337.00 cm/s MV E velocity: 106.00 cm/s                             SHUNTS                             Systemic VTI:  0.07 m                             Systemic Diam: 2.20 cm Candee Furbish MD Electronically signed by Candee Furbish MD Signature Date/Time: 03/27/2021/2:32:30 PM    Final    DG Hip Unilat W or Wo Pelvis 2-3 Views Right  Result Date: 03/26/2021 CLINICAL DATA:  80 year old  female with fall and right hip pain. EXAM: DG HIP (WITH  OR WITHOUT PELVIS) 2-3V RIGHT COMPARISON:  Pelvic radiograph dated 10/07/2018 FINDINGS: There is no acute fracture or dislocation. The bones are osteopenic. Severe bilateral hip arthritic changes with flattening of the left femoral head and right protrusio acetabuli. Degenerative changes of the visualized lower lumbar spine. The soft tissues are unremarkable. IMPRESSION: 1. No acute fracture or dislocation. 2. 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 Electronically Signed   By: Anner Crete M.D.   On: 03/26/2021 16:42    Scheduled Meds: . apixaban  2.5 mg Oral BID  . vitamin C  1,000 mg Oral Daily  . furosemide  60 mg Intravenous Daily  . metoprolol succinate  50 mg Oral Daily  . rosuvastatin  10 mg Oral QPM  . spironolactone  25 mg Oral QODAY   Continuous Infusions: PRN Meds:.ALPRAZolam  CARDIAC STUDIES:  EKG 03/27/2021: AV dual paced rhythm  1 Brief NSVT episode on pacemaker interrogation on 02/14/2021 No recent alert  Echocardiogram: Pending  Echocardiogram 08/22/2020:  Left ventricle cavity is moderately dilated. Mild concentric hypertrophy  of the left ventricle. Abnormal septal wall motion due to right ventricle  pacemaker. Severe global hypokinesis. LVEF 25-30%. Diastolic function not  assessed due to paced rhythm.   Left atrial cavity is severely dilated.  Right atrial cavity is mildly dilated.  Structurally normal trileaflet aortic valve. Mild (Grade I) aortic  regurgitation.  Moderate to severe mitral regurgitation.  Moderate tricuspid regurgitation.  No evidence of pulmonary hypertension.  Compared to previous study on 04/05/2019, there is further progression of  mitral and tricuspid valve regurgitation.    Assessment & Recommendations:  80 year old [place] American female with hypertension, dual-chamber pacemaker placement for AV block, moderate, medically managed coronary artery disease (LCx), hypertension, NSVT,  HFrEF  Acute on chronic systolic heart failure: Acute decompensation. She has cardiac cachexia, with rales on exam. No other overt edema. BNP >4500 Conitnue IV lasix 60 mg daily. Strict I/I, daily weights.  Hold Spironolactone and metoprolol in the setting of acute decompensation and AKI Patient has had 100% RV pacing, thought be to be possibly one of the reasons for her worsneing systolic heart failure, along with the possibility of ischemic cardiomyopathy. She has declined invasive workup and management, including coronary angiography, possible CRT. At this point, I think it is too late in her disease course for any invasive workup or management. I briefly discussed goals of care with the patient and daughter Holly [name]. They would like to have palliative care consultation and even consider hospice, if she would be a candidate. They have not made a decision about the code status yet, though.  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 Diuresis will help relieve gut edema.  Recommend nutrition consuilt  AKI: Cardiorenal syndrome. Continue diuresis. I ancipitate her Cr will improve with decongestion of kidneys.   PAF: Continue eliquis 2.5 mg bid     Nigel Mormon, MD Pager: [phone] Office: [phone]

## 2021-03-29 DIAGNOSIS — Z515 Encounter for palliative care: Secondary | ICD-10-CM

## 2021-03-29 DIAGNOSIS — I509 Heart failure, unspecified: Secondary | ICD-10-CM | POA: Diagnosis not present

## 2021-03-29 DIAGNOSIS — Z7189 Other specified counseling: Secondary | ICD-10-CM

## 2021-03-29 DIAGNOSIS — N179 Acute kidney failure, unspecified: Secondary | ICD-10-CM | POA: Diagnosis not present

## 2021-03-29 DIAGNOSIS — E440 Moderate protein-calorie malnutrition: Secondary | ICD-10-CM | POA: Insufficient documentation

## 2021-03-29 DIAGNOSIS — R531 Weakness: Secondary | ICD-10-CM | POA: Diagnosis not present

## 2021-03-29 MED ORDER — ENSURE ENLIVE PO LIQD
237.0000 mL | Freq: Two times a day (BID) | ORAL | Status: DC
  Administered 2021-03-29 – 2021-04-08 (×17): 237 mL via ORAL

## 2021-03-29 MED ORDER — SENNOSIDES-DOCUSATE SODIUM 8.6-50 MG PO TABS
1.0000 | ORAL_TABLET | Freq: Every day | ORAL | Status: DC
  Administered 2021-03-29 – 2021-04-07 (×10): 1 via ORAL
  Filled 2021-03-29 (×10): qty 1

## 2021-03-29 MED ORDER — ADULT MULTIVITAMIN W/MINERALS CH
1.0000 | ORAL_TABLET | Freq: Every day | ORAL | Status: DC
  Administered 2021-03-29 – 2021-04-08 (×10): 1 via ORAL
  Filled 2021-03-29 (×10): qty 1

## 2021-03-29 MED ORDER — ACETAMINOPHEN 325 MG PO TABS
650.0000 mg | ORAL_TABLET | Freq: Once | ORAL | Status: AC
  Administered 2021-03-29: 650 mg via ORAL
  Filled 2021-03-29: qty 2

## 2021-03-29 MED ORDER — FUROSEMIDE 10 MG/ML IJ SOLN
60.0000 mg | Freq: Once | INTRAMUSCULAR | Status: DC
  Filled 2021-03-29: qty 6

## 2021-03-29 MED ORDER — POLYETHYLENE GLYCOL 3350 17 G PO PACK
17.0000 g | PACK | Freq: Every day | ORAL | Status: DC
  Administered 2021-03-29 – 2021-04-07 (×6): 17 g via ORAL
  Filled 2021-03-29 (×9): qty 1

## 2021-03-29 NOTE — Progress Notes (Addendum)
Subjective:  Feels better Eating breakfast this morning Breathing is improved  Only 600 cc urine output documented, but has had episodes of soaked sheets, per the daughter.  Now has [employer]  Telemetry reviewed. One episode of 6 beat NSVT  Objective:  Vital Signs in the last 24 hours: Temp:  [97.5 F (36.4 C)-98 F (36.7 C)] 97.5 F (36.4 C) (05/26 0800) Pulse Rate:  [53-63] 59 (05/26 0800) Resp:  [16-20] 16 (05/25 2015) BP: (97-109)/(65-73) 97/65 (05/26 0800) SpO2:  [97 %-100 %] 100 % (05/26 0800) Weight:  [54.6 kg] 54.6 kg (05/26 0118)  Intake/Output from previous day: 05/25 0701 - 05/26 0700 In: 675 [P.O.:600] Out: 600 [Urine:600]  Physical Exam Vitals and nursing note reviewed.  Constitutional:      General: She is not in acute distress.    Appearance: She is well-developed. She is cachectic.  HENT:     Head: Normocephalic and atraumatic.  Eyes:     Conjunctiva/sclera: Conjunctivae normal.     Pupils: Pupils are equal, round, and reactive to light.  Neck:     Vascular: JVD present.  Cardiovascular:     Rate and Rhythm: Normal rate and regular rhythm.     Pulses: Normal pulses and intact distal pulses.     Heart sounds: No murmur heard.   Pulmonary:     Effort: Pulmonary effort is normal.     Breath sounds: Examination of the right-middle field reveals rales. Examination of the right-lower field reveals rales. Rales present. No wheezing.  Abdominal:     General: Bowel sounds are normal.     Palpations: Abdomen is soft.     Tenderness: There is no rebound.  Musculoskeletal:        General: No tenderness. Normal range of motion.     Right lower leg: No edema.     Left lower leg: No edema.  Lymphadenopathy:     Cervical: No cervical adenopathy.  Skin:    General: Skin is warm and dry.  Neurological:     Mental Status: She is alert and oriented to person, place, and time.     Cranial Nerves: No cranial nerve deficit.      Lab Results: BMP Recent  Labs    05/19/20 1114 06/05/20 1311 08/25/20 1601 03/26/21 1523 03/27/21 0445 03/28/21 0857  NA 130* 131* 132* 134* 135 135  K 4.7 4.5 4.8 4.2 4.1 3.6  CL 95* 97 98 105 107 104  CO2 17* 21 20 17* 17* 19*  GLUCOSE 99 79 109* 89 95 106*  BUN 18 25 37* 75* 75* 73*  CREATININE 1.21* 1.29* 1.36* 2.14* 2.23* 2.26*  CALCIUM 12.1* 11.9* 10.4* 11.0* 10.8* 10.7*  GFRNONAA 42* 39* 37* 23* 22* 21*  GFRAA 49* 45* 42*  --   --   --     CBC Recent Labs  Lab 03/26/21 1523  WBC 4.3  RBC 4.00  HGB 11.1*  HCT 34.0*  PLT 223  MCV 85.0  MCH 27.8  MCHC 32.6  RDW 15.9*  LYMPHSABS 0.8  MONOABS 0.7  EOSABS 0.2  BASOSABS 0.1     BNP (last 3 results) Recent Labs    04/26/20 1608 06/05/20 1311 03/26/21 1523  BNP 2,647.7* 1,854.5* >4,500.0*    Lipid Panel     Component Value Date/Time   CHOL 145 11/01/2019 1126   TRIG 49 11/01/2019 1126   HDL 76 11/01/2019 1126   CHOLHDL 1.9 11/01/2019 1126   LDLCALC 58 11/01/2019 1126  Hepatic Function Panel Recent Labs    03/26/21 1523 03/28/21 0857  PROT 7.6 7.0  ALBUMIN 3.5 3.1*  AST 28 38  ALT 26 34  ALKPHOS 46 45  BILITOT 1.8* 1.7*    CARDIAC STUDIES:  EKG 03/27/2021: AV dual paced rhythm  1 Brief NSVT episode on pacemaker interrogation on 02/14/2021 No recent alert  Echocardiogram 03/27/2021: 1. Left ventricular ejection fraction, by estimation, is <20%. The left  ventricle has severely decreased function. The left ventricle demonstrates  global hypokinesis. There is mild left ventricular hypertrophy. Left  ventricular diastolic parameters are  indeterminate.  2. Right ventricular systolic function is normal. The right ventricular  size is normal.  3. Left atrial size was severely dilated.  4. Right atrial size was severely dilated.  5. The mitral valve is normal in structure. Severe mitral valve  regurgitation. No evidence of mitral stenosis.  6. Tricuspid valve regurgitation is severe.  7. The aortic  valve is normal in structure. There is mild calcification  of the aortic valve. There is mild thickening of the aortic valve. Aortic  valve regurgitation is trivial. Mild to moderate aortic valve  sclerosis/calcification is present, without  any evidence of aortic stenosis.  8. The inferior vena cava is dilated in size with <50% respiratory  variability, suggesting right atrial pressure of 15 mmHg.   Assessment & Recommendations:  80 year old [place] American female with hypertension, dual-chamber pacemaker placement for AV block, moderate, medically managed coronary artery disease (LCx), hypertension, NSVT, HFrEF  Acute on chronic systolic heart failure: Acute decompensation. She has cardiac cachexia, with rales on exam. No other overt edema. LVEF <20%. BNP >4500 Conitnue IV lasix 60 mg daily. Strict I/I, daily weights.  If she has not had about 1 L urine output by 2 PM, should repeat another 60 mg IV lasix. Hold Spironolactone and metoprolol in the setting of acute decompensation and AKI Patient has had 100% RV pacing, thought be to be possibly one of the reasons for her worsneing systolic heart failure, along with the possibility of ischemic cardiomyopathy. She has declined invasive workup and management, including coronary angiography, possible CRT. At this point, I think it is too late in her disease course for any invasive workup or management. I discussed goals of care with the patient and daughter Ivin Booty. Patient still wants to be full code, but interested n revieiwing palliative care options.  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 Diuresis will help relieve gut edema.  Appears much more energetic today. Recommend nutrition consuilt  AKI: Cardiorenal syndrome. Continue diuresis. I ancipitate her Cr will improve with decongestion of kidneys.   PAF: Continue eliquis 2.5 mg bid   Nigel Mormon, MD Pager: [phone] Office: [phone]

## 2021-03-29 NOTE — Progress Notes (Addendum)
PROGRESS NOTE    Holly [name]  [id number] DOB: 07/31/1941 DOA: 03/26/2021 PCP: Merrilee Seashore, MD  Brief Narrative:  80 y.o. female with medical history significant for heart block s/p pacemaker, paroxysmal atrial fibrillation on Eliquis, combined systolic and diastolic CHF, hypertension, CKD 3a, and chronic iron deficiency anemia, hyperlipidemia, anxiety who presents at the request of her PCP for abnormal labs. She complains of increasing shortness of breath both at rest and with exertion for the past 2 months.  For the past month she has also noticed upper abdominal pain every time that she eats.  Has had decreased p.o. intake because of this pain and because of lack of appetite. CT of the abdomen obtained shows contracted gallbladder with cholelithiasis but no cholecystitis.   There is also notable anasarca of the abdominal wall and edema of the mesenteric lymphatics.  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 etc. -palliative care has seen the patient, family wishes to continue medical  management for now  AKI on CKD stage IIIa -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 given above -Creatinine relatively stable has not worsened but does have metabolic acidosis likely from ATN -Labs again tomorrow  Cholelithiasis without cholecystitis - Postprandial fullness/pain over the past few weeks  - Hold off on further imaging, CT abdomen pelvis shows cholelithiasis without cholecystitis as well as diverticulosis without diverticulitis -Continue heart healthy diet  Paroxysmal atrial fibrillation - Continue Eliquis 2.5 twice daily -Rate currently well controlled on Toprol-XL 50  Left upper pole kidney cyst, incidental finding - Cysts suspected to be benign hemorrhagic or proteinaceous cyst -  Renal ultrasound relatively benign--no further w/u  History of heart block s/p pacemaker - Stable  Hypertension  -hold Losartan due to AKI -continue metoprolol  Hyperlipidemia -continue statin  DVT prophylaxis: Eliquis Code Status: Full Family Communication: Daughter at bedside  Status is: Inpatient  Dispo: The patient is from: Home              Anticipated d/c is to: Awaiting PT eval appreciate them seeing patient today              Anticipated d/c date is: Probably within the next 48 hours              Patient currently not medically stable for discharge  Consultants:   None  Procedures:   None indicated  Antimicrobials:  None indicated  Subjective:   frail No chest pain Breathing comfortably  Objective: Vitals:   03/28/21 1705 03/28/21 2015 03/29/21 0118 03/29/21 0800  BP: 102/68 97/66  97/65  Pulse: 63 (!) 53  (!) 59  Resp: 18  16    Temp: 97.8 F (36.6 C) 98 F (36.7 C)  (!) 97.5 F (36.4 C)  TempSrc: Oral   Oral  SpO2: 98% 97%  100%  Weight:   54.6 kg   Height:        Intake/Output Summary (Last 24 hours) at 03/29/2021 1517 Last data filed at 03/29/2021 1350 Gross per 24 hour  Intake 700 ml  Output 950 ml  Net -250 ml   Filed  Weights   03/27/21 1236 03/28/21 0120 03/29/21 0118  Weight: 57.9 kg 53.5 kg 54.6 kg    Examination:  edentulous frail appearing black female cannot appreciate JVD S1-S2 no murmur-on monitors does have a paced rhythm Chest clear posterolaterally Abdomen soft No lower extremity edema No rash Neurologically power is grossly 5/5 sensory deferred   Data Reviewed: I have personally reviewed following labs and imaging studies  CBC: Recent Labs  Lab 03/26/21 1523  WBC 4.3  NEUTROABS 2.5  HGB 11.1*  HCT 34.0*  MCV 85.0  PLT [id number]   Basic Metabolic Panel: Recent Labs  Lab 03/26/21 1523 03/27/21 0445 03/28/21 0857  NA 134* 135 135  K 4.2 4.1 3.6  CL 105 107 104  CO2 17* 17* 19*  GLUCOSE 89 95 106*  BUN 75* 75* 73*  CREATININE 2.14* 2.23* 2.26*  CALCIUM 11.0* 10.8* 10.7*   GFR: Estimated Creatinine Clearance: 17.1 mL/min (A) (by C-G formula based on SCr of 2.26 mg/dL (H)). Liver Function Tests: Recent Labs  Lab 03/26/21 1523 03/28/21 0857  AST 28 38  ALT 26 34  ALKPHOS 46 45  BILITOT 1.8* 1.7*  PROT 7.6 7.0  ALBUMIN 3.5 3.1*   Recent Labs  Lab 03/26/21 1523  LIPASE 46   No results for input(s): AMMONIA in the last 168 hours. Coagulation Profile: No results for input(s): INR, PROTIME in the last 168 hours. Cardiac Enzymes: No results for input(s): CKTOTAL, CKMB, CKMBINDEX, TROPONINI in the last 168 hours. BNP (last 3 results) Recent Labs    07/20/20 1201  PROBNP 9,907*   HbA1C: No results for input(s): HGBA1C in the last 72 hours. CBG: No results for input(s): GLUCAP in the last 168 hours. Lipid Profile: No results for input(s): CHOL, HDL, LDLCALC, TRIG, CHOLHDL, LDLDIRECT in the last 72 hours. Thyroid Function Tests: No results for input(s): TSH, T4TOTAL, FREET4, T3FREE, THYROIDAB in the last 72 hours. Anemia Panel: No results for input(s): VITAMINB12, FOLATE, FERRITIN, TIBC, IRON, RETICCTPCT in the last 72 hours. Sepsis Labs: No results for  input(s): PROCALCITON, LATICACIDVEN in the last 168 hours.  Recent Results (from the past 240 hour(s))  Resp Panel by RT-PCR (Flu A&B, Covid) Nasopharyngeal Swab     Status: None   Collection Time: 03/26/21 11:43 PM   Specimen: Nasopharyngeal Swab; Nasopharyngeal(NP) swabs in vial transport medium  Result Value Ref Range Status   SARS Coronavirus 2 by RT PCR NEGATIVE NEGATIVE Final    Comment: (NOTE) SARS-CoV-2 target nucleic acids are NOT DETECTED.  The SARS-CoV-2 RNA is generally detectable in upper respiratory specimens during the acute phase of infection. The lowest concentration of SARS-CoV-2 viral copies this assay can detect is 138 copies/mL. A negative result does not preclude SARS-Cov-2 infection and should not be used as the sole basis for treatment or other patient management decisions. A negative result may occur with  improper specimen collection/handling, submission of specimen other than nasopharyngeal swab, presence of viral mutation(s) within the areas targeted by this assay, and inadequate number of viral copies(<138  copies/mL). A negative result must be combined with clinical observations, patient history, and epidemiological information. The expected result is Negative.  Fact Sheet for Patients:  EntrepreneurPulse.com.au  Fact Sheet for Healthcare Providers:  IncredibleEmployment.be  This test is no t yet approved or cleared by the [place] FDA and  has been authorized for detection and/or diagnosis of SARS-CoV-2 by FDA under an Emergency Use Authorization (EUA). This EUA will remain  in effect (meaning this test can be used) for the duration of the COVID-19 declaration under Section 564(b)(1) of the Act, 21 U.S.C.section 360bbb-3(b)(1), unless the authorization is terminated  or revoked sooner.       Influenza A by PCR NEGATIVE NEGATIVE Final   Influenza B by PCR NEGATIVE NEGATIVE Final    Comment: (NOTE) The  Xpert Xpress SARS-CoV-2/FLU/RSV plus assay is intended as an aid in the diagnosis of influenza from Nasopharyngeal swab specimens and should not be used as a sole basis for treatment. Nasal washings and aspirates are unacceptable for Xpert Xpress SARS-CoV-2/FLU/RSV testing.  Fact Sheet for Patients: EntrepreneurPulse.com.au  Fact Sheet for Healthcare Providers: IncredibleEmployment.be  This test is not yet approved or cleared by the [place] FDA and has been authorized for detection and/or diagnosis of SARS-CoV-2 by FDA under an Emergency Use Authorization (EUA). This EUA will remain in effect (meaning this test can be used) for the duration of the COVID-19 declaration under Section 564(b)(1) of the Act, 21 U.S.C. section 360bbb-3(b)(1), unless the authorization is terminated or revoked.  Performed at [facility], [place] [address], [place], [place] [address]          Radiology Studies: No results found. Scheduled Meds: . apixaban  2.5 mg Oral BID  . vitamin C  1,000 mg Oral Daily  . feeding supplement  237 mL Oral BID BM  . furosemide  60 mg Intravenous Daily  . multivitamin with minerals  1 tablet Oral Daily  . rosuvastatin  10 mg Oral QPM   Continuous Infusions:   LOS: 2 days   Time spent: [place], MD [facility] 3:23 PM   If 7PM-7AM, please contact night-coverage [email or web address]  03/29/2021, 3:17 PM

## 2021-03-29 NOTE — Progress Notes (Signed)
This chaplain responded to PMT referral for creating the Pt. HCPOA.  The Pt. is sitting up in the bed and able to participate in [place] education.  The Pt. daughter-Valerie is at the bedside.  The chaplain joined the Pt., notary, and witnesses in the notarizing of the Pt. HCPOA.  The Pt. named Lacoria Zawada as her healthcare agent. If this person is unwilling or unable to serve in this role, the Pt. names Leanord Asal as the healthcare agent.  The chaplain returned the original HCPOA to the Pt. along with two copies.  A copy was scanned to the Pt. EMR.    This chaplain is available for F/U spiritual care as needed.

## 2021-03-29 NOTE — Consult Note (Signed)
Consultation Note Date: 03/29/2021   Patient Name: Holly [name]  DOB: 12-03-1940  MRN: [id number]  Age / Sex: 80 y.o., female  PCP: Merrilee Seashore, MD Referring Physician: Nita Sells, MD  Reason for Consultation: Establishing goals of care and Psychosocial/spiritual support  HPI/Patient Profile: 80 y.o. female  with past medical history of heart block s/p pacemaker, paroxysmal atrial fibrillation on Eliquis, combined systolic and diastolic CHF, hypertension, CKD 3a, and deficiency anemia, hyperlipidemia, anxiety who presents at the request of her PCP for abnormal labs. Admitted on 03/26/2021 with AKI on CKD, CHF progression.   Patient saw her PCP earlier this week due to concerns of increasing shortness of breath both at rest and with exertion for the past 2 weeks. Also with upper abdominal pain for the past month post-prandially. Decreased oral intake, lack of appetite. She then had lab work done and was told it was abnormal and to present to the ED.  Noted some lower extremity edema but feels it is no worse than usual.  She takes Eliquis for atrial fibrillation and periodically misses her her dose because she forgets to take it.  On admission found BNP >4500, Cr 2.1 (baseline 1.3). EF found to be 20%, has PPM and AICD in place. 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 AKI on CKD felt prerenal/worsening cardiorenal syndrome.  CT of the abdomen obtained shows contracted gallbladder with cholelithiasis but no cholecystitis.  There is also notable anasarca of the abdominal wall and edema of the mesenteric lymphatics.  Palliative Care requested for [place] discussion.  Clinical Assessment and Goals of Care:  This NP Walden Field and Wadie Lessen  NP reviewed medical records, received report from team, assessed the patient and then meet at the  patient's bedside with her daughter Holly [name] to discuss diagnosis, prognosis, GOC, EOL wishes disposition and options.   Concept of Palliative Care was introduced as specialized medical care for people and their families living with serious illness.  If focuses on providing relief from the symptoms and stress of a serious illness.  The goal is to improve quality of life for both the patient and the family. Values and goals of care important to patient and family were attempted to be elicited.  Created space and opportunity for patient  and family to explore thoughts and feelings regarding current medical situation   A  discussion was had today regarding advanced directives.  Concepts specific to code status, artifical feeding and hydration, continued IV antibiotics and rehospitalization was had.  The difference between a aggressive medical intervention path  and a palliative comfort care path for this patient at this time was had.     The patient and daughter were clear that they want full code, continue all medical treatment options that are available to them.  No advanced directive paperwork on file, but they agree with completing HCPOA documents naming daughters Holly [name] and Holly [name] as joint HCPOA.  They have adequate support at home, but are wanting medical expert help though a service such as home health care and/or outpatient palliative care.   Natural trajectory and expectations at EOL were discussed.  Questions and concerns addressed.  Patient  encouraged to call with questions or concerns.     PMT will continue to support holistically.     No documented HPOA at this time.  Primary Decision Maker: PATIENT    SUMMARY OF RECOMMENDATIONS    Full  Code  Complete HCPOA paperwork  Continue full scope of treatment  Plan for [facility] and/or outpatient palliative care ([facility]) when appropriate for d/c  Code Status/Advance Care Planning:  Full code  Palliative  Prophylaxis:   Frequent Pain Assessment  Additional Recommendations (Limitations, Scope, Preferences):  Full Scope Treatment  Psycho-social/Spiritual:   Desire for further Chaplaincy support:yes  Additional Recommendations: Caregiving  Support/Resources  Prognosis:   Unable to determine  Discharge Planning: To Be Determined      Primary Diagnoses: Present on Admission: . AKI (acute kidney injury) ([place]) . S/P placement of cardiac pacemaker . Essential hypertension . Paroxysmal A-fib ([place]) . Acute systolic heart failure ([place])   I have reviewed the medical record, interviewed the patient and family, and examined the patient. The following aspects are pertinent.  Past Medical History:  Diagnosis Date  . AV block, 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[place]) 07/15/2017  . Bronchitis   . Encounter for care of pacemaker 05/12/2019  . Hypertension   . Pacemaker - MRI safe Medtronic Azure XT DR MRI [id number] Dual chamber PPM in situ 04/24/2019   Social History   Socioeconomic History  . Marital status: Divorced    Spouse name: Not on file  . Number of children: 7  . Years of education: Not on file  . Highest education level: Not on file  Occupational History  . Not on file  Tobacco Use  . Smoking status: Never Smoker  . Smokeless tobacco: Never Used  Vaping Use  . Vaping Use: Never used  Substance and Sexual Activity  . Alcohol use: No  . Drug use: Never  . Sexual activity: Not on file  Other Topics Concern  . Not on file  Social History Narrative  . Not on file   Social Determinants of Health   Financial Resource Strain: Not on file  Food Insecurity: Not on file  Transportation Needs: Not on file  Physical Activity: Not on file  Stress: Not on file  Social Connections: Not on file   Family History  Problem Relation Age of Onset  . Hypertension Mother   . ALS Sister   . Heart attack Brother   . Diabetes Daughter    Scheduled Meds: . apixaban  2.5 mg Oral BID  . vitamin C  1,000  mg Oral Daily  . furosemide  60 mg Intravenous Daily  . rosuvastatin  10 mg Oral QPM   Continuous Infusions: PRN Meds:.ALPRAZolam Medications Prior to Admission:  Prior to Admission medications   Medication Sig Start Date End Date Taking? Authorizing Provider  ALPRAZolam Duanne Moron) 0.25 MG tablet Take 1 tablet by mouth daily as needed for anxiety.  01/14/19  Yes [provider]  apixaban (ELIQUIS) 2.5 MG TABS tablet Take 1 tablet by mouth in the morning and at bedtime.   Yes [provider]  Ascorbic Acid (VITAMIN C) 1000 MG tablet Take 1,000 mg by mouth daily.   Yes [provider]  clobetasol ointment (TEMOVATE) [id number] % Apply 1 application topically 2 (two) times daily as needed (itching).  08/02/17  Yes [provider]  clotrimazole-betamethasone (LOTRISONE) cream 1 application in the morning and at bedtime. 01/16/17  Yes [provider]  EUCRISA 2 % OINT Apply 1 application topically daily as needed (dry skin).  01/14/19  Yes [provider]  losartan (COZAAR) 25 MG tablet TAKE 1 TABLET BY MOUTH EVERY DAY Patient taking differently: Take 25 mg by mouth daily. 01/29/21  Yes Patwardhan, Reynold Bowen, MD  metoprolol succinate (TOPROL-XL)  50 MG 24 hr tablet Take 1 tablet (50 mg total) by mouth daily. Take with or immediately following a meal. 04/21/20  Yes Patwardhan, Manish J, MD  nitroGLYCERIN (NITROSTAT) 0.4 MG SL tablet Place 1 tablet (0.4 mg total) under the tongue every 5 (five) minutes as needed for chest pain. 09/23/19 09/13/20 Yes Patwardhan, Manish J, MD  OLOPATADINE HCL OP Place 1 drop into both eyes 2 (two) times daily.   Yes [provider]  rosuvastatin (CRESTOR) 10 MG tablet TAKE 1 TABLET BY MOUTH EVERY DAY Patient taking differently: Take 10 mg by mouth every evening. 03/20/21  Yes Patwardhan, Manish J, MD  spironolactone (ALDACTONE) 25 MG tablet Take 1 tablet (25 mg total) by mouth every other day. 01/10/21 04/10/21 Yes Patwardhan,  Manish J, MD  torsemide (DEMADEX) 20 MG tablet Daily Patient taking differently: Take 20 mg by mouth daily. 02/02/21  Yes Patwardhan, Manish J, MD  triamcinolone cream (KENALOG) 0.1 % Apply topically 2 (two) times daily as needed. 06/01/20  Yes [provider]   Allergies  Allergen Reactions  . Entresto [Sacubitril-Valsartan] Itching, Rash and Cough  . Lisinopril Cough  . Venlafaxine Rash   Review of Systems  Constitutional: Positive for activity change, appetite change and unexpected weight change.  Respiratory: Positive for shortness of breath (improved somewhat today).   Cardiovascular: Positive for leg swelling (improved somewhat today).    Physical Exam Vitals and nursing note reviewed.  Constitutional:      General: She is not in acute distress.    Appearance: Normal appearance. She is normal weight. She is not toxic-appearing.  Neurological:     General: No focal deficit present.     Mental Status: She is alert.  Psychiatric:        Mood and Affect: Mood normal.        Behavior: Behavior normal.        Thought Content: Thought content normal.     Vital Signs: BP 97/65 (BP Location: Left Arm)   Pulse (!) 59   Temp (!) 97.5 F (36.4 C) (Oral)   Resp 16   Ht '5\' 4"'$  (1.626 m)   Wt 54.6 kg   SpO2 100%   BMI 20.66 kg/m  Pain Scale: 0-10   Pain Score: 0-No pain   SpO2: SpO2: 100 % O2 Device:SpO2: 100 % O2 Flow Rate: .   IO: Intake/output summary:   Intake/Output Summary (Last 24 hours) at 03/29/2021 1316 Last data filed at 03/29/2021 0941 Gross per 24 hour  Intake 460 ml  Output 400 ml  Net 60 ml    LBM: Last BM Date: 03/24/21 Baseline Weight: Weight: 57.9 kg Most recent weight: Weight: 54.6 kg     Palliative Assessment/Data:   Discussed with Dr Verlon Au  Time In: 1300 Time Out: 1415 Time Total: 75 minutes Greater than 50%  of this time was spent counseling and coordinating care related to the above assessment and plan.  Signed by: Wadie Lessen, NP   Please contact Palliative Medicine Team phone at [phone] for questions and concerns.  For individual provider: See Shea Evans

## 2021-03-29 NOTE — Progress Notes (Signed)
Initial Nutrition Assessment  DOCUMENTATION CODES:   Non-severe (moderate) malnutrition in context of chronic illness  INTERVENTION:   -MVI with minerals daily -Ensure Enlive po BID, each supplement provides 350 kcal and 20 grams of protein -Downgrade diet to dysphagia 3 (advanced mechanical soft) for ease of intake -Recommend initiation of bowel regimen  NUTRITION DIAGNOSIS:   Moderate Malnutrition related to chronic illness (CHF) as evidenced by mild fat depletion,moderate fat depletion,moderate muscle depletion,severe muscle depletion.  GOAL:   Patient will meet greater than or equal to 90% of their needs  MONITOR:   PO intake,Supplement acceptance,Labs,Weight trends,Skin,I & O's  REASON FOR ASSESSMENT:   Consult Assessment of nutrition requirement/status  ASSESSMENT:   Holly [name] is a 80 y.o. 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  Pt admitted with AKI on CKD stage IIIa.   Reviewed I/O's: +75 ml x 24 hours and +756 ml since admission  UOP: 600 ml x 24 hours  Case discussed with RN, who reports pt is tolerating meals and medications well. She has also been consuming outside food brought in by family. Pt with no BM since 03/24/21; per RN, plan to message MD regarding bowel regimen.   Spoke with pt at bedside, who was pleasant and in good spirits today. She reports consuming 100% of oatmeal and some of her chicken sausage (reports it was too spicy for her). Noted meal completion 50-75%. Pt shares that she has dentures and meats are often difficult for her to chew.   PTA pt consumes 2 meals per day (Breakfast: eggs, sausage/ bacon, and cereal; Snack: beans or bread with peanut butter; Dinner: salad or vegetables). Pt also reports  consuming about one Ensure supplements daily.  Pt reports her UBW is around 170#. She estimates that she has gradually lost about 100# over the past 2 years. Reviewed wt hx; pt has experienced a 7.6% wt loss over the past 6 months. While this is not significant for time frame, it is concerning given pt's advanced age and malnutrition.  Discussed importance of good meal and supplement intake to promote healing. Pt amenable to Ensure supplements.   Noted pt with moderate edema, which may be masking further weight loss and fat/ muscle depletions.   Medications reviewed and include vitamin C and lasix.   Labs reviewed.   NUTRITION - FOCUSED PHYSICAL EXAM:  Flowsheet Row Most Recent Value  Orbital Region Mild depletion  Upper Arm Region Moderate depletion  Thoracic and Lumbar Region No depletion  Buccal Region Mild depletion  Temple Region Mild depletion  Clavicle Bone Region Severe depletion  Clavicle and Acromion Bone Region Severe depletion  Scapular Bone Region Severe depletion  Dorsal Hand No depletion  Patellar Region No depletion  Anterior Thigh Region No depletion  Posterior Calf Region No depletion  Edema (RD Assessment) Moderate  Hair Reviewed  Eyes Reviewed  Mouth Reviewed  Skin Reviewed  Nails Reviewed       Diet Order:   Diet Order            Diet Heart Room service appropriate? Yes; Fluid consistency: Thin  Diet effective now                 EDUCATION NEEDS:   Education needs have been addressed  Skin:  Skin Assessment: Reviewed RN Assessment  Last BM:  03/24/21  Height:   Ht Readings from Last 1 Encounters:  03/27/21 '5\' 4"'$  (1.626 m)    Weight:   Wt Readings from Last 1 Encounters:  03/29/21 54.6 kg    Ideal Body Weight:  54.5 kg  BMI:  Body mass index is 20.66 kg/m.  Estimated Nutritional Needs:   Kcal:  1700-1900  Protein:  85-100 grams  Fluid:  > 1.7 L    Loistine Chance, RD, LDN, [place] Registered Dietitian II Certified Diabetes  Care and Education Specialist Please refer to [facility] for RD and/or RD on-call/weekend/after hours pager

## 2021-03-29 NOTE — Evaluation (Signed)
Physical Therapy Evaluation Patient Details Name: Holly [name] MRN: [id number] DOB: 1941/05/08 Today's Date: 03/29/2021   History of Present Illness  Pt is an 80 y/o female admitted 5/23 secondary to abnormal labs and AKI. 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 PMH includes a fib, CHF, s/p pacemaker, HTN, and CKD.  Clinical Impression  Pt admitted secondary to problem above with deficits below. Pt with weakness and poor balance. Required mod A to stand and min A to take side steps at EOB. Pt fatiguing easily, so further mobility deferred. Feel pt would benefit from SNF level therapies, however, pt reports she prefers to go home with 24/7 support from family. Recommend DME below, and max HH services. May also require PTAR transport home, as pt will likely have trouble performing stair navigation. Will continue to follow acutely.     Follow Up Recommendations Home health PT;Supervision/Assistance - 24 hour (Refusing SNF; will need max HH (HHPT/OT/aide))    Equipment Recommendations  Wheelchair (measurements PT);Wheelchair cushion (measurements PT);3in1 (PT);Rolling walker with 5" wheels    Recommendations for Other Services       Precautions / Restrictions Precautions Precautions: Fall Restrictions Weight Bearing Restrictions: No      Mobility  Bed Mobility Overal bed mobility: Needs Assistance Bed Mobility: Supine to Sit;Sit to Supine     Supine to sit: Mod assist Sit to supine: Mod assist   General bed mobility comments: Mod A for LE and trunk assist. Required assist to scoot hips to EOB as well.    Transfers Overall transfer level: Needs assistance Equipment used: Rolling walker (2 wheeled) Transfers: Sit to/from Stand Sit to Stand: Mod assist         General transfer comment: Mod A for lift assist and steadying to stand.  Ambulation/Gait Ambulation/Gait assistance: Min assist   Assistive device: Rolling walker (2 wheeled)       General Gait Details: Took side  steps at EOB. Pt fatiguing very easily. Leaning on bed for support.  Stairs            Wheelchair Mobility    Modified Rankin (Stroke Patients Only)       Balance Overall balance assessment: Needs assistance Sitting-balance support: No upper extremity supported;Feet supported Sitting balance-Leahy Scale: Fair     Standing balance support: Bilateral upper extremity supported;During functional activity Standing balance-Leahy Scale: Poor Standing balance comment: Reliant on BUE support and external support                             Pertinent Vitals/Pain Pain Assessment: No/denies pain    Home Living Family/patient expects to be discharged to:: Private residence Living Arrangements: Children Available Help at Discharge: Family;Available 24 hours/day Type of Home: House Home Access: Stairs to enter Entrance Stairs-Rails: Right Entrance Stairs-Number of Steps: 3 Home Layout: One level Home Equipment: Walker - 4 wheels;Cane - single point;Shower seat      Prior Function Level of Independence: Needs assistance   Gait / Transfers Assistance Needed: Uses RW and cane for mobility  ADL's / Homemaking Assistance Needed: FAmily assists with ADLs as needed        Hand Dominance        Extremity/Trunk Assessment   Upper Extremity Assessment Upper Extremity Assessment: Defer to OT evaluation    Lower Extremity Assessment Lower Extremity Assessment: Generalized weakness    Cervical / Trunk Assessment Cervical / Trunk Assessment: Kyphotic  Communication   Communication: No difficulties  Cognition  Arousal/Alertness: Awake/alert Behavior During Therapy: WFL for tasks assessed/performed Overall Cognitive Status: Within Functional Limits for tasks assessed                                 General Comments: Likely close to baseline. WFL for basic tasks. A and O X4      General Comments General comments (skin integrity, edema, etc.):  Pt's daughter present during session    Exercises     Assessment/Plan    PT Assessment Patient needs continued PT services  PT Problem List Decreased strength;Decreased activity tolerance;Decreased balance;Decreased mobility;Decreased knowledge of use of DME;Decreased knowledge of precautions;Cardiopulmonary status limiting activity       PT Treatment Interventions DME instruction;Gait training;Functional mobility training;Therapeutic activities;Therapeutic exercise;Balance training;Patient/family education    PT Goals (Current goals can be found in the Care Plan section)  Acute Rehab PT Goals Patient Stated Goal: for pt to return home PT Goal Formulation: With patient Time For Goal Achievement: 04/12/21 Potential to Achieve Goals: Fair    Frequency Min 3X/week   Barriers to discharge        Co-evaluation               AM-PAC PT "6 Clicks" Mobility  Outcome Measure Help needed turning from your back to your side while in a flat bed without using bedrails?: A Little Help needed moving from lying on your back to sitting on the side of a flat bed without using bedrails?: A Lot Help needed moving to and from a bed to a chair (including a wheelchair)?: A Lot Help needed standing up from a chair using your arms (e.g., wheelchair or bedside chair)?: A Lot Help needed to walk in hospital room?: A Lot Help needed climbing 3-5 steps with a railing? : A Lot 6 Click Score: 13    End of Session Equipment Utilized During Treatment: Gait belt Activity Tolerance: Patient limited by fatigue Patient left: in bed;with bed alarm set;with call bell/phone within reach Nurse Communication: Mobility status PT Visit Diagnosis: Unsteadiness on feet (R26.81);Muscle weakness (generalized) (M62.81);Difficulty in walking, not elsewhere classified (R26.2)    Time: [id number] PT Time Calculation (min) (ACUTE ONLY): 28 min   Charges:   PT Evaluation $PT Eval Moderate Complexity: 1 Mod PT  Treatments $Therapeutic Activity: 8-22 mins        Lou Miner, DPT  Acute Rehabilitation Services  Pager: [phone] Office: [phone]   Rudean Hitt 03/29/2021, 5:06 PM

## 2021-03-30 ENCOUNTER — Inpatient Hospital Stay: Payer: Self-pay

## 2021-03-30 ENCOUNTER — Inpatient Hospital Stay (HOSPITAL_COMMUNITY): Payer: Medicare Other

## 2021-03-30 DIAGNOSIS — N179 Acute kidney failure, unspecified: Secondary | ICD-10-CM | POA: Diagnosis not present

## 2021-03-30 DIAGNOSIS — I5084 End stage heart failure: Secondary | ICD-10-CM

## 2021-03-30 DIAGNOSIS — Z515 Encounter for palliative care: Secondary | ICD-10-CM | POA: Diagnosis not present

## 2021-03-30 DIAGNOSIS — R531 Weakness: Secondary | ICD-10-CM

## 2021-03-30 DIAGNOSIS — I5021 Acute systolic (congestive) heart failure: Secondary | ICD-10-CM | POA: Diagnosis not present

## 2021-03-30 DIAGNOSIS — I5023 Acute on chronic systolic (congestive) heart failure: Secondary | ICD-10-CM

## 2021-03-30 LAB — COMPREHENSIVE METABOLIC PANEL
ALT: 28 U/L (ref 0–44)
AST: 28 U/L (ref 15–41)
Albumin: 3 g/dL — ABNORMAL LOW (ref 3.5–5.0)
Alkaline Phosphatase: 40 U/L (ref 38–126)
Anion gap: 11 (ref 5–15)
BUN: 54 mg/dL — ABNORMAL HIGH (ref 8–23)
CO2: 23 mmol/L (ref 22–32)
Calcium: 10.7 mg/dL — ABNORMAL HIGH (ref 8.9–10.3)
Chloride: 101 mmol/L (ref 98–111)
Creatinine, Ser: 1.78 mg/dL — ABNORMAL HIGH (ref 0.44–1.00)
GFR, Estimated: 29 mL/min — ABNORMAL LOW (ref >60)
Glucose, Bld: 85 mg/dL (ref 70–99)
Potassium: 3.4 mmol/L — ABNORMAL LOW (ref 3.5–5.1)
Sodium: 135 mmol/L (ref 135–145)
Total Bilirubin: 1.3 mg/dL — ABNORMAL HIGH (ref 0.3–1.2)
Total Protein: 6.9 g/dL (ref 6.5–8.1)

## 2021-03-30 LAB — COOXEMETRY PANEL
Carboxyhemoglobin: 1.1 % (ref 0.5–1.5)
Methemoglobin: 0.7 % (ref 0.0–1.5)
O2 Saturation: 52 %
Total hemoglobin: 12.2 g/dL (ref 12.0–16.0)

## 2021-03-30 LAB — BRAIN NATRIURETIC PEPTIDE: B Natriuretic Peptide: >4500 pg/mL — ABNORMAL HIGH (ref 0.0–100.0)

## 2021-03-30 LAB — LACTIC ACID, PLASMA
Lactic Acid, Venous: 1.2 mmol/L (ref 0.5–1.9)
Lactic Acid, Venous: 1.3 mmol/L (ref 0.5–1.9)

## 2021-03-30 MED ORDER — DOBUTAMINE IN D5W 4-5 MG/ML-% IV SOLN
2.5000 ug/kg/min | INTRAVENOUS | Status: DC
  Administered 2021-03-30 – 2021-04-04 (×2): 2.5 ug/kg/min via INTRAVENOUS
  Filled 2021-03-30 (×2): qty 250

## 2021-03-30 MED ORDER — SODIUM CHLORIDE 0.9% FLUSH: 10.0000 mL | INTRAVENOUS | Status: DC | PRN

## 2021-03-30 MED ORDER — CHLORHEXIDINE GLUCONATE CLOTH 2 % EX PADS
6.0000 | MEDICATED_PAD | Freq: Every day | CUTANEOUS | Status: DC
  Administered 2021-03-30 – 2021-04-08 (×10): 6 via TOPICAL

## 2021-03-30 MED ORDER — SODIUM CHLORIDE 0.9% FLUSH
10.0000 mL | Freq: Two times a day (BID) | INTRAVENOUS | Status: DC
  Administered 2021-03-30: 10 mL
  Administered 2021-03-30: 40 mL
  Administered 2021-03-31 – 2021-04-07 (×12): 10 mL
  Administered 2021-04-07: 3 mL
  Administered 2021-04-08: 10 mL

## 2021-03-30 MED ORDER — DIPHENHYDRAMINE HCL 25 MG PO CAPS
25.0000 mg | ORAL_CAPSULE | Freq: Once | ORAL | Status: AC
  Administered 2021-03-30: 25 mg via ORAL
  Filled 2021-03-30: qty 1

## 2021-03-30 NOTE — Progress Notes (Signed)
PROGRESS NOTE    Holly [name]  [id number] DOB: July 07, 1941 DOA: 03/26/2021 PCP: Merrilee Seashore, MD  Brief Narrative:  80 y.o. female with medical history significant for heart block s/p pacemaker, paroxysmal atrial fibrillation on Eliquis, combined systolic and diastolic CHF, hypertension, CKD 3a, and chronic iron deficiency anemia, hyperlipidemia, anxiety who presents at the request of her PCP for abnormal labs. She complains of increasing shortness of breath both at rest and with exertion for the past 2 months.  For the past month she has also noticed upper abdominal pain every time that she eats.  Has had decreased p.o. intake because of this pain and because of lack of appetite. CT of the abdomen obtained shows contracted gallbladder with cholelithiasis but no cholecystitis.   There is also notable anasarca of the abdominal wall and edema of the mesenteric lymphatics.  Patient was recently seen by her cardiologist and then was seen by her PCP on 5/20 and found to have volume depletion and a creatinine of 2.1  On arrival in the ED BNP >4000, creatinine 2.3  Started on diuretics   Assessment & Plan:   Principal Problem:   AKI (acute kidney injury) ([place]) Active Problems:   Essential hypertension   DNR (do not resuscitate) discussion   S/P placement of cardiac pacemaker   Acute systolic heart failure (HCC)   Paroxysmal A-fib (HCC)   Cholelithiasis   Congestive heart failure (HCC)   Malnutrition of moderate degree   Weakness generalized   Palliative care by specialist   Acute on chronic Combined systolic and diastolic CHF 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 inotropes etc. etc. PICC line placed 5/27-advanced heart failure team also to see -palliative care has seen the patient, family wishes to continue medical  management for now  AKI on CKD stage IIIa Cardiorenal syndrome -Defer to cardiology management in addition to diuretics  Cholelithiasis without cholecystitis - Postprandial fullness/pain over the past few weeks  - Hold off on further imaging, CT abdomen pelvis shows cholelithiasis without cholecystitis as well as diverticulosis without diverticulitis -Continue heart healthy diet  Paroxysmal atrial fibrillation - Continue Eliquis 2.5 twice daily -Rate currently well controlled on Toprol-XL 50  Left upper pole kidney cyst, incidental finding - Cysts suspected to be benign hemorrhagic or proteinaceous cyst -  Renal ultrasound relatively benign--no further w/u  History of heart block s/p pacemaker - Stable  Hypertension  -hold Losartan due to AKI -Metoprolol DC by cardiology  Hyperlipidemia -continue statin  DVT prophylaxis: Eliquis Code Status: Full Family Communication: Discussed with daughter at bedside 5/26 no family today  Status is: Inpatient  Dispo: The patient is from: Home              Anticipated d/c is to: Awaiting PT eval appreciate them seeing patient today              Anticipated d/c date is: Probably within the next 48 hours              Patient currently not medically stable for discharge  Consultants:   None  Procedures:   None indicated  Antimicrobials:  None indicated  Subjective:  Frail Not really eating Not thirsty Does not seem overloaded  Objective: Vitals:   03/30/21 0617 03/30/21 0856 03/30/21 0900 03/30/21 1240  BP: (!) '98/57 99/62 92/63 '$ 107/61  Pulse:  (!) 52 (!) 58 Marland Kitchen)  59  Resp:      Temp:  (!) 97.5 F (36.4 C)    TempSrc:  Oral    SpO2:  100% 98% 100%  Weight: 53.1 kg     Height:        Intake/Output Summary (Last 24 hours) at 03/30/2021 1341 Last data filed at 03/30/2021 [id number] Gross per 24 hour  Intake 480 ml  Output 1675 ml  Net -1195 ml   Filed Weights   03/29/21 0118 03/30/21 0244 03/30/21 0617  Weight:  54.6 kg 53.6 kg 53.1 kg    Examination:  No significant changes from yesterday  edentulous frail appearing black female cannot appreciate JVD S1-S2 no murmur-on monitors does have a paced rhythm Chest clear posterolaterally Abdomen soft No lower extremity edema No rash Neurologically power is grossly 5/5 sensory deferred   Data Reviewed: I have personally reviewed following labs and imaging studies  CBC: Recent Labs  Lab 03/26/21 1523  WBC 4.3  NEUTROABS 2.5  HGB 11.1*  HCT 34.0*  MCV 85.0  PLT [id number]   Basic Metabolic Panel: Recent Labs  Lab 03/26/21 1523 03/27/21 0445 03/28/21 0857 03/30/21 0738  NA 134* 135 135 135  K 4.2 4.1 3.6 3.4*  CL 105 107 104 101  CO2 17* 17* 19* 23  GLUCOSE 89 95 106* 85  BUN 75* 75* 73* 54*  CREATININE 2.14* 2.23* 2.26* 1.78*  CALCIUM 11.0* 10.8* 10.7* 10.7*   GFR: Estimated Creatinine Clearance: 21.1 mL/min (A) (by C-G formula based on SCr of 1.78 mg/dL (H)). Liver Function Tests: Recent Labs  Lab 03/26/21 1523 03/28/21 0857 03/30/21 0738  AST 28 38 28  ALT 26 34 28  ALKPHOS 46 45 40  BILITOT 1.8* 1.7* 1.3*  PROT 7.6 7.0 6.9  ALBUMIN 3.5 3.1* 3.0*   Recent Labs  Lab 03/26/21 1523  LIPASE 46   No results for input(s): AMMONIA in the last 168 hours. Coagulation Profile: No results for input(s): INR, PROTIME in the last 168 hours. Cardiac Enzymes: No results for input(s): CKTOTAL, CKMB, CKMBINDEX, TROPONINI in the last 168 hours. BNP (last 3 results) Recent Labs    07/20/20 1201  PROBNP 9,907*   HbA1C: No results for input(s): HGBA1C in the last 72 hours. CBG: No results for input(s): GLUCAP in the last 168 hours. Lipid Profile: No results for input(s): CHOL, HDL, LDLCALC, TRIG, CHOLHDL, LDLDIRECT in the last 72 hours. Thyroid Function Tests: No results for input(s): TSH, T4TOTAL, FREET4, T3FREE, THYROIDAB in the last 72 hours. Anemia Panel: No results for input(s): VITAMINB12, FOLATE, FERRITIN, TIBC, IRON,  RETICCTPCT in the last 72 hours. Sepsis Labs: Recent Labs  Lab 03/30/21 0322 03/30/21 0738  LATICACIDVEN 1.2 1.3    Recent Results (from the past 240 hour(s))  Resp Panel by RT-PCR (Flu A&B, Covid) Nasopharyngeal Swab     Status: None   Collection Time: 03/26/21 11:43 PM   Specimen: Nasopharyngeal Swab; Nasopharyngeal(NP) swabs in vial transport medium  Result Value Ref Range Status   SARS Coronavirus 2 by RT PCR NEGATIVE NEGATIVE Final    Comment: (NOTE) SARS-CoV-2 target nucleic acids are NOT DETECTED.  The SARS-CoV-2 RNA is generally detectable in upper respiratory specimens during the acute phase of infection. The lowest concentration of SARS-CoV-2 viral copies this assay can detect is 138 copies/mL. A negative result does not preclude SARS-Cov-2 infection and should not be used as the sole basis for treatment or other patient management decisions. A negative result may occur with  improper specimen  collection/handling, submission of specimen other than nasopharyngeal swab, presence of viral mutation(s) within the areas targeted by this assay, and inadequate number of viral copies(<138 copies/mL). A negative result must be combined with clinical observations, patient history, and epidemiological information. The expected result is Negative.  Fact Sheet for Patients:  EntrepreneurPulse.com.au  Fact Sheet for Healthcare Providers:  IncredibleEmployment.be  This test is no t yet approved or cleared by the [place] FDA and  has been authorized for detection and/or diagnosis of SARS-CoV-2 by FDA under an Emergency Use Authorization (EUA). This EUA will remain  in effect (meaning this test can be used) for the duration of the COVID-19 declaration under Section 564(b)(1) of the Act, 21 U.S.C.section 360bbb-3(b)(1), unless the authorization is terminated  or revoked sooner.       Influenza A by PCR NEGATIVE NEGATIVE Final   Influenza  B by PCR NEGATIVE NEGATIVE Final    Comment: (NOTE) The Xpert Xpress SARS-CoV-2/FLU/RSV plus assay is intended as an aid in the diagnosis of influenza from Nasopharyngeal swab specimens and should not be used as a sole basis for treatment. Nasal washings and aspirates are unacceptable for Xpert Xpress SARS-CoV-2/FLU/RSV testing.  Fact Sheet for Patients: EntrepreneurPulse.com.au  Fact Sheet for Healthcare Providers: IncredibleEmployment.be  This test is not yet approved or cleared by the [place] FDA and has been authorized for detection and/or diagnosis of SARS-CoV-2 by FDA under an Emergency Use Authorization (EUA). This EUA will remain in effect (meaning this test can be used) for the duration of the COVID-19 declaration under Section 564(b)(1) of the Act, 21 U.S.C. section 360bbb-3(b)(1), unless the authorization is terminated or revoked.  Performed at [facility], [place] [address] [place], [place] [address]          Radiology Studies: DG Chest 1 View  Result Date: 03/30/2021 CLINICAL DATA:  Heart failure.  Cardiac pacemaker. EXAM: CHEST  1 VIEW COMPARISON:  04/24/2020 FINDINGS: Enlargement of the cardiac silhouette. There is a left-sided dual chamber cardiac pacemaker. S-shaped scoliosis in the thoracic spine. Linear density in the right lower chest is most compatible with a Mach line from overlying soft tissues. Lungs are clear without pulmonary edema. Severe degenerative changes at the right shoulder. Abnormal appearance of the left humeral head appears chronic. Atherosclerotic calcifications at the aortic arch. IMPRESSION: 1. Cardiomegaly without focal lung disease or overt pulmonary edema. 2. Stable appearance of the cardiac pacemaker. 3. Scoliosis. 4. Chronic changes in both shoulders. Electronically Signed   By: Markus Daft M.D.   On: 03/30/2021 10:34   [place] EKG SITE RITE  Result Date: 03/30/2021 If Site Rite image not  attached, placement could not be confirmed due to current cardiac rhythm.  Scheduled Meds: . apixaban  2.5 mg Oral BID  . vitamin C  1,000 mg Oral Daily  . Chlorhexidine Gluconate Cloth  6 each Topical Daily  . feeding supplement  237 mL Oral BID BM  . furosemide  60 mg Intravenous Daily  . multivitamin with minerals  1 tablet Oral Daily  . polyethylene glycol  17 g Oral Daily  . rosuvastatin  10 mg Oral QPM  . senna-docusate  1 tablet Oral QHS  . sodium chloride flush  10-40 mL Intracatheter Q12H   Continuous Infusions:   LOS: 3 days   Time spent: [place], MD [facility] 1:41 PM   If 7PM-7AM, please contact night-coverage [email or web address]  03/30/2021, 1:41 PM

## 2021-03-30 NOTE — Progress Notes (Addendum)
Subjective:  Feels tired Says her mouth is dry Complains of itching all over Breathing is better  1675 cc output/24 hrs  Objective:  Vital Signs in the last 24 hours: Temp:  [97.3 F (36.3 C)-97.9 F (36.6 C)] 97.3 F (36.3 C) (05/27 0550) Pulse Rate:  [59-60] 59 (05/27 0550) Resp:  [16-18] 18 (05/27 0550) BP: (89-111)/(56-69) 98/57 (05/27 0617) SpO2:  [100 %] 100 % (05/27 0550) Weight:  [53.1 kg-53.6 kg] 53.1 kg (05/27 0617)  Intake/Output from previous day: 05/26 0701 - 05/27 0700 In: 56 [P.O.:580] Out: [place] [Urine:1675]  Physical Exam Vitals and nursing note reviewed.  Constitutional:      General: She is not in acute distress.    Appearance: She is well-developed. She is cachectic.  HENT:     Head: Normocephalic and atraumatic.     Mouth/Throat:     Comments: Dry mucosa Eyes:     Conjunctiva/sclera: Conjunctivae normal.     Pupils: Pupils are equal, round, and reactive to light.  Neck:     Vascular: JVD present.  Cardiovascular:     Rate and Rhythm: Normal rate and regular rhythm.     Pulses: Normal pulses and intact distal pulses.     Heart sounds: No murmur heard.   Pulmonary:     Effort: Pulmonary effort is normal.     Breath sounds: Examination of the right-middle field reveals rales. Examination of the right-lower field reveals rales. Rales present. No wheezing.  Abdominal:     General: Bowel sounds are normal.     Palpations: Abdomen is soft.     Tenderness: There is no rebound.  Musculoskeletal:        General: No tenderness. Normal range of motion.     Right lower leg: No edema.     Left lower leg: No edema.  Lymphadenopathy:     Cervical: No cervical adenopathy.  Skin:    General: Skin is warm and dry.  Neurological:     Mental Status: She is alert and oriented to person, place, and time.     Cranial Nerves: No cranial nerve deficit.      Lab Results: BMP Recent Labs    05/19/20 1114 06/05/20 1311 08/25/20 1601 03/26/21 1523  03/27/21 0445 03/28/21 0857  NA 130* 131* 132* 134* 135 135  K 4.7 4.5 4.8 4.2 4.1 3.6  CL 95* 97 98 105 107 104  CO2 17* 21 20 17* 17* 19*  GLUCOSE 99 79 109* 89 95 106*  BUN 18 25 37* 75* 75* 73*  CREATININE 1.21* 1.29* 1.36* 2.14* 2.23* 2.26*  CALCIUM 12.1* 11.9* 10.4* 11.0* 10.8* 10.7*  GFRNONAA 42* 39* 37* 23* 22* 21*  GFRAA 49* 45* 42*  --   --   --     CBC Recent Labs  Lab 03/26/21 1523  WBC 4.3  RBC 4.00  HGB 11.1*  HCT 34.0*  PLT 223  MCV 85.0  MCH 27.8  MCHC 32.6  RDW 15.9*  LYMPHSABS 0.8  MONOABS 0.7  EOSABS 0.2  BASOSABS 0.1     BNP (last 3 results) Recent Labs    04/26/20 1608 06/05/20 1311 03/26/21 1523  BNP 2,647.7* 1,854.5* >4,500.0*    Lipid Panel     Component Value Date/Time   CHOL 145 11/01/2019 1126   TRIG 49 11/01/2019 1126   HDL 76 11/01/2019 1126   CHOLHDL 1.9 11/01/2019 1126   LDLCALC 58 11/01/2019 1126     Hepatic Function Panel Recent Labs  03/26/21 1523 03/28/21 0857  PROT 7.6 7.0  ALBUMIN 3.5 3.1*  AST 28 38  ALT 26 34  ALKPHOS 46 45  BILITOT 1.8* 1.7*    CARDIAC STUDIES:  EKG 03/27/2021: AV dual paced rhythm  1 Brief NSVT episode on pacemaker interrogation on 02/14/2021 No recent alert  Echocardiogram 03/27/2021: 1. Left ventricular ejection fraction, by estimation, is <20%. The left  ventricle has severely decreased function. The left ventricle demonstrates  global hypokinesis. There is mild left ventricular hypertrophy. Left  ventricular diastolic parameters are  indeterminate.  2. Right ventricular systolic function is normal. The right ventricular  size is normal.  3. Left atrial size was severely dilated.  4. Right atrial size was severely dilated.  5. The mitral valve is normal in structure. Severe mitral valve  regurgitation. No evidence of mitral stenosis.  6. Tricuspid valve regurgitation is severe.  7. The aortic valve is normal in structure. There is mild calcification  of the  aortic valve. There is mild thickening of the aortic valve. Aortic  valve regurgitation is trivial. Mild to moderate aortic valve  sclerosis/calcification is present, without  any evidence of aortic stenosis.  8. The inferior vena cava is dilated in size with <50% respiratory  variability, suggesting right atrial pressure of 15 mmHg.   Assessment & Recommendations:  80 year old [place] American female with hypertension, dual-chamber pacemaker placement for AV block, moderate, medically managed coronary artery disease (LCx), hypertension, NSVT, HFrEF  Acute on chronic systolic heart failure: Acute decompensation. She has cardiac cachexia, with rales on exam. No other overt edema. LVEF <20%. BNP >4500 Conitnue IV lasix 60 mg daily. Strict I/O, daily weights.  Hold Spironolactone and metoprolol in the setting of acute decompensation and AKI Patient has had 100% RV pacing, likely cause of her systolic heart failure, along with possibility of ischemic cardiomyopathy. She has declined invasive workup and management, including coronary angiography, possible CRT. At this point, I think it is too late in her disease course for any invasive workup or management. She is mildly hypotensive without obvious shock (Lactic acid 1.2) Today, she has dry mucosa, in spite of rales on exam.  I am unsure how much more diuresis she can tolerate without impacting her hemodynamics. I discussed with advanced heart failure specialist Dr. Haroldine Laws re: possibility of at least short term inotropic support. 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 Appreciate their input.  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 Diuresis will help relieve gut edema.  Appears more energetic today than on day 1 of admission. Appreciate nutrition consuilt  AKI: Cardiorenal syndrome. Continue diuresis. I ancipitate her Cr will improve with decongestion of kidneys.   PAF: Continue eliquis 2.5 mg  bid  CAD: Moderate nonobstructive disease (Cath 2019) No angina symptoms at this time.   CRITICAL CARE Performed by: [name]   Total critical care time: 30 minutes   Critical care time was exclusive of separately billable procedures and treating other patients.   Critical care was necessary to treat or prevent imminent or life-threatening deterioration.   Critical care was time spent personally by me on the following activities: development of treatment plan with patient and/or surrogate as well as nursing, discussions with consultants, evaluation of patient's response to treatment, examination of patient, obtaining history from patient or surrogate, ordering and performing treatments and interventions, ordering and review of laboratory studies, ordering and review of radiographic studies, pulse oximetry and re-evaluation of patient's condition.      Holly [name]  Esther Hardy, MD Pager: [phone] Office: [phone]

## 2021-03-30 NOTE — Evaluation (Signed)
Occupational Therapy Evaluation Patient Details Name: Holly [name] MRN: [id number] DOB: 25-Jun-1941 Today's Date: 03/30/2021    History of Present Illness Pt is an 80 y/o female admitted 5/23 secondary to abnormal labs and AKI. 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 PMH includes a fib, CHF, s/p pacemaker, HTN, and CKD.   Clinical Impression   Patient admitted for the above diagnosis.  PTA she states she lives alone, family check in on her throughout the day.  Patient states she continues to drive, as late as one week ago to church; takes showers by herself at time, as family is not always available; and cares for her own meds.  Patient states family will assist with meals and community mobility if she asks them to.  Unclear how accurate this is, as PT describes a different setup.  Barriers are listed below.  Currently, she is needing up to Mod A for bed mobility, Min A for transfers, and up to Mod A for lower body ADL. Home health is being recommended, but increasing assist at home may be beneficial.  Depending on progress, SNF may be needed for short term rehab.  Pill Box test may be needed to ensure accuracy of med management.      Follow Up Recommendations  Home health OT    Equipment Recommendations  None recommended by OT    Recommendations for Other Services       Precautions / Restrictions Precautions Precautions: Fall Restrictions Weight Bearing Restrictions: No      Mobility Bed Mobility Overal bed mobility: Needs Assistance Bed Mobility: Supine to Sit;Sit to Supine     Supine to sit: Mod assist;HOB elevated Sit to supine: Mod assist;HOB elevated   General bed mobility comments: Sitting on BSC upon PT arrival. Patient Response: Cooperative;Flat affect  Transfers Overall transfer level: Needs assistance Equipment used: Rolling walker (2 wheeled) Transfers: Sit to/from [facility] Sit to Stand: Min assist Stand pivot transfers: Min assist        General transfer comment: Assist to power to standing from [facility] x1, from EOB x2, transferred to chair post ambulation. pt with flexed trunk and flexed at hips/knees. Does not have special shoes here.    Balance Overall balance assessment: Needs assistance Sitting-balance support: No upper extremity supported;Feet supported Sitting balance-Leahy Scale: Fair Sitting balance - Comments: kyphotic posture   Standing balance support: During functional activity Standing balance-Leahy Scale: Poor Standing balance comment: Reliant on BUE support with forearms leaning on walker handles due to inability to stand upright secondary to back pain.                           ADL either performed or assessed with clinical judgement   ADL Overall ADL's : Needs assistance/impaired                 Upper Body Dressing : Minimal assistance;Sitting   Lower Body Dressing: Moderate assistance;Sit to/from stand   Toilet Transfer: Moderate assistance;RW Toilet Transfer Details (indicate cue type and reason): transfer to [facility] for simulation         Functional mobility during ADLs: Rolling walker;Minimal assistance       Vision Baseline Vision/History: No visual deficits Patient Visual Report: No change from baseline       Perception     Praxis      Pertinent Vitals/Pain Pain Assessment: Faces Pain Score: 3  Faces Pain Scale: Hurts a little bit Pain Location: L shoulder Pain  Descriptors / Indicators: Aching Pain Intervention(s): Monitored during session     Hand Dominance Right   Extremity/Trunk Assessment Upper Extremity Assessment Upper Extremity Assessment: Generalized weakness;LUE deficits/detail LUE Deficits / Details: decreased end range L shoulder flexion.   Lower Extremity Assessment Lower Extremity Assessment: Defer to PT evaluation   Cervical / Trunk Assessment Cervical / Trunk Assessment: Kyphotic   Communication Communication Communication: No  difficulties   Cognition Arousal/Alertness: Awake/alert Behavior During Therapy: WFL for tasks assessed/performed Overall Cognitive Status: No family/caregiver present to determine baseline cognitive functioning                                                     Home Living Family/patient expects to be discharged to:: Private residence Living Arrangements: Children Available Help at Discharge: Family;Available PRN/intermittently Type of Home: House Home Access: Stairs to enter [employer] of Steps: 3 Entrance Stairs-Rails: Right Home Layout: One level     Bathroom Shower/Tub: Occupational psychologist: Handicapped height     Home Equipment: Environmental consultant - 4 wheels;Cane - single point;Shower seat          Prior Functioning/Environment Level of Independence: Needs assistance  Gait / Transfers Assistance Needed: Uses RW and cane for mobility ADL's / Homemaking Assistance Needed: FAmily assists with ADLs as needed.  Patient states she needs more help in the morning with showers.            OT Problem List: Decreased strength;Decreased range of motion;Decreased activity tolerance;Impaired balance (sitting and/or standing);Pain      OT Treatment/Interventions: Self-care/ADL training;Therapeutic exercise;Therapeutic activities;Cognitive remediation/compensation;Balance training    OT Goals(Current goals can be found in the care plan section) Acute Rehab OT Goals Patient Stated Goal: Neeed more help at home OT Goal Formulation: With patient Time For Goal Achievement: 04/13/21 Potential to Achieve Goals: Fair ADL Goals Pt Will Perform Grooming: with set-up;sitting;standing Pt Will Perform Upper Body Bathing: with set-up;sitting Pt Will Perform Lower Body Bathing: with supervision;sit to/from stand Pt Will Perform Upper Body Dressing: with set-up;sitting Pt Will Perform Lower Body Dressing: with supervision;sit to/from stand Pt Will  Transfer to Toilet: with supervision;ambulating;regular height toilet Pt Will Perform Toileting - Clothing Manipulation and hygiene: with supervision;sit to/from stand  OT Frequency: Min 2X/week   Barriers to D/C:    none noted       Co-evaluation              AM-PAC OT "6 Clicks" Daily Activity     Outcome Measure Help from another person eating meals?: None Help from another person taking care of personal grooming?: A Little Help from another person toileting, which includes using toliet, bedpan, or urinal?: A Little Help from another person bathing (including washing, rinsing, drying)?: A Lot Help from another person to put on and taking off regular upper body clothing?: A Little Help from another person to put on and taking off regular lower body clothing?: A Lot 6 Click Score: 17   End of Session Equipment Utilized During Treatment: Rolling walker Nurse Communication: Mobility status  Activity Tolerance: Patient limited by fatigue Patient left: in bed;with call bell/phone within reach;with bed alarm set  OT Visit Diagnosis: Unsteadiness on feet (R26.81);Pain;Muscle weakness (generalized) (M62.81) Pain - Right/Left: Left Pain - part of body: Shoulder  Time: 1411-1430 OT Time Calculation (min): 19 min Charges:  OT General Charges $OT Visit: 1 Visit OT Evaluation $OT Eval Moderate Complexity: 1 Mod  03/30/2021  Rich, OTR/L  Acute Rehabilitation Services  Office:  [place] 03/30/2021, 2:52 PM

## 2021-03-30 NOTE — Progress Notes (Signed)
    Progress Note from the Palliative Medicine Team at [facility]   Patient Name: Holly [name]        Date: 03/30/2021 DOB: 1941-08-05  Age: 80 y.o. MRN#: [id number] Attending Physician: Nita Sells, MD Primary Care Physician: Merrilee Seashore, MD Admit Date: 03/26/2021   Medical records reviewed. Continues to diurese, mildly hypotensive today. PICC placed today, heart failure team to see ? Benefit of ionotropes. BNP continues elevated >4500. Cr improved today to 1.78.  This NP visited patient at the bedside as a follow up to  yesterday's [place]. The patient was sleeping, easily aroused. Feels her LE my be staring to swell. Breathing better today. Is just sleepy. Denies any pain or other discomfort today. No questions or concerns.  Called the patient's daughter/HCPOA Sandie Ano. She is happy the patient seems comfortable today. Indicated we would come see the patient on Sunday and then as needed for progress and any new needs. She expressed gratitude.  Discussed with Mateo Flow (daughter/HCPOA) the importance of continued conversation with family and their  medical providers regarding overall plan of care and treatment options, ensuring decisions are within the context of the patients values and GOCs.  Questions and concerns addressed.  Total time spent on the unit was 15 minutes  Greater than 50% of the time was spent in counseling and coordination of care  Walden Field, NP  Florentina Jenny, [place] Palliative Medicine Team Team Phone # [phone] Pager [phone]

## 2021-03-30 NOTE — Consult Note (Addendum)
Advanced Heart Failure Team Consult Note   Primary Physician: Merrilee Seashore, MD PCP-Cardiologist:  Dr. Virgina Jock   Reason for Consultation: Acute on chronic systolic heart failure/ low output   HPI:    Holly [name] is seen today for evaluation of acute on chronic systolic heart failure/ low output at the request of Dr. Virgina Jock, Cardiology.   80 y/o AAF w/ h/o chronic systolic heart failure, moderate CAD involving the LCx managed medically, CHB w/ dual chamber PPM and HTN. Recently found to have high burden RV pacing (100%) w/ drop in LVEF and referred back to EP for upgrade to CRT-P but declined. Now admitted w/ a/c systolic heart failure w/ low output due to RV pacing CM. LVEF now < 20%. RV ok. Severe MR noted on echo.   Pt reports to me that she has had no significant dyspnea, orthopnea, PND nor LEE. She has mild fatigue w/ exertion but no other symptoms. Lives home alone and independent w/ ADLs. Still drives. She reports she saw her PCP the day of admit for routine f/u and advised to go to the ED for abnormal labs and concern for dehydration. SCr was 2.4 on admit. Prior SCr in Oct 2021 was 1.4.    In ED, BNP >4500. Though CXR did not show any overt edema. She was admitted by [facility] and cardiology consulted. Echo done showing worsening LVEF, now < 20%. W/ elevated SCr + hypotension and low CO2  at 19, Lactic acid 1.3, AHF team consulted for potential low output HF.    Arlyce Harman on hold. Getting IV Lasix, 60 mg daily. 1.7L in UOP yesterday. Labs better today, SCr 2.26>>1.78. CO2 19>>23. SBP remains soft 90s-110s.   On examination, she is laying flat in bed w/o orthopnea/ PND.    Echo 07/15/2017: EF 65-70%, RV normal  Echo 04/04/2020: EF 25-30%, RV normal  Echo 08/12/2020: EF 25-30%, mod-severe MR  Echo 03/27/2021: EF < 20%, global HK. RV normal. Severe MR   Review of Systems: [y] = yes, '[ ]'$  = no   . General: Weight gain '[ ]'$ ; Weight loss '[ ]'$ ; Anorexia '[ ]'$ ; Fatigue [Y ]; Fever '[ ]'$ ;  Chills '[ ]'$ ; Weakness '[ ]'$   . Cardiac: Chest pain/pressure '[ ]'$ ; Resting SOB '[ ]'$ ; Exertional SOB '[ ]'$ ; Orthopnea '[ ]'$ ; Pedal Edema '[ ]'$ ; Palpitations '[ ]'$ ; Syncope '[ ]'$ ; Presyncope '[ ]'$ ; Paroxysmal nocturnal dyspnea'[ ]'$   . Pulmonary: Cough '[ ]'$ ; Wheezing'[ ]'$ ; Hemoptysis'[ ]'$ ; Sputum '[ ]'$ ; Snoring '[ ]'$   . GI: Vomiting'[ ]'$ ; Dysphagia'[ ]'$ ; Melena'[ ]'$ ; Hematochezia '[ ]'$ ; Heartburn'[ ]'$ ; Abdominal pain '[ ]'$ ; Constipation '[ ]'$ ; Diarrhea '[ ]'$ ; BRBPR '[ ]'$   . GU: Hematuria'[ ]'$ ; Dysuria '[ ]'$ ; Nocturia'[ ]'$   . Vascular: Pain in legs with walking '[ ]'$ ; Pain in feet with lying flat '[ ]'$ ; Non-healing sores '[ ]'$ ; Stroke '[ ]'$ ; TIA '[ ]'$ ; Slurred speech '[ ]'$ ;  . Neuro: Headaches'[ ]'$ ; Vertigo'[ ]'$ ; Seizures'[ ]'$ ; Paresthesias'[ ]'$ ;Blurred vision '[ ]'$ ; Diplopia '[ ]'$ ; Vision changes '[ ]'$   . Ortho/Skin: Arthritis [Y ]; Joint pain '[ ]'$ ; Muscle pain '[ ]'$ ; Joint swelling '[ ]'$ ; Back Pain '[ ]'$ ; Rash '[ ]'$   . Psych: Depression'[ ]'$ ; Anxiety'[ ]'$   . Heme: Bleeding problems '[ ]'$ ; Clotting disorders '[ ]'$ ; Anemia '[ ]'$   . Endocrine: Diabetes '[ ]'$ ; Thyroid dysfunction'[ ]'$   Home Medications Prior to Admission medications   Medication Sig Start Date End Date Taking? Authorizing Provider  ALPRAZolam Duanne Moron) 0.25 MG tablet  Take 1 tablet by mouth daily as needed for anxiety.  01/14/19  Yes [provider]  apixaban (ELIQUIS) 2.5 MG TABS tablet Take 1 tablet by mouth in the morning and at bedtime.   Yes [provider]  Ascorbic Acid (VITAMIN C) 1000 MG tablet Take 1,000 mg by mouth daily.   Yes [provider]  clobetasol ointment (TEMOVATE) [id number] % Apply 1 application topically 2 (two) times daily as needed (itching).  08/02/17  Yes [provider]  clotrimazole-betamethasone (LOTRISONE) cream 1 application in the morning and at bedtime. 01/16/17  Yes [provider]  EUCRISA 2 % OINT Apply 1 application topically daily as needed (dry skin).  01/14/19  Yes [provider]  losartan (COZAAR) 25 MG tablet TAKE 1 TABLET BY MOUTH EVERY DAY Patient  taking differently: Take 25 mg by mouth daily. 01/29/21  Yes Patwardhan, Manish J, MD  metoprolol succinate (TOPROL-XL) 50 MG 24 hr tablet Take 1 tablet (50 mg total) by mouth daily. Take with or immediately following a meal. 04/21/20  Yes Patwardhan, Manish J, MD  nitroGLYCERIN (NITROSTAT) 0.4 MG SL tablet Place 1 tablet (0.4 mg total) under the tongue every 5 (five) minutes as needed for chest pain. 09/23/19 09/13/20 Yes Patwardhan, Manish J, MD  OLOPATADINE HCL OP Place 1 drop into both eyes 2 (two) times daily.   Yes [provider]  rosuvastatin (CRESTOR) 10 MG tablet TAKE 1 TABLET BY MOUTH EVERY DAY Patient taking differently: Take 10 mg by mouth every evening. 03/20/21  Yes Patwardhan, Manish J, MD  spironolactone (ALDACTONE) 25 MG tablet Take 1 tablet (25 mg total) by mouth every other day. 01/10/21 04/10/21 Yes Patwardhan, Manish J, MD  torsemide (DEMADEX) 20 MG tablet Daily Patient taking differently: Take 20 mg by mouth daily. 02/02/21  Yes Patwardhan, Manish J, MD  triamcinolone cream (KENALOG) 0.1 % Apply topically 2 (two) times daily as needed. 06/01/20  Yes [provider]    Past Medical History: Past Medical History:  Diagnosis Date  . 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 Bronchitis   . Encounter for care of pacemaker 05/12/2019  . Hypertension   . Pacemaker - MRI safe Medtronic Azure XT DR MRI [id number] Dual chamber PPM in situ 04/24/2019    Past Surgical History: Past Surgical History:  Procedure Laterality Date  . LEFT HEART CATH AND CORONARY ANGIOGRAPHY N/A 11/11/2017   Procedure: LEFT HEART CATH AND CORONARY ANGIOGRAPHY;  Surgeon: Nigel Mormon, MD;  Location: [facility];  Service: Cardiovascular;  Laterality: N/A;  . left leg surgery    . PACEMAKER IMPLANT N/A 07/15/2017   Procedure: Pacemaker Implant;  Surgeon: Constance Haw, MD;  Location: [facility];  Service: Cardiovascular;  Laterality: N/A;    Family History: Family History   Problem Relation Age of Onset  . Hypertension Mother   . ALS Sister   . Heart attack Brother   . Diabetes Daughter     Social History: Social History   Socioeconomic History  . Marital status: Divorced    Spouse name: Not on file  . Number of children: 7  . Years of education: Not on file  . Highest education level: Not on file  Occupational History  . Not on file  Tobacco Use  . Smoking status: Never Smoker  . Smokeless tobacco: Never Used  Vaping Use  . Vaping Use: Never used  Substance and Sexual Activity  . Alcohol use: No  .  Drug use: Never  . Sexual activity: Not on file  Other Topics Concern  . Not on file  Social History Narrative  . Not on file   Social Determinants of Health   Financial Resource Strain: Not on file  Food Insecurity: Not on file  Transportation Needs: Not on file  Physical Activity: Not on file  Stress: Not on file  Social Connections: Not on file    Allergies:  Allergies  Allergen Reactions  . Entresto [Sacubitril-Valsartan] Itching, Rash and Cough  . Lisinopril Cough  . Venlafaxine Rash    Objective:    Vital Signs:   Temp:  [97.3 F (36.3 C)-97.9 F (36.6 C)] 97.3 F (36.3 C) (05/27 0550) Pulse Rate:  [59-60] 59 (05/27 0550) Resp:  [16-18] 18 (05/27 0550) BP: (89-111)/(56-69) 98/57 (05/27 0617) SpO2:  [100 %] 100 % (05/27 0550) Weight:  [53.1 kg-53.6 kg] 53.1 kg (05/27 0617) Last BM Date: 03/24/21  Weight change: Filed Weights   03/29/21 0118 03/30/21 0244 03/30/21 0617  Weight: 54.6 kg 53.6 kg 53.1 kg    Intake/Output:   Intake/Output Summary (Last 24 hours) at 03/30/2021 0853 Last data filed at 03/30/2021 0552 Gross per 24 hour  Intake 580 ml  Output 1675 ml  Net -1095 ml      Physical Exam    General:  Thin/ elderly AAF. No resp difficulty HEENT: normal Neck: supple. Neck veins distended Carotids 2+ bilat; no bruits. No lymphadenopathy or thyromegaly appreciated. Cor: PMI nondisplaced. Regular rate &  rhythm. 2/6 MR murmur  Lungs: clear Abdomen: soft, nontender, nondistended. No hepatosplenomegaly. No bruits or masses. Good bowel sounds. Extremities: no cyanosis, clubbing, rash, edema Neuro: alert & orientedx3, cranial nerves grossly intact. moves all 4 extremities w/o difficulty. Affect pleasant   Telemetry   A-V paced 60s   EKG    NSR 66 bpm   Labs   Basic Metabolic Panel: Recent Labs  Lab 03/26/21 1523 03/27/21 0445 03/28/21 0857  NA 134* 135 135  K 4.2 4.1 3.6  CL 105 107 104  CO2 17* 17* 19*  GLUCOSE 89 95 106*  BUN 75* 75* 73*  CREATININE 2.14* 2.23* 2.26*  CALCIUM 11.0* 10.8* 10.7*    Liver Function Tests: Recent Labs  Lab 03/26/21 1523 03/28/21 0857  AST 28 38  ALT 26 34  ALKPHOS 46 45  BILITOT 1.8* 1.7*  PROT 7.6 7.0  ALBUMIN 3.5 3.1*   Recent Labs  Lab 03/26/21 1523  LIPASE 46   No results for input(s): AMMONIA in the last 168 hours.  CBC: Recent Labs  Lab 03/26/21 1523  WBC 4.3  NEUTROABS 2.5  HGB 11.1*  HCT 34.0*  MCV 85.0  PLT 223    Cardiac Enzymes: No results for input(s): CKTOTAL, CKMB, CKMBINDEX, TROPONINI in the last 168 hours.  BNP: BNP (last 3 results) Recent Labs    04/26/20 1608 06/05/20 1311 03/26/21 1523  BNP 2,647.7* 1,854.5* >4,500.0*    ProBNP (last 3 results) Recent Labs    07/20/20 1201  PROBNP 9,907*     CBG: No results for input(s): GLUCAP in the last 168 hours.  Coagulation Studies: No results for input(s): LABPROT, INR in the last 72 hours.   Imaging    No results found.   Medications:     Current Medications: . apixaban  2.5 mg Oral BID  . vitamin C  1,000 mg Oral Daily  . feeding supplement  237 mL Oral BID BM  . furosemide  60 mg  Intravenous Daily  . multivitamin with minerals  1 tablet Oral Daily  . polyethylene glycol  17 g Oral Daily  . rosuvastatin  10 mg Oral QPM  . senna-docusate  1 tablet Oral QHS     Infusions:     Assessment/Plan   1. Acute on Chronic  Systolic Heart Failure - felt likely RV pacing CM from high RV pacing percentage (100%). Pt previously declined upgrade to CRT-P - Echo 07/15/2017: EF 65-70%, RV normal (PPM implanted 07/2017) - Echo 04/04/2020: EF 25-30%, RV normal  - Echo 08/12/2020: EF 25-30%, mod-severe MR  - Echo 03/27/2021: EF < 20%, global HK. RV normal. Severe MR  - She has mild exertional fatigue w/ ADLs but no significant dyspnea. BNP elevated >4500 but no symptoms of pulmonary congestion. Admit CXR w/o overt edema. Suspect marked elevated in BNP likely from severe MR - initial concern for low output but labs improved today, SCr 2.26>>1.78. CO2 19>>23 - Will plan to place PICC to check co-ox and CVP to guide diuresis and need for inotropic therapy  - Unfortunately may be too late for CRT-P upgrade  - Has severe MR, likely functional. ? Benefit from MitraClip  2. AKI - prior SCr from 10/21 was 1.4 - 2.1>>2.3 this admit, down to 1.78 today  - suspect cardiorenal  - place PICC to check Co-ox   3. Mitral Regurgitation  - severe on Echo, likely function  - ? If any benefit from MitraClip   4. CHB - PPM   5. Hypokalemia - K 3.4 today w/ diuresis  - supp w/ KCl   6. CAD - records indicate mod LCx disease. No recent CP or dyspnea - continue medical management per cardiology    Length of Stay: [phone] Dr[address] Ladoris Gene  03/30/2021, 8:53 AM  Advanced Heart Failure Team Pager [phone] (M-F; 7a - 5p)  Please contact [facility] for night-coverage after hours (4p -7a ) and weekends on [email or web address]  Patient seen and examined with the above-signed Advanced Practice Provider and/or Housestaff. I personally reviewed laboratory data, imaging studies and relevant notes. I independently examined the patient and formulated the important aspects of the plan. I have edited the note to reflect any of my changes or salient points. I have personally discussed the plan with the patient and/or family.  She has end-stage HF  likely due to chronic RV pacing. Now with drop in EF to 20% and severe functional MR. Previously refused CRT upgrade.   PICC placed CVP 7 co-ox 52%  General:  Elderly frail appearing. No resp difficulty HEENT: normal Neck: supple. JVP 7 . Carotids 2+ bilat; no bruits. No lymphadenopathy or thryomegaly appreciated. Cor: PMI nondisplaced. Regular rate & rhythm. + s3 3/6 MR Lungs: clear Abdomen: soft, nontender, nondistended. No hepatosplenomegaly. No bruits or masses. Good bowel sounds. Extremities: no cyanosis, clubbing, rash, edema  Cool  Neuro: alert & orientedx3, cranial nerves grossly intact. moves all 4 extremities w/o difficulty. Affect pleasant  She has end-stage HF due to RV pacing. She has deteriorated quite a bit over last few months. Will plan support with DBA and see how much she improves. If we can stabilize her for a while may be able to reconsider CRT upgrade but will not be a salvage therapy. Np diuretics for now. Follow co-ox and CVP. D/w Drs. Patwardhan and Quentin Ore.   Glori Bickers, MD  4:54 PM

## 2021-03-30 NOTE — Progress Notes (Signed)
Peripherally Inserted Central Catheter Placement  The IV Nurse has discussed with the patient and/or persons authorized to consent for the patient, the purpose of this procedure and the potential benefits and risks involved with this procedure.  The benefits include less needle sticks, lab draws from the catheter, and the patient may be discharged home with the catheter. Risks include, but not limited to, infection, bleeding, blood clot (thrombus formation), and puncture of an artery; nerve damage and irregular heartbeat and possibility to perform a PICC exchange if needed/ordered by physician.  Alternatives to this procedure were also discussed.  Bard Power PICC patient education guide, fact sheet on infection prevention and patient information card has been provided to patient /or left at bedside.    PICC Placement Documentation  PICC Double Lumen [id number] PICC Right Basilic 33 cm (Active)  Indication for Insertion or Continuance of Line Vasoactive infusions 03/30/21 1200  Site Assessment Clean;Dry;Intact 03/30/21 1200  Lumen #1 Status Flushed;Blood return noted 03/30/21 1200  Lumen #2 Status Flushed;Blood return noted 03/30/21 1200  Dressing Type Transparent 03/30/21 1200  Dressing Status Clean;Dry;Intact 03/30/21 1200  Antimicrobial disc in place? Yes 03/30/21 1200  Dressing Change Due 04/06/21 03/30/21 1200       Jule Economy Horton 03/30/2021, 12:19 PM

## 2021-03-30 NOTE — Progress Notes (Signed)
Physical Therapy Treatment Patient Details Name: Holly [name] MRN: [id number] DOB: [birth year] Today's Date: 03/30/2021    History of Present Illness Pt is an 80 y/o female admitted 5/23 secondary to abnormal labs and AKI. 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 PMH includes a fib, CHF, s/p pacemaker, HTN, and CKD.    PT Comments    Patient progressing well towards PT goals. Tolerated gait training today with Min guard-Min A and use of RW for support. Pt leaning with forearms on walker handles and demonstrates increased hip/knee flexion throughout gait cycle which daughter reports as baseline. Does not have her special shoes here which help her walk. Requires Min-Mod A to stand from different surface heights. Continue to recommend [facility] services to improve overall strength/mobility. Will follow.    Follow Up Recommendations  Home health PT;Supervision for mobility/OOB     Equipment Recommendations  Wheelchair (measurements PT);Wheelchair cushion (measurements PT);3in1 (PT)    Recommendations for Other Services       Precautions / Restrictions Precautions Precautions: Fall Restrictions Weight Bearing Restrictions: No    Mobility  Bed Mobility               General bed mobility comments: Sitting on BSC upon PT arrival.    Transfers Overall transfer level: Needs assistance Equipment used: Rolling walker (2 wheeled) Transfers: Sit to/from [facility] Sit to Stand: Min assist;Mod assist Stand pivot transfers: Min assist       General transfer comment: Assist to power to standing from [facility] x1, from EOB x2, transferred to chair post ambulation. pt with flexed trunk and flexed at hips/knees. Does not have special shoes here.  Ambulation/Gait Ambulation/Gait assistance: Min assist;Min guard Gait Distance (Feet): 12 Feet (+ 24') Assistive device: Rolling walker (2 wheeled) Gait Pattern/deviations: Step-to pattern;Narrow base of support;Step-through  pattern;Decreased step length - right;Decreased step length - left;Decreased dorsiflexion - right;Decreased dorsiflexion - left Gait velocity: decreased   General Gait Details: Slow, mildly unsteady gait with narrow BoS and decreased foot clearance witih pt leaning with forearms on walker handles and increased knee flexion throughout. 1 seated rest break.   Stairs             Wheelchair Mobility    Modified Rankin (Stroke Patients Only)       Balance Overall balance assessment: Needs assistance Sitting-balance support: No upper extremity supported;Feet supported Sitting balance-Leahy Scale: Fair Sitting balance - Comments: kyphotic posture   Standing balance support: During functional activity Standing balance-Leahy Scale: Poor Standing balance comment: Reliant on BUE support with forearms leaning on walker handles due to inability to stand upright secondary to back pain.                            Cognition Arousal/Alertness: Awake/alert Behavior During Therapy: WFL for tasks assessed/performed Overall Cognitive Status: Within Functional Limits for tasks assessed                                        Exercises      General Comments General comments (skin integrity, edema, etc.): daughter present during session.      Pertinent Vitals/Pain Pain Assessment: Faces Faces Pain Scale: Hurts a little bit Pain Location: back when attempting to stand upright Pain Descriptors / Indicators: Aching Pain Intervention(s): Monitored during session;Repositioned    Home Living  Prior Function            PT Goals (current goals can now be found in the care plan section) Progress towards PT goals: Progressing toward goals    Frequency    Min 3X/week      PT Plan Current plan remains appropriate    Co-evaluation              AM-PAC PT "6 Clicks" Mobility   Outcome Measure  Help needed turning from  your back to your side while in a flat bed without using bedrails?: A Little Help needed moving from lying on your back to sitting on the side of a flat bed without using bedrails?: A Little Help needed moving to and from a bed to a chair (including a wheelchair)?: A Little Help needed standing up from a chair using your arms (e.g., wheelchair or bedside chair)?: A Little Help needed to walk in hospital room?: A Little Help needed climbing 3-5 steps with a railing? : A Lot 6 Click Score: 17    End of Session Equipment Utilized During Treatment: Gait belt Activity Tolerance: Patient tolerated treatment well Patient left: in chair;with call bell/phone within reach;with chair alarm set;with family/visitor present Nurse Communication: Mobility status PT Visit Diagnosis: Unsteadiness on feet (R26.81);Muscle weakness (generalized) (M62.81);Difficulty in walking, not elsewhere classified (R26.2)     Time: [id number] PT Time Calculation (min) (ACUTE ONLY): 19 min  Charges:  $Gait Training: 8-22 mins                     Marisa Severin, PT, DPT Acute Rehabilitation Services Pager [phone] Office [place] 03/30/2021, 12:39 PM

## 2021-03-31 DIAGNOSIS — I5023 Acute on chronic systolic (congestive) heart failure: Secondary | ICD-10-CM | POA: Diagnosis not present

## 2021-03-31 DIAGNOSIS — N179 Acute kidney failure, unspecified: Secondary | ICD-10-CM | POA: Diagnosis not present

## 2021-03-31 DIAGNOSIS — I5084 End stage heart failure: Secondary | ICD-10-CM | POA: Diagnosis not present

## 2021-03-31 LAB — CBC
HCT: 34.1 % — ABNORMAL LOW (ref 36.0–46.0)
Hemoglobin: 11.1 g/dL — ABNORMAL LOW (ref 12.0–15.0)
MCH: 28.1 pg (ref 26.0–34.0)
MCHC: 32.6 g/dL (ref 30.0–36.0)
MCV: 86.3 fL (ref 80.0–100.0)
Platelets: 201 10*3/uL (ref 150–400)
RBC: 3.95 MIL/uL (ref 3.87–5.11)
RDW: 16.1 % — ABNORMAL HIGH (ref 11.5–15.5)
WBC: 4.5 10*3/uL (ref 4.0–10.5)
nRBC: 0 % (ref 0.0–0.2)

## 2021-03-31 LAB — RENAL FUNCTION PANEL
Albumin: 2.8 g/dL — ABNORMAL LOW (ref 3.5–5.0)
Anion gap: 6 (ref 5–15)
BUN: 46 mg/dL — ABNORMAL HIGH (ref 8–23)
CO2: 26 mmol/L (ref 22–32)
Calcium: 10.9 mg/dL — ABNORMAL HIGH (ref 8.9–10.3)
Chloride: 103 mmol/L (ref 98–111)
Creatinine, Ser: 1.56 mg/dL — ABNORMAL HIGH (ref 0.44–1.00)
GFR, Estimated: 33 mL/min — ABNORMAL LOW (ref >60)
Glucose, Bld: 93 mg/dL (ref 70–99)
Phosphorus: 2.8 mg/dL (ref 2.5–4.6)
Potassium: 3.6 mmol/L (ref 3.5–5.1)
Sodium: 135 mmol/L (ref 135–145)

## 2021-03-31 LAB — COOXEMETRY PANEL
Carboxyhemoglobin: 1.4 % (ref 0.5–1.5)
Methemoglobin: 0.9 % (ref 0.0–1.5)
O2 Saturation: 68.7 %
Total hemoglobin: 11 g/dL — ABNORMAL LOW (ref 12.0–16.0)

## 2021-03-31 LAB — MAGNESIUM: Magnesium: 2 mg/dL (ref 1.7–2.4)

## 2021-03-31 NOTE — Progress Notes (Signed)
PROGRESS NOTE    Holly [name]  [id number] DOB: [birth year] DOA: 03/26/2021 PCP: Merrilee Seashore, MD  Brief Narrative:  80 y.o. female with medical history significant for heart block s/p pacemaker, paroxysmal atrial fibrillation on Eliquis, combined systolic and diastolic CHF, hypertension, CKD 3a, and chronic iron deficiency anemia, hyperlipidemia, anxiety who presents at the request of her PCP for abnormal labs. She complains of increasing shortness of breath both at rest and with exertion for the past 2 months.  For the past month she has also noticed upper abdominal pain every time that she eats.  Has had decreased p.o. intake because of this pain and because of lack of appetite. CT of the abdomen obtained shows contracted gallbladder with cholelithiasis but no cholecystitis.   There is also notable anasarca of the abdominal wall and edema of the mesenteric lymphatics.  Patient was recently seen by her cardiologist and then was seen by her PCP on 5/20 and found to have volume depletion and a creatinine of 2.1  On arrival in the ED BNP >4000, creatinine 2.3  Started on diuretics   Assessment & Plan:   Principal Problem:   AKI (acute kidney injury) ([place]) Active Problems:   Essential hypertension   DNR (do not resuscitate) discussion   S/P placement of cardiac pacemaker   Acute systolic heart failure (HCC)   Paroxysmal A-fib (HCC)   Cholelithiasis   Congestive heart failure (HCC)   Malnutrition of moderate degree   Weakness generalized   Palliative care by specialist   Acute on chronic Combined systolic and diastolic CHF 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 -Repeat echo shows a depressed EF of 20% -Dobutamine, diuresis as per advanced heart failure team -Weight unchanged, I/O status -2.1 -Palliative care has seen the patient for end-stage heart failure-patient wants everything done-appreciate input  AKI on CKD stage IIIa Cardiorenal syndrome -Defer to cardiology management in  addition to diuretics -Creatinine continues to improve with offloading of fluid  Cholelithiasis without cholecystitis - Postprandial fullness/pain over the past few weeks  - Hold off on further imaging, CT abdomen pelvis shows cholelithiasis without cholecystitis as well as diverticulosis without diverticulitis -Continue heart healthy diet  Paroxysmal atrial fibrillation - Continue Eliquis 2.5 twice daily -Beta-blocker discontinued secondary to hypotension  Left upper pole kidney cyst, incidental finding - Cysts suspected to be benign hemorrhagic or proteinaceous cyst -  Renal ultrasound relatively benign--no further w/u  History of heart block s/p pacemaker - Stable  Hypertension  -hold Losartan due to AKI -Metoprolol DC by cardiology  Hyperlipidemia -continue statin  DVT prophylaxis: Eliquis Code Status: Full Family Communication: Discussed with husband at the bedside 5/20.  Status is: Inpatient  Dispo: The patient is from: Home              Anticipated d/c is to: Awaiting PT eval appreciate them seeing patient today              Anticipated d/c date is: Defer to cardiology feasibility of discharge Physical therapy has seen the patient last 5/27 recommending wheelchair 3 and 1 and home health supervision PT and OT              Patient currently not medically stable for discharge  Consultants:   None  Procedures:   None indicated  Antimicrobials:  None indicated  Subjective:  Sitting in chair Husband at the bedside Shortness of breath feels fine Getting dobutamine at 2.5 On monitor she has a paced rhythm She has no chest pain  Objective:  Vitals:   03/30/21 2000 03/31/21 0400 03/31/21 0622 03/31/21 0845  BP: (!) 103/57 121/74  (!) 104/57  Pulse: 60 65  73  Resp: 17 15    Temp: 98.1 F (36.7 C) 98.4 F (36.9 C)    TempSrc: Oral Oral    SpO2: 100% 96%  100%  Weight:   53.3 kg   Height:        Intake/Output Summary (Last 24 hours) at  03/31/2021 1223 Last data filed at 03/31/2021 0900 Gross per 24 hour  Intake 1117.68 ml  Output 600 ml  Net 517.68 ml   Filed Weights   03/30/21 0244 03/30/21 0617 03/31/21 0622  Weight: 53.6 kg 53.1 kg 53.3 kg    Examination:  Edentulous no distress Cannot appreciate JVD as she is sitting Abdomen is soft She has no crackles or rales I cannot appreciate much lower extremity edema Neurologically intact-power 5/ Psych euthymic  Data Reviewed: I have personally reviewed following labs and imaging studies  CBC: Recent Labs  Lab 03/26/21 1523 03/31/21 0429  WBC 4.3 4.5  NEUTROABS 2.5  --   HGB 11.1* 11.1*  HCT 34.0* 34.1*  MCV 85.0 86.3  PLT 223 123456   Basic Metabolic Panel: Recent Labs  Lab 03/26/21 1523 03/27/21 0445 03/28/21 0857 03/30/21 0738 03/31/21 0429  NA 134* 135 135 135 135  K 4.2 4.1 3.6 3.4* 3.6  CL 105 107 104 101 103  CO2 17* 17* 19* 23 26  GLUCOSE 89 95 106* 85 93  BUN 75* 75* 73* 54* 46*  CREATININE 2.14* 2.23* 2.26* 1.78* 1.56*  CALCIUM 11.0* 10.8* 10.7* 10.7* 10.9*  MG  --   --   --   --  2.0  PHOS  --   --   --   --  2.8   GFR: Estimated Creatinine Clearance: 24.2 mL/min (A) (by C-G formula based on SCr of 1.56 mg/dL (H)). Liver Function Tests: Recent Labs  Lab 03/26/21 1523 03/28/21 0857 03/30/21 0738 03/31/21 0429  AST 28 38 28  --   ALT 26 34 28  --   ALKPHOS 46 45 40  --   BILITOT 1.8* 1.7* 1.3*  --   PROT 7.6 7.0 6.9  --   ALBUMIN 3.5 3.1* 3.0* 2.8*   Recent Labs  Lab 03/26/21 1523  LIPASE 46   No results for input(s): AMMONIA in the last 168 hours. Coagulation Profile: No results for input(s): INR, PROTIME in the last 168 hours. Cardiac Enzymes: No results for input(s): CKTOTAL, CKMB, CKMBINDEX, TROPONINI in the last 168 hours. BNP (last 3 results) Recent Labs    07/20/20 1201  PROBNP 9,907*   HbA1C: No results for input(s): HGBA1C in the last 72 hours. CBG: No results for input(s): GLUCAP in the last 168  hours. Lipid Profile: No results for input(s): CHOL, HDL, LDLCALC, TRIG, CHOLHDL, LDLDIRECT in the last 72 hours. Thyroid Function Tests: No results for input(s): TSH, T4TOTAL, FREET4, T3FREE, THYROIDAB in the last 72 hours. Anemia Panel: No results for input(s): VITAMINB12, FOLATE, FERRITIN, TIBC, IRON, RETICCTPCT in the last 72 hours. Sepsis Labs: Recent Labs  Lab 03/30/21 0322 03/30/21 0738  LATICACIDVEN 1.2 1.3    Recent Results (from the past 240 hour(s))  Resp Panel by RT-PCR (Flu A&B, Covid) Nasopharyngeal Swab     Status: None   Collection Time: 03/26/21 11:43 PM   Specimen: Nasopharyngeal Swab; Nasopharyngeal(NP) swabs in vial transport medium  Result Value Ref Range Status   SARS Coronavirus  2 by RT PCR NEGATIVE NEGATIVE Final    Comment: (NOTE) SARS-CoV-2 target nucleic acids are NOT DETECTED.  The SARS-CoV-2 RNA is generally detectable in upper respiratory specimens during the acute phase of infection. The lowest concentration of SARS-CoV-2 viral copies this assay can detect is 138 copies/mL. A negative result does not preclude SARS-Cov-2 infection and should not be used as the sole basis for treatment or other patient management decisions. A negative result may occur with  improper specimen collection/handling, submission of specimen other than nasopharyngeal swab, presence of viral mutation(s) within the areas targeted by this assay, and inadequate number of viral copies(<138 copies/mL). A negative result must be combined with clinical observations, patient history, and epidemiological information. The expected result is Negative.  Fact Sheet for Patients:  EntrepreneurPulse.com.au  Fact Sheet for Healthcare Providers:  IncredibleEmployment.be  This test is no t yet approved or cleared by the [place] FDA and  has been authorized for detection and/or diagnosis of SARS-CoV-2 by FDA under an Emergency Use Authorization  (EUA). This EUA will remain  in effect (meaning this test can be used) for the duration of the COVID-19 declaration under Section 564(b)(1) of the Act, 21 U.S.C.section 360bbb-3(b)(1), unless the authorization is terminated  or revoked sooner.       Influenza A by PCR NEGATIVE NEGATIVE Final   Influenza B by PCR NEGATIVE NEGATIVE Final    Comment: (NOTE) The Xpert Xpress SARS-CoV-2/FLU/RSV plus assay is intended as an aid in the diagnosis of influenza from Nasopharyngeal swab specimens and should not be used as a sole basis for treatment. Nasal washings and aspirates are unacceptable for Xpert Xpress SARS-CoV-2/FLU/RSV testing.  Fact Sheet for Patients: EntrepreneurPulse.com.au  Fact Sheet for Healthcare Providers: IncredibleEmployment.be  This test is not yet approved or cleared by the [place] FDA and has been authorized for detection and/or diagnosis of SARS-CoV-2 by FDA under an Emergency Use Authorization (EUA). This EUA will remain in effect (meaning this test can be used) for the duration of the COVID-19 declaration under Section 564(b)(1) of the Act, 21 U.S.C. section 360bbb-3(b)(1), unless the authorization is terminated or revoked.  Performed at [facility], [place] [address] [place], [place] [address]          Radiology Studies: DG Chest 1 View  Result Date: 03/30/2021 CLINICAL DATA:  Heart failure.  Cardiac pacemaker. EXAM: CHEST  1 VIEW COMPARISON:  04/24/2020 FINDINGS: Enlargement of the cardiac silhouette. There is a left-sided dual chamber cardiac pacemaker. S-shaped scoliosis in the thoracic spine. Linear density in the right lower chest is most compatible with a Mach line from overlying soft tissues. Lungs are clear without pulmonary edema. Severe degenerative changes at the right shoulder. Abnormal appearance of the left humeral head appears chronic. Atherosclerotic calcifications at the aortic arch.  IMPRESSION: 1. Cardiomegaly without focal lung disease or overt pulmonary edema. 2. Stable appearance of the cardiac pacemaker. 3. Scoliosis. 4. Chronic changes in both shoulders. Electronically Signed   By: Markus Daft M.D.   On: 03/30/2021 10:34   [place] EKG SITE RITE  Result Date: 03/30/2021 If Site Rite image not attached, placement could not be confirmed due to current cardiac rhythm.  Scheduled Meds: . apixaban  2.5 mg Oral BID  . vitamin C  1,000 mg Oral Daily  . Chlorhexidine Gluconate Cloth  6 each Topical Daily  . feeding supplement  237 mL Oral BID BM  . furosemide  60 mg Intravenous Daily  . multivitamin with minerals  1 tablet Oral Daily  . polyethylene glycol  17 g Oral Daily  . rosuvastatin  10 mg Oral QPM  . senna-docusate  1 tablet Oral QHS  . sodium chloride flush  10-40 mL Intracatheter Q12H   Continuous Infusions: . DOBUTamine 2.5 mcg/kg/min (03/30/21 1806)     LOS: 4 days   Time spent: 25 minutes  Verneita Griffes, MD [facility] 12:23 PM   If 7PM-7AM, please contact night-coverage [email or web address]  03/31/2021, 12:23 PM

## 2021-03-31 NOTE — Progress Notes (Signed)
Subjective:  Patient seen and examined bedside at approximately 12:15 PM Denies chest pain. Shortness of breath improved. Currently on dobutamine 2.5 mcg/kg/min. Serum creatinine improving.  Objective:  Vital Signs in the last 24 hours: Temp:  [97.9 F (36.6 C)-98.4 F (36.9 C)] 98.4 F (36.9 C) (05/28 0400) Pulse Rate:  [59-73] 73 (05/28 0845) Resp:  [15-20] 15 (05/28 0400) BP: (103-121)/(57-74) 104/57 (05/28 0845) SpO2:  [96 %-100 %] 100 % (05/28 0845) Weight:  [53.3 kg] 53.3 kg (05/28 0622)  Intake/Output from previous day: 05/27 0701 - 05/28 0700 In: 1122.7 [P.O.:1080; I.V.:42.7] Out: 500 [Urine:500]   PHYSICAL EXAM: Vitals with BMI 03/31/2021 03/31/2021 03/31/2021  Height - - -  Weight - 117 lbs 6 oz -  BMI - 123456 -  Systolic 123456 - [id number]  Diastolic 57 - 74  Pulse 73 - 65    CONSTITUTIONAL: Frail in appearance, elderly female, hemodynamically stable, no acute distress.   SKIN: Skin is warm and dry. No rash noted. No cyanosis. No pallor. No jaundice HEAD: Normocephalic and atraumatic.  EYES: No scleral icterus MOUTH/THROAT: Moist oral membranes.  NECK: JVP 7-8. No thyromegaly noted. No carotid bruits  LYMPHATIC: No visible cervical adenopathy.  CHEST Normal respiratory effort. No intercostal retractions.  Pacemaker site well-healed. LUNGS: Clear to auscultation.  No stridor. No wheezes. No rales.  CARDIOVASCULAR: Regular rate and rhythm, positive [id number] holosystolic murmur heard at the apex, no gallops or rubs ABDOMINAL: Nonobese, soft, nontender, nondistended, positive bowel sounds, no apparent ascites.  EXTREMITIES: Minimal bilateral edema  HEMATOLOGIC: No significant bruising NEUROLOGIC: Oriented to person, place, and time. Nonfocal. Normal muscle tone.  PSYCHIATRIC: Normal mood and affect. Normal behavior. Cooperative  Lab Results: BMP Recent Labs    05/19/20 1114 06/05/20 1311 08/25/20 1601 03/26/21 1523 03/28/21 0857 03/30/21 0738 03/31/21 0429  NA  130* 131* 132*   < > 135 135 135  K 4.7 4.5 4.8   < > 3.6 3.4* 3.6  CL 95* 97 98   < > 104 101 103  CO2 17* 21 20   < > 19* 23 26  GLUCOSE 99 79 109*   < > 106* 85 93  BUN 18 25 37*   < > 73* 54* 46*  CREATININE 1.21* 1.29* 1.36*   < > 2.26* 1.78* 1.56*  CALCIUM 12.1* 11.9* 10.4*   < > 10.7* 10.7* 10.9*  GFRNONAA 42* 39* 37*   < > 21* 29* 33*  GFRAA 49* 45* 42*  --   --   --   --    < > = values in this interval not displayed.    CBC Recent Labs  Lab 03/26/21 1523 03/31/21 0429  WBC 4.3 4.5  RBC 4.00 3.95  HGB 11.1* 11.1*  HCT 34.0* 34.1*  PLT 223 201  MCV 85.0 86.3  MCH 27.8 28.1  MCHC 32.6 32.6  RDW 15.9* 16.1*  LYMPHSABS 0.8  --   MONOABS 0.7  --   EOSABS 0.2  --   BASOSABS 0.1  --      BNP (last 3 results) Recent Labs    06/05/20 1311 03/26/21 1523 03/30/21 0738  BNP 1,854.5* >4,500.0* >4,500.0*    Lipid Panel     Component Value Date/Time   CHOL 145 11/01/2019 1126   TRIG 49 11/01/2019 1126   HDL 76 11/01/2019 1126   CHOLHDL 1.9 11/01/2019 1126   LDLCALC 58 11/01/2019 1126     Hepatic Function Panel Recent Labs    03/26/21 1523  03/28/21 0857 03/30/21 0738 03/31/21 0429  PROT 7.6 7.0 6.9  --   ALBUMIN 3.5 3.1* 3.0* 2.8*  AST 28 38 28  --   ALT 26 34 28  --   ALKPHOS 46 45 40  --   BILITOT 1.8* 1.7* 1.3*  --     CARDIAC STUDIES:  EKG 03/27/2021: AV dual paced rhythm  1 Brief NSVT episode on pacemaker interrogation on 02/14/2021 No recent alert  Echocardiogram 03/27/2021: 1. Left ventricular ejection fraction, by estimation, is <20%. The left  ventricle has severely decreased function. The left ventricle demonstrates  global hypokinesis. There is mild left ventricular hypertrophy. Left  ventricular diastolic parameters are  indeterminate.  2. Right ventricular systolic function is normal. The right ventricular  size is normal.  3. Left atrial size was severely dilated.  4. Right atrial size was severely dilated.  5. The  mitral valve is normal in structure. Severe mitral valve  regurgitation. No evidence of mitral stenosis.  6. Tricuspid valve regurgitation is severe.  7. The aortic valve is normal in structure. There is mild calcification  of the aortic valve. There is mild thickening of the aortic valve. Aortic  valve regurgitation is trivial. Mild to moderate aortic valve  sclerosis/calcification is present, without  any evidence of aortic stenosis.  8. The inferior vena cava is dilated in size with <50% respiratory  variability, suggesting right atrial pressure of 15 mmHg.   Assessment & Recommendations:  80 year old [place] American female with hypertension, dual-chamber pacemaker placement for AV block, moderate, medically managed coronary artery disease (LCx), hypertension, NSVT, HFrEF  Acute on chronic systolic heart failure: Acute decompensation.  LVEF <20%. BNP >4500 Clinically improving: Denies orthopnea, PND, Hartness of breath.  Currently on dobutamine 2.5 mics/kg/min Co-Ox improved on inotropic support.  Continue diuresis, creatinine improving. Losartan and spironolactone held in the setting of AKI. Metoprolol currently being held due to acute exacerbation of CHF and currently on dobutamine. Patient's acute decompensated heart failure/cardiomyopathy most likely secondary to RV pacing. Advanced heart failure team following the patient as well.  Their  input is greatly appreciated spoke to Dr. Haroldine Laws earlier this morning as well.  AKI: Improving, continue to monitor. Cardiorenal syndrome. Continue diuresis.   PAF: Continue eliquis 2.5 mg bid  CAD: Moderate nonobstructive disease (Cath 2019) No angina symptoms at this time.   Total time spent: 25 minutes.  Rex Kras, [place], [facility]  Pager: [phone] Office: [phone]

## 2021-03-31 NOTE — Progress Notes (Signed)
Advanced Heart Failure Rounding Note   Subjective:    Feels weak. Denies SOB, orthopnea or PND.   On DBA 2.5 Co-ox 52% -> 69% CVP 7-8  Scr 2.2 -> 1.8 -> 1.5   Objective:   Weight Range:  Vital Signs:   Temp:  [97.8 F (36.6 C)-98.4 F (36.9 C)] 98.4 F (36.9 C) (05/28 0400) Pulse Rate:  [59-73] 73 (05/28 0845) Resp:  [15-20] 15 (05/28 0400) BP: (103-121)/(57-74) 104/57 (05/28 0845) SpO2:  [96 %-100 %] 100 % (05/28 0845) Weight:  [53.3 kg] 53.3 kg (05/28 0622) Last BM Date: 03/31/21  Weight change: Filed Weights   03/30/21 0244 03/30/21 0617 03/31/21 0622  Weight: 53.6 kg 53.1 kg 53.3 kg    Intake/Output:   Intake/Output Summary (Last 24 hours) at 03/31/2021 1105 Last data filed at 03/31/2021 0900 Gross per 24 hour  Intake 1117.68 ml  Output 600 ml  Net 517.68 ml     Physical Exam: General:  Thin elderly. Weak appearing. No resp difficulty HEENT: normal Neck: supple. JVP 7 . Carotids 2+ bilat; no bruits. No lymphadenopathy or thryomegaly appreciated. Cor: PMI nondisplaced. Regular rate & rhythm. 2/6 MR Lungs: clear Abdomen: soft, nontender, nondistended. No hepatosplenomegaly. No bruits or masses. Good bowel sounds. Extremities: no cyanosis, clubbing, rash, edema Neuro: alert & orientedx3, cranial nerves grossly intact. moves all 4 extremities w/o difficulty. Affect pleasant  Telemetry: AV paced 70s Personally reviewed  Labs: Basic Metabolic Panel: Recent Labs  Lab 03/26/21 1523 03/27/21 0445 03/28/21 0857 03/30/21 0738 03/31/21 0429  NA 134* 135 135 135 135  K 4.2 4.1 3.6 3.4* 3.6  CL 105 107 104 101 103  CO2 17* 17* 19* 23 26  GLUCOSE 89 95 106* 85 93  BUN 75* 75* 73* 54* 46*  CREATININE 2.14* 2.23* 2.26* 1.78* 1.56*  CALCIUM 11.0* 10.8* 10.7* 10.7* 10.9*  MG  --   --   --   --  2.0  PHOS  --   --   --   --  2.8    Liver Function Tests: Recent Labs  Lab 03/26/21 1523 03/28/21 0857 03/30/21 0738 03/31/21 0429  AST 28 38 28  --    ALT 26 34 28  --   ALKPHOS 46 45 40  --   BILITOT 1.8* 1.7* 1.3*  --   PROT 7.6 7.0 6.9  --   ALBUMIN 3.5 3.1* 3.0* 2.8*   Recent Labs  Lab 03/26/21 1523  LIPASE 46   No results for input(s): AMMONIA in the last 168 hours.  CBC: Recent Labs  Lab 03/26/21 1523 03/31/21 0429  WBC 4.3 4.5  NEUTROABS 2.5  --   HGB 11.1* 11.1*  HCT 34.0* 34.1*  MCV 85.0 86.3  PLT 223 201    Cardiac Enzymes: No results for input(s): CKTOTAL, CKMB, CKMBINDEX, TROPONINI in the last 168 hours.  BNP: BNP (last 3 results) Recent Labs    06/05/20 1311 03/26/21 1523 03/30/21 0738  BNP 1,854.5* >4,500.0* >4,500.0*    ProBNP (last 3 results) Recent Labs    07/20/20 1201  PROBNP 9,907*      Other results:  Imaging: DG Chest 1 View  Result Date: 03/30/2021 CLINICAL DATA:  Heart failure.  Cardiac pacemaker. EXAM: CHEST  1 VIEW COMPARISON:  04/24/2020 FINDINGS: Enlargement of the cardiac silhouette. There is a left-sided dual chamber cardiac pacemaker. S-shaped scoliosis in the thoracic spine. Linear density in the right lower chest is most compatible with a Mach line from  overlying soft tissues. Lungs are clear without pulmonary edema. Severe degenerative changes at the right shoulder. Abnormal appearance of the left humeral head appears chronic. Atherosclerotic calcifications at the aortic arch. IMPRESSION: 1. Cardiomegaly without focal lung disease or overt pulmonary edema. 2. Stable appearance of the cardiac pacemaker. 3. Scoliosis. 4. Chronic changes in both shoulders. Electronically Signed   By: Markus Daft M.D.   On: 03/30/2021 10:34   [place] EKG SITE RITE  Result Date: 03/30/2021 If Site Rite image not attached, placement could not be confirmed due to current cardiac rhythm.     Medications:     Scheduled Medications: . apixaban  2.5 mg Oral BID  . vitamin C  1,000 mg Oral Daily  . Chlorhexidine Gluconate Cloth  6 each Topical Daily  . feeding supplement  237 mL Oral BID BM  .  furosemide  60 mg Intravenous Daily  . multivitamin with minerals  1 tablet Oral Daily  . polyethylene glycol  17 g Oral Daily  . rosuvastatin  10 mg Oral QPM  . senna-docusate  1 tablet Oral QHS  . sodium chloride flush  10-40 mL Intracatheter Q12H     Infusions: . DOBUTamine 2.5 mcg/kg/min (03/30/21 1806)     PRN Medications:  ALPRAZolam, sodium chloride flush   Assessment/Plan:   1. 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 Pt previously declined upgrade to CRT-P - Echo 07/15/2017: EF 65-70%, RV normal (PPM implanted 07/2017) - Echo 04/04/2020: EF 25-30%, RV normal  - Echo 03/27/2021: EF < 20%, global HK. RV normal. 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 Volume status ok  - She has deteriorated quite a bit over last few months. Will plan support with DBA and see how much she improves. If we can stabilize her for a while may be able to reconsider CRT upgrade but will not be a salvage therapy. Continue to hold diuretics for now.   2. AKI - prior SCr from 10/21 was 1.4 - 2.1>>2.3 this admit, down to 1.56 today  - suspect cardiorenal  - place PICC to check Co-ox   3. Mitral Regurgitation  - severe on Echo, likely functional   4. CHB - s/p PPM   5. Hypokalemia - K 3.6  - supp w/ KCl   6. CAD - records indicate mod LCx disease.  - No s/s angina - continue medical management per cardiology    Length of Stay: 4  Glori Bickers MD 03/31/2021, 11:05 AM  Advanced Heart Failure Team Pager [phone] (M-F; 7a - 4p)  Please contact [facility] for night-coverage after hours (4p -7a ) and weekends on [email or web address]

## 2021-04-01 DIAGNOSIS — I5084 End stage heart failure: Secondary | ICD-10-CM | POA: Diagnosis not present

## 2021-04-01 DIAGNOSIS — I5023 Acute on chronic systolic (congestive) heart failure: Secondary | ICD-10-CM | POA: Diagnosis not present

## 2021-04-01 DIAGNOSIS — N179 Acute kidney failure, unspecified: Secondary | ICD-10-CM | POA: Diagnosis not present

## 2021-04-01 LAB — COMPREHENSIVE METABOLIC PANEL
ALT: 19 U/L (ref 0–44)
AST: 20 U/L (ref 15–41)
Albumin: 2.8 g/dL — ABNORMAL LOW (ref 3.5–5.0)
Alkaline Phosphatase: 35 U/L — ABNORMAL LOW (ref 38–126)
Anion gap: 7 (ref 5–15)
BUN: 38 mg/dL — ABNORMAL HIGH (ref 8–23)
CO2: 27 mmol/L (ref 22–32)
Calcium: 10.7 mg/dL — ABNORMAL HIGH (ref 8.9–10.3)
Chloride: 100 mmol/L (ref 98–111)
Creatinine, Ser: 1.59 mg/dL — ABNORMAL HIGH (ref 0.44–1.00)
GFR, Estimated: 33 mL/min — ABNORMAL LOW (ref >60)
Glucose, Bld: 98 mg/dL (ref 70–99)
Potassium: 3.5 mmol/L (ref 3.5–5.1)
Sodium: 134 mmol/L — ABNORMAL LOW (ref 135–145)
Total Bilirubin: 1.1 mg/dL (ref 0.3–1.2)
Total Protein: 6.6 g/dL (ref 6.5–8.1)

## 2021-04-01 LAB — COOXEMETRY PANEL
Carboxyhemoglobin: 1.6 % — ABNORMAL HIGH (ref 0.5–1.5)
Methemoglobin: 0.9 % (ref 0.0–1.5)
O2 Saturation: 72.9 %
Total hemoglobin: 10.5 g/dL — ABNORMAL LOW (ref 12.0–16.0)

## 2021-04-01 LAB — CBC
HCT: 32.2 % — ABNORMAL LOW (ref 36.0–46.0)
Hemoglobin: 10.3 g/dL — ABNORMAL LOW (ref 12.0–15.0)
MCH: 27.6 pg (ref 26.0–34.0)
MCHC: 32 g/dL (ref 30.0–36.0)
MCV: 86.3 fL (ref 80.0–100.0)
Platelets: 174 10*3/uL (ref 150–400)
RBC: 3.73 MIL/uL — ABNORMAL LOW (ref 3.87–5.11)
RDW: 16.2 % — ABNORMAL HIGH (ref 11.5–15.5)
WBC: 4.4 10*3/uL (ref 4.0–10.5)
nRBC: 0 % (ref 0.0–0.2)

## 2021-04-01 LAB — MAGNESIUM: Magnesium: 1.9 mg/dL (ref 1.7–2.4)

## 2021-04-01 LAB — BASIC METABOLIC PANEL
Anion gap: 6 (ref 5–15)
BUN: 33 mg/dL — ABNORMAL HIGH (ref 8–23)
CO2: 27 mmol/L (ref 22–32)
Calcium: 11 mg/dL — ABNORMAL HIGH (ref 8.9–10.3)
Chloride: 100 mmol/L (ref 98–111)
Creatinine, Ser: 1.6 mg/dL — ABNORMAL HIGH (ref 0.44–1.00)
GFR, Estimated: 32 mL/min — ABNORMAL LOW (ref >60)
Glucose, Bld: 137 mg/dL — ABNORMAL HIGH (ref 70–99)
Potassium: 4.5 mmol/L (ref 3.5–5.1)
Sodium: 133 mmol/L — ABNORMAL LOW (ref 135–145)

## 2021-04-01 MED ORDER — POTASSIUM CHLORIDE CRYS ER 20 MEQ PO TBCR
40.0000 meq | EXTENDED_RELEASE_TABLET | Freq: Once | ORAL | Status: AC
  Administered 2021-04-01: 40 meq via ORAL
  Filled 2021-04-01: qty 2

## 2021-04-01 MED ORDER — TORSEMIDE 20 MG PO TABS
40.0000 mg | ORAL_TABLET | Freq: Every day | ORAL | Status: DC
  Administered 2021-04-02 – 2021-04-04 (×3): 40 mg via ORAL
  Filled 2021-04-01 (×3): qty 2

## 2021-04-01 NOTE — Progress Notes (Addendum)
Advanced Heart Failure Rounding Note   Subjective:    Remains on DBA at 2.5. Co-ox 73% CVP 8  Getting lasix 60 IV daily  Denies CP, SOB, orthopnea or PND. Creatinine stabilized at 1.5   Objective:   Weight Range:  Vital Signs:   Temp:  [97.5 F (36.4 C)-99 F (37.2 C)] 98.6 F (37 C) (05/29 0420) Pulse Rate:  [59-73] 68 (05/29 0420) Resp:  [15-20] 20 (05/29 0420) BP: (95-106)/(51-66) 99/64 (05/29 0420) SpO2:  [99 %-100 %] 99 % (05/29 0420) Weight:  [53.3 kg] 53.3 kg (05/29 0420) Last BM Date: 03/31/21  Weight change: Filed Weights   03/30/21 0617 03/31/21 0622 04/01/21 0420  Weight: 53.1 kg 53.3 kg 53.3 kg    Intake/Output:   Intake/Output Summary (Last 24 hours) at 04/01/2021 0820 Last data filed at 04/01/2021 0420 Gross per 24 hour  Intake 762.18 ml  Output 1000 ml  Net -237.82 ml     Physical Exam: General:  Thin elderly. Weak appearing. No resp difficulty HEENT: normal Neck: supple. no JVD. Carotids 2+ bilat; no bruits. No lymphadenopathy or thryomegaly appreciated. Cor: PMI nondisplaced. Regular rate & rhythm. 2/6 MR Lungs: clear Abdomen: soft, nontender, nondistended. No hepatosplenomegaly. No bruits or masses. Good bowel sounds. Extremities: no cyanosis, clubbing, rash, edema Neuro: alert & orientedx3, cranial nerves grossly intact. moves all 4 extremities w/o difficulty. Affect pleasant   Telemetry: AV paced 70s Personally reviewed  Labs: Basic Metabolic Panel: Recent Labs  Lab 03/27/21 0445 03/28/21 0857 03/30/21 0738 03/31/21 0429 04/01/21 0412  NA 135 135 135 135 134*  K 4.1 3.6 3.4* 3.6 3.5  CL 107 104 101 103 100  CO2 17* 19* '23 26 27  '$ GLUCOSE 95 106* 85 93 98  BUN 75* 73* 54* 46* 38*  CREATININE 2.23* 2.26* 1.78* 1.56* 1.59*  CALCIUM 10.8* 10.7* 10.7* 10.9* 10.7*  MG  --   --   --  2.0 1.9  PHOS  --   --   --  2.8  --     Liver Function Tests: Recent Labs  Lab 03/26/21 1523 03/28/21 0857 03/30/21 0738 03/31/21 0429  04/01/21 0412  AST 28 38 28  --  20  ALT 26 34 28  --  19  ALKPHOS 46 45 40  --  35*  BILITOT 1.8* 1.7* 1.3*  --  1.1  PROT 7.6 7.0 6.9  --  6.6  ALBUMIN 3.5 3.1* 3.0* 2.8* 2.8*   Recent Labs  Lab 03/26/21 1523  LIPASE 46   No results for input(s): AMMONIA in the last 168 hours.  CBC: Recent Labs  Lab 03/26/21 1523 03/31/21 0429 04/01/21 0412  WBC 4.3 4.5 4.4  NEUTROABS 2.5  --   --   HGB 11.1* 11.1* 10.3*  HCT 34.0* 34.1* 32.2*  MCV 85.0 86.3 86.3  PLT 223 201 174    Cardiac Enzymes: No results for input(s): CKTOTAL, CKMB, CKMBINDEX, TROPONINI in the last 168 hours.  BNP: BNP (last 3 results) Recent Labs    06/05/20 1311 03/26/21 1523 03/30/21 0738  BNP 1,854.5* >4,500.0* >4,500.0*    ProBNP (last 3 results) Recent Labs    07/20/20 1201  PROBNP 9,907*      Other results:  Imaging: DG Chest 1 View  Result Date: 03/30/2021 CLINICAL DATA:  Heart failure.  Cardiac pacemaker. EXAM: CHEST  1 VIEW COMPARISON:  04/24/2020 FINDINGS: Enlargement of the cardiac silhouette. There is a left-sided dual chamber cardiac pacemaker. S-shaped scoliosis in the  thoracic spine. Linear density in the right lower chest is most compatible with a Mach line from overlying soft tissues. Lungs are clear without pulmonary edema. Severe degenerative changes at the right shoulder. Abnormal appearance of the left humeral head appears chronic. Atherosclerotic calcifications at the aortic arch. IMPRESSION: 1. Cardiomegaly without focal lung disease or overt pulmonary edema. 2. Stable appearance of the cardiac pacemaker. 3. Scoliosis. 4. Chronic changes in both shoulders. Electronically Signed   By: Markus Daft M.D.   On: 03/30/2021 10:34   [place] EKG SITE RITE  Result Date: 03/30/2021 If Site Rite image not attached, placement could not be confirmed due to current cardiac rhythm.    Medications:     Scheduled Medications: . apixaban  2.5 mg Oral BID  . vitamin C  1,000 mg Oral Daily   . Chlorhexidine Gluconate Cloth  6 each Topical Daily  . feeding supplement  237 mL Oral BID BM  . furosemide  60 mg Intravenous Daily  . multivitamin with minerals  1 tablet Oral Daily  . polyethylene glycol  17 g Oral Daily  . rosuvastatin  10 mg Oral QPM  . senna-docusate  1 tablet Oral QHS  . sodium chloride flush  10-40 mL Intracatheter Q12H    Infusions: . DOBUTamine 2.5 mcg/kg/min (03/30/21 1806)    PRN Medications: ALPRAZolam, sodium chloride flush   Assessment/Plan:   1. 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 Pt previously declined upgrade to CRT-P - Echo 07/15/2017: EF 65-70%, RV normal (PPM implanted 07/2017) - Echo 04/04/2020: EF 25-30%, RV normal  - Echo 03/27/2021: EF < 20%, global HK. RV normal. 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 Volume status ok  - She has deteriorated quite a bit over last few months. Will plan support with DBA and see how much she improves. If we can stabilize her for a while may be able to reconsider CRT upgrade but will not be a salvage therapy. Weight stable. Switch IV lasix to po  - Will have PT/OT see - Long talk with her and 2 daughters about situation and options regarding CRT-P upgrade  - I will d/w Dr. Quentin Ore tomorrow about possible candidacy/timing of potential CRT-P upgrade. Suspect this is just about as good as we can get her. Without CRT-P upgrade suspect mortality is 50-50 at 6 months given how severe her HF was on admit.   2. AKI - prior SCr from 10/21 was 1.4 - 2.1>>2.3. Now stabilized at 1.5  3. Mitral Regurgitation  - severe on Echo, likely functional   4. CHB - s/p PPM   5. Hypokalemia - K 3.5 - supp w/ KCl   6. CAD - records indicate mod LCx disease.  - No s/s angina   Length of Stay: 5  Glori Bickers MD 04/01/2021, 8:20 AM  Advanced Heart Failure  Team Pager [phone] (M-F; 7a - 4p)  Please contact [facility] for night-coverage after hours (4p -7a ) and weekends on [email or web address]

## 2021-04-01 NOTE — Progress Notes (Signed)
PROGRESS NOTE    Holly [name]  [id number] DOB: [birth year] DOA: 03/26/2021 PCP: Merrilee Seashore, MD  Brief Narrative:  81 y.o. female with medical history significant for heart block s/p pacemaker, paroxysmal atrial fibrillation on Eliquis, combined systolic and diastolic CHF, hypertension, CKD 3a, and chronic iron deficiency anemia, hyperlipidemia, anxiety who presents at the request of her PCP for abnormal labs. She complains of increasing shortness of breath both at rest and with exertion for the past 2 months.  For the past month she has also noticed upper abdominal pain every time that she eats.  Has had decreased p.o. intake because of this pain and because of lack of appetite. CT of the abdomen obtained shows contracted gallbladder with cholelithiasis but no cholecystitis.   There is also notable anasarca of the abdominal wall and edema of the mesenteric lymphatics.  Patient was recently seen by her cardiologist and then was seen by her PCP on 5/20 and found to have volume depletion and a creatinine of 2.1  On arrival in the ED BNP >4000, creatinine 2.3  Started on diuretics   Assessment & Plan:   Principal Problem:   AKI (acute kidney injury) ([place]) Active Problems:   Essential hypertension   DNR (do not resuscitate) discussion   S/P placement of cardiac pacemaker   Acute systolic heart failure (HCC)   Paroxysmal A-fib (HCC)   Cholelithiasis   Congestive heart failure (HCC)   Malnutrition of moderate degree   Weakness generalized   Palliative care by specialist   Acute on chronic Combined systolic and diastolic CHF 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 Quentin Ore-- Salvage therapy for her based on Dr. Clayborne Dana last note -Greatly appreciate his discussion regarding prognostication and discussion with family  AKI on CKD stage IIIa Cardiorenal syndrome -Defer to cardiology management in addition to diuretics -Creatinine continues to improve with offloading of fluid  Cholelithiasis without cholecystitis - Postprandial fullness/pain over the past few weeks  - Hold off on further imaging, CT abdomen pelvis shows cholelithiasis without cholecystitis as well as diverticulosis without diverticulitis -Continue heart healthy diet  Left upper pole kidney cyst, incidental finding - Cysts suspected to be benign hemorrhagic or proteinaceous cyst -  Renal ultrasound relatively benign--no further w/u  History of heart block s/p pacemaker - Stable  Hypertension  -hold Losartan due to AKI -Metoprolol DC by cardiology  Hyperlipidemia -continue statin  DVT prophylaxis: Eliquis Code Status: Full Family Communication: Discussed with husband at the bedside 5/20.  Status is: Inpatient  Dispo: The patient is from: Home              Anticipated d/c is to: Awaiting PT eval appreciate them seeing patient today              Anticipated d/c date is: Defer to cardiology feasibility of discharge Physical therapy has seen the patient last 5/27 recommending wheelchair 3 and 1 and home health supervision PT and OT              Patient currently not medically stable for discharge  Consultants:   None  Procedures:   None indicated  Antimicrobials:  None indicated  Subjective:  Looks improved Getting her hair braided by daughter No chest pain  Objective: Vitals:   03/31/21 1600 03/31/21 2004 03/31/21 2342 04/01/21 0420  BP: (!) 97/58 106/66 (!) 95/51 99/64  Pulse:  65 61 68  Resp:  '15 18 20  '$ Temp:  99 F (37.2 C) 98.4 F (36.9  C) 98.6 F (37 C)  TempSrc:  Oral Oral Oral  SpO2:  100% 100% 99%  Weight:    53.3 kg  Height:        Intake/Output Summary (Last 24 hours) at 04/01/2021 1023 Last data filed at 04/01/2021 0420 Gross per 24 hour  Intake 527.18 ml  Output 500 ml  Net 27.18 ml   Filed Weights   03/30/21 0617 03/31/21 0622 04/01/21 0420  Weight: 53.1 kg 53.3 kg 53.3 kg    Examination:  Edentulous no distress Cannot appreciate JVD as she is sitting Once again Abdomen is soft Mild lower extremity edema Coherent and not on oxygen  Data Reviewed: I have personally reviewed following labs and imaging studies  CBC: Recent Labs  Lab 03/26/21 1523 03/31/21 0429 04/01/21 0412  WBC 4.3 4.5 4.4  NEUTROABS 2.5  --   --   HGB 11.1* 11.1* 10.3*  HCT 34.0* 34.1* 32.2*  MCV 85.0 86.3 86.3  PLT 223 201 [id number]   Basic Metabolic Panel: Recent Labs  Lab 03/27/21 0445 03/28/21 0857 03/30/21 0738 03/31/21 0429 04/01/21 0412  NA 135 135 135 135 134*  K 4.1 3.6 3.4* 3.6 3.5  CL 107 104 101 103 100  CO2 17* 19* '23 26 27  '$ GLUCOSE 95 106* 85 93 98  BUN 75* 73* 54* 46* 38*  CREATININE 2.23* 2.26* 1.78* 1.56* 1.59*  CALCIUM 10.8* 10.7* 10.7* 10.9* 10.7*  MG  --   --   --  2.0 1.9  PHOS  --   --   --  2.8  --    GFR: Estimated Creatinine Clearance: 23.7 mL/min (A) (by C-G formula based on SCr of 1.59 mg/dL (H)). Liver Function Tests: Recent Labs  Lab 03/26/21 1523 03/28/21 0857 03/30/21 0738 03/31/21 0429 04/01/21 0412  AST 28 38 28  --  20  ALT 26 34 28  --  19  ALKPHOS 46 45 40  --  35*  BILITOT 1.8* 1.7* 1.3*  --  1.1  PROT 7.6 7.0 6.9  --  6.6  ALBUMIN 3.5 3.1* 3.0* 2.8* 2.8*   Recent Labs  Lab 03/26/21 1523  LIPASE 46   No results for input(s): AMMONIA in the last 168 hours. Coagulation Profile: No results for input(s): INR, PROTIME in the last 168 hours. Cardiac Enzymes: No results for input(s): CKTOTAL, CKMB, CKMBINDEX, TROPONINI in the last 168 hours. BNP (last 3  results) Recent Labs    07/20/20 1201  PROBNP 9,907*   HbA1C: No results for input(s): HGBA1C in the last 72 hours. CBG: No results for input(s): GLUCAP in the last 168 hours. Lipid Profile: No results for input(s): CHOL, HDL, LDLCALC, TRIG, CHOLHDL, LDLDIRECT in the last 72 hours. Thyroid Function Tests: No results for input(s): TSH, T4TOTAL, FREET4, T3FREE, THYROIDAB in the last 72 hours. Anemia Panel: No results for input(s): VITAMINB12, FOLATE, FERRITIN, TIBC, IRON, RETICCTPCT in the last 72 hours. Sepsis Labs: Recent Labs  Lab 03/30/21 0322 03/30/21 0738  LATICACIDVEN 1.2 1.3    Recent Results (from the past 240 hour(s))  Resp Panel by RT-PCR (Flu A&B, Covid) Nasopharyngeal Swab     Status: None   Collection  Time: 03/26/21 11:43 PM   Specimen: Nasopharyngeal Swab; Nasopharyngeal(NP) swabs in vial transport medium  Result Value Ref Range Status   SARS Coronavirus 2 by RT PCR NEGATIVE NEGATIVE Final    Comment: (NOTE) SARS-CoV-2 target nucleic acids are NOT DETECTED.  The SARS-CoV-2 RNA is generally detectable in upper respiratory specimens during the acute phase of infection. The lowest concentration of SARS-CoV-2 viral copies this assay can detect is 138 copies/mL. A negative result does not preclude SARS-Cov-2 infection and should not be used as the sole basis for treatment or other patient management decisions. A negative result may occur with  improper specimen collection/handling, submission of specimen other than nasopharyngeal swab, presence of viral mutation(s) within the areas targeted by this assay, and inadequate number of viral copies(<138 copies/mL). A negative result must be combined with clinical observations, patient history, and epidemiological information. The expected result is Negative.  Fact Sheet for Patients:  EntrepreneurPulse.com.au  Fact Sheet for Healthcare Providers:  IncredibleEmployment.be  This  test is no t yet approved or cleared by the [place] FDA and  has been authorized for detection and/or diagnosis of SARS-CoV-2 by FDA under an Emergency Use Authorization (EUA). This EUA will remain  in effect (meaning this test can be used) for the duration of the COVID-19 declaration under Section 564(b)(1) of the Act, 21 U.S.C.section 360bbb-3(b)(1), unless the authorization is terminated  or revoked sooner.       Influenza A by PCR NEGATIVE NEGATIVE Final   Influenza B by PCR NEGATIVE NEGATIVE Final    Comment: (NOTE) The Xpert Xpress SARS-CoV-2/FLU/RSV plus assay is intended as an aid in the diagnosis of influenza from Nasopharyngeal swab specimens and should not be used as a sole basis for treatment. Nasal washings and aspirates are unacceptable for Xpert Xpress SARS-CoV-2/FLU/RSV testing.  Fact Sheet for Patients: EntrepreneurPulse.com.au  Fact Sheet for Healthcare Providers: IncredibleEmployment.be  This test is not yet approved or cleared by the [place] FDA and has been authorized for detection and/or diagnosis of SARS-CoV-2 by FDA under an Emergency Use Authorization (EUA). This EUA will remain in effect (meaning this test can be used) for the duration of the COVID-19 declaration under Section 564(b)(1) of the Act, 21 U.S.C. section 360bbb-3(b)(1), unless the authorization is terminated or revoked.  Performed at [facility], [place] [address] [place], [place] [address]          Radiology Studies: No results found. Scheduled Meds: . apixaban  2.5 mg Oral BID  . vitamin C  1,000 mg Oral Daily  . Chlorhexidine Gluconate Cloth  6 each Topical Daily  . feeding supplement  237 mL Oral BID BM  . multivitamin with minerals  1 tablet Oral Daily  . polyethylene glycol  17 g Oral Daily  . rosuvastatin  10 mg Oral QPM  . senna-docusate  1 tablet Oral QHS  . sodium chloride flush  10-40 mL Intracatheter Q12H  .  [START ON 04/02/2021] torsemide  40 mg Oral Daily   Continuous Infusions: . DOBUTamine 2.5 mcg/kg/min (03/30/21 1806)     LOS: 5 days   Time spent: 15 minutes  Verneita Griffes, MD [facility] 10:23 AM   If 7PM-7AM, please contact night-coverage [email or web address]  04/01/2021, 10:23 AM

## 2021-04-01 NOTE — Plan of Care (Signed)

## 2021-04-02 DIAGNOSIS — I429 Cardiomyopathy, unspecified: Secondary | ICD-10-CM | POA: Diagnosis not present

## 2021-04-02 DIAGNOSIS — R531 Weakness: Secondary | ICD-10-CM | POA: Diagnosis not present

## 2021-04-02 DIAGNOSIS — N179 Acute kidney failure, unspecified: Secondary | ICD-10-CM | POA: Diagnosis not present

## 2021-04-02 DIAGNOSIS — Z515 Encounter for palliative care: Secondary | ICD-10-CM | POA: Diagnosis not present

## 2021-04-02 DIAGNOSIS — Z950 Presence of cardiac pacemaker: Secondary | ICD-10-CM | POA: Diagnosis not present

## 2021-04-02 DIAGNOSIS — I480 Paroxysmal atrial fibrillation: Secondary | ICD-10-CM

## 2021-04-02 DIAGNOSIS — I5023 Acute on chronic systolic (congestive) heart failure: Secondary | ICD-10-CM | POA: Diagnosis not present

## 2021-04-02 DIAGNOSIS — I2510 Atherosclerotic heart disease of native coronary artery without angina pectoris: Secondary | ICD-10-CM

## 2021-04-02 DIAGNOSIS — I5021 Acute systolic (congestive) heart failure: Secondary | ICD-10-CM

## 2021-04-02 LAB — BASIC METABOLIC PANEL
Anion gap: 7 (ref 5–15)
BUN: 30 mg/dL — ABNORMAL HIGH (ref 8–23)
CO2: 25 mmol/L (ref 22–32)
Calcium: 10.9 mg/dL — ABNORMAL HIGH (ref 8.9–10.3)
Chloride: 102 mmol/L (ref 98–111)
Creatinine, Ser: 1.52 mg/dL — ABNORMAL HIGH (ref 0.44–1.00)
GFR, Estimated: 34 mL/min — ABNORMAL LOW (ref >60)
Glucose, Bld: 91 mg/dL (ref 70–99)
Potassium: 4.3 mmol/L (ref 3.5–5.1)
Sodium: 134 mmol/L — ABNORMAL LOW (ref 135–145)

## 2021-04-02 LAB — CBC
HCT: 30.9 % — ABNORMAL LOW (ref 36.0–46.0)
Hemoglobin: 9.9 g/dL — ABNORMAL LOW (ref 12.0–15.0)
MCH: 27.8 pg (ref 26.0–34.0)
MCHC: 32 g/dL (ref 30.0–36.0)
MCV: 86.8 fL (ref 80.0–100.0)
Platelets: 166 10*3/uL (ref 150–400)
RBC: 3.56 MIL/uL — ABNORMAL LOW (ref 3.87–5.11)
RDW: 16.3 % — ABNORMAL HIGH (ref 11.5–15.5)
WBC: 4.3 10*3/uL (ref 4.0–10.5)
nRBC: 0 % (ref 0.0–0.2)

## 2021-04-02 LAB — COOXEMETRY PANEL
Carboxyhemoglobin: 1.5 % (ref 0.5–1.5)
Methemoglobin: 0.8 % (ref 0.0–1.5)
O2 Saturation: 69.3 %
Total hemoglobin: 10.1 g/dL — ABNORMAL LOW (ref 12.0–16.0)

## 2021-04-02 LAB — MAGNESIUM: Magnesium: 2 mg/dL (ref 1.7–2.4)

## 2021-04-02 MED ORDER — HEPARIN (PORCINE) 25000 UT/250ML-% IV SOLN
800.0000 [IU]/h | INTRAVENOUS | Status: AC
  Administered 2021-04-03: 600 [IU]/h via INTRAVENOUS
  Administered 2021-04-04: 750 [IU]/h via INTRAVENOUS
  Filled 2021-04-02 (×2): qty 250

## 2021-04-02 NOTE — Consult Note (Addendum)
Electrophysiology Consultation:   Patient ID: GIBSON GATTA MRN: [id number]; DOB: 1941/03/27  Admit date: 03/26/2021 Date of Consult: 04/02/2021  PCP:  Merrilee Seashore, MD   [facility] HeartCare Providers  Electrophysiologist:  Vickie Epley, MD  {  Patient Profile:   MURPHIE LACHICA is a 80 y.o. female with a hx of HTN, CHB s/p PPM and chronic systolic heart failure who is being seen 04/02/2021 for the evaluation of acute on chronic systolic heart failure at the request of Dr Haroldine Laws. I previously saw Ms Grealish at the end of 2021 for her cardiomyopathy and recommended upgrade of her system to a CRT-P system given her high RV pacing burden. Unfortunately she declined upgrade at that time and for the past few months she has experienced worsening breathing and more fatigue. She presented this time to the hospital with cardiogenic shock and has required dobutamine for hemodynamic support. Since arriving she has stabilized on dobutamine. Her renal function has improved. Her breathing is stable. She tells me that she is able to lay flat.  I spoke with the patient's older daughter today on speakerphone while the patient was present. I also discussed the case with Dr Haroldine Laws today regarding her care.  Past Medical History:  Diagnosis Date  . AV block, 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[place]) 07/15/2017  . Bronchitis   . Encounter for care of pacemaker 05/12/2019  . Hypertension   . Pacemaker - MRI safe Medtronic Azure XT DR MRI [id number] Dual chamber PPM in situ 04/24/2019    Past Surgical History:  Procedure Laterality Date  . LEFT HEART CATH AND CORONARY ANGIOGRAPHY N/A 11/11/2017   Procedure: LEFT HEART CATH AND CORONARY ANGIOGRAPHY;  Surgeon: Nigel Mormon, MD;  Location: [facility];  Service: Cardiovascular;  Laterality: N/A;  . left leg surgery    . PACEMAKER IMPLANT N/A 07/15/2017   Procedure: Pacemaker Implant;  Surgeon: Constance Haw, MD;  Location: [place] [facility];  Service:  Cardiovascular;  Laterality: N/A;     Home Medications:  Prior to Admission medications   Medication Sig Start Date End Date Taking? Authorizing Provider  ALPRAZolam Duanne Moron) 0.25 MG tablet Take 1 tablet by mouth daily as needed for anxiety.  01/14/19  Yes [provider]  apixaban (ELIQUIS) 2.5 MG TABS tablet Take 1 tablet by mouth in the morning and at bedtime.   Yes [provider]  Ascorbic Acid (VITAMIN C) 1000 MG tablet Take 1,000 mg by mouth daily.   Yes [provider]  clobetasol ointment (TEMOVATE) [id number] % Apply 1 application topically 2 (two) times daily as needed (itching).  08/02/17  Yes [provider]  clotrimazole-betamethasone (LOTRISONE) cream 1 application in the morning and at bedtime. 01/16/17  Yes [provider]  EUCRISA 2 % OINT Apply 1 application topically daily as needed (dry skin).  01/14/19  Yes [provider]  losartan (COZAAR) 25 MG tablet TAKE 1 TABLET BY MOUTH EVERY DAY Patient taking differently: Take 25 mg by mouth daily. 01/29/21  Yes Patwardhan, Manish J, MD  metoprolol succinate (TOPROL-XL) 50 MG 24 hr tablet Take 1 tablet (50 mg total) by mouth daily. Take with or immediately following a meal. 04/21/20  Yes Patwardhan, Manish J, MD  nitroGLYCERIN (NITROSTAT) 0.4 MG SL tablet Place 1 tablet (0.4 mg total) under the tongue every 5 (five) minutes as needed for chest pain. 09/23/19 09/13/20 Yes Patwardhan, Manish J, MD  OLOPATADINE HCL OP Place 1 drop into both eyes 2 (two) times  daily.   Yes [provider]  rosuvastatin (CRESTOR) 10 MG tablet TAKE 1 TABLET BY MOUTH EVERY DAY Patient taking differently: Take 10 mg by mouth every evening. 03/20/21  Yes Patwardhan, Manish J, MD  spironolactone (ALDACTONE) 25 MG tablet Take 1 tablet (25 mg total) by mouth every other day. 01/10/21 04/10/21 Yes Patwardhan, Manish J, MD  torsemide (DEMADEX) 20 MG tablet Daily Patient taking differently: Take 20 mg by mouth daily.  02/02/21  Yes Patwardhan, Manish J, MD  triamcinolone cream (KENALOG) 0.1 % Apply topically 2 (two) times daily as needed. 06/01/20  Yes [provider]    Inpatient Medications: Scheduled Meds: . apixaban  2.5 mg Oral BID  . vitamin C  1,000 mg Oral Daily  . Chlorhexidine Gluconate Cloth  6 each Topical Daily  . feeding supplement  237 mL Oral BID BM  . multivitamin with minerals  1 tablet Oral Daily  . polyethylene glycol  17 g Oral Daily  . rosuvastatin  10 mg Oral QPM  . senna-docusate  1 tablet Oral QHS  . sodium chloride flush  10-40 mL Intracatheter Q12H  . torsemide  40 mg Oral Daily   Continuous Infusions: . DOBUTamine 2.5 mcg/kg/min (04/02/21 0244)   PRN Meds: ALPRAZolam, sodium chloride flush  Allergies:    Allergies  Allergen Reactions  . Entresto [Sacubitril-Valsartan] Itching, Rash and Cough  . Lisinopril Cough  . Venlafaxine Rash    Social History:   Social History   Socioeconomic History  . Marital status: Divorced    Spouse name: Not on file  . Number of children: 7  . Years of education: Not on file  . Highest education level: Not on file  Occupational History  . Not on file  Tobacco Use  . Smoking status: Never Smoker  . Smokeless tobacco: Never Used  Vaping Use  . Vaping Use: Never used  Substance and Sexual Activity  . Alcohol use: No  . Drug use: Never  . Sexual activity: Not on file  Other Topics Concern  . Not on file  Social History Narrative  . Not on file   Social Determinants of Health   Financial Resource Strain: Medium Risk  . Difficulty of Paying Living Expenses: Somewhat hard  Food Insecurity: No Food Insecurity  . Worried About Charity fundraiser in the Last Year: Never true  . Ran Out of Food in the Last Year: Never true  Transportation Needs: No Transportation Needs  . Lack of Transportation (Medical): No  . Lack of Transportation (Non-Medical): No  Physical Activity: Not on file  Stress: Not on file  Social  Connections: Not on file  Intimate Partner Violence: Not on file    Family History:    Family History  Problem Relation Age of Onset  . Hypertension Mother   . ALS Sister   . Heart attack Brother   . Diabetes Daughter      ROS:  Please see the history of present illness.   All other ROS reviewed and negative.     Physical Exam/Data:   Vitals:   04/02/21 0731 04/02/21 0732 04/02/21 1056 04/02/21 1546  BP:  94/61 (!) 100/57 (!) 100/57  Pulse:  (!) 59  67  Resp:  19  16  Temp: 98.6 F (37 C)  (!) 97.5 F (36.4 C) 98.7 F (37.1 C)  TempSrc: Oral  Oral Oral  SpO2:  99%  100%  Weight:      Height:  Intake/Output Summary (Last 24 hours) at 04/02/2021 1621 Last data filed at 04/02/2021 0851 Gross per 24 hour  Intake 544.89 ml  Output 635 ml  Net -90.11 ml   Last 3 Weights 04/02/2021 04/01/2021 03/31/2021  Weight (lbs) 118 lb 13.3 oz 117 lb 9.6 oz 117 lb 6.4 oz  Weight (kg) 53.9 kg 53.343 kg 53.252 kg     Body mass index is 20.4 kg/m.  General: Chronically ill appearing, thin. HEENT: normal Lymph: no adenopathy Neck: no JVD Endocrine:  No thryomegaly Vascular: No carotid bruits; FA pulses 2+ bilaterally without bruits  Cardiac:  normal S1, S2; RRR; soft murmur (systolic)  Lungs:  clear to auscultation bilaterally, no wheezing, rhonchi or rales  Abd: soft, nontender, no hepatomegaly  Ext: no edema Musculoskeletal:  No deformities, BUE and BLE strength normal and equal Skin: warm and dry  Neuro:  CNs 2-12 intact, no focal abnormalities noted Psych:  Normal affect   EKG:  The EKG was personally reviewed and demonstrates:  RV pacing with very wide LBBB QRS morphology approaching 220m.   Telemetry:  Telemetry was personally reviewed and demonstrates:  RV pacing  Relevant CV Studies:  03/27/2021 Echo personally reviewed Dilated LV LV function severely decreased, <20% Severe MR Severe TR     Laboratory Data:  High Sensitivity Troponin:  No results for  input(s): TROPONINIHS in the last 720 hours.   Chemistry Recent Labs  Lab 04/01/21 0412 04/01/21 2043 04/02/21 0440  NA 134* 133* 134*  K 3.5 4.5 4.3  CL 100 100 102  CO2 '27 27 25  '$ GLUCOSE 98 137* 91  BUN 38* 33* 30*  CREATININE 1.59* 1.60* 1.52*  CALCIUM 10.7* 11.0* 10.9*  GFRNONAA 33* 32* 34*  ANIONGAP '7 6 7    '$ Recent Labs  Lab 03/28/21 0857 03/30/21 0738 03/31/21 0429 04/01/21 0412  PROT 7.0 6.9  --  6.6  ALBUMIN 3.1* 3.0* 2.8* 2.8*  AST 38 28  --  20  ALT 34 28  --  19  ALKPHOS 45 40  --  35*  BILITOT 1.7* 1.3*  --  1.1   Hematology Recent Labs  Lab 03/31/21 0429 04/01/21 0412 04/02/21 0440  WBC 4.5 4.4 4.3  RBC 3.95 3.73* 3.56*  HGB 11.1* 10.3* 9.9*  HCT 34.1* 32.2* 30.9*  MCV 86.3 86.3 86.8  MCH 28.1 27.6 27.8  MCHC 32.6 32.0 32.0  RDW 16.1* 16.2* 16.3*  PLT 201 174 166   BNP Recent Labs  Lab 03/30/21 0738  BNP >4,500.0*    DDimer No results for input(s): DDIMER in the last 168 hours.   Radiology/Studies:  DG Chest 1 View  Result Date: 03/30/2021 CLINICAL DATA:  Heart failure.  Cardiac pacemaker. EXAM: CHEST  1 VIEW COMPARISON:  04/24/2020 FINDINGS: Enlargement of the cardiac silhouette. There is a left-sided dual chamber cardiac pacemaker. S-shaped scoliosis in the thoracic spine. Linear density in the right lower chest is most compatible with a Mach line from overlying soft tissues. Lungs are clear without pulmonary edema. Severe degenerative changes at the right shoulder. Abnormal appearance of the left humeral head appears chronic. Atherosclerotic calcifications at the aortic arch. IMPRESSION: 1. Cardiomegaly without focal lung disease or overt pulmonary edema. 2. Stable appearance of the cardiac pacemaker. 3. Scoliosis. 4. Chronic changes in both shoulders. Electronically Signed   By: AMarkus DaftM.D.   On: 03/30/2021 10:34   [place]EKG SITE RITE  Result Date: 03/30/2021 If [facility]ex[facility] Incimage not attached, placement could not be  confirmed due to  current cardiac rhythm.    Assessment and Plan:   1. Acutely decompensated systolic heart failure NYHA IV, warm and euvolemic Requiring dobutamine 2.5 EF<20  I suspect her worsening cardiomyopathy is being driven by her RV pacing. Unfortunately the upgrade of her system has been delayed. It is unclear to me at this point whether or not her LV function can improve with the addition of a CS lead but the only real way to know would be to trial biV pacing. I have discussed that procedure in detail with the patient and her daughter on the phone today and they would like to proceed. I explained that this upgrade requires the L subclavian system to be open. She is not a candidate for an extraction/upgrade if the L side were to be occluded. She is also not a candidate for a right sided implant with tunneling to the left. She is not a good candidate for sedation so we have discussed performing the procedure under local anesthesia only and she is agreeable. I discussed the possibility that the CS could be occluded or the possibility that CS branches could be in suboptimal locations.  We will tentatively plan for a Thursday procedure. Please keep NPO after MN Wednesday night.  Hold apixaban. Change to heparin gtt and stop heparin at MN Wednesday night.  2. pAF Stop apixaban Heparin gtt. Will not resume heparin gtt after biV upgrade. Will wait to restart apixaban until 5 days after biV upgrade.  Following.   For questions or updates, please contact [place] Please consult [email or web address] for contact info under    Signed, Vickie Epley, MD  04/02/2021 4:21 PM

## 2021-04-02 NOTE — Progress Notes (Signed)
Advanced Heart Failure Rounding Note   Subjective:    Remains on DBA at 2.5. Co-ox 69%  On torsemide 40 daily  Denies SOB, orthopnea or PND. Creatinine stable at 1.5  Objective:   Weight Range:  Vital Signs:   Temp:  [97.9 F (36.6 C)-98.7 F (37.1 C)] 98.6 F (37 C) (05/30 0731) Pulse Rate:  [61-72] 61 (05/30 0400) Resp:  [14-20] 14 (05/30 0400) BP: (99-108)/(50-65) 108/55 (05/30 0400) SpO2:  [94 %-100 %] 99 % (05/30 0400) Weight:  [53.9 kg] 53.9 kg (05/30 0400) Last BM Date: 03/31/21  Weight change: Filed Weights   03/31/21 0622 04/01/21 0420 04/02/21 0400  Weight: 53.3 kg 53.3 kg 53.9 kg    Intake/Output:   Intake/Output Summary (Last 24 hours) at 04/02/2021 0857 Last data filed at 04/02/2021 0851 Gross per 24 hour  Intake 784.89 ml  Output 935 ml  Net -150.11 ml     Physical Exam: General:  Thin elderly. Weak appearing. No resp difficulty HEENT: normal Neck: supple. no JVD. Carotids 2+ bilat; no bruits. No lymphadenopathy or thryomegaly appreciated. Cor: PMI nondisplaced. Regular rate & rhythm. 2/6 MR Lungs: clear Abdomen: soft, nontender, nondistended. No hepatosplenomegaly. No bruits or masses. Good bowel sounds. Extremities: no cyanosis, clubbing, rash, edema Neuro: alert & orientedx3, cranial nerves grossly intact. moves all 4 extremities w/o difficulty. Affect pleasant   Telemetry: AV paced 70s Personally reviewed  Labs: Basic Metabolic Panel: Recent Labs  Lab 03/30/21 0738 03/31/21 0429 04/01/21 0412 04/01/21 2043 04/02/21 0440  NA 135 135 134* 133* 134*  K 3.4* 3.6 3.5 4.5 4.3  CL 101 103 100 100 102  CO2 '23 26 27 27 25  '$ GLUCOSE 85 93 98 137* 91  BUN 54* 46* 38* 33* 30*  CREATININE 1.78* 1.56* 1.59* 1.60* 1.52*  CALCIUM 10.7* 10.9* 10.7* 11.0* 10.9*  MG  --  2.0 1.9  --  2.0  PHOS  --  2.8  --   --   --     Liver Function Tests: Recent Labs  Lab 03/26/21 1523 03/28/21 0857 03/30/21 0738 03/31/21 0429 04/01/21 0412  AST  28 38 28  --  20  ALT 26 34 28  --  19  ALKPHOS 46 45 40  --  35*  BILITOT 1.8* 1.7* 1.3*  --  1.1  PROT 7.6 7.0 6.9  --  6.6  ALBUMIN 3.5 3.1* 3.0* 2.8* 2.8*   Recent Labs  Lab 03/26/21 1523  LIPASE 46   No results for input(s): AMMONIA in the last 168 hours.  CBC: Recent Labs  Lab 03/26/21 1523 03/31/21 0429 04/01/21 0412 04/02/21 0440  WBC 4.3 4.5 4.4 4.3  NEUTROABS 2.5  --   --   --   HGB 11.1* 11.1* 10.3* 9.9*  HCT 34.0* 34.1* 32.2* 30.9*  MCV 85.0 86.3 86.3 86.8  PLT 223 201 174 166    Cardiac Enzymes: No results for input(s): CKTOTAL, CKMB, CKMBINDEX, TROPONINI in the last 168 hours.  BNP: BNP (last 3 results) Recent Labs    06/05/20 1311 03/26/21 1523 03/30/21 0738  BNP 1,854.5* >4,500.0* >4,500.0*    ProBNP (last 3 results) Recent Labs    07/20/20 1201  PROBNP 9,907*      Other results:  Imaging: No results found.   Medications:     Scheduled Medications: . apixaban  2.5 mg Oral BID  . vitamin C  1,000 mg Oral Daily  . Chlorhexidine Gluconate Cloth  6 each Topical Daily  .  feeding supplement  237 mL Oral BID BM  . multivitamin with minerals  1 tablet Oral Daily  . polyethylene glycol  17 g Oral Daily  . rosuvastatin  10 mg Oral QPM  . senna-docusate  1 tablet Oral QHS  . sodium chloride flush  10-40 mL Intracatheter Q12H  . torsemide  40 mg Oral Daily    Infusions: . DOBUTamine 2.5 mcg/kg/min (04/02/21 0244)    PRN Medications: ALPRAZolam, sodium chloride flush   Assessment/Plan:   1. 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 Pt previously declined upgrade to CRT-P - Echo 07/15/2017: EF 65-70%, RV normal (PPM implanted 07/2017) - Echo 04/04/2020: EF 25-30%, RV normal  - Echo 03/27/2021: EF < 20%, global HK. RV normal. Severe MR  - She has end-stage CM and low output likely from chronic RV pacing - Co-ox improved on DBA 2.5 at 69% . Volume status ok on po  torsemide - She has deteriorated quite a bit over last few months. Will plan support with DBA and see how much she improves. If we can stabilize her for a while may be able to reconsider CRT upgrade but will not be a salvage therapy. Weight stable. - Long talk with her and 2 daughters about situation and options regarding CRT-P upgrade on 5/29 - I d/w Dr. Quentin Ore about possible candidacy/timing of potential CRT-P upgrade. Suspect this is just about as good as we can get her. Without CRT-P upgrade suspect mortality is 50-50 at 6 months given how severe her HF was on admit. Dr. Quentin Ore to see today  2. AKI - prior SCr from 10/21 was 1.4 - 2.1>>2.3. Now stabilized at 1.5  3. Mitral Regurgitation  - severe on Echo, likely functional   4. CHB - s/p PPM   5. Hypokalemia - K 4.3   6. CAD - records indicate mod LCx disease.  - No s/s angina   Length of Stay: 6  Glori Bickers MD 04/02/2021, 8:57 AM  Advanced Heart Failure Team Pager [phone] (M-F; 7a - 4p)  Please contact [facility] for night-coverage after hours (4p -7a ) and weekends on [email or web address]

## 2021-04-02 NOTE — Progress Notes (Signed)
Occupational Therapy Treatment Patient Details Name: Holly [name] MRN: [id number] DOB: 1941/01/23 Today's Date: 04/02/2021    History of present illness Pt is an 80 y/o female admitted 5/23 secondary to abnormal labs and AKI. 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 PMH includes a fib, CHF, s/p pacemaker, HTN, and CKD.   OT comments  Pt progressing towards OT goals and motivated to return home. Pt able to demo pivot transfers using RW at min guard without LOB or safety concerns. Pt continues to require assist with LB dressing tasks at acute level, reports typically sitting on low bed for improved ability to reach B feet at home. Guided pt in pill box test to further assess executive functioning and reasoning skills for IADLs. Ultimately, pt unable to follow label directions appropriately or complete pill box test. Pt has a pill box at home though does not typically use. Pt does endorse forgetting to take medications (though not often per pt). Encouraged pt to begin routine with pill box sorter to maximize safety/carryover. Encouraged family assist with this task as well.    Follow Up Recommendations  Home health OT;Supervision - Intermittent;Other (comment) (Parkin aide)    Equipment Recommendations  None recommended by OT    Recommendations for Other Services      Precautions / Restrictions Precautions Precautions: Fall Restrictions Weight Bearing Restrictions: No       Mobility Bed Mobility Overal bed mobility: Needs Assistance Bed Mobility: Supine to Sit     Supine to sit: Min assist;HOB elevated     General bed mobility comments: light Min A to advance trunk to EOB    Transfers Overall transfer level: Needs assistance Equipment used: Rolling walker (2 wheeled) Transfers: Sit to/from [facility] Sit to Stand: Min guard Stand pivot transfers: Min guard       General transfer comment: min guard for safety, kyphotic posture though no LOB    Balance Overall  balance assessment: Needs assistance Sitting-balance support: No upper extremity supported;Feet supported Sitting balance-Leahy Scale: Fair Sitting balance - Comments: kyphotic posture; able to reach down and adjust socks without difficulty.   Standing balance support: During functional activity Standing balance-Leahy Scale: Poor Standing balance comment: Reliant on BUE support                           ADL either performed or assessed with clinical judgement   ADL Overall ADL's : Needs assistance/impaired                     Lower Body Dressing: Moderate assistance;Sit to/from stand Lower Body Dressing Details (indicate cue type and reason): Max A to don socks, reports sitting on low bed at home to be able to reach feet Toilet Transfer: Min Insurance claims handler Details (indicate cue type and reason): simulated to recliner           General ADL Comments: Session focused on pill box test for further executive functioning and reasoning assessment. pt with difficulty opening containers and following instructions on labels. Pt reports typically keeping bottles in a bin, taking medication straight from bottle (twice a day). Pt does endorse forgetting to take medication at times. Pt has a pill box at home that she does not use, encouraged trial of this to maximize safety/carryover with meds     Vision   Vision Assessment?: No apparent visual deficits   Perception     Praxis      Cognition  Arousal/Alertness: Awake/alert Behavior During Therapy: WFL for tasks assessed/performed Overall Cognitive Status: No family/caregiver present to determine baseline cognitive functioning                                 General Comments: Likely close to baseline. WFL for basic tasks.  A and O X4. a lot of difficulty with pill box test today, deficits in executive functioning and problem solving to accurately sort medications/follow label  instructions        Exercises     Shoulder Instructions       General Comments VSS on RA    Pertinent Vitals/ Pain       Pain Assessment: No/denies pain Faces Pain Scale: Hurts little more Pain Location: back Pain Descriptors / Indicators: Aching Pain Intervention(s): Monitored during session  Home Living                                          Prior Functioning/Environment              Frequency  Min 2X/week        Progress Toward Goals  OT Goals(current goals can now be found in the care plan section)  Progress towards OT goals: Progressing toward goals  Acute Rehab OT Goals Patient Stated Goal: be able to go home soon OT Goal Formulation: With patient Time For Goal Achievement: 04/13/21 Potential to Achieve Goals: Fair ADL Goals Pt Will Perform Grooming: with set-up;sitting;standing Pt Will Perform Upper Body Bathing: with set-up;sitting Pt Will Perform Lower Body Bathing: with supervision;sit to/from stand Pt Will Perform Upper Body Dressing: with set-up;sitting Pt Will Perform Lower Body Dressing: with supervision;sit to/from stand Pt Will Transfer to Toilet: with supervision;ambulating;regular height toilet Pt Will Perform Toileting - Clothing Manipulation and hygiene: with supervision;sit to/from stand  Plan Discharge plan remains appropriate    Co-evaluation                 AM-PAC OT "6 Clicks" Daily Activity     Outcome Measure   Help from another person eating meals?: None Help from another person taking care of personal grooming?: A Little Help from another person toileting, which includes using toliet, bedpan, or urinal?: A Little Help from another person bathing (including washing, rinsing, drying)?: A Lot Help from another person to put on and taking off regular upper body clothing?: A Little Help from another person to put on and taking off regular lower body clothing?: A Lot 6 Click Score: 17    End of  Session Equipment Utilized During Treatment: Rolling walker  OT Visit Diagnosis: Unsteadiness on feet (R26.81);Pain;Muscle weakness (generalized) (M62.81) Pain - Right/Left: Left Pain - part of body: Shoulder   Activity Tolerance Patient tolerated treatment well   Patient Left in chair;with call bell/phone within reach;with chair alarm set   Nurse Communication Mobility status        Time: [id number] OT Time Calculation (min): 29 min  Charges: OT General Charges $OT Visit: 1 Visit OT Treatments $Self Care/Home Management : 8-22 mins $Therapeutic Activity: 8-22 mins  Malachy Chamber, OTR/L Acute Rehab Services Office: [phone]   Layla Maw 04/02/2021, 2:40 PM

## 2021-04-02 NOTE — Care Management Important Message (Signed)
Important Message  Patient Details  Name: Holly [name] MRN: [id number] Date of Birth: 03-07-1941   Medicare Important Message Given:  Yes     Shelda Altes 04/02/2021, 8:46 AM

## 2021-04-02 NOTE — Progress Notes (Signed)
Physical Therapy Treatment Patient Details Name: Holly [name] MRN: [id number] DOB: 12-Jul-1941 Today's Date: 04/02/2021    History of Present Illness Pt is an 80 y/o female admitted 5/23 secondary to abnormal labs and AKI. 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 PMH includes a fib, CHF, s/p pacemaker, HTN, and CKD.    PT Comments    Patient progressing well towards PT goals. Improved ambulation distance with Min guard-Min A and use of RW for support. Requires 1 seated rest break due to fatigue. Better able to bring hands on walker handles today on second bout of ambulation but limited due to chronic back pain and leg length discrepancy. VSS on RA. Able to perform pericare after use of BSC without difficulty. Eager to stay strong while in the hospital. Will continue to follow and progress as tolerated.   Follow Up Recommendations  Home health PT;Supervision for mobility/OOB     Equipment Recommendations  Wheelchair (measurements PT);Wheelchair cushion (measurements PT);3in1 (PT)    Recommendations for Other Services       Precautions / Restrictions Precautions Precautions: Fall Restrictions Weight Bearing Restrictions: No    Mobility  Bed Mobility               General bed mobility comments: Up in chair upon PT arrival.    Transfers Overall transfer level: Needs assistance Equipment used: Rolling walker (2 wheeled) Transfers: Sit to/from [facility] Sit to Stand: Min assist Stand pivot transfers: Min assist;Min guard       General transfer comment: Assist to power to standing from chair x3, from [facility] x1, SPT chair to/from [facility] x1, stood from [facility] without therapist in room and got back to chair. Flexed at hips/knees/trunk.  Ambulation/Gait Ambulation/Gait assistance: Min guard Gait Distance (Feet): 24 Feet (x2 bouts) Assistive device: Rolling walker (2 wheeled) Gait Pattern/deviations: Narrow base of support;Step-through pattern;Decreased step length -  right;Decreased step length - left;Decreased dorsiflexion - right;Decreased dorsiflexion - left Gait velocity: decreased   General Gait Details: Slow, mildly unsteady gait with narrow BoS and decreased foot clearance witih pt leaning with forearms on walker handles for first bout and then able to use hands on walker handles with cues for second bout; increased knee/trunk flexion throughout. Pt with leg length discrepancy. 1 seated rest break. VSS on RA.   Stairs             Wheelchair Mobility    Modified Rankin (Stroke Patients Only)       Balance Overall balance assessment: Needs assistance Sitting-balance support: No upper extremity supported;Feet supported Sitting balance-Leahy Scale: Fair Sitting balance - Comments: kyphotic posture; able to reach down and adjust socks without difficulty.   Standing balance support: During functional activity Standing balance-Leahy Scale: Poor Standing balance comment: Reliant on BUE support with forearms leaning on walker handles due to inability to stand upright secondary to back pain.                            Cognition Arousal/Alertness: Awake/alert Behavior During Therapy: WFL for tasks assessed/performed Overall Cognitive Status: No family/caregiver present to determine baseline cognitive functioning                                 General Comments: Likely close to baseline. WFL for basic tasks.  A and O X4. a lot of difficulty with pill box test today, deficits in executive functioning  and problem solving      Exercises      General Comments General comments (skin integrity, edema, etc.): VSS on RA.      Pertinent Vitals/Pain Pain Assessment: Faces Faces Pain Scale: Hurts little more Pain Location: back Pain Descriptors / Indicators: Aching Pain Intervention(s): Monitored during session    Home Living                      Prior Function            PT Goals (current goals can  now be found in the care plan section) Progress towards PT goals: Progressing toward goals    Frequency    Min 3X/week      PT Plan Current plan remains appropriate    Co-evaluation              AM-PAC PT "6 Clicks" Mobility   Outcome Measure  Help needed turning from your back to your side while in a flat bed without using bedrails?: A Little Help needed moving from lying on your back to sitting on the side of a flat bed without using bedrails?: A Little Help needed moving to and from a bed to a chair (including a wheelchair)?: A Little Help needed standing up from a chair using your arms (e.g., wheelchair or bedside chair)?: A Little Help needed to walk in hospital room?: A Little Help needed climbing 3-5 steps with a railing? : A Lot 6 Click Score: 17    End of Session Equipment Utilized During Treatment: Gait belt Activity Tolerance: Patient limited by fatigue;Patient tolerated treatment well Patient left: in chair;with call bell/phone within reach;with chair alarm set Nurse Communication: Mobility status PT Visit Diagnosis: Unsteadiness on feet (R26.81);Muscle weakness (generalized) (M62.81);Difficulty in walking, not elsewhere classified (R26.2)     Time: [id number] PT Time Calculation (min) (ACUTE ONLY): 34 min  Charges:  $Gait Training: 8-22 mins $Therapeutic Activity: 8-22 mins                     Marisa Severin, PT, DPT Acute Rehabilitation Services Pager [phone] Office [place] 04/02/2021, 2:30 PM

## 2021-04-02 NOTE — Progress Notes (Signed)
ANTICOAGULATION CONSULT NOTE - Initial Consult  Pharmacy Consult for apixaban>>heparin Indication: bridge while apixaban is being held  Allergies  Allergen Reactions  . Entresto [Sacubitril-Valsartan] Itching, Rash and Cough  . Lisinopril Cough  . Venlafaxine Rash    Patient Measurements: Height: '5\' 4"'$  (162.6 cm) Weight: 53.9 kg (118 lb 13.3 oz) (scale b) IBW/kg (Calculated) : 54.7 Heparin Dosing Weight: 50kg  Vital Signs: Temp: 98.7 F (37.1 C) (05/30 1546) Temp Source: Oral (05/30 1546) BP: 100/57 (05/30 1546) Pulse Rate: 67 (05/30 1546)  Labs: Recent Labs    03/31/21 0429 04/01/21 0412 04/01/21 2043 04/02/21 0440  HGB 11.1* 10.3*  --  9.9*  HCT 34.1* 32.2*  --  30.9*  PLT 201 174  --  166  CREATININE 1.56* 1.59* 1.60* 1.52*    Estimated Creatinine Clearance: 25.1 mL/min (A) (by C-G formula based on SCr of 1.52 mg/dL (H)).   Medical History: Past Medical History:  Diagnosis Date  . AV block, 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[place]) 07/15/2017  . Bronchitis   . Encounter for care of pacemaker 05/12/2019  . Hypertension   . Pacemaker - MRI safe Medtronic Azure XT DR MRI [id number] Dual chamber PPM in situ 04/24/2019    Assessment:80 year old female on apixaban. Noted plans for biV upgrade on Thursday 6/2. Orders to stop apixaban tonight and start heparin bridge in the morning when apixaban would be due. Further orders to stop heparin bridge at midnight on Wednesday 6/1 prior to procedure. Stop dates have been entered accordingly.   Given recent apixaban dosing, will rely on aptt for heparin monitoring.   Goal of Therapy:  Heparin level 0.3-0.7 units/ml aPTT 66-102 seconds Monitor platelets by anticoagulation protocol: Yes   Plan:  Start heparin infusion at 600 units/hr Check anti-Xa level in 8 hours and daily while on heparin Continue to monitor H&H and platelets  Baseline aptt in am then 8 hours after heparin started Apixaban stops after dose tonight Heparin bridge to start in am  5/31 ~1000 Heparin bridge to stop 6/1 at midnight  Erin Hearing PharmD., BCPS Clinical Pharmacist 04/02/2021 6:28 PM

## 2021-04-02 NOTE — Progress Notes (Signed)
PROGRESS NOTE    Holly [name]  [id number] DOB: May 15, 1941 DOA: 03/26/2021 PCP: Merrilee Seashore, MD  Brief Narrative:  80 y.o. female  heart block s/p pacemaker, paroxysmal atrial fibrillation on Eliquis, combined systolic and diastolic CHF, hypertension, CKD 3a, and chronic iron deficiency anemia, hyperlipidemia, anxiety  Admit from PCP office 2 mo increasing shortness of breath also noticed upper abdominal pain every time that she eats.    Has had decreased p.o. intake because of this pain and because of lack of appetite.   CT of the abdomen obtained shows contracted gallbladder with cholelithiasis but no cholecystitis--anasarca of the abdominal wall and edema of the mesenteric lymphatics.  Patient was recently seen by her cardiologist and then was seen by her PCP on 5/20 and found to have volume depletion and a creatinine of 2.1  On arrival in the ED BNP >4000, creatinine 2.3  Started on diuretics  AHF team consulted by Cardiologist  ? 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 Quentin Ore-- Salvage therapy for her based on Dr. Clayborne Dana last  note  Paroxysmal atrial fibrillation - Continue Eliquis 2.5 twice daily -Beta-blocker discontinued secondary to hypotension  AKI on CKD stage IIIa Cardiorenal syndrome -Defer to cardiology management in addition to diuretics -Creatinine continues to improve with offloading of fluid  Cholelithiasis without cholecystitis - Postprandial fullness/pain over the past few weeks  - Hold off on further imaging, CT abdomen pelvis shows cholelithiasis without cholecystitis as well as diverticulosis without diverticulitis -Continue heart healthy diet  Left upper pole kidney cyst, incidental finding - Cysts suspected to be benign hemorrhagic or proteinaceous cyst -  Renal ultrasound relatively benign--no further w/u   Hypertension  -hold Losartan due to AKI -Metoprolol DC by cardiology  Hyperlipidemia -continue statin  DVT prophylaxis: Eliquis Code Status: Full Family Communication: Discussed with husband at the bedside 5/20.  Status is: Inpatient  Dispo: The patient is from: Home              Anticipated d/c is to: Awaiting PT eval appreciate them seeing patient today              Anticipated d/c date is: Defer to cardiology feasibility of discharge Physical therapy has seen the patient last 5/27 recommending wheelchair 3 and 1 and home health supervision PT and OT              Patient currently not medically stable for discharge  Consultants:   None  Procedures:   None indicated  Antimicrobials:  None indicated  Subjective:  Fair Eating better [had hamburger today] No cp No edema  Objective: Vitals:   04/02/21 0731 04/02/21 0732 04/02/21 1056 04/02/21 1546  BP:  94/61 (!) 100/57 (!) 100/57  Pulse:  (!) 59  67  Resp:  19  16  Temp: 98.6 F (37 C)  (!) 97.5 F (36.4 C) 98.7 F (37.1 C)  TempSrc: Oral  Oral Oral  SpO2:  99%  100%  Weight:      Height:        Intake/Output Summary (Last 24 hours) at 04/02/2021 1554 Last data filed at 04/02/2021  0851 Gross per 24 hour  Intake 544.89 ml  Output 635 ml  Net -90.11 ml   Filed Weights   03/31/21 0622 04/01/21 0420 04/02/21 0400  Weight: 53.3 kg 53.3 kg 53.9 kg    Examination:  Coherent in nad No focal deficit s1 s2 no m/r/g abd soft nt nd no rebound Neuro intact  Data Reviewed: I have personally reviewed following labs and imaging studies  CBC: Recent Labs  Lab 03/31/21 0429 04/01/21 0412 04/02/21 0440  WBC 4.5 4.4 4.3  HGB 11.1* 10.3* 9.9*  HCT 34.1* 32.2* 30.9*  MCV 86.3 86.3 86.8  PLT 201 174 [id number]   Basic Metabolic Panel: Recent Labs  Lab 03/30/21 0738 03/31/21 0429 04/01/21 0412 04/01/21 2043 04/02/21 0440  NA 135 135 134* 133* 134*  K 3.4* 3.6 3.5 4.5 4.3  CL 101 103 100 100 102  CO2 '23 26 27 27 25  '$ GLUCOSE 85 93 98 137* 91  BUN 54* 46* 38* 33* 30*  CREATININE 1.78* 1.56* 1.59* 1.60* 1.52*  CALCIUM 10.7* 10.9* 10.7* 11.0* 10.9*  MG  --  2.0 1.9  --  2.0  PHOS  --  2.8  --   --   --    GFR: Estimated Creatinine Clearance: 25.1 mL/min (A) (by C-G formula based on SCr of 1.52 mg/dL (H)). Liver Function Tests: Recent Labs  Lab 03/28/21 0857 03/30/21 0738 03/31/21 0429 04/01/21 0412  AST 38 28  --  20  ALT 34 28  --  19  ALKPHOS 45 40  --  35*  BILITOT 1.7* 1.3*  --  1.1  PROT 7.0 6.9  --  6.6  ALBUMIN 3.1* 3.0* 2.8* 2.8*   No results for input(s): LIPASE, AMYLASE in the last 168 hours. No results for input(s): AMMONIA in the last 168 hours. Coagulation Profile: No results for input(s): INR, PROTIME in the last 168 hours. Cardiac Enzymes: No results for input(s): CKTOTAL, CKMB, CKMBINDEX, TROPONINI in the last 168 hours. BNP (last 3 results) Recent Labs    07/20/20 1201  PROBNP 9,907*   HbA1C: No results for input(s): HGBA1C in the last 72 hours. CBG: No results for input(s): GLUCAP in the last 168 hours. Lipid Profile: No results for input(s): CHOL, HDL, LDLCALC, TRIG, CHOLHDL, LDLDIRECT in the last 72 hours. Thyroid Function  Tests: No results for input(s): TSH, T4TOTAL, FREET4, T3FREE, THYROIDAB in the last 72 hours. Anemia Panel: No results for input(s): VITAMINB12, FOLATE, FERRITIN, TIBC, IRON, RETICCTPCT in the last 72 hours. Sepsis Labs: Recent Labs  Lab 03/30/21 0322 03/30/21 0738  LATICACIDVEN 1.2 1.3    Recent Results (from the past 240 hour(s))  Resp Panel by RT-PCR (Flu A&B, Covid) Nasopharyngeal Swab     Status: None   Collection Time: 03/26/21 11:43 PM   Specimen: Nasopharyngeal Swab; Nasopharyngeal(NP) swabs in vial transport medium  Result Value Ref Range Status   SARS Coronavirus 2 by RT PCR NEGATIVE NEGATIVE Final    Comment: (NOTE) SARS-CoV-2 target nucleic acids are NOT DETECTED.  The SARS-CoV-2 RNA is generally detectable  in upper respiratory specimens during the acute phase of infection. The lowest concentration of SARS-CoV-2 viral copies this assay can detect is 138 copies/mL. A negative result does not preclude SARS-Cov-2 infection and should not be used as the sole basis for treatment or other patient management decisions. A negative result may occur with  improper specimen collection/handling, submission of specimen other than nasopharyngeal swab, presence of viral mutation(s) within the areas targeted by this assay, and inadequate number of viral copies(<138 copies/mL). A negative result must be combined with clinical observations, patient history, and epidemiological information. The expected result is Negative.  Fact Sheet for Patients:  EntrepreneurPulse.com.au  Fact Sheet for Healthcare Providers:  IncredibleEmployment.be  This test is no t yet approved or cleared by the [place] FDA and  has been authorized for detection and/or diagnosis of SARS-CoV-2 by FDA under an Emergency Use Authorization (EUA). This EUA will remain  in effect (meaning this test can be used) for the duration of the COVID-19 declaration under Section  564(b)(1) of the Act, 21 U.S.C.section 360bbb-3(b)(1), unless the authorization is terminated  or revoked sooner.       Influenza A by PCR NEGATIVE NEGATIVE Final   Influenza B by PCR NEGATIVE NEGATIVE Final    Comment: (NOTE) The Xpert Xpress SARS-CoV-2/FLU/RSV plus assay is intended as an aid in the diagnosis of influenza from Nasopharyngeal swab specimens and should not be used as a sole basis for treatment. Nasal washings and aspirates are unacceptable for Xpert Xpress SARS-CoV-2/FLU/RSV testing.  Fact Sheet for Patients: EntrepreneurPulse.com.au  Fact Sheet for Healthcare Providers: IncredibleEmployment.be  This test is not yet approved or cleared by the [place] FDA and has been authorized for detection and/or diagnosis of SARS-CoV-2 by FDA under an Emergency Use Authorization (EUA). This EUA will remain in effect (meaning this test can be used) for the duration of the COVID-19 declaration under Section 564(b)(1) of the Act, 21 U.S.C. section 360bbb-3(b)(1), unless the authorization is terminated or revoked.  Performed at [facility], [place] [address], [place], [place] [address]      Radiology Studies: No results found. Scheduled Meds: . apixaban  2.5 mg Oral BID  . vitamin C  1,000 mg Oral Daily  . Chlorhexidine Gluconate Cloth  6 each Topical Daily  . feeding supplement  237 mL Oral BID BM  . multivitamin with minerals  1 tablet Oral Daily  . polyethylene glycol  17 g Oral Daily  . rosuvastatin  10 mg Oral QPM  . senna-docusate  1 tablet Oral QHS  . sodium chloride flush  10-40 mL Intracatheter Q12H  . torsemide  40 mg Oral Daily   Continuous Infusions: . DOBUTamine 2.5 mcg/kg/min (04/02/21 0244)     LOS: 6 days   Time spent: 25 minutes  Verneita Griffes, MD [facility] 3:54 PM   If 7PM-7AM, please contact night-coverage [email or web address]  04/02/2021, 3:54 PM

## 2021-04-02 NOTE — Plan of Care (Signed)

## 2021-04-02 NOTE — TOC Initial Note (Signed)
Transition of Care (TOC) - Initial/Assessment Note  Heart Failure   Patient Details  Name: Holly [name] MRN: [id number] Date of Birth: 1941/03/10  Transition of Care [facility]) CM/SW Contact:    [place], [place] Phone Number: 04/02/2021, 11:25 AM  Clinical Narrative:        CSW spoke with the patient at bedside and completed a very brief SDOH screening with the patient who reported that she does receive Food Stamps and her daughter helps with groceries and meal prep. Ms. Hemmert gave permission for the CSW to speak with her daughter Ivin Booty if needed and that she has a light bill that is overdue, and she is trying to make payments on it, so they don't turn off the lights. CSW to make referral to [facility] outpatient social worker for support with the patient's light bill. CSW spoke with the patient's daughter Ivin Booty, [phone] who reported providing support to her mom for meals and that she has transportation and can get to appointments. Ivin Booty also discussed about the light bill and confirmed making payments. Ivin Booty reported that her brother and niece stay with Ms. Balyeat and help provide support as needed. CSW will schedule the patient a hospital follow up with her primary care doctor closer to discharge.  TOC will continue to follow for discharge needs.        Expected Discharge Plan: [place] Barriers to Discharge: Continued Medical Work up   Patient Goals and CMS Choice        Expected Discharge Plan and Services Expected Discharge Plan: [place] In-house Referral: Clinical Social Work     Living arrangements for the past 2 months: [place]                                      Prior Living Arrangements/Services Living arrangements for the past 2 months: Single Family Home Lives with:: Self,Adult Children,Relatives Patient language and need for interpreter reviewed:: Yes        Need for Family Participation in Patient  Care: No (Comment) Care giver support system in place?: No (comment)   Criminal Activity/Legal Involvement Pertinent to Current Situation/Hospitalization: No - Comment as needed  Activities of Daily Living      Permission Sought/Granted                  Emotional Assessment Appearance:: Appears stated age Attitude/Demeanor/Rapport: Engaged Affect (typically observed): Pleasant Orientation: : Oriented to Self,Oriented to Place,Oriented to  Time,Oriented to Situation   Psych Involvement: No (comment)  Admission diagnosis:  Renal cyst [N28.1] Calculus of gallbladder without cholecystitis without obstruction [K80.20] AKI (acute kidney injury) ([place]) [N17.9] Acute exacerbation of congestive heart failure ([place]) [I50.9] Patient Active Problem List   Diagnosis Date Noted  . Weakness generalized   . Palliative care by specialist   . Malnutrition of moderate degree 03/29/2021  . Cholelithiasis 03/27/2021  . Congestive heart failure ([place]) 03/27/2021  . AKI (acute kidney injury) ([place]) 03/26/2021  . Chronic systolic heart failure ([place]) 11/14/2020  . Paroxysmal A-fib ([place]) 05/04/2020  . HFrEF (heart failure with reduced ejection fraction) ([place]) 04/21/2020  . Acute systolic heart failure ([place]) 04/10/2020  . Nonrheumatic mitral valve regurgitation 04/10/2020  . Elevated brain natriuretic peptide (BNP) level 03/30/2020  . Leg edema 03/30/2020  . SOB (shortness of breath) 03/15/2020  . 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  .  NSVT (nonsustained ventricular tachycardia) ([place]) 09/23/2019  . DNR (do not resuscitate) discussion 05/12/2019  . Coronary artery disease of native artery of native heart with stable angina pectoris ([place]) 04/24/2019  . Essential hypertension 04/24/2019  . Abnormal stress test 11/08/2017  . 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[place]) 07/15/2017  . Pacemaker - MRI safe Medtronic Azure XT DR MRI [id number] Dual chamber PPM in situ 07/15/2017  . Unspecified hereditary and  idiopathic peripheral neuropathy 08/09/2013  . 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 Carpal tunnel syndrome 08/09/2013   PCP:  Merrilee Seashore, MD Pharmacy:   [id number] [id number] [id number]: [phone]: [phone]    Social Determinants of Health (SDOH) Interventions Food Insecurity Interventions: Intervention Not Indicated,Other (Comment) (Patient reports receiving Food Stamps) Financial Strain Interventions: Other (Comment) (Referral to outpatient HV CSW for help with light bill) Housing Interventions: Intervention Not Indicated Transportation Interventions: Intervention Not Indicated  Readmission Risk Interventions No flowsheet data found.  Hayly Litsey, MSW, LFarinaHeart Failure Social Worker

## 2021-04-02 NOTE — Progress Notes (Signed)
    Progress Note from the Palliative Medicine Team at [facility]   Patient Name: Holly [name]        Date: 04/02/2021 DOB: 11-29-1940  Age: 80 y.o. MRN#: [id number] Attending Physician: Nita Sells, MD Primary Care Physician: Merrilee Seashore, MD Admit Date: 03/26/2021   Medical records reviewed. Seen by Advanced Heart Failure team, on milrinone now. Deemed 50-50 mortality at 6 months without CRT-P upgrade.  This NP visited patient at the bedside as a follow up to  yesterday's [place]. The patient feels well today. She was in bed reading the bible and she states this is important to her. Breathing well today. No pain, no significant anxiety.  No other questions or complaints/concerns today. Wants continued full scope of treatment.  She states she was told planned CRT-P upgrade on Thursday 04-05-21.  Discussed with patient the importance of continued conversation with family and their  medical providers regarding overall plan of care and treatment options,  ensuring decisions are within the context of the patients values and GOCs.  Questions and concerns addressed.  Total time spent on the unit was 15 minutes  Greater than 50% of the time was spent in counseling and coordination of care  Walden Field, NP Wadie Lessen NP  Palliative Medicine Team Team Phone # [phone] Pager [phone]

## 2021-04-03 DIAGNOSIS — N179 Acute kidney failure, unspecified: Secondary | ICD-10-CM | POA: Diagnosis not present

## 2021-04-03 DIAGNOSIS — Z950 Presence of cardiac pacemaker: Secondary | ICD-10-CM | POA: Diagnosis not present

## 2021-04-03 DIAGNOSIS — I480 Paroxysmal atrial fibrillation: Secondary | ICD-10-CM | POA: Diagnosis not present

## 2021-04-03 DIAGNOSIS — I5023 Acute on chronic systolic (congestive) heart failure: Secondary | ICD-10-CM | POA: Diagnosis not present

## 2021-04-03 LAB — CBC
HCT: 31.3 % — ABNORMAL LOW (ref 36.0–46.0)
Hemoglobin: 10.2 g/dL — ABNORMAL LOW (ref 12.0–15.0)
MCH: 28.1 pg (ref 26.0–34.0)
MCHC: 32.6 g/dL (ref 30.0–36.0)
MCV: 86.2 fL (ref 80.0–100.0)
Platelets: 166 10*3/uL (ref 150–400)
RBC: 3.63 MIL/uL — ABNORMAL LOW (ref 3.87–5.11)
RDW: 16.1 % — ABNORMAL HIGH (ref 11.5–15.5)
WBC: 4.4 10*3/uL (ref 4.0–10.5)
nRBC: 0 % (ref 0.0–0.2)

## 2021-04-03 LAB — APTT
aPTT: 36 seconds (ref 24–36)
aPTT: 37 seconds — ABNORMAL HIGH (ref 24–36)

## 2021-04-03 LAB — COOXEMETRY PANEL
Carboxyhemoglobin: 1.7 % — ABNORMAL HIGH (ref 0.5–1.5)
Methemoglobin: 0.9 % (ref 0.0–1.5)
O2 Saturation: 66.7 %
Total hemoglobin: 10.2 g/dL — ABNORMAL LOW (ref 12.0–16.0)

## 2021-04-03 LAB — HEPARIN LEVEL (UNFRACTIONATED): Heparin Unfractionated: >1.1 IU/mL — ABNORMAL HIGH (ref 0.30–0.70)

## 2021-04-03 LAB — MAGNESIUM: Magnesium: 2.1 mg/dL (ref 1.7–2.4)

## 2021-04-03 MED ORDER — ACETAMINOPHEN 500 MG PO TABS
500.0000 mg | ORAL_TABLET | Freq: Four times a day (QID) | ORAL | Status: DC | PRN
  Administered 2021-04-03: 500 mg via ORAL
  Filled 2021-04-03: qty 1

## 2021-04-03 NOTE — Progress Notes (Signed)
PROGRESS NOTE    LOGANN TRINH  [id number] DOB: [birth year] DOA: 03/26/2021 PCP: Merrilee Seashore, MD  Brief Narrative:  80 y.o. female  heart block s/p pacemaker, paroxysmal atrial fibrillation on Eliquis, combined systolic and diastolic CHF, hypertension, CKD 3a, and chronic iron deficiency anemia, hyperlipidemia, anxiety  Admit from PCP office 2 mo increasing shortness of breath also noticed upper abdominal pain every time that she eats.    Has had decreased p.o. intake because of this pain and because of lack of appetite.   CT of the abdomen obtained shows contracted gallbladder with cholelithiasis but no cholecystitis--anasarca of the abdominal wall and edema of the mesenteric lymphatics.  Patient was recently seen by her cardiologist and then was seen by her PCP on 5/20 and found to have volume depletion and a creatinine of 2.1  On arrival in the ED BNP >4000, creatinine 2.3  Started on diuretics  AHF team consulted by Cardiologist  PPM upgrade-plans to be done later on this week by EP   Assessment & Plan:   Principal Problem:   AKI (acute kidney injury) ([place]) Active Problems:   Essential hypertension   DNR (do not resuscitate) discussion   S/P placement of cardiac pacemaker   Acute systolic heart failure (HCC)   Paroxysmal A-fib ([place])   Cholelithiasis   Congestive heart failure (HCC)   Malnutrition of moderate degree   Weakness generalized   Palliative care by specialist   Acute on chronic Combined systolic and diastolic CHF 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 -Repeat echo shows a depressed EF of 20% -Dobutamine-PICC line-CVP -now back on oral home Torsemide 40per advanced heart failure team will -Weight unchanged, I/O status -2.8 -Palliative care has been following patient-patient full code  EF 20% History of heart block s/p pacemaker - -Possible CRT-P upgrade later this week -Patient and family aware  Paroxysmal atrial fibrillation - Continue Eliquis 2.5  twice daily -Beta-blocker discontinued secondary to hypotension  AKI on CKD stage IIIa Cardiorenal syndrome -Defer to cardiology management in addition to diuretics -Creatinine continues to improve with offloading of fluid  Cholelithiasis without cholecystitis - Postprandial fullness/pain over the past few weeks  - Hold off on further imaging, CT abdomen pelvis shows cholelithiasis without cholecystitis as well as diverticulosis without diverticulitis -Continue heart healthy diet  Left upper pole kidney cyst, incidental finding - Cysts suspected to be benign hemorrhagic or proteinaceous cyst -  Renal ultrasound relatively benign--no further w/u   Hypertension  -hold Losartan due to AKI -Metoprolol DC by cardiology  Hyperlipidemia -continue statin  DVT prophylaxis: Eliquis Code Status: Full Family Communication: No family present today  Status is: Inpatient  Dispo: The patient is from: Home              Anticipated d/c is to: Awaiting PT eval appreciate them seeing patient today              Anticipated d/c date is: Defer to cardiology feasibility of discharge Physical therapy has seen the patient last 5/27 recommending wheelchair 3 and 1 and home health supervision PT and OT              Patient currently not medically stable for discharge  Consultants:   None  Procedures:   None indicated  Antimicrobials:  None indicated  Subjective:  Awake coherent sitting up in bed no distress No chest pain No fever  Objective: Vitals:   04/03/21 0500 04/03/21 0630 04/03/21 0725 04/03/21 1108  BP:  (!) 100/57 104/63 (!) 91/52  Pulse:  66 63   Resp:  (!) 26 16   Temp:  98.6 F (37 C) 98.5 F (36.9 C) 98.1 F (36.7 C)  TempSrc:  Oral Oral Oral  SpO2:  97% 100%   Weight: 53.6 kg     Height:        Intake/Output Summary (Last 24 hours) at 04/03/2021 1452 Last data filed at 04/03/2021 1252 Gross per 24 hour  Intake 777 ml  Output 1800 ml  Net -1023 ml    Filed Weights   04/01/21 0420 04/02/21 0400 04/03/21 0500  Weight: 53.3 kg 53.9 kg 53.6 kg    Examination:  Coherent no distress No focal deficit EOMI NCAT Abdomen soft no rebound no guarding No lower extremity edema ROM intact  Data Reviewed: I have personally reviewed following labs and imaging studies  CBC: Recent Labs  Lab 03/31/21 0429 04/01/21 0412 04/02/21 0440 04/03/21 0500  WBC 4.5 4.4 4.3 4.4  HGB 11.1* 10.3* 9.9* 10.2*  HCT 34.1* 32.2* 30.9* 31.3*  MCV 86.3 86.3 86.8 86.2  PLT 201 174 166 [id number]   Basic Metabolic Panel: Recent Labs  Lab 03/30/21 0738 03/31/21 0429 04/01/21 0412 04/01/21 2043 04/02/21 0440 04/03/21 0500  NA 135 135 134* 133* 134*  --   K 3.4* 3.6 3.5 4.5 4.3  --   CL 101 103 100 100 102  --   CO2 '23 26 27 27 25  '$ --   GLUCOSE 85 93 98 137* 91  --   BUN 54* 46* 38* 33* 30*  --   CREATININE 1.78* 1.56* 1.59* 1.60* 1.52*  --   CALCIUM 10.7* 10.9* 10.7* 11.0* 10.9*  --   MG  --  2.0 1.9  --  2.0 2.1  PHOS  --  2.8  --   --   --   --    GFR: Estimated Creatinine Clearance: 25 mL/min (A) (by C-G formula based on SCr of 1.52 mg/dL (H)). Liver Function Tests: Recent Labs  Lab 03/28/21 0857 03/30/21 0738 03/31/21 0429 04/01/21 0412  AST 38 28  --  20  ALT 34 28  --  19  ALKPHOS 45 40  --  35*  BILITOT 1.7* 1.3*  --  1.1  PROT 7.0 6.9  --  6.6  ALBUMIN 3.1* 3.0* 2.8* 2.8*   No results for input(s): LIPASE, AMYLASE in the last 168 hours. No results for input(s): AMMONIA in the last 168 hours. Coagulation Profile: No results for input(s): INR, PROTIME in the last 168 hours. Cardiac Enzymes: No results for input(s): CKTOTAL, CKMB, CKMBINDEX, TROPONINI in the last 168 hours. BNP (last 3 results) Recent Labs    07/20/20 1201  PROBNP 9,907*   HbA1C: No results for input(s): HGBA1C in the last 72 hours. CBG: No results for input(s): GLUCAP in the last 168 hours. Lipid Profile: No results for input(s): CHOL, HDL, LDLCALC,  TRIG, CHOLHDL, LDLDIRECT in the last 72 hours. Thyroid Function Tests: No results for input(s): TSH, T4TOTAL, FREET4, T3FREE, THYROIDAB in the last 72 hours. Anemia Panel: No results for input(s): VITAMINB12, FOLATE, FERRITIN, TIBC, IRON, RETICCTPCT in the last 72 hours. Sepsis Labs: Recent Labs  Lab 03/30/21 0322 03/30/21 0738  LATICACIDVEN 1.2 1.3    Recent Results (from the past 240 hour(s))  Resp Panel by RT-PCR (Flu A&B, Covid) Nasopharyngeal Swab     Status: None   Collection Time: 03/26/21 11:43 PM   Specimen: Nasopharyngeal Swab; Nasopharyngeal(NP) swabs in vial transport medium  Result Value  Ref Range Status   SARS Coronavirus 2 by RT PCR NEGATIVE NEGATIVE Final    Comment: (NOTE) SARS-CoV-2 target nucleic acids are NOT DETECTED.  The SARS-CoV-2 RNA is generally detectable in upper respiratory specimens during the acute phase of infection. The lowest concentration of SARS-CoV-2 viral copies this assay can detect is 138 copies/mL. A negative result does not preclude SARS-Cov-2 infection and should not be used as the sole basis for treatment or other patient management decisions. A negative result may occur with  improper specimen collection/handling, submission of specimen other than nasopharyngeal swab, presence of viral mutation(s) within the areas targeted by this assay, and inadequate number of viral copies(<138 copies/mL). A negative result must be combined with clinical observations, patient history, and epidemiological information. The expected result is Negative.  Fact Sheet for Patients:  EntrepreneurPulse.com.au  Fact Sheet for Healthcare Providers:  IncredibleEmployment.be  This test is no t yet approved or cleared by the [place] FDA and  has been authorized for detection and/or diagnosis of SARS-CoV-2 by FDA under an Emergency Use Authorization (EUA). This EUA will remain  in effect (meaning this test can be  used) for the duration of the COVID-19 declaration under Section 564(b)(1) of the Act, 21 U.S.C.section 360bbb-3(b)(1), unless the authorization is terminated  or revoked sooner.       Influenza A by PCR NEGATIVE NEGATIVE Final   Influenza B by PCR NEGATIVE NEGATIVE Final    Comment: (NOTE) The Xpert Xpress SARS-CoV-2/FLU/RSV plus assay is intended as an aid in the diagnosis of influenza from Nasopharyngeal swab specimens and should not be used as a sole basis for treatment. Nasal washings and aspirates are unacceptable for Xpert Xpress SARS-CoV-2/FLU/RSV testing.  Fact Sheet for Patients: EntrepreneurPulse.com.au  Fact Sheet for Healthcare Providers: IncredibleEmployment.be  This test is not yet approved or cleared by the [place] FDA and has been authorized for detection and/or diagnosis of SARS-CoV-2 by FDA under an Emergency Use Authorization (EUA). This EUA will remain in effect (meaning this test can be used) for the duration of the COVID-19 declaration under Section 564(b)(1) of the Act, 21 U.S.C. section 360bbb-3(b)(1), unless the authorization is terminated or revoked.  Performed at [facility], [place] [address], [place], [place] [address]      Radiology Studies: No results found. Scheduled Meds: . vitamin C  1,000 mg Oral Daily  . Chlorhexidine Gluconate Cloth  6 each Topical Daily  . feeding supplement  237 mL Oral BID BM  . multivitamin with minerals  1 tablet Oral Daily  . polyethylene glycol  17 g Oral Daily  . rosuvastatin  10 mg Oral QPM  . senna-docusate  1 tablet Oral QHS  . sodium chloride flush  10-40 mL Intracatheter Q12H  . torsemide  40 mg Oral Daily   Continuous Infusions: . DOBUTamine 2.5 mcg/kg/min (04/02/21 0244)  . heparin 600 Units/hr (04/03/21 1024)     LOS: 7 days   Time spent: 15 minutes  Verneita Griffes, MD [facility] 2:52 PM   If 7PM-7AM, please contact  night-coverage [email or web address]  04/03/2021, 2:52 PM

## 2021-04-03 NOTE — Progress Notes (Signed)
Subjective:  Feels well Reading this morning Appetite has improved  Objective:  Vital Signs in the last 24 hours: Temp:  [98.1 F (36.7 C)-98.7 F (37.1 C)] 98.1 F (36.7 C) (05/31 1108) Pulse Rate:  [61-69] 63 (05/31 0725) Resp:  [12-26] 16 (05/31 0725) BP: (91-104)/(52-63) 91/52 (05/31 1108) SpO2:  [97 %-100 %] 100 % (05/31 0725) Weight:  [53.6 kg] 53.6 kg (05/31 0500)  Intake/Output from previous day: 05/30 0701 - 05/31 0700 In: 597 [P.O.:597] Out: [place] [Urine:1365]  Physical Exam Vitals and nursing note reviewed.  Constitutional:      General: She is not in acute distress.    Appearance: She is well-developed. She is cachectic.  HENT:     Head: Normocephalic and atraumatic.  Eyes:     Conjunctiva/sclera: Conjunctivae normal.     Pupils: Pupils are equal, round, and reactive to light.  Neck:     Vascular: JVD present.  Cardiovascular:     Rate and Rhythm: Normal rate and regular rhythm.     Pulses: Normal pulses and intact distal pulses.     Heart sounds: No murmur heard.   Pulmonary:     Effort: Pulmonary effort is normal.     Breath sounds: Examination of the right-middle field reveals rales. Examination of the right-lower field reveals rales. Rales present. No wheezing.  Abdominal:     General: Bowel sounds are normal.     Palpations: Abdomen is soft.     Tenderness: There is no rebound.  Musculoskeletal:        General: No tenderness. Normal range of motion.     Right lower leg: No edema.     Left lower leg: No edema.  Lymphadenopathy:     Cervical: No cervical adenopathy.  Skin:    General: Skin is warm and dry.  Neurological:     Mental Status: She is alert and oriented to person, place, and time.     Cranial Nerves: No cranial nerve deficit.      Lab Results: BMP Recent Labs    05/19/20 1114 06/05/20 1311 08/25/20 1601 03/26/21 1523 04/01/21 0412 04/01/21 2043 04/02/21 0440  NA 130* 131* 132*   < > 134* 133* 134*  K 4.7 4.5 4.8   < >  3.5 4.5 4.3  CL 95* 97 98   < > 100 100 102  CO2 17* 21 20   < > '27 27 25  '$ GLUCOSE 99 79 109*   < > 98 137* 91  BUN 18 25 37*   < > 38* 33* 30*  CREATININE 1.21* 1.29* 1.36*   < > 1.59* 1.60* 1.52*  CALCIUM 12.1* 11.9* 10.4*   < > 10.7* 11.0* 10.9*  GFRNONAA 42* 39* 37*   < > 33* 32* 34*  GFRAA 49* 45* 42*  --   --   --   --    < > = values in this interval not displayed.    CBC Recent Labs  Lab 04/03/21 0500  WBC 4.4  RBC 3.63*  HGB 10.2*  HCT 31.3*  PLT 166  MCV 86.2  MCH 28.1  MCHC 32.6  RDW 16.1*     BNP (last 3 results) Recent Labs    06/05/20 1311 03/26/21 1523 03/30/21 0738  BNP 1,854.5* >4,500.0* >4,500.0*    Lipid Panel     Component Value Date/Time   CHOL 145 11/01/2019 1126   TRIG 49 11/01/2019 1126   HDL 76 11/01/2019 1126   CHOLHDL 1.9 11/01/2019  [place] 11/01/2019 1126     Hepatic Function Panel Recent Labs    03/28/21 0857 03/30/21 0738 03/31/21 0429 04/01/21 0412  PROT 7.0 6.9  --  6.6  ALBUMIN 3.1* 3.0* 2.8* 2.8*  AST 38 28  --  20  ALT 34 28  --  19  ALKPHOS 45 40  --  35*  BILITOT 1.7* 1.3*  --  1.1    CARDIAC STUDIES:  EKG 03/27/2021: AV dual paced rhythm  1 Brief NSVT episode on pacemaker interrogation on 02/14/2021 No recent alert  Echocardiogram 03/27/2021: 1. Left ventricular ejection fraction, by estimation, is <20%. The left  ventricle has severely decreased function. The left ventricle demonstrates  global hypokinesis. There is mild left ventricular hypertrophy. Left  ventricular diastolic parameters are  indeterminate.  2. Right ventricular systolic function is normal. The right ventricular  size is normal.  3. Left atrial size was severely dilated.  4. Right atrial size was severely dilated.  5. The mitral valve is normal in structure. Severe mitral valve  regurgitation. No evidence of mitral stenosis.  6. Tricuspid valve regurgitation is severe.  7. The aortic valve is normal in  structure. There is mild calcification  of the aortic valve. There is mild thickening of the aortic valve. Aortic  valve regurgitation is trivial. Mild to moderate aortic valve  sclerosis/calcification is present, without  any evidence of aortic stenosis.  8. The inferior vena cava is dilated in size with <50% respiratory  variability, suggesting right atrial pressure of 15 mmHg.   Assessment & Recommendations:  80 year old [place] American female with hypertension, dual-chamber pacemaker placement for AV block, moderate, medically managed coronary artery disease (LCx), hypertension, NSVT, HFrEF  Acute on chronic systolic heart failure: Acute decompensation. She has cardiac cachexia, with rales on exam. No other overt edema. LVEF <20%. 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 Continue dobutamine, follow co-ox. Plan for CRT-P on 6/2, subject to subclavian vein patency. Appreciate heart failure and EP input.  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 Appreciate nutrition consult  AKI: Cardiorenal syndrome. AKi now resolved, Cr stable  PAF: Continue eliquis 2.5 mg bid Interruption recommendation as per EP.   Nigel Mormon, MD Pager: [phone] Office: [phone]

## 2021-04-03 NOTE — Progress Notes (Signed)
Advanced Heart Failure Rounding Note   Subjective:    Remains on DBA at 2.5. Co-ox 67%  On torsemide 40 daily Weigh and renal function stable   Denies SOB, orthopnea or PND  Objective:   Weight Range:  Vital Signs:   Temp:  [97.5 F (36.4 C)-98.7 F (37.1 C)] 98.5 F (36.9 C) (05/31 0400) Pulse Rate:  [59-69] 61 (05/31 0400) Resp:  [12-19] 12 (05/31 0400) BP: (94-104)/(57-63) 103/62 (05/31 0400) SpO2:  [98 %-100 %] 98 % (05/31 0400) Weight:  [53.6 kg] 53.6 kg (05/31 0500) Last BM Date: 03/31/21  Weight change: Filed Weights   04/01/21 0420 04/02/21 0400 04/03/21 0500  Weight: 53.3 kg 53.9 kg 53.6 kg    Intake/Output:   Intake/Output Summary (Last 24 hours) at 04/03/2021 0636 Last data filed at 04/03/2021 0534 Gross per 24 hour  Intake 597 ml  Output 1365 ml  Net -768 ml     Physical Exam: General:  Thin elderly woman lying flat in bed No resp difficulty HEENT: normal Neck: supple. no JVP 7. Carotids 2+ bilat; no bruits. No lymphadenopathy or thryomegaly appreciated. Cor: PMI nondisplaced. Regular rate & rhythm. 2/6 MR Lungs: clear Abdomen: soft, nontender, nondistended. No hepatosplenomegaly. No bruits or masses. Good bowel sounds. Extremities: no cyanosis, clubbing, rash, edema Neuro: alert & orientedx3, cranial nerves grossly intact. moves all 4 extremities w/o difficulty. Affect pleasant   Telemetry: AV paced 60-70s Personally reviewed  Labs: Basic Metabolic Panel: Recent Labs  Lab 03/30/21 0738 03/31/21 0429 04/01/21 0412 04/01/21 2043 04/02/21 0440 04/03/21 0500  NA 135 135 134* 133* 134*  --   K 3.4* 3.6 3.5 4.5 4.3  --   CL 101 103 100 100 102  --   CO2 '23 26 27 27 25  ' --   GLUCOSE 85 93 98 137* 91  --   BUN 54* 46* 38* 33* 30*  --   CREATININE 1.78* 1.56* 1.59* 1.60* 1.52*  --   CALCIUM 10.7* 10.9* 10.7* 11.0* 10.9*  --   MG  --  2.0 1.9  --  2.0 2.1  PHOS  --  2.8  --   --   --   --     Liver Function Tests: Recent Labs  Lab  03/28/21 0857 03/30/21 0738 03/31/21 0429 04/01/21 0412  AST 38 28  --  20  ALT 34 28  --  19  ALKPHOS 45 40  --  35*  BILITOT 1.7* 1.3*  --  1.1  PROT 7.0 6.9  --  6.6  ALBUMIN 3.1* 3.0* 2.8* 2.8*   No results for input(s): LIPASE, AMYLASE in the last 168 hours. No results for input(s): AMMONIA in the last 168 hours.  CBC: Recent Labs  Lab 03/31/21 0429 04/01/21 0412 04/02/21 0440 04/03/21 0500  WBC 4.5 4.4 4.3 4.4  HGB 11.1* 10.3* 9.9* 10.2*  HCT 34.1* 32.2* 30.9* 31.3*  MCV 86.3 86.3 86.8 86.2  PLT 201 174 166 166    Cardiac Enzymes: No results for input(s): CKTOTAL, CKMB, CKMBINDEX, TROPONINI in the last 168 hours.  BNP: BNP (last 3 results) Recent Labs    06/05/20 1311 03/26/21 1523 03/30/21 0738  BNP 1,854.5* >4,500.0* >4,500.0*    ProBNP (last 3 results) Recent Labs    07/20/20 1201  PROBNP 9,907*      Other results:  Imaging: No results found.   Medications:     Scheduled Medications: . vitamin C  1,000 mg Oral Daily  . Chlorhexidine  Gluconate Cloth  6 each Topical Daily  . feeding supplement  237 mL Oral BID BM  . multivitamin with minerals  1 tablet Oral Daily  . polyethylene glycol  17 g Oral Daily  . rosuvastatin  10 mg Oral QPM  . senna-docusate  1 tablet Oral QHS  . sodium chloride flush  10-40 mL Intracatheter Q12H  . torsemide  40 mg Oral Daily    Infusions: . DOBUTamine 2.5 mcg/kg/min (04/02/21 0244)  . heparin      PRN Medications: ALPRAZolam, sodium chloride flush   Assessment/Plan:   1. 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 Pt previously declined upgrade to CRT-P - Echo 07/15/2017: EF 65-70%, RV normal (PPM implanted 07/2017) - Echo 04/04/2020: EF 25-30%, RV normal  - Echo 03/27/2021: EF < 20%, global HK. RV normal. 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 Quentin Ore yesterday and is now scheduled for CRT-P upgrade attempt on Thursday pending patency of left subclavian system - Patient and her daughters realize that her cardiomyopathy is quite advanced and may be too advanced to benefit from CRT at this time but it is our only durable option for an attempt at LV recovery.  2. AKI - prior SCr from 10/21 was 1.4 - 2.1>>2.3. Now stabilized at 1.5  3. Mitral Regurgitation  - severe on Echo, likely functional. Hopefully will improve with CRT   4. CHB - s/p PPM   5. Hypokalemia - BMET pending  6. CAD - records indicate mod LCx disease.  - No s/s angina   Length of Stay: 7  Glori Bickers MD 04/03/2021, 6:36 AM  Advanced Heart Failure Team Pager [phone] (M-F; 7a - 4p)  Please contact [facility] for night-coverage after hours (4p -7a ) and weekends on [email or web address]

## 2021-04-03 NOTE — Progress Notes (Addendum)
ANTICOAGULATION CONSULT NOTE - Follow Up Consult  Pharmacy Consult for Apixaban >> IV Heparin Indication:  Bridge while apixaban is being held (atrial fibrillation)  Allergies  Allergen Reactions  . Entresto [Sacubitril-Valsartan] Itching, Rash and Cough  . Lisinopril Cough  . Venlafaxine Rash    Patient Measurements: Height: '5\' 4"'$  (162.6 cm) Weight: 53.6 kg (118 lb 3.2 oz) (scale b) IBW/kg (Calculated) : 54.7 Heparin Dosing Weight: 50 kg  Vital Signs: Temp: 98.1 F (36.7 C) (05/31 1600) Temp Source: Oral (05/31 1600) BP: 91/52 (05/31 1108)  Labs: Recent Labs    04/01/21 0412 04/01/21 2043 04/02/21 0440 04/03/21 0500 04/03/21 1800  HGB 10.3*  --  9.9* 10.2*  --   HCT 32.2*  --  30.9* 31.3*  --   PLT 174  --  166 166  --   APTT  --   --   --  36 37*  HEPARINUNFRC  --   --   --   --  >1.10*  CREATININE 1.59* 1.60* 1.52*  --   --     Estimated Creatinine Clearance: 25 mL/min (A) (by C-G formula based on SCr of 1.52 mg/dL (H)).   Medical History: Past Medical History:  Diagnosis Date  . AV block, 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[place]) 07/15/2017  . Bronchitis   . Encounter for care of pacemaker 05/12/2019  . Hypertension   . Pacemaker - MRI safe Med[employer]onic Azure XT DR MRI [id number] Dual chamber PPM in situ 04/24/2019    Assessment: 80 year old female on apixaban PTA for atrial fibrillation. Noted plans for biV upgrade on Thursday 6/2. Orders to stop apixaban on 5/30 and start heparin bridge in the morning of 5/31 when apixaban would be due. Further orders to stop IV heparin bridge at midnight on Wednesday 6/1 prior to procedure. Stop dates have been entered accordingly.   Given recent apixaban dosing, will monitor anticoagulation using aPTT until aPTT and heparin levels correlate.  Initial aPTT and heparin level ~7.5 hrs after initiation of heparin infusion at 600 units/hr were 37 sec and >1.10 units/ml, respectively, indicating that apixaban is influencing heparin level. aPTT is beloiw the  goal range for this pt. H/H 10.2/31.3, plt 166 (CBC stable). Per RN, no issues with IV or bleeding observed.  Goal of Therapy:  Heparin level 0.3-0.7 units/ml aPTT 66-102 seconds Monitor platelets by anticoagulation protocol: Yes   Plan:  Increase heparin infusion to 700 units/hr Check aPTT, heparin level in 8 hrs Monitor daily aPTT, heparin level, CBC while on IV heparin Turn off heparin infusion at midnight on 6/1 for procedure on 6/2 Monitor for bleeding  Gillermina Hu, PharmD, BCPS, [facility] Clinical Pharmacist 04/03/2021 8:07 PM

## 2021-04-03 NOTE — Plan of Care (Signed)
  Problem: Clinical Measurements: Goal: Respiratory complications will improve Outcome: Progressing   Problem: Activity: Goal: Risk for activity intolerance will decrease Outcome: Progressing   Problem: Coping: Goal: Level of anxiety will decrease Outcome: Progressing   Problem: Safety: Goal: Ability to remain free from injury will improve Outcome: Progressing   

## 2021-04-03 NOTE — Progress Notes (Addendum)
EP following along for procedure Thursday.  Coox 67% this am on dobutamine 2.5 mcg/kg/min   Feeling OK this am. Doesn't have any questions about the procedure at this time.    Eliquis on hold, covering with heparin.  Heparin to stop at midnight Wednesday and then eliquis to be resumed after once stable.    For questions or updates, please contact [place] Please consult [email or web address] for contact info under Cardiology/STEMI.  Signed, Shirley Friar, PA-C  04/03/2021, 8:00 AM

## 2021-04-04 DIAGNOSIS — N179 Acute kidney failure, unspecified: Secondary | ICD-10-CM | POA: Diagnosis not present

## 2021-04-04 DIAGNOSIS — I5084 End stage heart failure: Secondary | ICD-10-CM | POA: Diagnosis not present

## 2021-04-04 DIAGNOSIS — I5023 Acute on chronic systolic (congestive) heart failure: Secondary | ICD-10-CM | POA: Diagnosis not present

## 2021-04-04 DIAGNOSIS — I5021 Acute systolic (congestive) heart failure: Secondary | ICD-10-CM | POA: Diagnosis not present

## 2021-04-04 LAB — CBC
HCT: 31.6 % — ABNORMAL LOW (ref 36.0–46.0)
Hemoglobin: 10.2 g/dL — ABNORMAL LOW (ref 12.0–15.0)
MCH: 28.1 pg (ref 26.0–34.0)
MCHC: 32.3 g/dL (ref 30.0–36.0)
MCV: 87.1 fL (ref 80.0–100.0)
Platelets: 166 10*3/uL (ref 150–400)
RBC: 3.63 MIL/uL — ABNORMAL LOW (ref 3.87–5.11)
RDW: 16.2 % — ABNORMAL HIGH (ref 11.5–15.5)
WBC: 4.3 10*3/uL (ref 4.0–10.5)
nRBC: 0 % (ref 0.0–0.2)

## 2021-04-04 LAB — COOXEMETRY PANEL
Carboxyhemoglobin: 1.5 % (ref 0.5–1.5)
Methemoglobin: 0.8 % (ref 0.0–1.5)
O2 Saturation: 59.6 %
Total hemoglobin: 10.3 g/dL — ABNORMAL LOW (ref 12.0–16.0)

## 2021-04-04 LAB — BASIC METABOLIC PANEL
Anion gap: 5 (ref 5–15)
BUN: 34 mg/dL — ABNORMAL HIGH (ref 8–23)
CO2: 30 mmol/L (ref 22–32)
Calcium: 10.9 mg/dL — ABNORMAL HIGH (ref 8.9–10.3)
Chloride: 99 mmol/L (ref 98–111)
Creatinine, Ser: 1.75 mg/dL — ABNORMAL HIGH (ref 0.44–1.00)
GFR, Estimated: 29 mL/min — ABNORMAL LOW (ref >60)
Glucose, Bld: 130 mg/dL — ABNORMAL HIGH (ref 70–99)
Potassium: 3.4 mmol/L — ABNORMAL LOW (ref 3.5–5.1)
Sodium: 134 mmol/L — ABNORMAL LOW (ref 135–145)

## 2021-04-04 LAB — MAGNESIUM: Magnesium: 2 mg/dL (ref 1.7–2.4)

## 2021-04-04 LAB — APTT
aPTT: 60 seconds — ABNORMAL HIGH (ref 24–36)
aPTT: 58 seconds — ABNORMAL HIGH (ref 24–36)

## 2021-04-04 LAB — SURGICAL PCR SCREEN
MRSA, PCR: NEGATIVE
Staphylococcus aureus: POSITIVE — AB

## 2021-04-04 LAB — HEPARIN LEVEL (UNFRACTIONATED): Heparin Unfractionated: >1.1 IU/mL — ABNORMAL HIGH (ref 0.30–0.70)

## 2021-04-04 MED ORDER — POTASSIUM CHLORIDE CRYS ER 20 MEQ PO TBCR
30.0000 meq | EXTENDED_RELEASE_TABLET | Freq: Once | ORAL | Status: AC
  Administered 2021-04-04: 30 meq via ORAL
  Filled 2021-04-04: qty 1

## 2021-04-04 MED ORDER — CHLORHEXIDINE GLUCONATE CLOTH 2 % EX PADS: 6.0000 | MEDICATED_PAD | Freq: Every day | CUTANEOUS | Status: DC

## 2021-04-04 MED ORDER — MUPIROCIN 2 % EX OINT
1.0000 "application " | TOPICAL_OINTMENT | Freq: Two times a day (BID) | CUTANEOUS | Status: DC
  Administered 2021-04-05 – 2021-04-08 (×8): 1 via NASAL
  Filled 2021-04-04: qty 22

## 2021-04-04 NOTE — Progress Notes (Signed)
Nutrition Follow-up  DOCUMENTATION CODES:   Non-severe (moderate) malnutrition in context of chronic illness  INTERVENTION:   -Continue MVI with minerals daily -Continue Ensure Enlive po BID, each supplement provides 350 kcal and 20 grams of protein -Continue dysphagia 3 diet (advanced mechanical soft) for ease of intake  NUTRITION DIAGNOSIS:   Moderate Malnutrition related to chronic illness (CHF) as evidenced by mild fat depletion,moderate fat depletion,moderate muscle depletion,severe muscle depletion.  Ongoing  GOAL:   Patient will meet greater than or equal to 90% of their needs  Progressing   MONITOR:   PO intake,Supplement acceptance,Labs,Weight trends,Skin,I & O's  REASON FOR ASSESSMENT:   Consult Assessment of nutrition requirement/status  ASSESSMENT:   Holly [name] is a 80 y.o. 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  Reviewed I/O's: -302 ml x 24 hours and -1.3 L since admission  UOP: 1.6 L x 24 hours  Case discussed with electrophysiology PA; plan for CRT-P upgrade tomorrow (04/05/21).   Pt on phone at time of visit. RD did not disturb.   Pt with fair intake. Noted meal completion 50%. Pt consuming Ensure Enlive supplements pr MAR.  Medications reviewed and include miralax and senokot.   Labs reviewed: Na: 134, K: 3.4 (on PO supplementation).   Diet Order:   Diet Order            Diet clear liquid Room service appropriate? Yes; Fluid consistency: Thin  Diet effective midnight           DIET DYS 3 Fluid consistency: Thin  Diet effective now                 EDUCATION NEEDS:   Education needs have been addressed  Skin:  Skin Assessment: Reviewed RN Assessment  Last BM:  04/02/21  Height:   Ht Readings  from Last 1 Encounters:  03/27/21 '5\' 4"'$  (1.626 m)    Weight:   Wt Readings from Last 1 Encounters:  04/04/21 54 kg    Ideal Body Weight:  54.5 kg  BMI:  Body mass index is 20.43 kg/m.  Estimated Nutritional Needs:   Kcal:  1700-1900  Protein:  85-100 grams  Fluid:  > 1.7 L    Loistine Chance, RD, LDN, [place] Registered Dietitian II Certified Diabetes Care and Education Specialist Please refer to [facility] for RD and/or RD on-call/weekend/after hours pager

## 2021-04-04 NOTE — Progress Notes (Signed)
ANTICOAGULATION CONSULT NOTE - Follow Up Consult  Pharmacy Consult for heparin Indication: atrial fibrillation  Labs: Recent Labs    04/01/21 2043 04/02/21 0440 04/02/21 0440 04/03/21 0500 04/03/21 1800 04/04/21 0430 04/04/21 0500  HGB  --  9.9*   < > 10.2*  --   --  10.2*  HCT  --  30.9*  --  31.3*  --   --  31.6*  PLT  --  166  --  166  --   --  166  APTT  --   --   --  36 37* 60*  --   HEPARINUNFRC  --   --   --   --  >1.10* >1.10*  --   CREATININE 1.60* 1.52*  --   --   --   --   --    < > = values in this interval not displayed.    Assessment: 80yo female subtherapeutic on heparin after rate change though closer to goal; no gtt issues or signs of bleeding per RN.  Goal of Therapy:  aPTT 66-102 seconds   Plan:  Will increase heparin gtt by 1 unit/kg/hr to 750 units/hr and check level in 8 hours.    Wynona Neat, PharmD, BCPS  04/04/2021,5:56 AM

## 2021-04-04 NOTE — Progress Notes (Addendum)
Advanced Heart Failure Rounding Note   Subjective:    Remains on DBA at 2.5. Co-ox 60%  On torsemide 40 daily .   Feels ok. Denies SOB.    Objective:   Weight Range:  Vital Signs:   Temp:  [97.8 F (36.6 C)-98.2 F (36.8 C)] 98.2 F (36.8 C) (06/01 0400) Pulse Rate:  [64-73] 65 (06/01 0400) Resp:  [15-19] 16 (06/01 0400) BP: (91-107)/(50-62) 107/56 (06/01 0400) SpO2:  [98 %-100 %] 100 % (06/01 0400) Weight:  [54 kg] 54 kg (06/01 0400) Last BM Date: 04/02/21  Weight change: Filed Weights   04/02/21 0400 04/03/21 0500 04/04/21 0400  Weight: 53.9 kg 53.6 kg 54 kg    Intake/Output:   Intake/Output Summary (Last 24 hours) at 04/04/2021 0812 Last data filed at 04/04/2021 0440 Gross per 24 hour  Intake 1297.6 ml  Output 1600 ml  Net -302.4 ml    CVP 7  Physical Exam: General:  Sitting on the side of the bed. No resp difficulty HEENT: normal Neck: supple. no JVD. Carotids 2+ bilat; no bruits. No lymphadenopathy or thryomegaly appreciated. Cor: PMI nondisplaced. Regular rate & rhythm. No rubs, gallops or murmurs. Lungs: clear Abdomen: soft, nontender, nondistended. No hepatosplenomegaly. No bruits or masses. Good bowel sounds. Extremities: no cyanosis, clubbing, rash, edema Neuro: alert & orientedx3, cranial nerves grossly intact. moves all 4 extremities w/o difficulty. Affect pleasant   Telemetry: AV paced 60s  Labs: Basic Metabolic Panel: Recent Labs  Lab 03/30/21 0738 03/31/21 0429 04/01/21 0412 04/01/21 2043 04/02/21 0440 04/03/21 0500 04/04/21 0500  NA 135 135 134* 133* 134*  --   --   K 3.4* 3.6 3.5 4.5 4.3  --   --   CL 101 103 100 100 102  --   --   CO2 '23 26 27 27 25  ' --   --   GLUCOSE 85 93 98 137* 91  --   --   BUN 54* 46* 38* 33* 30*  --   --   CREATININE 1.78* 1.56* 1.59* 1.60* 1.52*  --   --   CALCIUM 10.7* 10.9* 10.7* 11.0* 10.9*  --   --   MG  --  2.0 1.9  --  2.0 2.1 2.0  PHOS  --  2.8  --   --   --   --   --     Liver Function  Tests: Recent Labs  Lab 03/28/21 0857 03/30/21 0738 03/31/21 0429 04/01/21 0412  AST 38 28  --  20  ALT 34 28  --  19  ALKPHOS 45 40  --  35*  BILITOT 1.7* 1.3*  --  1.1  PROT 7.0 6.9  --  6.6  ALBUMIN 3.1* 3.0* 2.8* 2.8*   No results for input(s): LIPASE, AMYLASE in the last 168 hours. No results for input(s): AMMONIA in the last 168 hours.  CBC: Recent Labs  Lab 03/31/21 0429 04/01/21 0412 04/02/21 0440 04/03/21 0500 04/04/21 0500  WBC 4.5 4.4 4.3 4.4 4.3  HGB 11.1* 10.3* 9.9* 10.2* 10.2*  HCT 34.1* 32.2* 30.9* 31.3* 31.6*  MCV 86.3 86.3 86.8 86.2 87.1  PLT 201 174 166 166 166    Cardiac Enzymes: No results for input(s): CKTOTAL, CKMB, CKMBINDEX, TROPONINI in the last 168 hours.  BNP: BNP (last 3 results) Recent Labs    06/05/20 1311 03/26/21 1523 03/30/21 0738  BNP 1,854.5* >4,500.0* >4,500.0*    ProBNP (last 3 results) Recent Labs    07/20/20  [place]*      Other results:  Imaging: No results found.   Medications:     Scheduled Medications: . vitamin C  1,000 mg Oral Daily  . Chlorhexidine Gluconate Cloth  6 each Topical Daily  . feeding supplement  237 mL Oral BID BM  . multivitamin with minerals  1 tablet Oral Daily  . polyethylene glycol  17 g Oral Daily  . rosuvastatin  10 mg Oral QPM  . senna-docusate  1 tablet Oral QHS  . sodium chloride flush  10-40 mL Intracatheter Q12H  . torsemide  40 mg Oral Daily    Infusions: . DOBUTamine 2.5 mcg/kg/min (04/02/21 0244)  . heparin 750 Units/hr (04/04/21 0641)    PRN Medications: acetaminophen, ALPRAZolam, sodium chloride flush   Assessment/Plan:   1. 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 Pt previously declined upgrade to CRT-P - Echo 07/15/2017: EF 65-70%, RV normal (PPM implanted 07/2017) - Echo 04/04/2020: EF 25-30%, RV normal  - Echo 03/27/2021: EF < 20%, global HK. RV normal. 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  - Volume status stable. CVP 7. Continue current dose of torsemide.  - BP soft. No room for arni/spiro.  - CHeck BMET now.  - Patient met with Dr. Quentin Ore yesterday and is now scheduled for CRT-P upgrade attempt on Thursday pending patency of left subclavian system - Patient and her daughters realize that her cardiomyopathy is quite advanced and may be too advanced to benefit from CRT at this time but it is our only durable option for an attempt at LV recovery.  2. AKI - prior SCr from 10/21 was 1.4 - BMET pending.   3. Mitral Regurgitation  - severe on Echo, likely functional. Hopefully will improve with CRT   4. CHB - s/p PPM   5. Hypokalemia - BMET pending.   6. CAD - records indicate mod LCx disease.  - No chest pain.   7. PAF Off eliquis for procedure. On heparin drip.   Off eliquis. On heparin drip for procedure.  Check BMET    Length of Stay: [place] NP_C  04/04/2021, 8:12 AM  Advanced Heart Failure Team Pager [phone] (M-F; 7a - 4p)  Please contact [facility] for night-coverage after hours (4p -7a ) and weekends on [email or web address]   Patient seen and examined with the above-signed Advanced Practice Provider and/or Housestaff. I personally reviewed laboratory data, imaging studies and relevant notes. I independently examined the patient and formulated the important aspects of the plan. I have edited the note to reflect any of my changes or salient points. I have personally discussed the plan with the patient and/or family.  Stable from a HF perspective.on DBA 2.5, Volume status looks good. Rhythm stable. BMET pending  General:  Thin elderly woman. Lying flat in bed  No resp difficulty HEENT: normal Neck: supple. no JVD. Carotids 2+ bilat; no bruits. No lymphadenopathy or thryomegaly appreciated. Cor: PMI nondisplaced. Regular rate & rhythm. 2/6 MR Lungs:  clear Abdomen: soft, nontender, nondistended. No hepatosplenomegaly. No bruits or masses. Good bowel sounds. Extremities: no cyanosis, clubbing, rash, edema Neuro: alert & orientedx3, cranial nerves grossly intact. moves all 4 extremities w/o difficulty. Affect pleasant  Remains stable on DBA 2.5. Co-ox 60%. For attempt at CRT-P upgrade tomorrow with Dr. Quentin Ore.   Glori Bickers,  MD  8:50 AM

## 2021-04-04 NOTE — Progress Notes (Signed)
ANTICOAGULATION CONSULT NOTE - Follow Up Consult  Pharmacy Consult for Apixaban >> IV Heparin Indication:  Bridge while apixaban is being held (atrial fibrillation)  Allergies  Allergen Reactions  . Entresto [Sacubitril-Valsartan] Itching, Rash and Cough  . Lisinopril Cough  . Venlafaxine Rash    Patient Measurements: Height: '5\' 4"'$  (162.6 cm) Weight: 54 kg (119 lb 0.8 oz) IBW/kg (Calculated) : 54.7 Heparin Dosing Weight: 50 kg  Vital Signs: Temp: 98.2 F (36.8 C) (06/01 0829) Temp Source: Oral (06/01 0829) BP: 110/59 (06/01 0922) Pulse Rate: 76 (06/01 0922)  Labs: Recent Labs    04/01/21 2043 04/02/21 0440 04/02/21 0440 04/03/21 0500 04/03/21 1800 04/04/21 0430 04/04/21 0500 04/04/21 0807 04/04/21 1614  HGB  --  9.9*   < > 10.2*  --   --  10.2*  --   --   HCT  --  30.9*  --  31.3*  --   --  31.6*  --   --   PLT  --  166  --  166  --   --  166  --   --   APTT  --   --    < > 36 37* 60*  --   --  58*  HEPARINUNFRC  --   --   --   --  >1.10* >1.10*  --   --   --   CREATININE 1.60* 1.52*  --   --   --   --   --  1.75*  --    < > = values in this interval not displayed.    Estimated Creatinine Clearance: 21.9 mL/min (A) (by C-G formula based on SCr of 1.75 mg/dL (H)).   Medical History: Past Medical History:  Diagnosis Date  . AV block, 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[place]) 07/15/2017  . Bronchitis   . Encounter for care of pacemaker 05/12/2019  . Hypertension   . Pacemaker - MRI safe Med[employer]onic Azure XT DR MRI [id number] Dual chamber PPM in situ 04/24/2019    Assessment: 80 year old female on apixaban PTA for atrial fibrillation. Noted plans for biV upgrade on Thursday 6/2. Orders to stop apixaban on 5/30 and start heparin bridge in the morning of 5/31 when apixaban would be due. Further orders to stop IV heparin bridge at midnight on Wednesday 6/1 prior to procedure. Stop dates have been entered accordingly.   Given recent apixaban dosing, will monitor anticoagulation using aPTT until  aPTT and heparin levels correlate.  aPTT of 58 is subtherapeutic and decreased slightly after rate increase earlier to heparin 750 units/hr. Level drawn appropriately. Per RN, no issues with IV access or infusion though heparin infusion was transferred from central line to PIV at ~1300 today. No noted bleeding.    Goal of Therapy:  Heparin level 0.3-0.7 units/ml aPTT 66-102 seconds Monitor platelets by anticoagulation protocol: Yes   Plan:  Increase heparin to 800 units/hr Turn off heparin infusion at midnight on 6/1 for procedure on 6/2  No repeat aPTT given turning heparin off  Monitor daily aPTT, heparin level, CBC while on IV heparin   Cristela Felt, PharmD Clinical Pharmacist  04/04/2021 6:19 PM

## 2021-04-04 NOTE — Progress Notes (Signed)
Subjective:  Feels well Appetite has improved  Objective:  Vital Signs in the last 24 hours: Temp:  [97.8 F (36.6 C)-98.2 F (36.8 C)] 98.2 F (36.8 C) (06/01 0829) Pulse Rate:  [64-73] 67 (06/01 0829) Resp:  [15-19] 15 (06/01 0829) BP: (89-107)/(50-62) 89/57 (06/01 0829) SpO2:  [98 %-100 %] 100 % (06/01 0829) Weight:  [54 kg] 54 kg (06/01 0400)  Intake/Output from previous day: 05/31 0701 - 06/01 0700 In: 1297.6 [P.O.:1137; I.V.:160.6] Out: 1600 [Urine:1600]  Physical Exam Vitals and nursing note reviewed.  Constitutional:      General: She is not in acute distress.    Appearance: She is well-developed. She is cachectic.  HENT:     Head: Normocephalic and atraumatic.  Eyes:     Conjunctiva/sclera: Conjunctivae normal.     Pupils: Pupils are equal, round, and reactive to light.  Neck:     Vascular: JVD present.  Cardiovascular:     Rate and Rhythm: Normal rate and regular rhythm.     Pulses: Normal pulses and intact distal pulses.     Heart sounds: No murmur heard.   Pulmonary:     Effort: Pulmonary effort is normal.     Breath sounds: Examination of the right-middle field reveals rales. Examination of the right-lower field reveals rales. Rales present. No wheezing.  Abdominal:     General: Bowel sounds are normal.     Palpations: Abdomen is soft.     Tenderness: There is no rebound.  Musculoskeletal:        General: No tenderness. Normal range of motion.     Right lower leg: No edema.     Left lower leg: No edema.  Lymphadenopathy:     Cervical: No cervical adenopathy.  Skin:    General: Skin is warm and dry.  Neurological:     Mental Status: She is alert and oriented to person, place, and time.     Cranial Nerves: No cranial nerve deficit.      Lab Results: BMP Recent Labs    05/19/20 1114 06/05/20 1311 08/25/20 1601 03/26/21 1523 04/01/21 0412 04/01/21 2043 04/02/21 0440  NA 130* 131* 132*   < > 134* 133* 134*  K 4.7 4.5 4.8   < > 3.5 4.5  4.3  CL 95* 97 98   < > 100 100 102  CO2 17* 21 20   < > '27 27 25  '$ GLUCOSE 99 79 109*   < > 98 137* 91  BUN 18 25 37*   < > 38* 33* 30*  CREATININE 1.21* 1.29* 1.36*   < > 1.59* 1.60* 1.52*  CALCIUM 12.1* 11.9* 10.4*   < > 10.7* 11.0* 10.9*  GFRNONAA 42* 39* 37*   < > 33* 32* 34*  GFRAA 49* 45* 42*  --   --   --   --    < > = values in this interval not displayed.    CBC Recent Labs  Lab 04/04/21 0500  WBC 4.3  RBC 3.63*  HGB 10.2*  HCT 31.6*  PLT 166  MCV 87.1  MCH 28.1  MCHC 32.3  RDW 16.2*     BNP (last 3 results) Recent Labs    06/05/20 1311 03/26/21 1523 03/30/21 0738  BNP 1,854.5* >4,500.0* >4,500.0*    Lipid Panel     Component Value Date/Time   CHOL 145 11/01/2019 1126   TRIG 49 11/01/2019 1126   HDL 76 11/01/2019 1126   CHOLHDL 1.9 11/01/2019 1126  [place] 58 11/01/2019 1126     Hepatic Function Panel Recent Labs    03/28/21 0857 03/30/21 0738 03/31/21 0429 04/01/21 0412  PROT 7.0 6.9  --  6.6  ALBUMIN 3.1* 3.0* 2.8* 2.8*  AST 38 28  --  20  ALT 34 28  --  19  ALKPHOS 45 40  --  35*  BILITOT 1.7* 1.3*  --  1.1    CARDIAC STUDIES:  EKG 03/27/2021: AV dual paced rhythm  1 Brief NSVT episode on pacemaker interrogation on 02/14/2021 No recent alert  Echocardiogram 03/27/2021: 1. Left ventricular ejection fraction, by estimation, is <20%. The left  ventricle has severely decreased function. The left ventricle demonstrates  global hypokinesis. There is mild left ventricular hypertrophy. Left  ventricular diastolic parameters are  indeterminate.  2. Right ventricular systolic function is normal. The right ventricular  size is normal.  3. Left atrial size was severely dilated.  4. Right atrial size was severely dilated.  5. The mitral valve is normal in structure. Severe mitral valve  regurgitation. No evidence of mitral stenosis.  6. Tricuspid valve regurgitation is severe.  7. The aortic valve is normal in structure. There  is mild calcification  of the aortic valve. There is mild thickening of the aortic valve. Aortic  valve regurgitation is trivial. Mild to moderate aortic valve  sclerosis/calcification is present, without  any evidence of aortic stenosis.  8. The inferior vena cava is dilated in size with <50% respiratory  variability, suggesting right atrial pressure of 15 mmHg.   Assessment & Recommendations:  80 year old [place] American female with hypertension, dual-chamber pacemaker placement for AV block, moderate, medically managed coronary artery disease (LCx), hypertension, NSVT, HFrEF  Acute on chronic systolic heart failure: Acute decompensation. She has cardiac cachexia, with rales on exam. No other overt edema. LVEF <20%. 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 Continue dobutamine, follow co-ox. Plan for CRT-P on 6/2, subject to subclavian vein patency. Appreciate heart failure and EP input.  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 Appreciate nutrition consult  AKI: Cardiorenal syndrome. AKi now resolved, Cr stable Ordered BMP for this morning  PAF: Continue eliquis 2.5 mg bid Interruption recommendation as per EP. Continue heprain   Nigel Mormon, MD Pager: [phone] Office: [phone]

## 2021-04-04 NOTE — Progress Notes (Signed)
Occupational Therapy Treatment Patient Details Name: Holly [name] MRN: [id number] DOB: 1941-09-28 Today's Date: 04/04/2021    History of present illness Pt is an 80 y/o female admitted 5/23 secondary to abnormal labs and AKI. 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 PMH includes a fib, CHF, s/p pacemaker, HTN, and CKD.   OT comments  Pt received in recliner having just returned from bathroom but agreeable to OT intervention. Pt continues to present with impaired balance, decreased activity tolerance, and generalized deconditioning. Pt currently requires [place] for functional mobility and MINA for LB ADLs. Pt present with kyphotic posture preferring to lean on RW during functional mobility. Pt would continue to benefit from skilled occupational therapy while admitted and after d/c to address the below listed limitations in order to improve overall functional mobility and facilitate independence with BADL participation. DC plan remains appropriate, will follow acutely per POC.    Follow Up Recommendations  Home health OT;Supervision - Intermittent;Other (comment) ([place] aide)    Equipment Recommendations  None recommended by OT    Recommendations for Other Services      Precautions / Restrictions Precautions Precautions: Fall Restrictions Weight Bearing Restrictions: No       Mobility Bed Mobility Overal bed mobility: Needs Assistance Bed Mobility: Sit to Supine     Supine to sit: Min assist;HOB elevated Sit to supine: Min assist;HOB elevated   General bed mobility comments: light MIN A to elevate BLEs to EOB    Transfers Overall transfer level: Needs assistance Equipment used: Rolling walker (2 wheeled) Transfers: Sit to/from [facility] Sit to Stand: Min assist Stand pivot transfers: Min assist       General transfer comment: x3 sit<>stands from recliner with min A for safety, pt preferring to lean on RW once standing, pt able to pivot back to bed from recliner  with min assist with no AD    Balance Overall balance assessment: Needs assistance Sitting-balance support: No upper extremity supported;Feet supported Sitting balance-Leahy Scale: Good Sitting balance - Comments: kyphotic posture; able to reach down and don brief with no LOB   Standing balance support: Single extremity supported Standing balance-Leahy Scale: Poor Standing balance comment: benefits from at least one UE supported                           ADL either performed or assessed with clinical judgement   ADL Overall ADL's : Needs assistance/impaired                     Lower Body Dressing: Minimal assistance;Sit to/from stand Lower Body Dressing Details (indicate cue type and reason): to don brief from chair, light MIN A to assist with threading LLE, assist needed to pull brief up to waist line Toilet Transfer: Stand-pivot;Minimal assistance Toilet Transfer Details (indicate cue type and reason): simulated via stand pivot from recliner>EOB         Functional mobility during ADLs: Minimal assistance General ADL Comments: pt completed x3 sit<>stands, ADL transfer, seated therex and LB dressing     Vision       Perception     Praxis      Cognition Arousal/Alertness: Awake/alert Behavior During Therapy: WFL for tasks assessed/performed Overall Cognitive Status: Within Functional Limits for tasks assessed                                 General Comments:  overall WFL for simple mobility tasks        Exercises General Exercises - Lower Extremity Long Arc Quad: Strengthening;Both;20 reps;Seated Hip Flexion/Marching: Strengthening;Both;10 reps;Seated   Shoulder Instructions       General Comments VSS on RA, pt reports her family fixes her meals and she spends most of her time in her room, she reports having plenty of people around to help her.     Pertinent Vitals/ Pain       Pain Assessment: No/denies pain  Home Living                                           Prior Functioning/Environment              Frequency  Min 2X/week        Progress Toward Goals  OT Goals(current goals can now be found in the care plan section)  Progress towards OT goals: Progressing toward goals  Acute Rehab OT Goals Patient Stated Goal: to go home OT Goal Formulation: With patient Time For Goal Achievement: 04/13/21 Potential to Achieve Goals: [place] Discharge plan remains appropriate;Frequency remains appropriate    Co-evaluation                 AM-PAC OT "6 Clicks" Daily Activity     Outcome Measure   Help from another person eating meals?: None Help from another person taking care of personal grooming?: A Little Help from another person toileting, which includes using toliet, bedpan, or urinal?: A Little Help from another person bathing (including washing, rinsing, drying)?: A Little Help from another person to put on and taking off regular upper body clothing?: None Help from another person to put on and taking off regular lower body clothing?: A Little 6 Click Score: 20    End of Session Equipment Utilized During Treatment: Rolling walker  OT Visit Diagnosis: Unsteadiness on feet (R26.81);Muscle weakness (generalized) (M62.81)   Activity Tolerance Patient tolerated treatment well   Patient Left in bed;with call bell/phone within reach;with bed alarm set   Nurse Communication Mobility status        Time: [id number] OT Time Calculation (min): 27 min  Charges: OT General Charges $OT Visit: 1 Visit OT Treatments $Self Care/Home Management : 23-37 mins  Holly [name]., COTA/L Acute Rehabilitation Services [phone]    Holly [name] 04/04/2021, 4:26 PM

## 2021-04-04 NOTE — Plan of Care (Signed)
  Problem: Pain Managment: Goal: General experience of comfort will improve Outcome: Progressing   Problem: Safety: Goal: Ability to remain free from injury will improve Outcome: Progressing   

## 2021-04-04 NOTE — Progress Notes (Signed)
[facility] PROGRESS NOTE    Progress Note  Holly Holly [name]  [id number] DOB: 06/01/1941 DOA: 03/26/2021 PCP: Holly Seashore, MD     Brief Narrative:   Holly Holly [name] is an 80 y.o. female past medical history significant for heart block status post pacemaker, paroxysmal atrial fibrillation on Eliquis combined systolic and diastolic heart failure who comes in from Holly PCPs office due to abnormal labs when I saw her PCP due to increased shortness of breath for Holly last 2 months was found to be in acute combined systolic and diastolic heart failure and acute kidney injury sent to Holly ED, with CT of Holly abdomen pelvis showing anasarca BNP greater than 4000 and creatinine of 2.3 cardiology was consulted  Significant studies: CT of Holly abdomen and pelvis cholelithiasis but no acute cholecystitis, with anasarca of Holly abdominal wall and mesenteric lymphatics  Antibiotics: None  Microbiology data: Blood culture:  Procedures: None  Assessment/Plan:   Acute on chronic combined systolic and diastolic heart failure: Repeated 2D echo showed an EF of 20%. PICC line was placed she was started on dobutamine and torsemide Holly advanced heart failure team was consulted. Continue strict I's and O's and daily weights. Palliative care has been consulted to address CODE STATUS as she is full code. Call oximetry this morning is 60% continue further management per advanced heart failure team. 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 Holly [name] and daughter realized that her advanced cardiomyopathy is nonreversible and may be too advanced to benefit from CRT. He discontinued on 04/04/2021 for procedure.  Acute kidney injury on chronic kidney disease stage IIIa/cardiorenal syndrome: Last creatinine in October 2021 was 1.4.  ARB was held On dobutamine and  torsemide is now ranging 1.5-1.7.  Severe mitral regurgitation: Seen on echo likely functional hopefully CRT will help improve some of this Holly dysfunction.  Paroxysmal atrial fibrillation: Due to hypotension beta-blocker has been discontinued, on IV heparin today.  Cholelithiasis without cholecystitis: Noted.  Incidental left upper pole kidney cyst: Renal ultrasound showed that it was relatively benign no further work-up as an outpatient.  Essential hypertension: Losartan and metoprolol were held due to acute kidney injury and heart failure decompensation.    DVT prophylaxis: IV heparin Family Communication:none Status is: Inpatient  Remains inpatient appropriate because:Hemodynamically unstable   Dispo: Holly Holly [name]              Anticipated d/c is to: SNF              Holly [name] is not medically stable to d/c.   Difficult to place Holly [name] No        Code Status:     Code Status Orders  (From admission, onward)         Start     Ordered   03/26/21 2244  Full code  Continuous        03/26/21 2243        Code Status History    Date Active Date Inactive Code Status Order ID Comments User Context   11/11/2017 1704 11/11/2017 2339 Full Code [id number]  Holly Mormon, MD Inpatient   07/15/2017 1218 07/16/2017 1743 Full Code [id number]  Holly Mormon, MD ED   Advance Care Planning Activity        IV Access:    Peripheral IV   Procedures and diagnostic studies:  No results found.   Medical Consultants:    None.   Subjective:    Holly Holly [name] no new complaints will be nervous about her procedure tomorrow.  Objective:    Vitals:   04/04/21 0000 04/04/21 0400 04/04/21 0829 04/04/21 0922  BP: 107/62 (!) 107/56 (!) 89/57 (!) 110/59  Pulse: 73 65 67 76  Resp: '19 16 15 16  '$ Temp: 98 F (36.7 C) 98.2 F (36.8 C) 98.2 F (36.8 C)   TempSrc: Oral Oral Oral   SpO2: 98% 100% 100% 100%  Weight:  54 kg    Height:        SpO2: 100 %   Intake/Output Summary (Last 24 hours) at 04/04/2021 1039 Last data filed at 04/04/2021 0832 Gross per 24 hour  Intake 1197.6 ml  Output 1600 ml  Net -402.4 ml   Filed Weights   04/02/21 0400 04/03/21 0500 04/04/21 0400  Weight: 53.9 kg 53.6 kg 54 kg    Exam: General exam: In no acute distress. Respiratory system: Good air movement and clear to auscultation. Cardiovascular system: S1 & S2 heard, RRR. No JVD. Gastrointestinal system: Abdomen is nondistended, soft and nontender.  Extremities: No pedal edema. Skin: No rashes, lesions or ulcers Psychiatry: Judgement and insight appear normal. Mood & affect appropriate.    Data Reviewed:    Labs: Basic Metabolic Panel: Recent Labs  Lab 03/31/21 0429 04/01/21 0412 04/01/21 2043 04/02/21 0440 04/03/21 0500 04/04/21 0500 04/04/21 0807  NA 135 134* 133* 134*  --   --  134*  K 3.6 3.5 4.5 4.3  --   --  3.4*  CL 103 100 100 102  --   --  99  CO2 '26 27 27 25  '$ --   --  30  GLUCOSE 93 98 137* 91  --   --  130*  BUN 46* 38* 33* 30*  --   --  34*  CREATININE 1.56* 1.59* 1.60* 1.52*  --   --  1.75*  CALCIUM 10.9* 10.7* 11.0* 10.9*  --   --  10.9*  MG 2.0 1.9  --  2.0 2.1 2.0  --   PHOS 2.8  --   --   --   --   --   --    GFR Estimated Creatinine Clearance: 21.9 mL/min (A) (by C-G formula based on SCr of 1.75 mg/dL (H)). Liver Function Tests: Recent Labs  Lab 03/30/21 0738 03/31/21 0429 04/01/21 0412  AST 28  --  20  ALT 28  --  19  ALKPHOS 40  --  35*  BILITOT 1.3*  --  1.1  PROT 6.9  --  6.6  ALBUMIN 3.0* 2.8* 2.8*   No results for input(s): LIPASE, AMYLASE in Holly last 168 hours. No results for input(s): AMMONIA in Holly last 168 hours. Coagulation profile No results for input(s): INR, PROTIME in Holly last 168 hours. COVID-19 Labs  No results for input(s): DDIMER, FERRITIN, LDH, CRP in Holly last 72 hours.  Lab Results  Component Value Date   [place] NEGATIVE 03/26/2021    CBC: Recent Labs   Lab 03/31/21 0429 04/01/21 0412 04/02/21 0440 04/03/21 0500 04/04/21 0500  WBC 4.5 4.4 4.3 4.4 4.3  HGB 11.1* 10.3* 9.9* 10.2* 10.2*  HCT 34.1* 32.2* 30.9* 31.3* 31.6*  MCV 86.3 86.3 86.8 86.2 87.1  PLT 201 174 166 166 166   Cardiac Enzymes: No results for input(s): CKTOTAL, CKMB, CKMBINDEX, TROPONINI in Holly last 168 hours. BNP (last 3  results) Recent Labs    07/20/20 1201  PROBNP 9,907*   CBG: No results for input(s): GLUCAP in Holly last 168 hours. D-Dimer: No results for input(s): DDIMER in Holly last 72 hours. Hgb A1c: No results for input(s): HGBA1C in Holly last 72 hours. Lipid Profile: No results for input(s): CHOL, HDL, LDLCALC, TRIG, CHOLHDL, LDLDIRECT in Holly last 72 hours. Thyroid function studies: No results for input(s): TSH, T4TOTAL, T3FREE, THYROIDAB in Holly last 72 hours.  Invalid input(s): FREET3 Anemia work up: No results for input(s): VITAMINB12, FOLATE, FERRITIN, TIBC, IRON, RETICCTPCT in Holly last 72 hours. Sepsis Labs: Recent Labs  Lab 03/30/21 0322 03/30/21 0738 03/31/21 0429 04/01/21 0412 04/02/21 0440 04/03/21 0500 04/04/21 0500  WBC  --   --    < > 4.4 4.3 4.4 4.3  LATICACIDVEN 1.2 1.3  --   --   --   --   --    < > = values in this interval not displayed.   Microbiology Recent Results (from Holly past 240 hour(s))  Resp Panel by RT-PCR (Flu A&B, Covid) Nasopharyngeal Swab     Status: None   Collection Time: 03/26/21 11:43 PM   Specimen: Nasopharyngeal Swab; Nasopharyngeal(NP) swabs in vial transport medium  Result Value Ref Range Status   SARS Coronavirus 2 by RT PCR NEGATIVE NEGATIVE Final    Comment: (NOTE) SARS-CoV-2 target nucleic acids are NOT DETECTED.  Holly SARS-CoV-2 RNA is generally detectable in upper respiratory specimens during Holly acute phase of infection. Holly lowest concentration of SARS-CoV-2 viral copies this assay can detect is 138 copies/mL. A negative result does not preclude SARS-Cov-2 infection and should not be used  as Holly sole basis for treatment or other Holly [name] management decisions. A negative result may occur with  improper specimen collection/handling, submission of specimen other than nasopharyngeal swab, presence of viral mutation(s) within Holly areas targeted by this assay, and inadequate number of viral copies(<138 copies/mL). A negative result must be combined with clinical observations, Holly [name] history, and epidemiological information. Holly expected result is Negative.  Fact Sheet for Patients:  EntrepreneurPulse.com.au  Fact Sheet for Healthcare Providers:  IncredibleEmployment.be  This test is no t yet approved or cleared by [place] FDA and  has been authorized for detection and/or diagnosis of SARS-CoV-2 by FDA under an Emergency Use Authorization (EUA). This EUA will remain  in effect (meaning this test can be used) for Holly duration of Holly COVID-19 declaration under Section 564(b)(1) of Holly Act, 21 U.S.C.section 360bbb-3(b)(1), unless Holly authorization is terminated  or revoked sooner.       Influenza A by PCR NEGATIVE NEGATIVE Final   Influenza B by PCR NEGATIVE NEGATIVE Final    Comment: (NOTE) Holly Xpert Xpress SARS-CoV-2/FLU/RSV plus assay is intended as an aid in Holly diagnosis of influenza from Nasopharyngeal swab specimens and should not be used as a sole basis for treatment. Nasal washings and aspirates are unacceptable for Xpert Xpress SARS-CoV-2/FLU/RSV testing.  Fact Sheet for Patients: EntrepreneurPulse.com.au  Fact Sheet for Healthcare Providers: IncredibleEmployment.be  This test is not yet approved or cleared by [place] FDA and has been authorized for detection and/or diagnosis of SARS-CoV-2 by FDA under an Emergency Use Authorization (EUA). This EUA will remain in effect (meaning this test can be used) for Holly duration of Holly COVID-19 declaration under Section 564(b)(1)  of Holly Act, 21 U.S.C. section 360bbb-3(b)(1), unless Holly authorization is terminated or revoked.  Performed at [facility], [place] [address],  [place], [place] [address]      Medications:   . vitamin C  1,000 mg Oral Daily  . Chlorhexidine Gluconate Cloth  6 each Topical Daily  . feeding supplement  237 mL Oral BID BM  . multivitamin with minerals  1 tablet Oral Daily  . polyethylene glycol  17 g Oral Daily  . rosuvastatin  10 mg Oral QPM  . senna-docusate  1 tablet Oral QHS  . sodium chloride flush  10-40 mL Intracatheter Q12H  . torsemide  40 mg Oral Daily   Continuous Infusions: . DOBUTamine 2.5 mcg/kg/min (04/02/21 0244)  . heparin 750 Units/hr (04/04/21 0641)      LOS: 8 days   [facility]  04/04/2021, 10:39 AM

## 2021-04-04 NOTE — Progress Notes (Signed)
Physical Therapy Treatment Patient Details Name: Holly [name] MRN: [id number] DOB: 1941-01-22 Today's Date: 04/04/2021    History of Present Illness Pt is an 80 y/o female admitted 5/23 secondary to abnormal labs and AKI. 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 PMH includes a fib, CHF, s/p pacemaker, HTN, and CKD.    PT Comments    Pt with improved activity tolerance compared to previous session, however remains limited by neuromuscular fatigue. Pt reports fatigue in UE musculature, limiting ability to push through RW, rather leaning on RW through forearms. Pt also continues to demonstrate mild posterior lean during transfer attempts, needing assistance at times to power into standing or for safety. Pt will benefit from continued aggressive mobilization and PT POC to improve activity tolerance and independence in household mobility. PT updates recommendations to SNF as pt is unable to identify any consistent caregiver support and remains at a risk for falls with longer durations of mobility or when performing ADLs.  Follow Up Recommendations  SNF (pt agreeable to SNF this session, will benefit from HHPT if refusing SNF)     Equipment Recommendations  Wheelchair (measurements PT);Wheelchair cushion (measurements PT);3in1 (PT)    Recommendations for Other Services       Precautions / Restrictions Precautions Precautions: Fall Restrictions Weight Bearing Restrictions: No    Mobility  Bed Mobility Overal bed mobility: Needs Assistance Bed Mobility: Supine to Sit     Supine to sit: Min assist;HOB elevated          Transfers Overall transfer level: Needs assistance Equipment used: Rolling walker (2 wheeled) Transfers: Sit to/from Stand Sit to Stand: Min assist;Min guard         General transfer comment: initially requiring minA to power up from bed, minG for 5x sit to stand with encouragement of anterior lean  Ambulation/Gait Ambulation/Gait assistance: Min guard Gait  Distance (Feet): 60 Feet Assistive device: Rolling walker (2 wheeled) Gait Pattern/deviations: Step-to pattern;Trunk flexed Gait velocity: reduced Gait velocity interpretation: <1.8 ft/sec, indicate of risk for recurrent falls General Gait Details: pt with slow step-to gait, increased trunk flexion, pt utilizing RW similaryl to platform walker, leaning through forearms onto RW. Pt reports this is her baseline, denies being able to extend UE and push through RW with hands due to UE fatigue   Stairs             Wheelchair Mobility    Modified Rankin (Stroke Patients Only)       Balance Overall balance assessment: Needs assistance Sitting-balance support: No upper extremity supported;Feet supported Sitting balance-Leahy Scale: Good     Standing balance support: Bilateral upper extremity supported Standing balance-Leahy Scale: Poor Standing balance comment: Reliant on BUE support                            Cognition Arousal/Alertness: Awake/alert Behavior During Therapy: WFL for tasks assessed/performed Overall Cognitive Status: Within Functional Limits for tasks assessed                                        Exercises      General Comments General comments (skin integrity, edema, etc.): VSS on RA      Pertinent Vitals/Pain Pain Assessment: No/denies pain    Home Living  Prior Function            PT Goals (current goals can now be found in the care plan section) Acute Rehab PT Goals Patient Stated Goal: to go to rehab Progress towards PT goals: Progressing toward goals    Frequency    Min 3X/week (maintain 3x until SNF confirmed, also possible progression to D/C home)      PT Plan Current plan remains appropriate    Co-evaluation              AM-PAC PT "6 Clicks" Mobility   Outcome Measure  Help needed turning from your back to your side while in a flat bed without using bedrails?:  A Little Help needed moving from lying on your back to sitting on the side of a flat bed without using bedrails?: A Little Help needed moving to and from a bed to a chair (including a wheelchair)?: A Little Help needed standing up from a chair using your arms (e.g., wheelchair or bedside chair)?: A Little Help needed to walk in hospital room?: A Little Help needed climbing 3-5 steps with a railing? : A Lot 6 Click Score: 17    End of Session   Activity Tolerance: Patient limited by fatigue Patient left: in chair;with call bell/phone within reach Nurse Communication: Mobility status PT Visit Diagnosis: Unsteadiness on feet (R26.81);Muscle weakness (generalized) (M62.81);Difficulty in walking, not elsewhere classified (R26.2)     Time: 1356-1430 PT Time Calculation (min) (ACUTE ONLY): 34 min  Charges:  $Gait Training: 8-22 mins $Therapeutic Activity: 8-22 mins                     Zenaida Niece, PT, DPT Acute Rehabilitation Pager: [phone]    Zenaida Niece 04/04/2021, 2:44 PM

## 2021-04-05 ENCOUNTER — Other Ambulatory Visit: Payer: Self-pay | Admitting: Cardiology

## 2021-04-05 ENCOUNTER — Inpatient Hospital Stay (HOSPITAL_COMMUNITY)
Admission: EM | Disposition: A | Discharge status: Home / Self Care | Payer: Self-pay | Source: Home / Self Care | Attending: Family Medicine

## 2021-04-05 ENCOUNTER — Ambulatory Visit (HOSPITAL_COMMUNITY): Admission: RE | Admit: 2021-04-05 | Payer: Medicare Other | Source: Home / Self Care | Admitting: Cardiology

## 2021-04-05 DIAGNOSIS — I480 Paroxysmal atrial fibrillation: Secondary | ICD-10-CM

## 2021-04-05 DIAGNOSIS — I5021 Acute systolic (congestive) heart failure: Secondary | ICD-10-CM | POA: Diagnosis not present

## 2021-04-05 DIAGNOSIS — N179 Acute kidney failure, unspecified: Secondary | ICD-10-CM | POA: Diagnosis not present

## 2021-04-05 DIAGNOSIS — I442 Atrioventricular block, complete: Secondary | ICD-10-CM

## 2021-04-05 HISTORY — PX: BIV UPGRADE: EP1202

## 2021-04-05 HISTORY — PX: UPPER EXTREMITY VENOGRAPHY: CATH118272

## 2021-04-05 LAB — CBC
HCT: 31 % — ABNORMAL LOW (ref 36.0–46.0)
Hemoglobin: 10 g/dL — ABNORMAL LOW (ref 12.0–15.0)
MCH: 28 pg (ref 26.0–34.0)
MCHC: 32.3 g/dL (ref 30.0–36.0)
MCV: 86.8 fL (ref 80.0–100.0)
Platelets: 163 10*3/uL (ref 150–400)
RBC: 3.57 MIL/uL — ABNORMAL LOW (ref 3.87–5.11)
RDW: 16.3 % — ABNORMAL HIGH (ref 11.5–15.5)
WBC: 4.2 10*3/uL (ref 4.0–10.5)
nRBC: 0 % (ref 0.0–0.2)

## 2021-04-05 LAB — BASIC METABOLIC PANEL
Anion gap: 10 (ref 5–15)
BUN: 36 mg/dL — ABNORMAL HIGH (ref 8–23)
CO2: 26 mmol/L (ref 22–32)
Calcium: 10.7 mg/dL — ABNORMAL HIGH (ref 8.9–10.3)
Chloride: 96 mmol/L — ABNORMAL LOW (ref 98–111)
Creatinine, Ser: 1.84 mg/dL — ABNORMAL HIGH (ref 0.44–1.00)
GFR, Estimated: 27 mL/min — ABNORMAL LOW (ref >60)
Glucose, Bld: 79 mg/dL (ref 70–99)
Potassium: 3.6 mmol/L (ref 3.5–5.1)
Sodium: 132 mmol/L — ABNORMAL LOW (ref 135–145)

## 2021-04-05 LAB — MAGNESIUM: Magnesium: 2.1 mg/dL (ref 1.7–2.4)

## 2021-04-05 LAB — APTT: aPTT: 84 seconds — ABNORMAL HIGH (ref 24–36)

## 2021-04-05 SURGERY — UPPER EXTREMITY VENOGRAPHY
Anesthesia: LOCAL

## 2021-04-05 MED ORDER — SODIUM CHLORIDE 0.9 % IV SOLN
INTRAVENOUS | Status: AC
  Filled 2021-04-05: qty 2

## 2021-04-05 MED ORDER — IOHEXOL 350 MG/ML SOLN
INTRAVENOUS | Status: DC | PRN
  Administered 2021-04-05: 5 mL
  Administered 2021-04-05: 15 mL

## 2021-04-05 MED ORDER — POTASSIUM CHLORIDE CRYS ER 20 MEQ PO TBCR
40.0000 meq | EXTENDED_RELEASE_TABLET | Freq: Once | ORAL | Status: AC
  Administered 2021-04-05: 40 meq via ORAL
  Filled 2021-04-05: qty 2

## 2021-04-05 MED ORDER — CHLORHEXIDINE GLUCONATE 4 % EX LIQD
60.0000 mL | Freq: Once | CUTANEOUS | Status: AC
  Administered 2021-04-05: 4 via TOPICAL

## 2021-04-05 MED ORDER — LIDOCAINE HCL (PF) 1 % IJ SOLN
INTRAMUSCULAR | Status: DC | PRN
  Administered 2021-04-05: 60 mL

## 2021-04-05 MED ORDER — CEFAZOLIN SODIUM-DEXTROSE 2-4 GM/100ML-% IV SOLN
2.0000 g | INTRAVENOUS | Status: AC
  Administered 2021-04-05: 2 g via INTRAVENOUS

## 2021-04-05 MED ORDER — HEPARIN (PORCINE) IN NACL 1000-0.9 UT/500ML-% IV SOLN
INTRAVENOUS | Status: DC | PRN
  Administered 2021-04-05: 500 mL

## 2021-04-05 MED ORDER — CEFAZOLIN SODIUM-DEXTROSE 2-4 GM/100ML-% IV SOLN
2.0000 g | INTRAVENOUS | Status: DC
Start: 2021-04-05 — End: 2021-04-05

## 2021-04-05 MED ORDER — ACETAMINOPHEN 325 MG PO TABS
325.0000 mg | ORAL_TABLET | ORAL | Status: DC | PRN
  Administered 2021-04-05 – 2021-04-07 (×3): 650 mg via ORAL
  Filled 2021-04-05 (×3): qty 2

## 2021-04-05 MED ORDER — ONDANSETRON HCL 4 MG/2ML IJ SOLN: 4.0000 mg | Freq: Four times a day (QID) | INTRAMUSCULAR | Status: DC | PRN

## 2021-04-05 MED ORDER — LIDOCAINE HCL 1 % IJ SOLN
INTRAMUSCULAR | Status: AC
  Filled 2021-04-05: qty 60

## 2021-04-05 MED ORDER — CEFAZOLIN SODIUM-DEXTROSE 2-4 GM/100ML-% IV SOLN
INTRAVENOUS | Status: AC
  Filled 2021-04-05: qty 100

## 2021-04-05 MED ORDER — SODIUM CHLORIDE 0.9 % IV SOLN
INTRAVENOUS | Status: DC
Start: 2021-04-05 — End: 2021-04-05

## 2021-04-05 MED ORDER — SODIUM CHLORIDE 0.9 % IV SOLN
80.0000 mg | INTRAVENOUS | Status: AC
  Administered 2021-04-05: 80 mg

## 2021-04-05 MED ORDER — SODIUM CHLORIDE 0.9 % IV SOLN: 80.0000 mg | INTRAVENOUS | Status: DC

## 2021-04-05 MED ORDER — HEPARIN (PORCINE) IN NACL 1000-0.9 UT/500ML-% IV SOLN
INTRAVENOUS | Status: AC
  Filled 2021-04-05: qty 500

## 2021-04-05 MED ORDER — SODIUM CHLORIDE 0.9 % IV SOLN: INTRAVENOUS | Status: DC

## 2021-04-05 MED ORDER — CHLORHEXIDINE GLUCONATE 4 % EX LIQD
60.0000 mL | Freq: Once | CUTANEOUS | Status: AC
  Administered 2021-04-05: 4 via TOPICAL
  Filled 2021-04-05: qty 60

## 2021-04-05 SURGICAL SUPPLY — 23 items
BALLN ATTAIN 80 (BALLOONS) ×2
BALLOON ATTAIN 80 (BALLOONS) ×1 IMPLANT
CABLE SURGICAL S-101-97-12 (CABLE) ×2 IMPLANT
CATH ATTAIN COM SURV 6250V-EH (CATHETERS) ×2 IMPLANT
CATH INFINITI 5FR MPB2 (CATHETERS) ×2 IMPLANT
DEVICE CRTP PERCEPTA QUAD MRI (Pacemaker) ×2 IMPLANT
GUIDEWIRE ANGLED .035X150CM (WIRE) ×2 IMPLANT
GUIDEWIRE SAF TJ AMPL .035X180 (WIRE) ×2 IMPLANT
KIT MICROPUNCTURE NIT STIFF (SHEATH) ×2 IMPLANT
KIT PV (KITS) ×2 IMPLANT
LEAD ATTAIN PERFORMA S 4598-88 (Lead) ×2 IMPLANT
PAD PRO RADIOLUCENT 2001M-C (PAD) ×2 IMPLANT
POUCH AIGIS-R ANTIBACT ICD (Mesh General) ×2 IMPLANT
SHEATH 9FR PRELUDE SNAP 13 (SHEATH) ×2 IMPLANT
SHEATH PINNACLE 6F 10CM (SHEATH) ×2 IMPLANT
SHEATH PINNACLE 7F 10CM (SHEATH) ×2 IMPLANT
SLITTER 6232ADJ (MISCELLANEOUS) ×2 IMPLANT
SYR MEDRAD MARK 7 150ML (SYRINGE) ×2 IMPLANT
TRANSDUCER W/STOPCOCK (MISCELLANEOUS) ×2 IMPLANT
TRAY PACEMAKER INSERTION (PACKS) ×2 IMPLANT
TRAY PV CATH (CUSTOM PROCEDURE TRAY) ×2 IMPLANT
WIRE ACUITY WHISPER EDS 4648 (WIRE) ×2 IMPLANT
WIRE HI TORQ VERSACORE-J 145CM (WIRE) ×2 IMPLANT

## 2021-04-05 NOTE — Progress Notes (Addendum)
Advanced Heart Failure Rounding Note   Subjective:   PICC line removed for EP procedure.   Remains on DBA at 2.5.  Creatinine trending up 1.5>1.7.>1.8  Denies SOB. No complaints.     Objective:   Weight Range:  Vital Signs:   Temp:  [97.7 F (36.5 C)-98.2 F (36.8 C)] 97.9 F (36.6 C) (06/02 0353) Pulse Rate:  [67-76] 67 (06/02 0353) Resp:  [12-17] 16 (06/02 0353) BP: (89-110)/(53-68) 101/53 (06/02 0353) SpO2:  [100 %] 100 % (06/02 0353) Weight:  [53.7 kg] 53.7 kg (06/02 0309) Last BM Date: 04/04/21  Weight change: Filed Weights   04/03/21 0500 04/04/21 0400 04/05/21 0309  Weight: 53.6 kg 54 kg 53.7 kg    Intake/Output:   Intake/Output Summary (Last 24 hours) at 04/05/2021 0751 Last data filed at 04/04/2021 2201 Gross per 24 hour  Intake 440 ml  Output --  Net 440 ml     General:   No resp difficulty HEENT: normal Neck: supple. JPV 6-7 . Carotids 2+ bilat; no bruits. No lymphadenopathy or thryomegaly appreciated. Cor: PMI nondisplaced. Regular rate & rhythm. No rubs, gallops or murmurs. Lungs: clear Abdomen: soft, nontender, nondistended. No hepatosplenomegaly. No bruits or masses. Good bowel sounds. Extremities: no cyanosis, clubbing, rash, edema Neuro: alert & orientedx3, cranial nerves grossly intact. moves all 4 extremities w/o difficulty. Affect pleasant    Telemetry: AV paced 60s  Labs: Basic Metabolic Panel: Recent Labs  Lab 03/31/21 0429 04/01/21 0412 04/01/21 2043 04/02/21 0440 04/03/21 0500 04/04/21 0500 04/04/21 0807 04/05/21 0343  NA 135 134* 133* 134*  --   --  134* 132*  K 3.6 3.5 4.5 4.3  --   --  3.4* 3.6  CL 103 100 100 102  --   --  99 96*  CO2 _0 --   --  30 26  GLUCOSE 93 98 137* 91  --   --  130* 79  BUN 46* 38* 33* 30*  --   --  34* 36*  CREATININE 1.56* 1.59* 1.60* 1.52*  --   --  1.75* 1.84*  CALCIUM 10.9* 10.7* 11.0* 10.9*  --   --  10.9* 10.7*  MG 2.0 1.9  --  2.0 2.1 2.0  --  2.1  PHOS 2.8  --   --    --   --   --   --   --     Liver Function Tests: Recent Labs  Lab 03/30/21 0738 03/31/21 0429 04/01/21 0412  AST 28  --  20  ALT 28  --  19  ALKPHOS 40  --  35*  BILITOT 1.3*  --  1.1  PROT 6.9  --  6.6  ALBUMIN 3.0* 2.8* 2.8*   No results for input(s): LIPASE, AMYLASE in the last 168 hours. No results for input(s): AMMONIA in the last 168 hours.  CBC: Recent Labs  Lab 04/01/21 0412 04/02/21 0440 04/03/21 0500 04/04/21 0500 04/05/21 0343  WBC 4.4 4.3 4.4 4.3 4.2  HGB 10.3* 9.9* 10.2* 10.2* 10.0*  HCT 32.2* 30.9* 31.3* 31.6* 31.0*  MCV 86.3 86.8 86.2 87.1 86.8  PLT 174 166 166 166 163    Cardiac Enzymes: No results for input(s): CKTOTAL, CKMB, CKMBINDEX, TROPONINI in the last 168 hours.  BNP: BNP (last 3 results) Recent Labs    06/05/20 1311 03/26/21 1523 03/30/21 0738  BNP 1,854.5* >4,500.0* >4,500.0*    ProBNP (last 3 results) Recent Labs    07/20/20  [place]*      Other results:  Imaging: No results found.   Medications:     Scheduled Medications: . vitamin C  1,000 mg Oral Daily  . Chlorhexidine Gluconate Cloth  6 each Topical Daily  . Chlorhexidine Gluconate Cloth  6 each Topical Q0600  . feeding supplement  237 mL Oral BID BM  . gentamicin irrigation  80 mg Irrigation To SSTC  . multivitamin with minerals  1 tablet Oral Daily  . mupirocin ointment  1 application Nasal BID  . polyethylene glycol  17 g Oral Daily  . rosuvastatin  10 mg Oral QPM  . senna-docusate  1 tablet Oral QHS  . sodium chloride flush  10-40 mL Intracatheter Q12H    Infusions: . sodium chloride    . sodium chloride 50 mL/hr at 04/05/21 0549  .  ceFAZolin (ANCEF) IV    . DOBUTamine 2.5 mcg/kg/min (04/04/21 1250)    PRN Medications: acetaminophen, ALPRAZolam, sodium chloride flush   Assessment/Plan:   1. 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 Pt previously declined upgrade to  CRT-P - Echo 07/15/2017: EF 65-70%, RV normal (PPM implanted 07/2017) - Echo 04/04/2020: EF 25-30%, RV normal  - Echo 03/27/2021: EF < 20%, global HK. RV normal. Severe MR  - She has end-stage CM and low output likely from chronic RV pacing -On  dobutamine 2.5 mcg. Marland Kitchen PICC out for EP procedure. Start to wean dobutamine tomorrow.  - Volume status stable.  - BP soft. No room for arni/spiro.  - Renal function trending up. Holding torsemide.   - Patient met with Dr. Quentin Ore and is now scheduled for CRT-P upgrade today pending patency of left subclavian system - Patient and her daughters realize that her cardiomyopathy is quite advanced and may be too advanced to benefit from CRT at this time but it is our only durable option for an attempt at LV recovery.  2. AKI - prior SCr from 10/21 was 1.4 - Creatinine 1.5>1.75>1.8  - Hold torsemide.   3. Mitral Regurgitation  - severe on Echo, likely functional. Hopefully will improve with CRT   4. CHB - s/p PPM   5. Hypokalemia -K 3.6   6. CAD - records indicate mod LCx disease.  - No chest pain.   7. PAF Off eliquis for procedure. On heparin drip.  On heparin drip for procedure.   HFSW -->SNF per PT. I messaged Cortlin Pollo to follow up.    Length of Stay: [place] NP_C  04/05/2021, 7:51 AM  Advanced Heart Failure Team Pager [phone] (M-F; 7a - 4p)  Please contact [facility] for night-coverage after hours (4p -7a ) and weekends on [email or web address]  Patient seen and examined with the above-signed Advanced Practice Provider and/or Housestaff. I personally reviewed laboratory data, imaging studies and relevant notes. I independently examined the patient and formulated the important aspects of the plan. I have edited the note to reflect any of my changes or salient points. I have personally discussed the plan with the patient and/or family.  PICC pulled yesterday. Remains on DBA 2.5. Feels ok. Denies SOB, orthopnea or PND. Creatinine up  slightly.   General:  Elderly thin woman lying flat in bed  No resp difficulty HEENT: normal Neck: supple. no JVD. Carotids 2+ bilat; no bruits. No lymphadenopathy or thryomegaly appreciated. Cor: PMI nondisplaced. Regular rate & rhythm. 2/6 MR Lungs: clear Abdomen: soft, nontender,  nondistended. No hepatosplenomegaly. No bruits or masses. Good bowel sounds. Extremities: no cyanosis, clubbing, rash, edema Neuro: alert & orientedx3, cranial nerves grossly intact. moves all 4 extremities w/o difficulty. Affect pleasant  Overall stable on DBA. PICC out so unable to follow CVP or co-ox. SCr up slightly so agree with holding diuretics as CVP has been running low. For CRT-P upgrade today. Continue DBA via PIV.  Glori Bickers, MD  10:50 AM

## 2021-04-05 NOTE — Progress Notes (Addendum)
Subjective:  Feels well Appetite has improved Plan for venogram and CD lead today  Objective:  Vital Signs in the last 24 hours: Temp:  [97.7 F (36.5 C)-98.2 F (36.8 C)] 97.9 F (36.6 C) (06/02 0353) Pulse Rate:  [67-76] 67 (06/02 0353) Resp:  [12-17] 16 (06/02 0353) BP: (98-110)/(53-68) 101/53 (06/02 0353) SpO2:  [100 %] 100 % (06/02 0353) Weight:  [53.7 kg] 53.7 kg (06/02 0309)  Intake/Output from previous day: 06/01 0701 - 06/02 0700 In: 440 [P.O.:440] Out: -   Physical Exam Vitals and nursing note reviewed.  Constitutional:      General: She is not in acute distress.    Appearance: She is well-developed. She is cachectic.  HENT:     Head: Normocephalic and atraumatic.  Eyes:     Conjunctiva/sclera: Conjunctivae normal.     Pupils: Pupils are equal, round, and reactive to light.  Neck:     Vascular: JVD present.  Cardiovascular:     Rate and Rhythm: Normal rate and regular rhythm.     Pulses: Normal pulses and intact distal pulses.     Heart sounds: No murmur heard.   Pulmonary:     Effort: Pulmonary effort is normal.     Breath sounds: Examination of the right-middle field reveals rales. Examination of the right-lower field reveals rales. Rales present. No wheezing.  Abdominal:     General: Bowel sounds are normal.     Palpations: Abdomen is soft.     Tenderness: There is no rebound.  Musculoskeletal:        General: No tenderness. Normal range of motion.     Right lower leg: No edema.     Left lower leg: No edema.  Lymphadenopathy:     Cervical: No cervical adenopathy.  Skin:    General: Skin is warm and dry.  Neurological:     Mental Status: She is alert and oriented to person, place, and time.     Cranial Nerves: No cranial nerve deficit.      Lab Results: BMP Recent Labs    05/19/20 1114 06/05/20 1311 08/25/20 1601 03/26/21 1523 04/02/21 0440 04/04/21 0807 04/05/21 0343  NA 130* 131* 132*   < > 134* 134* 132*  K 4.7 4.5 4.8   < > 4.3  3.4* 3.6  CL 95* 97 98   < > 102 99 96*  CO2 17* 21 20   < > '25 30 26  '$ GLUCOSE 99 79 109*   < > 91 130* 79  BUN 18 25 37*   < > 30* 34* 36*  CREATININE 1.21* 1.29* 1.36*   < > 1.52* 1.75* 1.84*  CALCIUM 12.1* 11.9* 10.4*   < > 10.9* 10.9* 10.7*  GFRNONAA 42* 39* 37*   < > 34* 29* 27*  GFRAA 49* 45* 42*  --   --   --   --    < > = values in this interval not displayed.    CBC Recent Labs  Lab 04/05/21 0343  WBC 4.2  RBC 3.57*  HGB 10.0*  HCT 31.0*  PLT 163  MCV 86.8  MCH 28.0  MCHC 32.3  RDW 16.3*     BNP (last 3 results) Recent Labs    06/05/20 1311 03/26/21 1523 03/30/21 0738  BNP 1,854.5* >4,500.0* >4,500.0*    Lipid Panel     Component Value Date/Time   CHOL 145 11/01/2019 1126   TRIG 49 11/01/2019 1126   HDL 76 11/01/2019 1126  CHOLHDL 1.9 11/01/2019 1126   LDLCALC 58 11/01/2019 1126     Hepatic Function Panel Recent Labs    03/28/21 0857 03/30/21 0738 03/31/21 0429 04/01/21 0412  PROT 7.0 6.9  --  6.6  ALBUMIN 3.1* 3.0* 2.8* 2.8*  AST 38 28  --  20  ALT 34 28  --  19  ALKPHOS 45 40  --  35*  BILITOT 1.7* 1.3*  --  1.1    CARDIAC STUDIES:  EKG 03/27/2021: AV dual paced rhythm  1 Brief NSVT episode on pacemaker interrogation on 02/14/2021 No recent alert  Echocardiogram 03/27/2021: 1. Left ventricular ejection fraction, by estimation, is <20%. The left  ventricle has severely decreased function. The left ventricle demonstrates  global hypokinesis. There is mild left ventricular hypertrophy. Left  ventricular diastolic parameters are  indeterminate.  2. Right ventricular systolic function is normal. The right ventricular  size is normal.  3. Left atrial size was severely dilated.  4. Right atrial size was severely dilated.  5. The mitral valve is normal in structure. Severe mitral valve  regurgitation. No evidence of mitral stenosis.  6. Tricuspid valve regurgitation is severe.  7. The aortic valve is normal in structure.  There is mild calcification  of the aortic valve. There is mild thickening of the aortic valve. Aortic  valve regurgitation is trivial. Mild to moderate aortic valve  sclerosis/calcification is present, without  any evidence of aortic stenosis.  8. The inferior vena cava is dilated in size with <50% respiratory  variability, suggesting right atrial pressure of 15 mmHg.   Assessment & Recommendations:  80 year old [place] American female with hypertension, dual-chamber pacemaker placement for AV block, moderate, medically managed coronary artery disease (LCx), hypertension, NSVT, HFrEF  Acute on chronic systolic heart failure: Acute decompensation. She has cardiac cachexia, with rales on exam. No other overt edema. LVEF <20%. 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 Continue dobutamine, follow co-ox. Plan for CRT-P on 6/2, subject to subclavian vein patency. Appreciate heart failure and EP input.  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 Appreciate nutrition consult  AKI: Cardiorenal syndrome. Cr 1.5-->1.7-->1.8 Na is down to 132. I think this dilutional hyponatremia suggesting mild fluid overload and cardiorenal syndrome. Will consider diuresis after the contrast venogram to be performed today.  PAF: Continue eliquis 2.5 mg bid Interruption recommendation as per EP. Continue heprain   Nigel Mormon, MD Pager: [phone] Office: [phone]

## 2021-04-05 NOTE — Plan of Care (Signed)
  Problem: Clinical Measurements: Goal: Will remain free from infection Outcome: Progressing Goal: Cardiovascular complication will be avoided Outcome: Progressing   Problem: Safety: Goal: Ability to remain free from injury will improve Outcome: Progressing

## 2021-04-05 NOTE — Progress Notes (Signed)
Electrophysiology Rounding Note  Patient Name: Holly [name] Date of Encounter: 04/05/2021  Electrophysiologist: Vickie Epley, MD   Subjective   The patient is doing well today.  Daughter present in room. No complaints at this time.   Inpatient Medications    Scheduled Meds: . vitamin C  1,000 mg Oral Daily  . Chlorhexidine Gluconate Cloth  6 each Topical Daily  . Chlorhexidine Gluconate Cloth  6 each Topical Q0600  . feeding supplement  237 mL Oral BID BM  . gentamicin irrigation  80 mg Irrigation To SSTC  . multivitamin with minerals  1 tablet Oral Daily  . mupirocin ointment  1 application Nasal BID  . polyethylene glycol  17 g Oral Daily  . rosuvastatin  10 mg Oral QPM  . senna-docusate  1 tablet Oral QHS  . sodium chloride flush  10-40 mL Intracatheter Q12H   Continuous Infusions: . sodium chloride    . sodium chloride 50 mL/hr at 04/05/21 0549  .  ceFAZolin (ANCEF) IV    . DOBUTamine 2.5 mcg/kg/min (04/04/21 1250)   PRN Meds: acetaminophen, ALPRAZolam, sodium chloride flush   Vital Signs    Vitals:   04/04/21 1930 04/05/21 0125 04/05/21 0309 04/05/21 0353  BP: (!) 98/54 104/68  (!) 101/53  Pulse: 72 67  67  Resp: '17 12  16  '$ Temp: 98.2 F (36.8 C) 97.7 F (36.5 C)  97.9 F (36.6 C)  TempSrc: Oral Oral  Oral  SpO2: 100% 100%  100%  Weight:   53.7 kg   Height:        Intake/Output Summary (Last 24 hours) at 04/05/2021 0800 Last data filed at 04/04/2021 2201 Gross per 24 hour  Intake 440 ml  Output --  Net 440 ml   Filed Weights   04/03/21 0500 04/04/21 0400 04/05/21 0309  Weight: 53.6 kg 54 kg 53.7 kg    Physical Exam    GEN- The patient is well appearing, alert and oriented x 3 today.   Head- normocephalic, atraumatic Eyes-  Sclera clear, conjunctiva pink Ears- hearing intact Oropharynx- clear Neck- supple Lungs- Clear to ausculation bilaterally, normal work of breathing Heart- Regular rate and rhythm, no murmurs, rubs or gallops GI-  soft, NT, ND, + BS Extremities- no clubbing or cyanosis. No edema Skin- no rash or lesion Psych- euthymic mood, full affect Neuro- strength and sensation are intact  Labs    CBC Recent Labs    04/04/21 0500 04/05/21 0343  WBC 4.3 4.2  HGB 10.2* 10.0*  HCT 31.6* 31.0*  MCV 87.1 86.8  PLT 166 [id number]   Basic Metabolic Panel Recent Labs    04/04/21 0500 04/04/21 0807 04/05/21 0343  NA  --  134* 132*  K  --  3.4* 3.6  CL  --  99 96*  CO2  --  30 26  GLUCOSE  --  130* 79  BUN  --  34* 36*  CREATININE  --  1.75* 1.84*  CALCIUM  --  10.9* 10.7*  MG 2.0  --  2.1   Liver Function Tests No results for input(s): AST, ALT, ALKPHOS, BILITOT, PROT, ALBUMIN in the last 72 hours. No results for input(s): LIPASE, AMYLASE in the last 72 hours. Cardiac Enzymes No results for input(s): CKTOTAL, CKMB, CKMBINDEX, TROPONINI in the last 72 hours.   Telemetry    RV pacing 60-70s (personally reviewed)  Radiology    No results found.  Patient Profile     Holly [name]  is a 80 y.o. female with a past medical history significant for end stage chronic systolic CHF, CKD III, MR, CHB s/p PPM, and CAD.  she was admitted for acute decompensated systolic CHF and seen for CRT upgrade consideration at the request of Dr. Haroldine Laws    Assessment & Plan    1. Acutely decompensated systolic heart failure Echo 03/27/21 EF <20%, global HK, RV normal, severe MR.  Remains on dobutamine peripherally at this time. Discussed with HF team and plan is not to go home on.  Plan for left venogram today, and if left subclavian patent, CS lead addition.  Family and patient realize her cardiomyopathy is advanced and that we may be unable to place a CS lead. They are also aware she may be too progressed to benefit even if we are able to have it placed.   2. AKI Cr 1.5 -> 1.8. Remains on dobutamine, PICC removed so no coox today.  Follow  She is on for left venogram with possible CRT upgrade this afternoon.    For questions or updates, please contact [place] Please consult [email or web address] for contact info under Cardiology/STEMI.  Signed, Shirley Friar, PA-C  04/05/2021, 8:00 AM

## 2021-04-05 NOTE — Progress Notes (Signed)
[facility] PROGRESS NOTE    Progress Note  [name]  [id number] DOB: November 13, 1940 DOA: 03/26/2021 PCP: Merrilee Seashore, MD     Brief Narrative:   Holly [name] is an 80 y.o. female past medical history significant for heart block status post pacemaker, paroxysmal atrial fibrillation on Eliquis combined systolic and diastolic heart failure who comes in from the PCPs office due to abnormal labs when I saw her PCP due to increased shortness of breath for the last 2 months was found to be in acute combined systolic and diastolic heart failure and acute kidney injury sent to the ED, with CT of the abdomen pelvis showing anasarca BNP greater than 4000 and creatinine of 2.3 cardiology was consulted  Significant studies: CT of the abdomen and pelvis cholelithiasis but no acute cholecystitis, with anasarca of the abdominal wall and mesenteric lymphatics  Antibiotics: None  Microbiology data: Blood culture:  Procedures: None  Assessment/Plan:   Acute on chronic combined systolic and diastolic heart failure: Repeated 2D echo showed an EF of 20%. Saturating greater than 94% on room air. Blood pressure is soft.  Holding other medications. PICC line in place.  She is basically unchanged about -800 cc. Continue strict I's and O's and daily weights. Continue Holding diuretic therapy, continue dobutamine. Palliative care has been consulted to address CODE STATUS as she is full code. For weakness synchronization therapy pending patency of left subclavian system. Appreciate EP's assistance.  Acute kidney injury on chronic kidney disease stage IIIa/cardiorenal syndrome: Last creatinine in October 2021 was 1.4.  ARB was held On inotrope therapy, diuretics have been held, her creatinine continues to rise.  Severe mitral regurgitation: Seen on echo likely functional hopefully CRT will help improve some of this the dysfunction.  Paroxysmal atrial fibrillation: Due to  hypotension beta-blocker has been discontinued. Heparin held for procedure.  Cholelithiasis without cholecystitis: Noted.  Incidental left upper pole kidney cyst: Renal ultrasound showed that it was relatively benign no further work-up as an outpatient.  Essential hypertension: Losartan and metoprolol were held due to acute kidney injury and heart failure decompensation.  Severe protein caloric malnutrition: Ensure 3 times daily once able to take orals.   DVT prophylaxis: IV heparin Family Communication:none Status is: Inpatient  Remains inpatient appropriate because:Hemodynamically unstable   Dispo: The patient is from: SNF              Anticipated d/c is to: SNF              Patient currently is not medically stable to d/c.   Difficult to place patient No        Code Status:     Code Status Orders  (From admission, onward)         Start     Ordered   03/26/21 2244  Full code  Continuous        03/26/21 2243        Code Status History    Date Active Date Inactive Code Status Order ID Comments User Context   11/11/2017 1704 11/11/2017 2339 Full Code [id number]  Nigel Mormon, MD Inpatient   07/15/2017 1218 07/16/2017 1743 Full Code [id number]  Nigel Mormon, MD ED   Advance Care Planning Activity        IV Access:    Peripheral IV   Procedures and diagnostic studies:   No results found.   Medical Consultants:    None.   Subjective:    [place]  has no new complaints.  Objective:    Vitals:   04/04/21 1930 04/05/21 0125 04/05/21 0309 04/05/21 0353  BP: (!) 98/54 104/68  (!) 101/53  Pulse: 72 67  67  Resp: '17 12  16  '$ Temp: 98.2 F (36.8 C) 97.7 F (36.5 C)  97.9 F (36.6 C)  TempSrc: Oral Oral  Oral  SpO2: 100% 100%  100%  Weight:   53.7 kg   Height:       SpO2: 100 %   Intake/Output Summary (Last 24 hours) at 04/05/2021 1010 Last data filed at 04/04/2021 2201 Gross per 24 hour  Intake 240 ml  Output --  Net  240 ml   Filed Weights   04/03/21 0500 04/04/21 0400 04/05/21 0309  Weight: 53.6 kg 54 kg 53.7 kg    Exam: General exam: In no acute distress, cachectic Respiratory system: Good air movement and clear to auscultation. Cardiovascular system: S1 & S2 heard, RRR. No JVD. Gastrointestinal system: Abdomen is nondistended, soft and nontender.  Extremities: No pedal edema. Skin: No rashes, lesions or ulcers Psychiatry: Judgement and insight appear normal. Mood & affect appropriate.   Data Reviewed:    Labs: Basic Metabolic Panel: Recent Labs  Lab 03/31/21 0429 04/01/21 0412 04/01/21 2043 04/02/21 0440 04/03/21 0500 04/04/21 0500 04/04/21 0807 04/05/21 0343  NA 135 134* 133* 134*  --   --  134* 132*  K 3.6 3.5 4.5 4.3  --   --  3.4* 3.6  CL 103 100 100 102  --   --  99 96*  CO2 '26 27 27 25  '$ --   --  30 26  GLUCOSE 93 98 137* 91  --   --  130* 79  BUN 46* 38* 33* 30*  --   --  34* 36*  CREATININE 1.56* 1.59* 1.60* 1.52*  --   --  1.75* 1.84*  CALCIUM 10.9* 10.7* 11.0* 10.9*  --   --  10.9* 10.7*  MG 2.0 1.9  --  2.0 2.1 2.0  --  2.1  PHOS 2.8  --   --   --   --   --   --   --    GFR Estimated Creatinine Clearance: 20.7 mL/min (A) (by C-G formula based on SCr of 1.84 mg/dL (H)). Liver Function Tests: Recent Labs  Lab 03/30/21 0738 03/31/21 0429 04/01/21 0412  AST 28  --  20  ALT 28  --  19  ALKPHOS 40  --  35*  BILITOT 1.3*  --  1.1  PROT 6.9  --  6.6  ALBUMIN 3.0* 2.8* 2.8*   No results for input(s): LIPASE, AMYLASE in the last 168 hours. No results for input(s): AMMONIA in the last 168 hours. Coagulation profile No results for input(s): INR, PROTIME in the last 168 hours. COVID-19 Labs  No results for input(s): DDIMER, FERRITIN, LDH, CRP in the last 72 hours.  Lab Results  Component Value Date   [place] NEGATIVE 03/26/2021    CBC: Recent Labs  Lab 04/01/21 0412 04/02/21 0440 04/03/21 0500 04/04/21 0500 04/05/21 0343  WBC 4.4 4.3 4.4 4.3 4.2   HGB 10.3* 9.9* 10.2* 10.2* 10.0*  HCT 32.2* 30.9* 31.3* 31.6* 31.0*  MCV 86.3 86.8 86.2 87.1 86.8  PLT 174 166 166 166 163   Cardiac Enzymes: No results for input(s): CKTOTAL, CKMB, CKMBINDEX, TROPONINI in the last 168 hours. BNP (last 3 results) Recent Labs    07/20/20 1201  PROBNP 9,907*   CBG:  No results for input(s): GLUCAP in the last 168 hours. D-Dimer: No results for input(s): DDIMER in the last 72 hours. Hgb A1c: No results for input(s): HGBA1C in the last 72 hours. Lipid Profile: No results for input(s): CHOL, HDL, LDLCALC, TRIG, CHOLHDL, LDLDIRECT in the last 72 hours. Thyroid function studies: No results for input(s): TSH, T4TOTAL, T3FREE, THYROIDAB in the last 72 hours.  Invalid input(s): FREET3 Anemia work up: No results for input(s): VITAMINB12, FOLATE, FERRITIN, TIBC, IRON, RETICCTPCT in the last 72 hours. Sepsis Labs: Recent Labs  Lab 03/30/21 0322 03/30/21 0738 03/31/21 0429 04/02/21 0440 04/03/21 0500 04/04/21 0500 04/05/21 0343  WBC  --   --    < > 4.3 4.4 4.3 4.2  LATICACIDVEN 1.2 1.3  --   --   --   --   --    < > = values in this interval not displayed.   Microbiology Recent Results (from the past 240 hour(s))  Resp Panel by RT-PCR (Flu A&B, Covid) Nasopharyngeal Swab     Status: None   Collection Time: 03/26/21 11:43 PM   Specimen: Nasopharyngeal Swab; Nasopharyngeal(NP) swabs in vial transport medium  Result Value Ref Range Status   SARS Coronavirus 2 by RT PCR NEGATIVE NEGATIVE Final    Comment: (NOTE) SARS-CoV-2 target nucleic acids are NOT DETECTED.  The SARS-CoV-2 RNA is generally detectable in upper respiratory specimens during the acute phase of infection. The lowest concentration of SARS-CoV-2 viral copies this assay can detect is 138 copies/mL. A negative result does not preclude SARS-Cov-2 infection and should not be used as the sole basis for treatment or other patient management decisions. A negative result may occur with   improper specimen collection/handling, submission of specimen other than nasopharyngeal swab, presence of viral mutation(s) within the areas targeted by this assay, and inadequate number of viral copies(<138 copies/mL). A negative result must be combined with clinical observations, patient history, and epidemiological information. The expected result is Negative.  Fact Sheet for Patients:  EntrepreneurPulse.com.au  Fact Sheet for Healthcare Providers:  IncredibleEmployment.be  This test is no t yet approved or cleared by the [place] FDA and  has been authorized for detection and/or diagnosis of SARS-CoV-2 by FDA under an Emergency Use Authorization (EUA). This EUA will remain  in effect (meaning this test can be used) for the duration of the COVID-19 declaration under Section 564(b)(1) of the Act, 21 U.S.C.section 360bbb-3(b)(1), unless the authorization is terminated  or revoked sooner.       Influenza A by PCR NEGATIVE NEGATIVE Final   Influenza B by PCR NEGATIVE NEGATIVE Final    Comment: (NOTE) The Xpert Xpress SARS-CoV-2/FLU/RSV plus assay is intended as an aid in the diagnosis of influenza from Nasopharyngeal swab specimens and should not be used as a sole basis for treatment. Nasal washings and aspirates are unacceptable for Xpert Xpress SARS-CoV-2/FLU/RSV testing.  Fact Sheet for Patients: EntrepreneurPulse.com.au  Fact Sheet for Healthcare Providers: IncredibleEmployment.be  This test is not yet approved or cleared by the [place] FDA and has been authorized for detection and/or diagnosis of SARS-CoV-2 by FDA under an Emergency Use Authorization (EUA). This EUA will remain in effect (meaning this test can be used) for the duration of the COVID-19 declaration under Section 564(b)(1) of the Act, 21 U.S.C. section 360bbb-3(b)(1), unless the authorization is terminated  or revoked.  Performed at [facility], [place] [address] [place], [place] [address]   Surgical PCR screen     Status: Abnormal  Collection Time: 04/04/21  3:26 PM   Specimen: Nasal Mucosa; Nasal Swab  Result Value Ref Range Status   MRSA, PCR NEGATIVE NEGATIVE Final   Staphylococcus aureus POSITIVE (A) NEGATIVE Final    Comment: (NOTE) The Xpert SA Assay (FDA approved for NASAL specimens in patients 22 years of age and older), is one component of a comprehensive surveillance program. It is not intended to diagnose infection nor to guide or monitor treatment. Performed at [facility], [place] [address], [place], [place] [address]      Medications:   . vitamin C  1,000 mg Oral Daily  . Chlorhexidine Gluconate Cloth  6 each Topical Daily  . Chlorhexidine Gluconate Cloth  6 each Topical Q0600  . feeding supplement  237 mL Oral BID BM  . gentamicin irrigation  80 mg Irrigation To SSTC  . multivitamin with minerals  1 tablet Oral Daily  . mupirocin ointment  1 application Nasal BID  . polyethylene glycol  17 g Oral Daily  . rosuvastatin  10 mg Oral QPM  . senna-docusate  1 tablet Oral QHS  . sodium chloride flush  10-40 mL Intracatheter Q12H   Continuous Infusions: . sodium chloride    . sodium chloride 50 mL/hr at 04/05/21 0549  .  ceFAZolin (ANCEF) IV    . DOBUTamine 2.5 mcg/kg/min (04/04/21 1250)      LOS: 9 days   Charlynne Cousins  [facility]  04/05/2021, 10:10 AM

## 2021-04-05 NOTE — Progress Notes (Signed)
Physical Therapy Treatment Patient Details Name: Holly [name] MRN: [id number] DOB: 09-03-1941 Today's Date: 04/05/2021    History of Present Illness Pt is an 80 y/o female admitted 5/23 secondary to abnormal labs and AKI. 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 PMH includes a fib, CHF, s/p pacemaker, HTN, and CKD.    PT Comments    Pt received in supine, agreeable to therapy session and with good participation and tolerance for gait, transfer and therapeutic exercise training. Pt given HEP handout to reinforce supine/seated exercises and able to perform with good tolerance. Pt performed short gait tasks within room using RW and needs up to minA for safety/balance and RW management, distance limited due to pt bowel urgency and fatigue. Pt reporting 5/10 modified RPE (fatigue) at end of session. Pt continues to benefit from PT services to progress toward functional mobility goals. Pt recommendation updated to HHPT as pt/family do not want low intensity rehab at a SNF.   Follow Up Recommendations  Home health PT;Supervision for mobility/OOB;Supervision/Assistance - 24 hour (recommend max HH services (HHPT/OT/aide) as pt now refusing SNF)     Equipment Recommendations  Wheelchair (measurements PT);Wheelchair cushion (measurements PT);3in1 (PT);Other (comment) (upright walker with seat)    Recommendations for Other Services       Precautions / Restrictions Precautions Precautions: Fall Restrictions Weight Bearing Restrictions: No    Mobility  Bed Mobility Overal bed mobility: Needs Assistance Bed Mobility: Supine to Sit     Supine to sit: Min assist;HOB elevated;Min guard     General bed mobility comments: min guard for trunk rise and BLE transition to EOB but needs minA for seated anterior scooting of hips to foot flat.    Transfers Overall transfer level: Needs assistance Equipment used: Rolling walker (2 wheeled) Transfers: Sit to/from Stand Sit to Stand: Min assist;Min guard          General transfer comment: initially requiring minA to power up from bed but progressed to min guard from [facility] and chair heights to RW.  Ambulation/Gait Ambulation/Gait assistance: Min guard;Min assist Gait Distance (Feet): 28 Feet (84f to BSC, then 232fin room) Assistive device: Rolling walker (2 wheeled) Gait Pattern/deviations: Step-to pattern;Trunk flexed;Leaning posteriorly;Narrow base of support (downward gaze) Gait velocity: reduced   General Gait Details: pt with slow step-to gait, increased trunk flexion, pt utilizing RW similarly to platform walker, leaning through forearms onto RW. Pt reports this is her baseline but able to push through hands/wrists and extend elbows briefly with cues; pt at times with very narrow BOS (especially with turns) and x3 episodes of scissoring needing min guard to minA for safety when turning   Stairs             Wheelchair Mobility    Modified Rankin (Stroke Patients Only)       Balance Overall balance assessment: Needs assistance Sitting-balance support: No upper extremity supported;Feet supported Sitting balance-Leahy Scale: Good     Standing balance support: Bilateral upper extremity supported Standing balance-Leahy Scale: Poor Standing balance comment: Reliant on BUE support and supervision for static standing, min guard for dynamic tasks                            Cognition Arousal/Alertness: Awake/alert Behavior During Therapy: WFL for tasks assessed/performed Overall Cognitive Status: Within Functional Limits for tasks assessed  General Comments: overall WFL for simple mobility tasks, follows 1-step commands well but sometimes slow to process cues (especially 2-step cues)      Exercises General Exercises - Lower Extremity Ankle Circles/Pumps: AROM;Both;10 reps;Supine Long Arc Quad: AROM;Both;10 reps;Seated Heel Slides: AROM;Both;10 reps;Supine Hip  ABduction/ADduction: AROM;Both;10 reps;Supine Hip Flexion/Marching: Strengthening;Both;10 reps;Seated    General Comments General comments (skin integrity, edema, etc.): HR 63-81 bpm, SpO2 100% on RA.      Pertinent Vitals/Pain Pain Assessment: Faces Faces Pain Scale: Hurts a little bit Pain Location: back when standing up straight (not at rest) Pain Descriptors / Indicators: Aching Pain Intervention(s): Limited activity within patient's tolerance;Monitored during session;Repositioned    Home Living                      Prior Function            PT Goals (current goals can now be found in the care plan section) Acute Rehab PT Goals Patient Stated Goal: to go home PT Goal Formulation: With patient Time For Goal Achievement: 04/12/21 Potential to Achieve Goals: Fair Progress towards PT goals: Progressing toward goals    Frequency    Min 3X/week (maintain 3x until SNF confirmed, also possible progression to D/C home)      PT Plan Current plan remains appropriate       AM-PAC PT "6 Clicks" Mobility   Outcome Measure  Help needed turning from your back to your side while in a flat bed without using bedrails?: A Little Help needed moving from lying on your back to sitting on the side of a flat bed without using bedrails?: A Little Help needed moving to and from a bed to a chair (including a wheelchair)?: A Little Help needed standing up from a chair using your arms (e.g., wheelchair or bedside chair)?: A Little Help needed to walk in hospital room?: A Little Help needed climbing 3-5 steps with a railing? : A Lot 6 Click Score: 17    End of Session Equipment Utilized During Treatment: Gait belt Activity Tolerance: Patient tolerated treatment well Patient left: in chair;with call bell/phone within reach;with family/visitor present (chair pad in place but not activated as pt with x2 family members present and wants to sit up until staff comes to take her for  procedure) Nurse Communication: Mobility status PT Visit Diagnosis: Unsteadiness on feet (R26.81);Muscle weakness (generalized) (M62.81);Difficulty in walking, not elsewhere classified (R26.2)     Time: [id number] PT Time Calculation (min) (ACUTE ONLY): 34 min  Charges:  $Gait Training: 8-22 mins $Therapeutic Exercise: 8-22 mins                     Makayle Krahn P., PTA Acute Rehabilitation Services Pager: [phone] Office: [place] 04/05/2021, 3:03 PM

## 2021-04-05 NOTE — TOC Progression Note (Signed)
Transition of Care (TOC) - Progression Note  Heart Failure   Patient Details  Name: Holly [name] MRN: [id number] Date of Birth: [birth year]  Transition of Care [facility]) CM/SW [place], [place] Phone Number: 04/05/2021, 11:30 AM  Clinical Narrative:    CSW spoke with the patient and her daughter, Holly [name] at bedside regarding possible SNF placement at time of discharge per the recommendation of physical therapy and receiving a consult. The Patient reported that she would prefer to go home with home health instead of going to a SNF and patients daughter was in agreement with her preference for home health or going to CIR in the hospital. CSW explained that there would need to be a recommendation and consult in for CIR before pursing CIR as an option. CSW explained to the patient and her daughter that it would be short term for rehab to build her strength back before returning home. However the patient refused SNF and stated that she prefers home health. Patient reported that she needs a walker and CSW will have the RNCM come talk with her and her daughter about home health. No further questions reported at this time.   TOC will continue to follow for discharge needs.   Expected Discharge Plan: [place] Barriers to Discharge: Continued Medical Work up  Expected Discharge Plan and Services Expected Discharge Plan: [place] In-house Referral: Clinical Social Work     Living arrangements for the past 2 months: Single Family Home                                       Social Determinants of Health (SDOH) Interventions Food Insecurity Interventions: Intervention Not Indicated,Other (Comment) (Patient reports receiving Food Stamps) Financial Strain Interventions: Other (Comment) (Referral to outpatient HV CSW for help with light bill) Housing Interventions: Intervention Not Indicated Transportation Interventions: Intervention Not  Indicated  Readmission Risk Interventions No flowsheet data found.  Anyla Israelson, MSW, [place] Heart Failure Social Worker

## 2021-04-06 ENCOUNTER — Inpatient Hospital Stay (HOSPITAL_COMMUNITY): Payer: Medicare Other

## 2021-04-06 ENCOUNTER — Encounter (HOSPITAL_COMMUNITY): Payer: Self-pay | Admitting: Cardiology

## 2021-04-06 DIAGNOSIS — I5043 Acute on chronic combined systolic (congestive) and diastolic (congestive) heart failure: Secondary | ICD-10-CM

## 2021-04-06 DIAGNOSIS — N179 Acute kidney failure, unspecified: Secondary | ICD-10-CM | POA: Diagnosis not present

## 2021-04-06 LAB — BASIC METABOLIC PANEL
Anion gap: 11 (ref 5–15)
BUN: 36 mg/dL — ABNORMAL HIGH (ref 8–23)
CO2: 25 mmol/L (ref 22–32)
Calcium: 10.8 mg/dL — ABNORMAL HIGH (ref 8.9–10.3)
Chloride: 99 mmol/L (ref 98–111)
Creatinine, Ser: 1.76 mg/dL — ABNORMAL HIGH (ref 0.44–1.00)
GFR, Estimated: 29 mL/min — ABNORMAL LOW (ref >60)
Glucose, Bld: 76 mg/dL (ref 70–99)
Potassium: 4.4 mmol/L (ref 3.5–5.1)
Sodium: 135 mmol/L (ref 135–145)

## 2021-04-06 LAB — CBC
HCT: 30.5 % — ABNORMAL LOW (ref 36.0–46.0)
Hemoglobin: 9.7 g/dL — ABNORMAL LOW (ref 12.0–15.0)
MCH: 27.6 pg (ref 26.0–34.0)
MCHC: 31.8 g/dL (ref 30.0–36.0)
MCV: 86.6 fL (ref 80.0–100.0)
Platelets: 164 10*3/uL (ref 150–400)
RBC: 3.52 MIL/uL — ABNORMAL LOW (ref 3.87–5.11)
RDW: 16.7 % — ABNORMAL HIGH (ref 11.5–15.5)
WBC: 4.3 10*3/uL (ref 4.0–10.5)
nRBC: 0 % (ref 0.0–0.2)

## 2021-04-06 LAB — MAGNESIUM: Magnesium: 2.2 mg/dL (ref 1.7–2.4)

## 2021-04-06 MED ORDER — TORSEMIDE 20 MG PO TABS
40.0000 mg | ORAL_TABLET | Freq: Every day | ORAL | Status: DC
  Administered 2021-04-06 – 2021-04-08 (×3): 40 mg via ORAL
  Filled 2021-04-06 (×3): qty 2

## 2021-04-06 MED ORDER — DOBUTAMINE IN D5W 4-5 MG/ML-% IV SOLN
1.2500 ug/kg/min | INTRAVENOUS | Status: DC
  Administered 2021-04-06: 1.25 ug/kg/min via INTRAVENOUS
  Filled 2021-04-06: qty 250

## 2021-04-06 MED FILL — Cefazolin Sodium-Dextrose IV Solution 2 GM/100ML-4%: INTRAVENOUS | Qty: 100 | Status: AC

## 2021-04-06 MED FILL — Gentamicin Sulfate Inj 40 MG/ML: INTRAMUSCULAR | Qty: 80 | Status: AC

## 2021-04-06 MED FILL — Lidocaine HCl Local Inj 1%: INTRAMUSCULAR | Qty: 60 | Status: AC

## 2021-04-06 NOTE — Progress Notes (Signed)
Physical Therapy Treatment Patient Details Name: Holly [name] MRN: [id number] DOB: 1941-02-11 Today's Date: 04/06/2021    History of Present Illness Pt is an 80 y/o female admitted 5/23 secondary to abnormal labs and AKI. 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 s/p CRT upgrade 04/05/21. PMH includes a fib, CHF, s/p pacemaker, HTN, and CKD.    PT Comments    Patient progressing slowly towards PT goals. Requires slightly more assist today due to inability to use LUE after upgrade of pacemaker. Tolerated transfers and gait training with Min A and use of RW for support. VSS on RA throughout activity. Highly motivated to return home when ready and can use arm more frequently. Doffed sling sitting in chair and supported LUE with pillow as it was irritating neck. Encouraged doing HEP a few times daily over the weekend and getting OOB with nursing. Will continue to follow.    Follow Up Recommendations  Home health PT;Supervision for mobility/OOB;Supervision/Assistance - 24 hour     Equipment Recommendations  Wheelchair (measurements PT);Wheelchair cushion (measurements PT);3in1 (PT);Rolling walker with 5" wheels    Recommendations for Other Services       Precautions / Restrictions Precautions Precautions: Fall;ICD/Pacemaker Required Braces or Orthoses: Sling Restrictions Weight Bearing Restrictions: No Other Position/Activity Restrictions: Sling LUE    Mobility  Bed Mobility Overal bed mobility: Needs Assistance Bed Mobility: Supine to Sit     Supine to sit: Min assist;HOB elevated     General bed mobility comments: Able to bring LEs to EOB but needs assist with trunk due to inability to use LUE to pull up on rail.    Transfers Overall transfer level: Needs assistance Equipment used: Rolling walker (2 wheeled) Transfers: Sit to/from Stand Sit to Stand: Min assist         General transfer comment: MIn A to power to standing with cues for anterior weight shift; more assist  needed due to inability to push through LUE. Transferred to chair post ambulation.  Ambulation/Gait Ambulation/Gait assistance: Min assist Gait Distance (Feet): 24 Feet Assistive device: Rolling walker (2 wheeled) Gait Pattern/deviations: Step-through pattern;Trunk flexed;Narrow base of support Gait velocity: reduced   General Gait Details: Slow, narrow step through gait witha ssist for RW management secondary to difficulty turning and lifting up RW for turns; flexed trunk with downward gaze with forearms leaning on walker handles- baseline.   Stairs             Wheelchair Mobility    Modified Rankin (Stroke Patients Only)       Balance Overall balance assessment: Needs assistance Sitting-balance support: No upper extremity supported;Feet supported Sitting balance-Leahy Scale: Good     Standing balance support: During functional activity Standing balance-Leahy Scale: Poor Standing balance comment: Reliant on BUE support and supervision for static standing, min guard for dynamic tasks                            Cognition Arousal/Alertness: Awake/alert Behavior During Therapy: WFL for tasks assessed/performed Overall Cognitive Status: Within Functional Limits for tasks assessed                                 General Comments: overall WFL for simple mobility tasks, follows 1-step commands well but sometimes slow to process cues (especially 2-step cues)      Exercises      General Comments General comments (skin integrity, edema, etc.):  VSS on RA.      Pertinent Vitals/Pain Pain Assessment: Faces Faces Pain Scale: Hurts a little bit Pain Location: feet Pain Descriptors / Indicators: Sore Pain Intervention(s): Monitored during session    Home Living                      Prior Function            PT Goals (current goals can now be found in the care plan section) Progress towards PT goals: Progressing toward goals     Frequency    Min 3X/week      PT Plan Current plan remains appropriate    Co-evaluation              AM-PAC PT "6 Clicks" Mobility   Outcome Measure  Help needed turning from your back to your side while in a flat bed without using bedrails?: A Little Help needed moving from lying on your back to sitting on the side of a flat bed without using bedrails?: A Little Help needed moving to and from a bed to a chair (including a wheelchair)?: A Little Help needed standing up from a chair using your arms (e.g., wheelchair or bedside chair)?: A Little Help needed to walk in hospital room?: A Little Help needed climbing 3-5 steps with a railing? : A Lot 6 Click Score: 17    End of Session Equipment Utilized During Treatment: Gait belt Activity Tolerance: Patient tolerated treatment well Patient left: in chair;with call bell/phone within reach;with chair alarm set Nurse Communication: Mobility status PT Visit Diagnosis: Unsteadiness on feet (R26.81);Muscle weakness (generalized) (M62.81);Difficulty in walking, not elsewhere classified (R26.2)     Time: [id number] PT Time Calculation (min) (ACUTE ONLY): 23 min  Charges:  $Gait Training: 8-22 mins $Therapeutic Activity: 8-22 mins                     Marisa Severin, PT, DPT Acute Rehabilitation Services Pager [phone] Office [place] 04/06/2021, 12:33 PM

## 2021-04-06 NOTE — Progress Notes (Addendum)
Advanced Heart Failure Rounding Note   Subjective:    Underwent successful upgrade to CRT-P yesterday  Remains on DBA 2.5. Diuretics on hold due to mild AKI.   Feels ok. ICD site sore. No SOB, orthopnea or PND    Objective:   Weight Range:  Vital Signs:   Temp:  [97.8 F (36.6 C)-97.9 F (36.6 C)] 97.9 F (36.6 C) (06/03 0111) Pulse Rate:  [0-146] 64 (06/03 0111) Resp:  [11-41] 20 (06/03 0111) BP: (97-133)/(59-86) 97/59 (06/03 0111) SpO2:  [0 %-100 %] 100 % (06/03 0111) Weight:  [54.2 kg] 54.2 kg (06/03 0138) Last BM Date: 04/05/21  Weight change: Filed Weights   04/04/21 0400 04/05/21 0309 04/06/21 0138  Weight: 54 kg 53.7 kg 54.2 kg    Intake/Output:   Intake/Output Summary (Last 24 hours) at 04/06/2021 0542 Last data filed at 04/06/2021 0137 Gross per 24 hour  Intake 960 ml  Output 350 ml  Net 610 ml    General: Lying in bed  No resp difficulty HEENT: normal Neck: supple. JVP 8 Carotids 2+ bilat; no bruits. No lymphadenopathy or thryomegaly appreciated. Cor: PMI nondisplaced. Regular rate & rhythm. No rubs, gallops or murmurs.ICD site ok  Lungs: clear Abdomen: soft, nontender, nondistended. No hepatosplenomegaly. No bruits or masses. Good bowel sounds. Extremities: no cyanosis, clubbing, rash, edema Neuro: alert & orientedx3, cranial nerves grossly intact. moves all 4 extremities w/o difficulty. Affect pleasant   Telemetry: Paced 70 Personally reviewed   Labs: Basic Metabolic Panel: Recent Labs  Lab 03/31/21 0429 04/01/21 0412 04/01/21 2043 04/02/21 0440 04/03/21 0500 04/04/21 0500 04/04/21 0807 04/05/21 0343 04/06/21 0338  NA 135   < > 133* 134*  --   --  134* 132* 135  K 3.6   < > 4.5 4.3  --   --  3.4* 3.6 4.4  CL 103   < > 100 102  --   --  99 96* 99  CO2 26   < > 27 25  --   --  '30 26 25  '$ GLUCOSE 93   < > 137* 91  --   --  130* 79 76  BUN 46*   < > 33* 30*  --   --  34* 36* 36*  CREATININE 1.56*   < > 1.60* 1.52*  --   --  1.75*  1.84* 1.76*  CALCIUM 10.9*   < > 11.0* 10.9*  --   --  10.9* 10.7* 10.8*  MG 2.0   < >  --  2.0 2.1 2.0  --  2.1 2.2  PHOS 2.8  --   --   --   --   --   --   --   --    < > = values in this interval not displayed.    Liver Function Tests: Recent Labs  Lab 03/30/21 0738 03/31/21 0429 04/01/21 0412  AST 28  --  20  ALT 28  --  19  ALKPHOS 40  --  35*  BILITOT 1.3*  --  1.1  PROT 6.9  --  6.6  ALBUMIN 3.0* 2.8* 2.8*   No results for input(s): LIPASE, AMYLASE in the last 168 hours. No results for input(s): AMMONIA in the last 168 hours.  CBC: Recent Labs  Lab 04/02/21 0440 04/03/21 0500 04/04/21 0500 04/05/21 0343 04/06/21 0338  WBC 4.3 4.4 4.3 4.2 4.3  HGB 9.9* 10.2* 10.2* 10.0* 9.7*  HCT 30.9* 31.3* 31.6* 31.0* 30.5*  MCV  86.8 86.2 87.1 86.8 86.6  PLT 166 166 166 163 164    Cardiac Enzymes: No results for input(s): CKTOTAL, CKMB, CKMBINDEX, TROPONINI in the last 168 hours.  BNP: BNP (last 3 results) Recent Labs    06/05/20 1311 03/26/21 1523 03/30/21 0738  BNP 1,854.5* >4,500.0* >4,500.0*    ProBNP (last 3 results) Recent Labs    07/20/20 1201  PROBNP 9,907*      Other results:  Imaging: No results found.   Medications:     Scheduled Medications: . vitamin C  1,000 mg Oral Daily  . Chlorhexidine Gluconate Cloth  6 each Topical Daily  . Chlorhexidine Gluconate Cloth  6 each Topical Q0600  . feeding supplement  237 mL Oral BID BM  . multivitamin with minerals  1 tablet Oral Daily  . mupirocin ointment  1 application Nasal BID  . polyethylene glycol  17 g Oral Daily  . rosuvastatin  10 mg Oral QPM  . senna-docusate  1 tablet Oral QHS  . sodium chloride flush  10-40 mL Intracatheter Q12H    Infusions: . DOBUTamine 2.5 mcg/kg/min (04/04/21 1250)    PRN Medications: acetaminophen, ALPRAZolam, ondansetron (ZOFRAN) IV, sodium chloride flush   Assessment/Plan:   1. 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 Pt previously declined upgrade to CRT-P - Echo 07/15/2017: EF 65-70%, RV normal (PPM implanted 07/2017) - Echo 04/04/2020: EF 25-30%, RV normal  - Echo 03/27/2021: EF < 20%, global HK. RV normal. 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 Marland Kitchen PICC out for EP procedure.  - s/p CRT-P upgrade 6/2 - Volume status slightly up. Resume torsemide 40 daily  - Wean DBA slowly go to 1.25 today. (PICC out due to device upgrade) - Follow for LV recovery. Suspect make to 2-3 months   2. AKI - prior SCr from 10/21 was 1.4 - Creatinine 1.5>1.75>1.8 > 1.7 - Resume torsemide. Follow creatinine. Can decrease to 20 dail as needed  3. Mitral Regurgitation  - severe on Echo, likely functional. Hopefully will improve with CRT   4. CHB - s/p PPM - CRT-P upgrade 04/05/21  5. Hypokalemia -K 4.4  6. CAD - records indicate mod LCx disease.  - No chest pain.   7. PAF Off eliquis for procedure. Resume per EP (likely in 5 days or so)   HFSW -->SNF per PT. I messaged Cortlin Pollo to follow up.    Length of Stay: 10  Glori Bickers MD 04/06/2021, 5:42 AM  Advanced Heart Failure Team Pager [phone] (M-F; 7a - 4p)  Please contact [facility] for night-coverage after hours (4p -7a ) and weekends on [email or web address]

## 2021-04-06 NOTE — Progress Notes (Signed)
Electrophysiology Rounding Note  Patient Name: NASIYAH WELSHANS Date of Encounter: 04/06/2021  Primary Cardiologist: None Electrophysiologist: Vickie Epley, MD   Subjective   Feeling ok this am. Slightly sore. Denies SOB.   Inpatient Medications    Scheduled Meds: . vitamin C  1,000 mg Oral Daily  . Chlorhexidine Gluconate Cloth  6 each Topical Daily  . Chlorhexidine Gluconate Cloth  6 each Topical Q0600  . feeding supplement  237 mL Oral BID BM  . multivitamin with minerals  1 tablet Oral Daily  . mupirocin ointment  1 application Nasal BID  . polyethylene glycol  17 g Oral Daily  . rosuvastatin  10 mg Oral QPM  . senna-docusate  1 tablet Oral QHS  . sodium chloride flush  10-40 mL Intracatheter Q12H  . torsemide  40 mg Oral Daily   Continuous Infusions: . DOBUTamine 1.25 mcg/kg/min (04/06/21 0712)   PRN Meds: acetaminophen, ALPRAZolam, ondansetron (ZOFRAN) IV, sodium chloride flush   Vital Signs    Vitals:   04/05/21 1800 04/05/21 2022 04/06/21 0111 04/06/21 0138  BP: (!) 104/59 106/63 (!) 97/59   Pulse:  64 64   Resp: (!) '21 19 20   '$ Temp:  97.8 F (36.6 C) 97.9 F (36.6 C)   TempSrc:  Oral Oral   SpO2:  100% 100%   Weight:    54.2 kg  Height:        Intake/Output Summary (Last 24 hours) at 04/06/2021 0729 Last data filed at 04/06/2021 0137 Gross per 24 hour  Intake 960 ml  Output 350 ml  Net 610 ml   Filed Weights   04/04/21 0400 04/05/21 0309 04/06/21 0138  Weight: 54 kg 53.7 kg 54.2 kg    Physical Exam    GEN- The patient is well appearing, alert and oriented x 3 today.   Head- normocephalic, atraumatic Eyes-  Sclera clear, conjunctiva pink Ears- hearing intact Oropharynx- clear Neck- supple Lungs- Clear to ausculation bilaterally, normal work of breathing Heart- Regular rate and rhythm, no murmurs, rubs or gallops GI- soft, NT, ND, + BS Extremities- no clubbing or cyanosis. No edema Skin- no rash or lesion Psych- euthymic mood, full  affect Neuro- strength and sensation are intact  Labs    CBC Recent Labs    04/05/21 0343 04/06/21 0338  WBC 4.2 4.3  HGB 10.0* 9.7*  HCT 31.0* 30.5*  MCV 86.8 86.6  PLT 163 [id number]   Basic Metabolic Panel Recent Labs    04/05/21 0343 04/06/21 0338  NA 132* 135  K 3.6 4.4  CL 96* 99  CO2 26 25  GLUCOSE 79 76  BUN 36* 36*  CREATININE 1.84* 1.76*  CALCIUM 10.7* 10.8*  MG 2.1 2.2   Liver Function Tests No results for input(s): AST, ALT, ALKPHOS, BILITOT, PROT, ALBUMIN in the last 72 hours. No results for input(s): LIPASE, AMYLASE in the last 72 hours. Cardiac Enzymes No results for input(s): CKTOTAL, CKMB, CKMBINDEX, TROPONINI in the last 72 hours.   Telemetry    BiV pacing in 60-70s (personally reviewed)  Radiology    No results found.  Patient Profile     CLYDINE MCHATTON is a 80 y.o. female with a past medical history significant for end stage chronic systolic CHF, CKD III, MR, CHB s/p PPM, and CAD.  she was admitted for acute decompensated systolic CHF and seen for CRT upgrade consideration at the request of Dr. Haroldine Laws    Assessment & Plan    1.  Acutely decompensated systolic heart failure S/p CRT upgrade 04/05/2021 by Dr. Quentin Ore. Device interrogation OK this am. CXR stable Echo 03/27/21 EF <20%, global HK, RV normal, severe MR.  Remains on dobutamine peripherally at this time. Plan slow wean per HF team.  Family and patient aware she may be too progressed to benefit from CRT in the long run  2. AKI Cr 1.5 -> 1.8 -> 1.76 Diuretics per CHF team.  Weaning dobutamine slowly per CHF team.    Usual follow up arrange s/p CRT upgrade.  EP will see as needed while remains here.   She is on for left venogram with possible CRT upgrade this afternoon.  For questions or updates, please contact [place] Please consult [email or web address] for contact info under Cardiology/STEMI.  Signed, Shirley Friar, PA-C  04/06/2021, 7:29 AM

## 2021-04-06 NOTE — Progress Notes (Signed)
[facility] PROGRESS NOTE    Progress Note  Holly [name]  [id number] DOB: 01-Sep-1941 DOA: 03/26/2021 PCP: Merrilee Seashore, MD     Brief Narrative:   Holly [name] is an 80 y.o. female past medical history significant for heart block status post pacemaker, paroxysmal atrial fibrillation on Eliquis combined systolic and diastolic heart failure who comes in from the PCPs office due to abnormal labs when I saw her PCP due to increased shortness of breath for the last 2 months was found to be in acute combined systolic and diastolic heart failure and acute kidney injury sent to the ED, with CT of the abdomen pelvis showing anasarca BNP greater than 4000 and creatinine of 2.3 cardiology was consulted  Significant studies: CT of the abdomen and pelvis cholelithiasis but no acute cholecystitis, with anasarca of the abdominal wall and mesenteric lymphatics  Antibiotics: None  Microbiology data: Blood culture:  Procedures: None  Assessment/Plan:   Acute on chronic combined systolic and diastolic heart failure: Repeated 2D echo showed an EF of 20%. Saturating 100% on room air. I's and O continues to be unchanged from previous. She is post CRT-P upgrade on 04/05/2021.  PICC line discontinued Continue strict I's and O's and daily weights. Continue Holding diuretic therapy, continue dobutamine low-dose. Further management per advanced heart failure team. Physical therapy evaluated the patient recommended home health PT as patient is refusing skilled nursing facility.  Acute kidney injury on chronic kidney disease stage IIIa/cardiorenal syndrome: Last creatinine in October 2021 was 1.4.  ARB was held Creatinine has stabilized, on inotropic therapy continue to hold diuretic therapy.  Severe mitral regurgitation: Seen on echo likely functional hopefully CRT will help improve some of this the dysfunction.  Paroxysmal atrial fibrillation: Due to hypotension beta-blocker has  been discontinued. Anticoagulation held for procedure, EP to dictate when to restart DOAC.  Hypokalemia:  Repleted now stable continue to monitor regularly.  Cholelithiasis without cholecystitis: Noted.  Incidental left upper pole kidney cyst: Renal ultrasound showed that it was relatively benign no further work-up as an outpatient.  Essential hypertension: Losartan and metoprolol were held due to acute kidney injury and heart failure decompensation.  Severe protein caloric malnutrition: Ensure 3 times daily once able to take orals.   DVT prophylaxis: IV heparin Family Communication:none Status is: Inpatient  Remains inpatient appropriate because:Hemodynamically unstable   Dispo: The patient is from: SNF              Anticipated d/c is to: SNF              Patient currently is not medically stable to d/c.   Difficult to place patient No        Code Status:     Code Status Orders  (From admission, onward)         Start     Ordered   03/26/21 2244  Full code  Continuous        03/26/21 2243        Code Status History    Date Active Date Inactive Code Status Order ID Comments User Context   11/11/2017 1704 11/11/2017 2339 Full Code [id number]  Nigel Mormon, MD Inpatient   07/15/2017 1218 07/16/2017 1743 Full Code [id number]  Nigel Mormon, MD ED   Advance Care Planning Activity        IV Access:    Peripheral IV   Procedures and diagnostic studies:   DG Chest 2 View  Result Date: 04/06/2021 CLINICAL  DATA:  Pacemaker placement EXAM: CHEST - 2 VIEW COMPARISON:  03/30/2021 FINDINGS: Interval replacement of pacemaker pack. Third lead added in the coronary sinus. Right atrium right ventricle leads unchanged. Cardiac enlargement without heart failure. Minimal pleural fluid bilaterally. Mild bibasilar atelectasis. No pneumothorax. IMPRESSION: Mild bibasilar atelectasis and minimal pleural fluid. Satisfactory pacemaker position. Electronically Signed    By: Franchot Gallo M.D.   On: 04/06/2021 08:17     Medical Consultants:    None.   Subjective:    Holly [name] shoulder sore but otherwise she relates she feels about the same as yesterday.  Objective:    Vitals:   04/05/21 2022 04/06/21 0111 04/06/21 0138 04/06/21 0855  BP: 106/63 (!) 97/59  98/65  Pulse: 64 64  63  Resp: '19 20  17  '$ Temp: 97.8 F (36.6 C) 97.9 F (36.6 C)  97.9 F (36.6 C)  TempSrc: Oral Oral  Oral  SpO2: 100% 100%  100%  Weight:   54.2 kg   Height:       SpO2: 100 % O2 Flow Rate (L/min): 2 L/min   Intake/Output Summary (Last 24 hours) at 04/06/2021 0952 Last data filed at 04/06/2021 0933 Gross per 24 hour  Intake 960 ml  Output 350 ml  Net 610 ml   Filed Weights   04/04/21 0400 04/05/21 0309 04/06/21 0138  Weight: 54 kg 53.7 kg 54.2 kg    Exam: General exam: In no acute distress. Respiratory system: Good air movement and clear to auscultation. Cardiovascular system: S1 & S2 heard, RRR. No JVD. Gastrointestinal system: Abdomen is nondistended, soft and nontender.  Extremities: No pedal edema. Skin: No rashes, lesions or ulcers Psychiatry: Judgement and insight appear normal. Mood & affect appropriate.   Data Reviewed:    Labs: Basic Metabolic Panel: Recent Labs  Lab 03/31/21 0429 04/01/21 0412 04/01/21 2043 04/02/21 0440 04/03/21 0500 04/04/21 0500 04/04/21 0807 04/05/21 0343 04/06/21 0338  NA 135   < > 133* 134*  --   --  134* 132* 135  K 3.6   < > 4.5 4.3  --   --  3.4* 3.6 4.4  CL 103   < > 100 102  --   --  99 96* 99  CO2 26   < > 27 25  --   --  '30 26 25  '$ GLUCOSE 93   < > 137* 91  --   --  130* 79 76  BUN 46*   < > 33* 30*  --   --  34* 36* 36*  CREATININE 1.56*   < > 1.60* 1.52*  --   --  1.75* 1.84* 1.76*  CALCIUM 10.9*   < > 11.0* 10.9*  --   --  10.9* 10.7* 10.8*  MG 2.0   < >  --  2.0 2.1 2.0  --  2.1 2.2  PHOS 2.8  --   --   --   --   --   --   --   --    < > = values in this interval not displayed.    GFR Estimated Creatinine Clearance: 21.8 mL/min (A) (by C-G formula based on SCr of 1.76 mg/dL (H)). Liver Function Tests: Recent Labs  Lab 03/31/21 0429 04/01/21 0412  AST  --  20  ALT  --  19  ALKPHOS  --  35*  BILITOT  --  1.1  PROT  --  6.6  ALBUMIN 2.8* 2.8*   No results  for input(s): LIPASE, AMYLASE in the last 168 hours. No results for input(s): AMMONIA in the last 168 hours. Coagulation profile No results for input(s): INR, PROTIME in the last 168 hours. COVID-19 Labs  No results for input(s): DDIMER, FERRITIN, LDH, CRP in the last 72 hours.  Lab Results  Component Value Date   [place] NEGATIVE 03/26/2021    CBC: Recent Labs  Lab 04/02/21 0440 04/03/21 0500 04/04/21 0500 04/05/21 0343 04/06/21 0338  WBC 4.3 4.4 4.3 4.2 4.3  HGB 9.9* 10.2* 10.2* 10.0* 9.7*  HCT 30.9* 31.3* 31.6* 31.0* 30.5*  MCV 86.8 86.2 87.1 86.8 86.6  PLT 166 166 166 163 164   Cardiac Enzymes: No results for input(s): CKTOTAL, CKMB, CKMBINDEX, TROPONINI in the last 168 hours. BNP (last 3 results) Recent Labs    07/20/20 1201  PROBNP 9,907*   CBG: No results for input(s): GLUCAP in the last 168 hours. D-Dimer: No results for input(s): DDIMER in the last 72 hours. Hgb A1c: No results for input(s): HGBA1C in the last 72 hours. Lipid Profile: No results for input(s): CHOL, HDL, LDLCALC, TRIG, CHOLHDL, LDLDIRECT in the last 72 hours. Thyroid function studies: No results for input(s): TSH, T4TOTAL, T3FREE, THYROIDAB in the last 72 hours.  Invalid input(s): FREET3 Anemia work up: No results for input(s): VITAMINB12, FOLATE, FERRITIN, TIBC, IRON, RETICCTPCT in the last 72 hours. Sepsis Labs: Recent Labs  Lab 04/03/21 0500 04/04/21 0500 04/05/21 0343 04/06/21 0338  WBC 4.4 4.3 4.2 4.3   Microbiology Recent Results (from the past 240 hour(s))  Surgical PCR screen     Status: Abnormal   Collection Time: 04/04/21  3:26 PM   Specimen: Nasal Mucosa; Nasal Swab  Result  Value Ref Range Status   MRSA, PCR NEGATIVE NEGATIVE Final   Staphylococcus aureus POSITIVE (A) NEGATIVE Final    Comment: (NOTE) The Xpert SA Assay (FDA approved for NASAL specimens in patients 9 years of age and older), is one component of a comprehensive surveillance program. It is not intended to diagnose infection nor to guide or monitor treatment. Performed at [facility], [place] [address], [place], [place] [address]      Medications:   . vitamin C  1,000 mg Oral Daily  . Chlorhexidine Gluconate Cloth  6 each Topical Daily  . Chlorhexidine Gluconate Cloth  6 each Topical Q0600  . feeding supplement  237 mL Oral BID BM  . multivitamin with minerals  1 tablet Oral Daily  . mupirocin ointment  1 application Nasal BID  . polyethylene glycol  17 g Oral Daily  . rosuvastatin  10 mg Oral QPM  . senna-docusate  1 tablet Oral QHS  . sodium chloride flush  10-40 mL Intracatheter Q12H  . torsemide  40 mg Oral Daily   Continuous Infusions: . DOBUTamine 1.25 mcg/kg/min (04/06/21 0712)      LOS: 10 days   Charlynne Cousins  [facility]  04/06/2021, 9:52 AM

## 2021-04-06 NOTE — Progress Notes (Signed)
Subjective:  Feels well Appetite has improved Plan for venogram and CD lead today  Objective:  Vital Signs in the last 24 hours: Temp:  [97.8 F (36.6 C)-97.9 F (36.6 C)] 97.9 F (36.6 C) (06/03 0855) Pulse Rate:  [0-146] 63 (06/03 0855) Resp:  [11-41] 17 (06/03 0855) BP: (97-133)/(59-86) 98/65 (06/03 0855) SpO2:  [0 %-100 %] 100 % (06/03 0855) Weight:  [54.2 kg] 54.2 kg (06/03 0138)  Intake/Output from previous day: 06/02 0701 - 06/03 0700 In: 34 [P.O.:960] Out: 350 [Urine:350]  Physical Exam Vitals and nursing note reviewed.  Constitutional:      General: She is not in acute distress.    Appearance: She is well-developed. She is cachectic.  HENT:     Head: Normocephalic and atraumatic.  Eyes:     Conjunctiva/sclera: Conjunctivae normal.     Pupils: Pupils are equal, round, and reactive to light.  Neck:     Vascular: JVD present.  Cardiovascular:     Rate and Rhythm: Normal rate and regular rhythm.     Pulses: Normal pulses and intact distal pulses.     Heart sounds: No murmur heard.   Pulmonary:     Effort: Pulmonary effort is normal.     Breath sounds: Examination of the right-middle field reveals rales. Examination of the right-lower field reveals rales. Rales present. No wheezing.  Abdominal:     General: Bowel sounds are normal.     Palpations: Abdomen is soft.     Tenderness: There is no rebound.  Musculoskeletal:        General: No tenderness. Normal range of motion.     Right lower leg: No edema.     Left lower leg: No edema.  Lymphadenopathy:     Cervical: No cervical adenopathy.  Skin:    General: Skin is warm and dry.  Neurological:     Mental Status: She is alert and oriented to person, place, and time.     Cranial Nerves: No cranial nerve deficit.      Lab Results: BMP Recent Labs    05/19/20 1114 06/05/20 1311 08/25/20 1601 03/26/21 1523 04/04/21 0807 04/05/21 0343 04/06/21 0338  NA 130* 131* 132*   < > 134* 132* 135  K 4.7  4.5 4.8   < > 3.4* 3.6 4.4  CL 95* 97 98   < > 99 96* 99  CO2 17* 21 20   < > '30 26 25  '$ GLUCOSE 99 79 109*   < > 130* 79 76  BUN 18 25 37*   < > 34* 36* 36*  CREATININE 1.21* 1.29* 1.36*   < > 1.75* 1.84* 1.76*  CALCIUM 12.1* 11.9* 10.4*   < > 10.9* 10.7* 10.8*  GFRNONAA 42* 39* 37*   < > 29* 27* 29*  GFRAA 49* 45* 42*  --   --   --   --    < > = values in this interval not displayed.    CBC Recent Labs  Lab 04/06/21 0338  WBC 4.3  RBC 3.52*  HGB 9.7*  HCT 30.5*  PLT 164  MCV 86.6  MCH 27.6  MCHC 31.8  RDW 16.7*     BNP (last 3 results) Recent Labs    06/05/20 1311 03/26/21 1523 03/30/21 0738  BNP 1,854.5* >4,500.0* >4,500.0*    Lipid Panel     Component Value Date/Time   CHOL 145 11/01/2019 1126   TRIG 49 11/01/2019 1126   HDL 76 11/01/2019 1126  CHOLHDL 1.9 11/01/2019 1126   LDLCALC 58 11/01/2019 1126     Hepatic Function Panel Recent Labs    03/28/21 0857 03/30/21 0738 03/31/21 0429 04/01/21 0412  PROT 7.0 6.9  --  6.6  ALBUMIN 3.1* 3.0* 2.8* 2.8*  AST 38 28  --  20  ALT 34 28  --  19  ALKPHOS 45 40  --  35*  BILITOT 1.7* 1.3*  --  1.1    CARDIAC STUDIES:  EKG 03/27/2021: AV dual paced rhythm  1 Brief NSVT episode on pacemaker interrogation on 02/14/2021 No recent alert  Echocardiogram 03/27/2021: 1. Left ventricular ejection fraction, by estimation, is <20%. The left  ventricle has severely decreased function. The left ventricle demonstrates  global hypokinesis. There is mild left ventricular hypertrophy. Left  ventricular diastolic parameters are  indeterminate.  2. Right ventricular systolic function is normal. The right ventricular  size is normal.  3. Left atrial size was severely dilated.  4. Right atrial size was severely dilated.  5. The mitral valve is normal in structure. Severe mitral valve  regurgitation. No evidence of mitral stenosis.  6. Tricuspid valve regurgitation is severe.  7. The aortic valve is normal  in structure. There is mild calcification  of the aortic valve. There is mild thickening of the aortic valve. Aortic  valve regurgitation is trivial. Mild to moderate aortic valve  sclerosis/calcification is present, without  any evidence of aortic stenosis.  8. The inferior vena cava is dilated in size with <50% respiratory  variability, suggesting right atrial pressure of 15 mmHg.   Assessment & Recommendations:  80 year old [place] American female with hypertension, dual-chamber pacemaker placement for AV block, moderate, medically managed coronary artery disease (LCx), hypertension, NSVT, HFrEF  Acute on chronic systolic heart failure: Acute decompensation. She has cardiac cachexia, with rales on exam. No other overt edema. LVEF <20%. 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 Currently on dobutamine, follow co-ox. Now s/p CRT-P on 6/2 Appreciate heart failure and EP input. Agree with resuming torsemide 40 mg daily. Hopefull we can wean off dobutamine as we plan for discharge.  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 Appreciate nutrition consult  AKI: Cardiorenal syndrome. Cr 1.5-->1.7-->1.8-->1.7 Resume torsemide 40 mg daily.  PAF: Resume eliquis 2.5 mg bid when ok per EP  Discharge planning: PT recommending Home health PT;Supervision for mobility/OOB;Supervision/Assistance - 24 hour   Nigel Mormon, MD Pager: [phone] Office: [phone]

## 2021-04-06 NOTE — Care Management Important Message (Signed)
Important Message  Patient Details  Name: YAZLYNN SWIERK MRN: [id number] Date of Birth: [birth year]   Medicare Important Message Given:  Yes     Shelda Altes 04/06/2021, 9:56 AM

## 2021-04-07 LAB — BASIC METABOLIC PANEL
Anion gap: 10 (ref 5–15)
BUN: 32 mg/dL — ABNORMAL HIGH (ref 8–23)
CO2: 23 mmol/L (ref 22–32)
Calcium: 11 mg/dL — ABNORMAL HIGH (ref 8.9–10.3)
Chloride: 100 mmol/L (ref 98–111)
Creatinine, Ser: 1.79 mg/dL — ABNORMAL HIGH (ref 0.44–1.00)
GFR, Estimated: 28 mL/min — ABNORMAL LOW (ref >60)
Glucose, Bld: 87 mg/dL (ref 70–99)
Potassium: 3.9 mmol/L (ref 3.5–5.1)
Sodium: 133 mmol/L — ABNORMAL LOW (ref 135–145)

## 2021-04-07 LAB — CBC
HCT: 29.1 % — ABNORMAL LOW (ref 36.0–46.0)
Hemoglobin: 9.5 g/dL — ABNORMAL LOW (ref 12.0–15.0)
MCH: 28.1 pg (ref 26.0–34.0)
MCHC: 32.6 g/dL (ref 30.0–36.0)
MCV: 86.1 fL (ref 80.0–100.0)
Platelets: 160 10*3/uL (ref 150–400)
RBC: 3.38 MIL/uL — ABNORMAL LOW (ref 3.87–5.11)
RDW: 16.5 % — ABNORMAL HIGH (ref 11.5–15.5)
WBC: 3.5 10*3/uL — ABNORMAL LOW (ref 4.0–10.5)
nRBC: 0 % (ref 0.0–0.2)

## 2021-04-07 LAB — MAGNESIUM: Magnesium: 2.1 mg/dL (ref 1.7–2.4)

## 2021-04-07 MED ORDER — HYDROXYZINE HCL 10 MG/5ML PO SYRP
10.0000 mg | ORAL_SOLUTION | Freq: Three times a day (TID) | ORAL | Status: DC | PRN
  Administered 2021-04-07: 10 mg via ORAL
  Filled 2021-04-07 (×3): qty 5

## 2021-04-07 NOTE — Progress Notes (Signed)
Subjective:  Feels well Does not want to go to SNF, wants to go home.   S/p CS lead 6/2 Dobutamine down to 1.25 mcg/kg/min  Objective:  Vital Signs in the last 24 hours: Temp:  [97.3 F (36.3 C)-98.9 F (37.2 C)] 97.3 F (36.3 C) (06/04 0408) Pulse Rate:  [60-61] 60 (06/04 0408) Resp:  [15-19] 18 (06/04 0408) BP: (97-106)/(59-67) 106/61 (06/04 0408) SpO2:  [93 %-100 %] 93 % (06/04 0408) Weight:  [54.4 kg] 54.4 kg (06/04 0529)  Intake/Output from previous day: 06/03 0701 - 06/04 0700 In: 360 [P.O.:360] Out: 250 [Urine:250]  Physical Exam Vitals and nursing note reviewed.  Constitutional:      General: She is not in acute distress.    Appearance: She is well-developed. She is cachectic.  HENT:     Head: Normocephalic and atraumatic.  Eyes:     Conjunctiva/sclera: Conjunctivae normal.     Pupils: Pupils are equal, round, and reactive to light.  Neck:     Vascular: JVD present.  Cardiovascular:     Rate and Rhythm: Normal rate and regular rhythm.     Pulses: Normal pulses and intact distal pulses.     Heart sounds: No murmur heard.   Pulmonary:     Effort: Pulmonary effort is normal.     Breath sounds: Examination of the right-middle field reveals rales. Examination of the right-lower field reveals rales. Rales (RLL) present. No wheezing.  Abdominal:     General: Bowel sounds are normal.     Palpations: Abdomen is soft.     Tenderness: There is no rebound.  Musculoskeletal:        General: No tenderness. Normal range of motion.     Right lower leg: No edema.     Left lower leg: No edema.  Lymphadenopathy:     Cervical: No cervical adenopathy.  Skin:    General: Skin is warm and dry.  Neurological:     Mental Status: She is alert and oriented to person, place, and time.     Cranial Nerves: No cranial nerve deficit.      Lab Results: BMP Recent Labs    05/19/20 1114 06/05/20 1311 08/25/20 1601 03/26/21 1523 04/05/21 0343 04/06/21 0338 04/07/21 0220   NA 130* 131* 132*   < > 132* 135 133*  K 4.7 4.5 4.8   < > 3.6 4.4 3.9  CL 95* 97 98   < > 96* 99 100  CO2 17* 21 20   < > '26 25 23  '$ GLUCOSE 99 79 109*   < > 79 76 87  BUN 18 25 37*   < > 36* 36* 32*  CREATININE 1.21* 1.29* 1.36*   < > 1.84* 1.76* 1.79*  CALCIUM 12.1* 11.9* 10.4*   < > 10.7* 10.8* 11.0*  GFRNONAA 42* 39* 37*   < > 27* 29* 28*  GFRAA 49* 45* 42*  --   --   --   --    < > = values in this interval not displayed.    CBC Recent Labs  Lab 04/07/21 0220  WBC 3.5*  RBC 3.38*  HGB 9.5*  HCT 29.1*  PLT 160  MCV 86.1  MCH 28.1  MCHC 32.6  RDW 16.5*     BNP (last 3 results) Recent Labs    06/05/20 1311 03/26/21 1523 03/30/21 0738  BNP 1,854.5* >4,500.0* >4,500.0*    Lipid Panel     Component Value Date/Time   CHOL 145 11/01/2019 1126  TRIG 49 11/01/2019 1126   HDL 76 11/01/2019 1126   CHOLHDL 1.9 11/01/2019 1126   LDLCALC 58 11/01/2019 1126     Hepatic Function Panel Recent Labs    03/28/21 0857 03/30/21 0738 03/31/21 0429 04/01/21 0412  PROT 7.0 6.9  --  6.6  ALBUMIN 3.1* 3.0* 2.8* 2.8*  AST 38 28  --  20  ALT 34 28  --  19  ALKPHOS 45 40  --  35*  BILITOT 1.7* 1.3*  --  1.1    CARDIAC STUDIES:  EKG 03/27/2021: AV dual paced rhythm  1 Brief NSVT episode on pacemaker interrogation on 02/14/2021 No recent alert  Echocardiogram 03/27/2021: 1. Left ventricular ejection fraction, by estimation, is <20%. The left  ventricle has severely decreased function. The left ventricle demonstrates  global hypokinesis. There is mild left ventricular hypertrophy. Left  ventricular diastolic parameters are  indeterminate.  2. Right ventricular systolic function is normal. The right ventricular  size is normal.  3. Left atrial size was severely dilated.  4. Right atrial size was severely dilated.  5. The mitral valve is normal in structure. Severe mitral valve  regurgitation. No evidence of mitral stenosis.  6. Tricuspid valve  regurgitation is severe.  7. The aortic valve is normal in structure. There is mild calcification  of the aortic valve. There is mild thickening of the aortic valve. Aortic  valve regurgitation is trivial. Mild to moderate aortic valve  sclerosis/calcification is present, without  any evidence of aortic stenosis.  8. The inferior vena cava is dilated in size with <50% respiratory  variability, suggesting right atrial pressure of 15 mmHg.   Assessment & Recommendations:  80 year old [place] American female with hypertension, dual-chamber pacemaker placement for AV block, moderate, medically managed coronary artery disease (LCx), hypertension, NSVT, HFrEF  Acute on chronic systolic heart failure: Acute decompensation. She has cardiac cachexia, with rales on exam. No other overt edema. LVEF <20%. 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 Currently on dobutamine, follow co-ox. Now s/p CRT-P on 6/2 Appreciate heart failure and EP input. Continue torsemide 40 mg PO daily. Will hope to wean her off dobutamine today.  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 Appreciate nutrition consult  AKI: Cardiorenal syndrome. Cr 1.5-->1.7-->1.8-->1.7 and stabilizing Continue torsemide 40 mg daily.  PAF: Resume eliquis 2.5 mg bid on 6/7.  Discharge planning: PT recommending Home health PT;Supervision for mobility/OOB;Supervision/Assistance - 24 hour    Nigel Mormon, MD Pager: [phone] Office: [phone]

## 2021-04-07 NOTE — Progress Notes (Signed)
[facility] PROGRESS NOTE    Progress Note  Holly [name]  [id number] DOB: Mar 11, 1941 DOA: 03/26/2021 PCP: Merrilee Seashore, MD     Brief Narrative:   Holly [name] is an 80 y.o. female past medical history significant for heart block status post pacemaker, paroxysmal atrial fibrillation on Eliquis combined systolic and diastolic heart failure who comes in from the PCPs office due to abnormal labs when I saw her PCP due to increased shortness of breath for the last 2 months was found to be in acute combined systolic and diastolic heart failure and acute kidney injury sent to the ED, with CT of the abdomen pelvis showing anasarca BNP greater than 4000 and creatinine of 2.3 cardiology was consulted  Significant studies: CT of the abdomen and pelvis cholelithiasis but no acute cholecystitis, with anasarca of the abdominal wall and mesenteric lymphatics  Antibiotics: None  Microbiology data: Blood culture:  Procedures: None  Assessment/Plan:   Acute on chronic combined systolic and diastolic heart failure: Repeated 2D echo showed an EF of 20%. Saturating 100% on room air. I's and O continues to be unchanged from previous. She is post CRT-P upgrade on 04/05/2021.  PICC line discontinued Continue strict I's and O's and daily weights.  Continue to hold diuretics try to wean her off dobutamine today. Appreciate cardiology's assistance.   Further management per advanced heart failure team. Physical therapy evaluated the patient recommended home health PT as patient is refusing skilled nursing facility.  Acute kidney injury on chronic kidney disease stage IIIa/cardiorenal syndrome: Last creatinine in October 2021 was 1.4.  ARB was held Creatinine has stabilized, off inotropes today cardiology to dictate when to start ARB.  Severe mitral regurgitation: Seen on echo likely functional hopefully CRT will help improve some of this the dysfunction.  Paroxysmal atrial  fibrillation: Due to hypotension beta-blocker has been discontinued. Anticoagulation held for procedure, EP to dictate when to restart DOAC.  Hypokalemia:  Repleted now stable continue to monitor regularly.  Cholelithiasis without cholecystitis: Noted.  Incidental left upper pole kidney cyst: Renal ultrasound showed that it was relatively benign no further work-up as an outpatient.  Essential hypertension: Losartan and metoprolol were held due to acute kidney injury and heart failure decompensation. Blood pressure is well controlled.  Severe protein caloric malnutrition: Ensure 3 times daily once able to take orals.   DVT prophylaxis: IV heparin Family Communication:none Status is: Inpatient  Remains inpatient appropriate because:Hemodynamically unstable   Dispo: The patient is from: SNF              Anticipated d/c is to: SNF              Patient currently is not medically stable to d/c.   Difficult to place patient No        Code Status:     Code Status Orders  (From admission, onward)         Start     Ordered   03/26/21 2244  Full code  Continuous        03/26/21 2243        Code Status History    Date Active Date Inactive Code Status Order ID Comments User Context   11/11/2017 1704 11/11/2017 2339 Full Code [id number]  Nigel Mormon, MD Inpatient   07/15/2017 1218 07/16/2017 1743 Full Code [id number]  Nigel Mormon, MD ED   Advance Care Planning Activity        IV Access:    Peripheral IV  Procedures and diagnostic studies:   DG Chest 2 View  Result Date: 04/06/2021 CLINICAL DATA:  Pacemaker placement EXAM: CHEST - 2 VIEW COMPARISON:  03/30/2021 FINDINGS: Interval replacement of pacemaker pack. Third lead added in the coronary sinus. Right atrium right ventricle leads unchanged. Cardiac enlargement without heart failure. Minimal pleural fluid bilaterally. Mild bibasilar atelectasis. No pneumothorax. IMPRESSION: Mild bibasilar  atelectasis and minimal pleural fluid. Satisfactory pacemaker position. Electronically Signed   By: Franchot Gallo M.D.   On: 04/06/2021 08:17     Medical Consultants:    None.   Subjective:    Holly [name] has no new complaints this morning.  Objective:    Vitals:   04/06/21 2353 04/06/21 2354 04/07/21 0408 04/07/21 0529  BP: (!) 99/59  106/61   Pulse:   60   Resp: 15  18   Temp: 98.1 F (36.7 C) 98.1 F (36.7 C) (!) 97.3 F (36.3 C)   TempSrc: Oral Oral Oral   SpO2: 97%  93%   Weight:    54.4 kg  Height:       SpO2: 93 % O2 Flow Rate (L/min): 2 L/min   Intake/Output Summary (Last 24 hours) at 04/07/2021 0948 Last data filed at 04/07/2021 0400 Gross per 24 hour  Intake 240 ml  Output 250 ml  Net -10 ml   Filed Weights   04/05/21 0309 04/06/21 0138 04/07/21 0529  Weight: 53.7 kg 54.2 kg 54.4 kg    Exam: General exam: In no acute distress. Respiratory system: Good air movement and clear to auscultation. Cardiovascular system: S1 & S2 heard, RRR. No JVD. Gastrointestinal system: Abdomen is nondistended, soft and nontender.  Extremities: No pedal edema. Skin: No rashes, lesions or ulcers Psychiatry: Judgement and insight appear normal. Mood & affect appropriate.   Data Reviewed:    Labs: Basic Metabolic Panel: Recent Labs  Lab 04/02/21 0440 04/03/21 0500 04/04/21 0500 04/04/21 0807 04/05/21 0343 04/06/21 0338 04/07/21 0220  NA 134*  --   --  134* 132* 135 133*  K 4.3  --   --  3.4* 3.6 4.4 3.9  CL 102  --   --  99 96* 99 100  CO2 25  --   --  '30 26 25 23  '$ GLUCOSE 91  --   --  130* 79 76 87  BUN 30*  --   --  34* 36* 36* 32*  CREATININE 1.52*  --   --  1.75* 1.84* 1.76* 1.79*  CALCIUM 10.9*  --   --  10.9* 10.7* 10.8* 11.0*  MG 2.0 2.1 2.0  --  2.1 2.2 2.1   GFR Estimated Creatinine Clearance: 21.5 mL/min (A) (by C-G formula based on SCr of 1.79 mg/dL (H)). Liver Function Tests: Recent Labs  Lab 04/01/21 0412  AST 20  ALT 19  ALKPHOS  35*  BILITOT 1.1  PROT 6.6  ALBUMIN 2.8*   No results for input(s): LIPASE, AMYLASE in the last 168 hours. No results for input(s): AMMONIA in the last 168 hours. Coagulation profile No results for input(s): INR, PROTIME in the last 168 hours. COVID-19 Labs  No results for input(s): DDIMER, FERRITIN, LDH, CRP in the last 72 hours.  Lab Results  Component Value Date   [place] NEGATIVE 03/26/2021    CBC: Recent Labs  Lab 04/03/21 0500 04/04/21 0500 04/05/21 0343 04/06/21 0338 04/07/21 0220  WBC 4.4 4.3 4.2 4.3 3.5*  HGB 10.2* 10.2* 10.0* 9.7* 9.5*  HCT 31.3* 31.6* 31.0* 30.5*  29.1*  MCV 86.2 87.1 86.8 86.6 86.1  PLT 166 166 163 164 160   Cardiac Enzymes: No results for input(s): CKTOTAL, CKMB, CKMBINDEX, TROPONINI in the last 168 hours. BNP (last 3 results) Recent Labs    07/20/20 1201  PROBNP 9,907*   CBG: No results for input(s): GLUCAP in the last 168 hours. D-Dimer: No results for input(s): DDIMER in the last 72 hours. Hgb A1c: No results for input(s): HGBA1C in the last 72 hours. Lipid Profile: No results for input(s): CHOL, HDL, LDLCALC, TRIG, CHOLHDL, LDLDIRECT in the last 72 hours. Thyroid function studies: No results for input(s): TSH, T4TOTAL, T3FREE, THYROIDAB in the last 72 hours.  Invalid input(s): FREET3 Anemia work up: No results for input(s): VITAMINB12, FOLATE, FERRITIN, TIBC, IRON, RETICCTPCT in the last 72 hours. Sepsis Labs: Recent Labs  Lab 04/04/21 0500 04/05/21 0343 04/06/21 0338 04/07/21 0220  WBC 4.3 4.2 4.3 3.5*   Microbiology Recent Results (from the past 240 hour(s))  Surgical PCR screen     Status: Abnormal   Collection Time: 04/04/21  3:26 PM   Specimen: Nasal Mucosa; Nasal Swab  Result Value Ref Range Status   MRSA, PCR NEGATIVE NEGATIVE Final   Staphylococcus aureus POSITIVE (A) NEGATIVE Final    Comment: (NOTE) The Xpert SA Assay (FDA approved for NASAL specimens in patients 1 years of age and older), is one  component of a comprehensive surveillance program. It is not intended to diagnose infection nor to guide or monitor treatment. Performed at [facility], [place] [address], [place], [place] [address]      Medications:   . vitamin C  1,000 mg Oral Daily  . Chlorhexidine Gluconate Cloth  6 each Topical Daily  . feeding supplement  237 mL Oral BID BM  . multivitamin with minerals  1 tablet Oral Daily  . mupirocin ointment  1 application Nasal BID  . polyethylene glycol  17 g Oral Daily  . rosuvastatin  10 mg Oral QPM  . senna-docusate  1 tablet Oral QHS  . sodium chloride flush  10-40 mL Intracatheter Q12H  . torsemide  40 mg Oral Daily   Continuous Infusions: . DOBUTamine 1.25 mcg/kg/min (04/06/21 0712)      LOS: 11 days   Charlynne Cousins  [facility]  04/07/2021, 9:48 AM

## 2021-04-08 ENCOUNTER — Other Ambulatory Visit: Payer: Self-pay

## 2021-04-08 ENCOUNTER — Encounter (HOSPITAL_COMMUNITY): Payer: Self-pay | Admitting: Internal Medicine

## 2021-04-08 LAB — BASIC METABOLIC PANEL WITH GFR
Anion gap: 8 (ref 5–15)
BUN: 39 mg/dL — ABNORMAL HIGH (ref 8–23)
CO2: 25 mmol/L (ref 22–32)
Calcium: 11.4 mg/dL — ABNORMAL HIGH (ref 8.9–10.3)
Chloride: 99 mmol/L (ref 98–111)
Creatinine, Ser: 1.76 mg/dL — ABNORMAL HIGH (ref 0.44–1.00)
GFR, Estimated: 29 mL/min — ABNORMAL LOW
Glucose, Bld: 94 mg/dL (ref 70–99)
Potassium: 4.6 mmol/L (ref 3.5–5.1)
Sodium: 132 mmol/L — ABNORMAL LOW (ref 135–145)

## 2021-04-08 LAB — MAGNESIUM: Magnesium: 2.3 mg/dL (ref 1.7–2.4)

## 2021-04-08 LAB — CBC
HCT: 31.2 % — ABNORMAL LOW (ref 36.0–46.0)
Hemoglobin: 10.1 g/dL — ABNORMAL LOW (ref 12.0–15.0)
MCH: 28.3 pg (ref 26.0–34.0)
MCHC: 32.4 g/dL (ref 30.0–36.0)
MCV: 87.4 fL (ref 80.0–100.0)
Platelets: 180 10*3/uL (ref 150–400)
RBC: 3.57 MIL/uL — ABNORMAL LOW (ref 3.87–5.11)
RDW: 16.7 % — ABNORMAL HIGH (ref 11.5–15.5)
WBC: 4.1 10*3/uL (ref 4.0–10.5)
nRBC: 0 % (ref 0.0–0.2)

## 2021-04-08 MED ORDER — FUROSEMIDE 10 MG/ML IJ SOLN
40.0000 mg | Freq: Once | INTRAMUSCULAR | Status: AC
  Administered 2021-04-08: 40 mg via INTRAVENOUS
  Filled 2021-04-08: qty 4

## 2021-04-08 MED ORDER — POLYETHYLENE GLYCOL 3350 17 G PO PACK
17.0000 g | PACK | Freq: Two times a day (BID) | ORAL | Status: DC
  Administered 2021-04-08: 17 g via ORAL

## 2021-04-08 MED ORDER — SORBITOL 70 % SOLN
400.0000 mL | TOPICAL_OIL | Freq: Once | ORAL | Status: AC
  Administered 2021-04-08: 400 mL via RECTAL
  Filled 2021-04-08: qty 120

## 2021-04-08 MED ORDER — METOPROLOL SUCCINATE ER 50 MG PO TB24: 25.0000 mg | ORAL_TABLET | Freq: Every day | ORAL | 3 refills | Status: AC

## 2021-04-08 MED ORDER — APIXABAN 2.5 MG PO TABS: 2.5000 mg | ORAL_TABLET | Freq: Two times a day (BID) | ORAL | 6 refills | Status: AC

## 2021-04-08 NOTE — Care Management (Cosign Needed)
    Durable Medical Equipment  (From admission, onward)         Start     Ordered   04/08/21 1219  For home use only DME Walker rolling  Once       Question Answer Comment  Walker: With 5 Inch Wheels   Patient needs a walker to treat with the following condition Generalized weakness      04/08/21 1218   04/08/21 1219  For home use only DME wheelchair cushion (seat and back)  Once        04/08/21 1218   04/08/21 1217  For home use only DME lightweight manual wheelchair with seat cushion  Once       Comments: Patient suffers from heart failurewhich impairs their ability to perform daily activities like bathing, dressing, grooming, and toileting in the home.  A walker will not resolve  issue with performing activities of daily living. A wheelchair will allow patient to safely perform daily activities. Patient is not able to propel themselves in the home using a standard weight wheelchair due to general weakness. Patient can self propel in the lightweight wheelchair. Length of need 6 months . Accessories: elevating leg rests (ELRs), wheel locks, extensions and anti-tippers.   04/08/21 1218

## 2021-04-08 NOTE — Progress Notes (Signed)
Discharge instructions reviewed with patient utilizing teach back method no questions at this time. Patient discharged to home with  Wheelchair and walker

## 2021-04-08 NOTE — Progress Notes (Signed)
Subjective:  Complains of abdominal pain Does not want to go home today.  S/p CS lead 6/2 Weaned off dobutamine   Objective:  Vital Signs in the last 24 hours: Temp:  [98.1 F (36.7 C)-98.9 F (37.2 C)] 98.3 F (36.8 C) (06/05 0800) Pulse Rate:  [60-64] 61 (06/05 0800) Resp:  [17-20] 19 (06/05 0800) BP: (95-103)/(53-62) 103/61 (06/05 0800) SpO2:  [95 %-100 %] 95 % (06/05 0349) Weight:  [54.4 kg] 54.4 kg (06/05 0349)  Intake/Output from previous day: 06/04 0701 - 06/05 0700 In: 120 [P.O.:120] Out: 500 [Urine:500]  Physical Exam Vitals and nursing note reviewed.  Constitutional:      General: She is not in acute distress.    Appearance: She is well-developed. She is cachectic.  HENT:     Head: Normocephalic and atraumatic.  Eyes:     Conjunctiva/sclera: Conjunctivae normal.     Pupils: Pupils are equal, round, and reactive to light.  Neck:     Vascular: JVD present.  Cardiovascular:     Rate and Rhythm: Normal rate and regular rhythm.     Pulses: Normal pulses and intact distal pulses.     Heart sounds: No murmur heard.   Pulmonary:     Effort: Pulmonary effort is normal.     Breath sounds: Examination of the right-middle field reveals rales. Examination of the right-lower field reveals rales. Rales (RLL) present. No wheezing.  Abdominal:     General: Bowel sounds are normal.     Palpations: Abdomen is soft.     Tenderness: There is no rebound.  Musculoskeletal:        General: No tenderness. Normal range of motion.     Right lower leg: No edema.     Left lower leg: No edema.  Lymphadenopathy:     Cervical: No cervical adenopathy.  Skin:    General: Skin is warm and dry.  Neurological:     Mental Status: She is alert and oriented to person, place, and time.     Cranial Nerves: No cranial nerve deficit.      Lab Results: BMP Recent Labs    05/19/20 1114 06/05/20 1311 08/25/20 1601 03/26/21 1523 04/06/21 0338 04/07/21 0220 04/08/21 0413  NA 130*  131* 132*   < > 135 133* 132*  K 4.7 4.5 4.8   < > 4.4 3.9 4.6  CL 95* 97 98   < > 99 100 99  CO2 17* 21 20   < > '25 23 25  '$ GLUCOSE 99 79 109*   < > 76 87 94  BUN 18 25 37*   < > 36* 32* 39*  CREATININE 1.21* 1.29* 1.36*   < > 1.76* 1.79* 1.76*  CALCIUM 12.1* 11.9* 10.4*   < > 10.8* 11.0* 11.4*  GFRNONAA 42* 39* 37*   < > 29* 28* 29*  GFRAA 49* 45* 42*  --   --   --   --    < > = values in this interval not displayed.    CBC Recent Labs  Lab 04/08/21 0413  WBC 4.1  RBC 3.57*  HGB 10.1*  HCT 31.2*  PLT 180  MCV 87.4  MCH 28.3  MCHC 32.4  RDW 16.7*     BNP (last 3 results) Recent Labs    06/05/20 1311 03/26/21 1523 03/30/21 0738  BNP 1,854.5* >4,500.0* >4,500.0*    Lipid Panel     Component Value Date/Time   CHOL 145 11/01/2019 1126   TRIG 49  11/01/2019 1126   HDL 76 11/01/2019 1126   CHOLHDL 1.9 11/01/2019 1126   LDLCALC 58 11/01/2019 1126     Hepatic Function Panel Recent Labs    03/28/21 0857 03/30/21 0738 03/31/21 0429 04/01/21 0412  PROT 7.0 6.9  --  6.6  ALBUMIN 3.1* 3.0* 2.8* 2.8*  AST 38 28  --  20  ALT 34 28  --  19  ALKPHOS 45 40  --  35*  BILITOT 1.7* 1.3*  --  1.1    CARDIAC STUDIES:  EKG 03/27/2021: AV dual paced rhythm  1 Brief NSVT episode on pacemaker interrogation on 02/14/2021 No recent alert  Echocardiogram 03/27/2021: 1. Left ventricular ejection fraction, by estimation, is <20%. The left  ventricle has severely decreased function. The left ventricle demonstrates  global hypokinesis. There is mild left ventricular hypertrophy. Left  ventricular diastolic parameters are  indeterminate.  2. Right ventricular systolic function is normal. The right ventricular  size is normal.  3. Left atrial size was severely dilated.  4. Right atrial size was severely dilated.  5. The mitral valve is normal in structure. Severe mitral valve  regurgitation. No evidence of mitral stenosis.  6. Tricuspid valve regurgitation is  severe.  7. The aortic valve is normal in structure. There is mild calcification  of the aortic valve. There is mild thickening of the aortic valve. Aortic  valve regurgitation is trivial. Mild to moderate aortic valve  sclerosis/calcification is present, without  any evidence of aortic stenosis.  8. The inferior vena cava is dilated in size with <50% respiratory  variability, suggesting right atrial pressure of 15 mmHg.   Assessment & Recommendations:  80 year old [place] American female with hypertension, dual-chamber pacemaker placement for AV block, moderate, medically managed coronary artery disease (LCx), hypertension, NSVT, HFrEF  Acute on chronic systolic heart failure: Acute decompensation. She has cardiac cachexia, with rales on exam. No other overt edema. LVEF <20%. 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 Currently on dobutamine, follow co-ox. Now s/p CRT-P on 6/2 Appreciate heart failure and EP input. Weaned off dobutamine this morning. Continue torsemide 40 mg PO daily. Given mild abdominal distension and Na 132, I suspect she has mild volume overload today. Will give IV lasix 40 mg once.  Will resume spironolactone and losartan outpatient after renal function recovery.  If she stays inpatient today, will resume metoprolol succinate 25 mg tomorrow.   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 Appreciate nutrition consult  AKI: Cardiorenal syndrome. Cr 1.5-->1.7-->1.8-->1.7 and stabilizing Continue torsemide 40 mg daily.  PAF: Resume eliquis 2.5 mg bid on 6/7.  Discharge planning: PT recommending Home health PT;Supervision for mobility/OOB;Supervision/Assistance - 24 hour    Nigel Mormon, MD Pager: [phone] Office: [phone]

## 2021-04-08 NOTE — TOC Transition Note (Signed)
Transition of Care [facility]) - CM/SW Discharge Note   Patient Details  Name: Holly [name] MRN: [id number] Date of Birth: [birth year]  Transition of Care [facility]) CM/SW Contact:  Bethena Roys, RN Phone Number: 04/08/2021, 12:56 PM   Clinical Narrative:  Case Manager spoke with patient regarding home health needs. Patient provided verbal permission for Case Manager to call daughter Ivin Booty regarding home health needs. Patient is from home with family support and per Ivin Booty, family will provide 24/7 supervision. Patient has DME rolling walker with seat, toilet riser, and shower chair. Patient is in need of  Rolling walker and wheelchair- orders placed and ordered via [employer]. DME will be delivered to the room prior to transition home. Daughter and patient had no preference for home health agencies, Case Manager reached out to [facility] for PT/OT-start of care to begin within 24-48 hours post transition home. No further needs from Case Manager at this time. Daughter to provide transportation home via private vehicle.  Final next level of care: [place] Barriers to Discharge: No Barriers Identified     Choice offered to / list presented to :  (Patient and daughter had no preference.)    Discharge Plan and Services In-house Referral: Clinical Social Work   Post Acute Care Choice: Home Health          DME Arranged: High strength lightweight manual wheelchair with seat cushion,Walker rolling DME Agency: [employer] Date DME Agency Contacted: 04/08/21 Time DME Agency Contacted: 1200 Representative spoke with at DME Agency: [place]: PT,OT [place]: [place] Date [place]: 04/08/21 Time [place]: 1202 Representative spoke with at [place]: [place] ([place]) Interventions Food Insecurity Interventions: Intervention Not Indicated,Other (Comment) (Patient reports receiving Food  Stamps) Financial Strain Interventions: Other (Comment) (Referral to outpatient HV CSW for help with light bill) Housing Interventions: Intervention Not Indicated Transportation Interventions: Intervention Not Indicated   Readmission Risk Interventions No flowsheet data found.

## 2021-04-08 NOTE — Discharge Summary (Signed)
Physician Discharge Summary  Holly [name] [id number] DOB: [birth year] DOA: 03/26/2021  PCP: Merrilee Seashore, MD  Admit date: 03/26/2021 Discharge date: 04/08/2021  Admitted From: Home Disposition:  Home  Recommendations for Outpatient Follow-up:  1. Follow up with Cardiology in 1-2 weeks 2. Please obtain BMP/CBC in one week   Home Health:No Equipment/Devices:none  Discharge Condition:Stable  CODE STATUS: Full Diet recommendation: Heart Healthy  Brief/Interim Summary:  80 y.o. female past medical history significant for heart block status post pacemaker, paroxysmal atrial fibrillation on Eliquis combined systolic and diastolic heart failure who comes in from the PCPs office due to abnormal labs when I saw her PCP due to increased shortness of breath for the last 2 months was found to be in acute combined systolic and diastolic heart failure and acute kidney injury sent to the ED, with CT of the abdomen pelvis showing anasarca BNP greater than 4000 and creatinine of 2.3 cardiology was consulted  Significant studies: CT of the abdomen and pelvis cholelithiasis but no acute cholecystitis, with anasarca of the abdominal wall and mesenteric lymphatics   Discharge Diagnoses:  Principal Problem:   AKI (acute kidney injury) ([name]) Active Problems:   Pacemaker   Essential hypertension   DNR (do not resuscitate) discussion   S/P placement of cardiac pacemaker   Acute systolic heart failure (HCC)   Paroxysmal A-fib ([name])   Cholelithiasis   Congestive heart failure ([name])   Malnutrition of moderate degree   Weakness generalized   Palliative care by specialist  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 At them the advanced heart failure team was consulted she was started on IV dobutamine and Lasix. As she diuresed well. Her Lasix was held. EP was consulted and performed a CRT T-P upgrade on 04/05/2021. she was  weaned off dobutamine.  Started on low-dose metoprolol and torsemide. It was discussed with cardiology. They recommended to follow-up with them this upcoming week with labs.  Acute kidney injury on chronic kidney disease stage III yea: Last creatinine on October 2021 was around 1.4 on admission her ARB was held. There is likely due to acute decompensated heart failure her creatinine improved to 1.7 with inotropes. She will follow-up with cardiology will her ARB will be restarted as an outpatient.  Severe mitral regurgitation: Likely functional, hopefully her CRT-P will improve some of her dysfunction will be 2D echo as an outpatient.  Paroxysmal atrial fibrillation: Her metoprolol was held due to hypotension. Her Eliquis will be resumed on 04/10/2021 as she had an upgrade on her pacing system.  Hypokalemia was repleted orally and monitor.  Choledocholithiasis without cholecystitis: noted.  Incidental left upper pole kidney cyst: Will need work-up by her PCP as an outpatient.  Essential hypertension: Losartan was discontinued and will be restarted as an outpatient by her cardiologist. She was started on a lower dose of metoprolol these were both held due to her acute kidney injury and being on inotropes. On day of discharge her blood pressure was relatively stable. She was restarted on low-dose of metoprolol and Aldactone which she will continue as an outpatient and be titrated as an outpatient as tolerated.  Discharge Instructions  Discharge Instructions    Diet - low sodium heart healthy   Complete by: As directed    Increase activity slowly   Complete by: As directed      Allergies as of 04/08/2021      Reactions   Entresto [sacubitril-valsartan]  Itching, Rash, Cough   Lisinopril Cough   Venlafaxine Rash      Medication List    STOP taking these medications   losartan 25 MG tablet Commonly known as: COZAAR   spironolactone 25 MG tablet Commonly known as: ALDACTONE      TAKE these medications   ALPRAZolam 0.25 MG tablet Commonly known as: XANAX Take 1 tablet by mouth daily as needed for anxiety.   apixaban 2.5 MG Tabs tablet Commonly known as: Eliquis Take 1 tablet (2.5 mg total) by mouth 2 (two) times daily. Start taking on: April 10, 2021 What changed:   when to take this  These instructions start on April 10, 2021. If you are unsure what to do until then, ask your doctor or other care provider.   clobetasol ointment 0.05 % Commonly known as: TEMOVATE Apply 1 application topically 2 (two) times daily as needed (itching).   clotrimazole-betamethasone cream Commonly known as: LOTRISONE 1 application in the morning and at bedtime.   Eucrisa 2 % Oint Generic drug: Crisaborole Apply 1 application topically daily as needed (dry skin).   metoprolol succinate 50 MG 24 hr tablet Commonly known as: TOPROL-XL Take 1 tablet (50 mg total) by mouth daily. Take with or immediately following a meal.   nitroGLYCERIN 0.4 MG SL tablet Commonly known as: NITROSTAT Place 1 tablet (0.4 mg total) under the tongue every 5 (five) minutes as needed for chest pain.   OLOPATADINE HCL OP Place 1 drop into both eyes 2 (two) times daily.   rosuvastatin 10 MG tablet Commonly known as: CRESTOR TAKE 1 TABLET BY MOUTH EVERY DAY What changed: when to take this   torsemide 20 MG tablet Commonly known as: DEMADEX Daily What changed:   how much to take  how to take this  when to take this  additional instructions   triamcinolone cream 0.1 % Commonly known as: KENALOG Apply topically 2 (two) times daily as needed.   vitamin C 1000 MG tablet Take 1,000 mg by mouth daily.       Allergies  Allergen Reactions  . Entresto [Sacubitril-Valsartan] Itching, Rash and Cough  . Lisinopril Cough  . Venlafaxine Rash    Consultations:  Cardiology   Procedures/Studies: CT ABDOMEN PELVIS WO CONTRAST  Result Date: 03/26/2021 CLINICAL DATA:  Presenting for  abnormal labs,, mechanical fall as well. EXAM: CT ABDOMEN AND PELVIS WITHOUT CONTRAST TECHNIQUE: Multidetector CT imaging of the abdomen and pelvis was performed following the standard protocol without IV contrast. COMPARISON:  CT 07/09/2017 FINDINGS: Lower chest: Chronic subpleural reticular changes noted in the lung bases. Additional atelectasis. Lung bases are otherwise clear. Cardiomegaly. Terminus of a pacer lead seen at the cardiac apex. No pericardial effusion. Hepatobiliary: Cyst again seen along the inferomedial liver measuring 1.9 cm in size (3/20), not significantly changed from prior. No concerning focal liver lesion. Punctate calcification in the left lobe liver, nonspecific. Normal hepatic attenuation. Smooth liver surface contour. Gallbladder appears contracted around a calcified gallstone measuring up to 2.2 cm in size. No pericholecystic fluid or inflammation. No significant biliary ductal dilatation accounting for expected senescent change. No intraductal gallstones. Pancreas: No pancreatic ductal dilatation or surrounding inflammatory changes. Spleen: Normal in size. No concerning splenic lesions. Adrenals/Urinary Tract: Normal adrenal glands. Redemonstration of a cyst arising the upper pole right kidney measuring 3.8 cm in size, decreased from comparison prior. Intermediate attenuation 11 mm cyst seen in the posterior upper pole left kidney (3/16) could reflect a hemorrhagic or proteinaceous cyst though  incompletely characterized on this exam. No other visible or contour deforming renal lesions. No urolithiasis or hydronephrosis. Urinary bladder is unremarkable for degree of distension. Stomach/Bowel: Distal esophagus, stomach and duodenal sweep are unremarkable. No small bowel wall thickening or dilatation. No evidence of obstruction. Appendix is not visualized. No focal inflammation the vicinity of the cecum to suggest an occult appendicitis. Scattered colonic diverticula without focal  inflammation to suggest diverticulitis. No colonic dilatation or wall thickening. Vascular/Lymphatic: Atherosclerotic calcifications within the abdominal aorta and branch vessels. No aneurysm or ectasia. Numerous though nonenlarged retroperitoneal nodes are seen. Possibly reactive or edematous. No pathologically enlarged abdominopelvic lymph nodes within the limitations of this unenhanced CT. Reproductive: Anteverted uterus. Benign myometrial vascular calcifications. No suspicious adnexal lesions. Other: Extensive circumferential body wall edema. Mild central mesenteric edematous changes. Musculoskeletal: The osseous structures appear diffusely demineralized which may limit detection of small or nondisplaced fractures. No acute fracture or vertebral body height loss. Dextrocurvature of the thoracolumbar spine, apex L3. Six lumbar vertebrae. Lowest disc space denoted as L6-S1. Multilevel degenerative changes are present in the imaged portions of the spine. Severe sclerotic changes and bony remodeling of the bilateral femoral heads likely reflecting sequela of prior avascular necrosis advanced arthrosis with bilateral acetabular protrusio, right greater than left. Some slight joint space expansion and synovial thickening noted about the right hip with few joint bodies. Additional joint bodies are noted in the left hip articulation but without the same extent of synovia prominence. No acute fracture or traumatic osseous injury of hips, pelvis. IMPRESSION: 1. No clear acute traumatic findings in the abdomen or pelvis. Unenhanced CT was performed per clinician order. Lack of IV contrast limits sensitivity and specificity, especially for evaluation of abdominal/pelvic solid viscera. 2. Features of developing anasarca with body wall edema, mesenteric edema and likely edematous central mesenteric lymph nodes. Cardiomegaly noted as well. Correlate with patient hydration status or clinical features of heart failure/volume  overload. 3. Indeterminate 11 mm hyperdense cystic focus in the upper pole left kidney. Suspect benign hemorrhagic or proteinaceous cyst though incompletely characterized. Consider renal ultrasound or MRI for further characterization 4. No other acute or worrisome urinary tract abnormality. 5. Cholelithiasis without evidence of acute cholecystitis. 6. Diverticulosis without evidence of acute diverticulitis. 7. Aortic Atherosclerosis (ICD10-I70.0). 8. Progressive and severe degenerative changes in the bilateral hips including features of prior avascular necrosis. Right greater than left synovial thickening and effusion, sterility is not completely ascertained on imaging. If there is clinical concern, fluid sampling should be considered. Electronically Signed   By: Lovena Le M.D.   On: 03/26/2021 22:05   DG Chest 1 View  Result Date: 03/30/2021 CLINICAL DATA:  Heart failure.  Cardiac pacemaker. EXAM: CHEST  1 VIEW COMPARISON:  04/24/2020 FINDINGS: Enlargement of the cardiac silhouette. There is a left-sided dual chamber cardiac pacemaker. S-shaped scoliosis in the thoracic spine. Linear density in the right lower chest is most compatible with a Mach line from overlying soft tissues. Lungs are clear without pulmonary edema. Severe degenerative changes at the right shoulder. Abnormal appearance of the left humeral head appears chronic. Atherosclerotic calcifications at the aortic arch. IMPRESSION: 1. Cardiomegaly without focal lung disease or overt pulmonary edema. 2. Stable appearance of the cardiac pacemaker. 3. Scoliosis. 4. Chronic changes in both shoulders. Electronically Signed   By: Markus Daft M.D.   On: 03/30/2021 10:34   DG Chest 2 View  Result Date: 04/06/2021 CLINICAL DATA:  Pacemaker placement EXAM: CHEST - 2 VIEW COMPARISON:  03/30/2021 FINDINGS:  Interval replacement of pacemaker pack. Third lead added in the coronary sinus. Right atrium right ventricle leads unchanged. Cardiac enlargement without  heart failure. Minimal pleural fluid bilaterally. Mild bibasilar atelectasis. No pneumothorax. IMPRESSION: Mild bibasilar atelectasis and minimal pleural fluid. Satisfactory pacemaker position. Electronically Signed   By: Franchot Gallo M.D.   On: 04/06/2021 08:17   US RENAL  Result Date: 03/27/2021 CLINICAL DATA:  Indeterminate left renal cyst EXAM: RENAL / URINARY TRACT ULTRASOUND COMPLETE COMPARISON:  CT 03/26/2021 FINDINGS: Right Kidney: Renal measurements: 8.6 x 3.9 x 4.5 cm = volume: 79 mL. Echogenicity within normal limits. No mass or hydronephrosis visualized. 4.2 cm simple cortical cyst is seen within the anterior interpolar region. Left Kidney: Renal measurements: At least 8.0 x 4.7 x 4.5 cm = volume: 88 mL. Evaluation of the left kidney is significantly limited by depth of the structure as well as overlying bowel gas. In particular, the upper pole is obscured and the renal cyst in question is not visualized. No hydronephrosis. No intrarenal masses or calcifications within the visualized segment. Preserved cortical thickness and normal cortical echogenicity. Bladder: Appears normal for degree of bladder distention. Other: None. IMPRESSION: Technically limited examination, due to overlying bowel gas, with nonvisualization of the questioned cystic lesion within the upper pole of the left kidney. Dedicated renal mass protocol CT or MRI examination with contrast is recommended for definitive characterization of the question lesion, if indicated. Electronically Signed   By: Fidela Salisbury MD   On: 03/27/2021 01:18   ECHOCARDIOGRAM COMPLETE  Result Date: 03/27/2021    ECHOCARDIOGRAM REPORT   Patient Name:   Holly [name] Date of Exam: 03/27/2021 Medical Rec #:  [id number]      Height:       59.0 in Accession #:    [id number]     Weight:       128.0 lb Date of Birth:  1941-06-23      BSA:          1.526 m Patient Age:    59 years       BP:           114/72 mmHg Patient Gender: F              HR:            61 bpm. Exam Location:  Inpatient Procedure: 2D Echo, Cardiac Doppler and Color Doppler Indications:    CHF  History:        Patient has no prior history of Echocardiogram examinations,                 most recent 08/22/2020. Pacemaker; Risk Factors:Hypertension.  Sonographer:    Cammy Brochure Referring Phys: [id number] [place] T TU IMPRESSIONS  1. Left ventricular ejection fraction, by estimation, is <20%. The left ventricle has severely decreased function. The left ventricle demonstrates global hypokinesis. There is mild left ventricular hypertrophy. Left ventricular diastolic parameters are indeterminate.  2. Right ventricular systolic function is normal. The right ventricular size is normal.  3. Left atrial size was severely dilated.  4. Right atrial size was severely dilated.  5. The mitral valve is normal in structure. Severe mitral valve regurgitation. No evidence of mitral stenosis.  6. Tricuspid valve regurgitation is severe.  7. The aortic valve is normal in structure. There is mild calcification of the aortic valve. There is mild thickening of the aortic valve. Aortic valve regurgitation is trivial. Mild to moderate aortic valve sclerosis/calcification is present, without  any evidence of aortic stenosis.  8. The inferior vena cava is dilated in size with <50% respiratory variability, suggesting right atrial pressure of 15 mmHg. FINDINGS  Left Ventricle: Left ventricular ejection fraction, by estimation, is <20%. The left ventricle has severely decreased function. The left ventricle demonstrates global hypokinesis. The left ventricular internal cavity size was normal in size. There is mild left ventricular hypertrophy. Left ventricular diastolic parameters are indeterminate. Right Ventricle: The right ventricular size is normal. No increase in right ventricular wall thickness. Right ventricular systolic function is normal. Left Atrium: Left atrial size was severely dilated. Right Atrium: Right atrial size  was severely dilated. Pericardium: There is no evidence of pericardial effusion. Mitral Valve: The mitral valve is normal in structure. Severe mitral valve regurgitation. No evidence of mitral valve stenosis. Tricuspid Valve: The tricuspid valve is normal in structure. Tricuspid valve regurgitation is severe. No evidence of tricuspid stenosis. Aortic Valve: The aortic valve is normal in structure. There is mild calcification of the aortic valve. There is mild thickening of the aortic valve. Aortic valve regurgitation is trivial. Mild to moderate aortic valve sclerosis/calcification is present,  without any evidence of aortic stenosis. Aortic valve mean gradient measures 3.0 mmHg. Aortic valve peak gradient measures 5.3 mmHg. Aortic valve area, by VTI measures 1.36 cm. Pulmonic Valve: The pulmonic valve was normal in structure. Pulmonic valve regurgitation is mild. No evidence of pulmonic stenosis. Aorta: The aortic root is normal in size and structure. Venous: The inferior vena cava is dilated in size with less than 50% respiratory variability, suggesting right atrial pressure of 15 mmHg. IAS/Shunts: No atrial level shunt detected by color flow Doppler. Additional Comments: A device lead is visualized.  LEFT VENTRICLE PLAX 2D LVIDd:         5.80 cm LVIDs:         5.20 cm LV PW:         1.50 cm LV IVS:        1.00 cm LVOT diam:     2.20 cm LV SV:         28 LV SV Index:   18 LVOT Area:     3.80 cm  RIGHT VENTRICLE            IVC RV Basal diam:  4.60 cm    IVC diam: 2.70 cm RV S prime:     8.93 cm/s TAPSE (M-mode): 1.7 cm LEFT ATRIUM             Index       RIGHT ATRIUM           Index LA diam:        5.10 cm 3.34 cm/m  RA Area:     19.30 cm LA Vol (A2C):   96.3 ml 63.12 ml/m RA Volume:   55.60 ml  36.44 ml/m LA Vol (A4C):   89.3 ml 58.53 ml/m LA Biplane Vol: 93.0 ml 60.96 ml/m  AORTIC VALVE AV Area (Vmax):    1.31 cm AV Area (Vmean):   1.32 cm AV Area (VTI):     1.36 cm AV Vmax:           115.00 cm/s AV  Vmean:          78.100 cm/s AV VTI:            0.205 m AV Peak Grad:      5.3 mmHg AV Mean Grad:      3.0 mmHg LVOT Vmax:  39.60 cm/s LVOT Vmean:        27.200 cm/s LVOT VTI:          0.074 m LVOT/AV VTI ratio: 0.36  AORTA Ao Root diam: 2.70 cm Ao Asc diam:  2.60 cm MITRAL VALVE                TRICUSPID VALVE MV Area (PHT): 4.15 cm     TR Peak grad:   45.4 mmHg MV Decel Time: 183 msec     TR Vmax:        337.00 cm/s MV E velocity: 106.00 cm/s                             SHUNTS                             Systemic VTI:  0.07 m                             Systemic Diam: 2.20 cm Candee Furbish MD Electronically signed by Candee Furbish MD Signature Date/Time: 03/27/2021/2:32:30 PM    Final    DG Hip Unilat W or Wo Pelvis 2-3 Views Right  Result Date: 03/26/2021 CLINICAL DATA:  80 year old female with fall and right hip pain. EXAM: DG HIP (WITH OR WITHOUT PELVIS) 2-3V RIGHT COMPARISON:  Pelvic radiograph dated 10/07/2018 FINDINGS: There is no acute fracture or dislocation. The bones are osteopenic. Severe bilateral hip arthritic changes with flattening of the left femoral head and right protrusio acetabuli. Degenerative changes of the visualized lower lumbar spine. The soft tissues are unremarkable. IMPRESSION: 1. No acute fracture or dislocation. 2. 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 Electronically Signed   By: Anner Crete M.D.   On: 03/26/2021 16:42   [place] EKG SITE RITE  Result Date: 03/30/2021 If Site Rite image not attached, placement could not be confirmed due to current cardiac rhythm.     Subjective: No complaints feels great  Discharge Exam: Vitals:   04/08/21 0349 04/08/21 0800  BP: (!) 98/57 103/61  Pulse: 60 61  Resp: 19 19  Temp: 98.1 F (36.7 C) 98.3 F (36.8 C)  SpO2: 95%    Vitals:   04/07/21 1942 04/08/21 0000 04/08/21 0349 04/08/21 0800  BP: 95/62 (!) 102/53 (!) 98/57 103/61  Pulse: 64 62 60 61  Resp: '19 17 19 19  '$ Temp:  98.1 F (36.7 C) 98.7 F (37.1 C) 98.1 F (36.7 C) 98.3 F (36.8 C)  TempSrc: Oral Oral Oral Oral  SpO2: 100% 100% 95%   Weight:   54.4 kg   Height:        General: Pt is alert, awake, not in acute distress Cardiovascular: RRR, S1/S2 +, no rubs, no gallops Respiratory: CTA bilaterally, no wheezing, no rhonchi Abdominal: Soft, NT, ND, bowel sounds + Extremities: no edema, no cyanosis    The results of significant diagnostics from this hospitalization (including imaging, microbiology, ancillary and laboratory) are listed below for reference.     Microbiology: Recent Results (from the past 240 hour(s))  Surgical PCR screen     Status: Abnormal   Collection Time: 04/04/21  3:26 PM   Specimen: Nasal Mucosa; Nasal Swab  Result Value Ref Range Status   MRSA, PCR NEGATIVE NEGATIVE Final   Staphylococcus aureus POSITIVE (A) NEGATIVE Final  Comment: (NOTE) The Xpert SA Assay (FDA approved for NASAL specimens in patients 63 years of age and older), is one component of a comprehensive surveillance program. It is not intended to diagnose infection nor to guide or monitor treatment. Performed at [facility], [place] [address], [place], [place] [address]      Labs: BNP (last 3 results) Recent Labs    06/05/20 1311 03/26/21 1523 03/30/21 0738  BNP 1,854.5* >4,500.0* [id number]*   Basic Metabolic Panel: Recent Labs  Lab 04/04/21 0500 04/04/21 0807 04/05/21 0343 04/06/21 0338 04/07/21 0220 04/08/21 0413  NA  --  134* 132* 135 133* 132*  K  --  3.4* 3.6 4.4 3.9 4.6  CL  --  99 96* 99 100 99  CO2  --  '30 26 25 23 25  '$ GLUCOSE  --  130* 79 76 87 94  BUN  --  34* 36* 36* 32* 39*  CREATININE  --  1.75* 1.84* 1.76* 1.79* 1.76*  CALCIUM  --  10.9* 10.7* 10.8* 11.0* 11.4*  MG 2.0  --  2.1 2.2 2.1 2.3   Liver Function Tests: No results for input(s): AST, ALT, ALKPHOS, BILITOT, PROT, ALBUMIN in the last 168 hours. No results for input(s): LIPASE, AMYLASE in the last 168  hours. No results for input(s): AMMONIA in the last 168 hours. CBC: Recent Labs  Lab 04/04/21 0500 04/05/21 0343 04/06/21 0338 04/07/21 0220 04/08/21 0413  WBC 4.3 4.2 4.3 3.5* 4.1  HGB 10.2* 10.0* 9.7* 9.5* 10.1*  HCT 31.6* 31.0* 30.5* 29.1* 31.2*  MCV 87.1 86.8 86.6 86.1 87.4  PLT 166 163 164 160 180   Cardiac Enzymes: No results for input(s): CKTOTAL, CKMB, CKMBINDEX, TROPONINI in the last 168 hours. BNP: Invalid input(s): POCBNP CBG: No results for input(s): GLUCAP in the last 168 hours. D-Dimer No results for input(s): DDIMER in the last 72 hours. Hgb A1c No results for input(s): HGBA1C in the last 72 hours. Lipid Profile No results for input(s): CHOL, HDL, LDLCALC, TRIG, CHOLHDL, LDLDIRECT in the last 72 hours. Thyroid function studies No results for input(s): TSH, T4TOTAL, T3FREE, THYROIDAB in the last 72 hours.  Invalid input(s): FREET3 Anemia work up No results for input(s): VITAMINB12, FOLATE, FERRITIN, TIBC, IRON, RETICCTPCT in the last 72 hours. Urinalysis    Component Value Date/Time   COLORURINE YELLOW 03/26/2021 2304   APPEARANCEUR CLEAR 03/26/2021 2304   LABSPEC 1.008 03/26/2021 2304   PHURINE 5.0 03/26/2021 2304   GLUCOSEU NEGATIVE 03/26/2021 2304   HGBUR NEGATIVE 03/26/2021 2304   BILIRUBINUR NEGATIVE 03/26/2021 2304   KETONESUR NEGATIVE 03/26/2021 2304   PROTEINUR NEGATIVE 03/26/2021 2304   UROBILINOGEN 0.2 07/11/2009 1942   NITRITE NEGATIVE 03/26/2021 2304   LEUKOCYTESUR NEGATIVE 03/26/2021 2304   Sepsis Labs Invalid input(s): PROCALCITONIN,  WBC,  LACTICIDVEN Microbiology Recent Results (from the past 240 hour(s))  Surgical PCR screen     Status: Abnormal   Collection Time: 04/04/21  3:26 PM   Specimen: Nasal Mucosa; Nasal Swab  Result Value Ref Range Status   MRSA, PCR NEGATIVE NEGATIVE Final   Staphylococcus aureus POSITIVE (A) NEGATIVE Final    Comment: (NOTE) The Xpert SA Assay (FDA approved for NASAL specimens in patients  40 years of age and older), is one component of a comprehensive surveillance program. It is not intended to diagnose infection nor to guide or monitor treatment. Performed at [facility], [place] [address], [place], [place] [address]      Time coordinating  discharge: Over 30 minutes  SIGNED:   Charlynne Cousins, MD  [facility] 04/08/2021, 9:06 AM Pager   If 7PM-7AM, please contact night-coverage [email or web address] Password [email or web address]

## 2021-04-09 ENCOUNTER — Telehealth: Payer: Self-pay

## 2021-04-10 ENCOUNTER — Telehealth: Payer: Self-pay

## 2021-04-10 DIAGNOSIS — I5042 Chronic combined systolic (congestive) and diastolic (congestive) heart failure: Secondary | ICD-10-CM | POA: Diagnosis not present

## 2021-04-10 DIAGNOSIS — I130 Hypertensive heart and chronic kidney disease with heart failure and stage 1 through stage 4 chronic kidney disease, or unspecified chronic kidney disease: Secondary | ICD-10-CM | POA: Diagnosis not present

## 2021-04-10 DIAGNOSIS — E440 Moderate protein-calorie malnutrition: Secondary | ICD-10-CM | POA: Diagnosis not present

## 2021-04-10 DIAGNOSIS — Z48812 Encounter for surgical aftercare following surgery on the circulatory system: Secondary | ICD-10-CM | POA: Diagnosis not present

## 2021-04-10 DIAGNOSIS — I5041 Acute combined systolic (congestive) and diastolic (congestive) heart failure: Secondary | ICD-10-CM | POA: Diagnosis not present

## 2021-04-10 DIAGNOSIS — N183 Chronic kidney disease, stage 3 unspecified: Secondary | ICD-10-CM | POA: Diagnosis not present

## 2021-04-10 NOTE — Telephone Encounter (Signed)
done

## 2021-04-12 DIAGNOSIS — N183 Chronic kidney disease, stage 3 unspecified: Secondary | ICD-10-CM | POA: Diagnosis not present

## 2021-04-12 DIAGNOSIS — E440 Moderate protein-calorie malnutrition: Secondary | ICD-10-CM | POA: Diagnosis not present

## 2021-04-12 DIAGNOSIS — I5042 Chronic combined systolic (congestive) and diastolic (congestive) heart failure: Secondary | ICD-10-CM | POA: Diagnosis not present

## 2021-04-12 DIAGNOSIS — Z48812 Encounter for surgical aftercare following surgery on the circulatory system: Secondary | ICD-10-CM | POA: Diagnosis not present

## 2021-04-12 DIAGNOSIS — I130 Hypertensive heart and chronic kidney disease with heart failure and stage 1 through stage 4 chronic kidney disease, or unspecified chronic kidney disease: Secondary | ICD-10-CM | POA: Diagnosis not present

## 2021-04-12 DIAGNOSIS — I5041 Acute combined systolic (congestive) and diastolic (congestive) heart failure: Secondary | ICD-10-CM | POA: Diagnosis not present

## 2021-04-13 ENCOUNTER — Telehealth: Payer: Self-pay | Admitting: Emergency Medicine

## 2021-04-13 ENCOUNTER — Ambulatory Visit: Payer: Medicare Other | Admitting: Cardiology

## 2021-04-13 ENCOUNTER — Encounter: Payer: Self-pay | Admitting: Cardiology

## 2021-04-13 ENCOUNTER — Other Ambulatory Visit: Payer: Self-pay

## 2021-04-13 VITALS — BP 108/65 | HR 60 | Resp 17 | Ht 64.0 in | Wt 122.0 lb

## 2021-04-13 DIAGNOSIS — I442 Atrioventricular block, complete: Secondary | ICD-10-CM | POA: Diagnosis not present

## 2021-04-13 DIAGNOSIS — Z950 Presence of cardiac pacemaker: Secondary | ICD-10-CM

## 2021-04-13 DIAGNOSIS — I5022 Chronic systolic (congestive) heart failure: Secondary | ICD-10-CM

## 2021-04-13 DIAGNOSIS — I25118 Atherosclerotic heart disease of native coronary artery with other forms of angina pectoris: Secondary | ICD-10-CM

## 2021-04-13 NOTE — Telephone Encounter (Signed)
-----   Message from [place], [place] sent at 04/13/2021 11:32 AM EDT ----- Marykay Lex guys!  Dr. Virgina Jock reached out asked if you could follow up with the pt.  She had CRT upgrade with CL on 6/2 He saw her at his office today she still had her tegaderm on and skin was itching, he said he removed it for her (left steri strips) But asked if you could call and follow up with her please.  THANKS

## 2021-04-13 NOTE — Progress Notes (Signed)
m  Follow up visit  Subjective:   Haze Rushing, female    DOB: 07/18/1941, 80 y.o.   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  Patient had previously opted not to proceed with CRT-P recommended to her in mid 2021.   She was diuresed and also placed on dobutamine given her low output symptoms.  She was also noted to have cardiorenal syndrome with acute kidney injury.  After showed ablation, she decided to proceed with the same.  Dr. Quentin Ore successfully placed coronary sinus lead.  Renal function stabilized.  Patient's volume status improved.  She was discharged on 04/08/2021 on metoprolol. ARNI/MRA were held due to renal dysfunction.   Since discharge, she has not had any dyspnea, leg edema. She has "good days" and "bad days". She has noticed itching at her PPM site. She still has the tegaderm dressing on top of the steristrips.    Current Outpatient Medications on File Prior to Visit  Medication Sig Dispense Refill   ALPRAZolam (XANAX) 0.25 MG tablet Take 1 tablet by mouth daily as needed for anxiety.      apixaban (ELIQUIS) 2.5 MG TABS tablet Take 1 tablet (2.5 mg total) by mouth 2 (two) times daily. 60 tablet 6   Ascorbic Acid (VITAMIN C) 1000 MG tablet Take 1,000 mg by mouth daily.     clobetasol ointment (TEMOVATE) 4.65 % Apply 1 application topically 2 (two) times daily as needed (itching).   1   clotrimazole-betamethasone (LOTRISONE) cream 1 application in the morning and at bedtime.     EUCRISA 2 % OINT Apply 1 application topically daily as needed (dry skin).      metoprolol succinate (TOPROL-XL) 50 MG 24 hr tablet Take 0.5 tablets (25 mg total)  by mouth daily. Take with or immediately following a meal. 15 tablet 3   nitroGLYCERIN (NITROSTAT) 0.4 MG SL tablet Place 1 tablet (0.4 mg total) under the tongue every 5 (five) minutes as needed for chest pain. 90 tablet 3   OLOPATADINE HCL OP Place 1 drop into both eyes 2 (two) times daily.     rosuvastatin (CRESTOR) 10 MG tablet TAKE 1 TABLET BY MOUTH EVERY DAY (Patient taking differently: Take 10 mg by mouth every evening.) 90 tablet 2   torsemide (DEMADEX) 20 MG tablet Daily (Patient taking differently: Take 20 mg by mouth daily.) 90 tablet 1   triamcinolone cream (KENALOG) 0.1 % Apply topically 2 (two) times daily as needed.     No current facility-administered medications on file prior to visit.    Cardiovascular & other pertient studies:  BiV upgrade 04/05/2021: 1.  Left upper extremity venography.  2.  Existing dual-chamber pacemaker generator removal  3.  Coronary sinus lead addition  EKG 03/27/2021: AV dual paced rhythm  Echocardiogram 03/27/2021: 1. Left ventricular ejection fraction, by estimation, is <20%. The left  ventricle has severely decreased function. The left ventricle demonstrates  global hypokinesis. There is mild left ventricular hypertrophy. Left  ventricular diastolic parameters are  indeterminate.   2. Right ventricular systolic function is normal. The right ventricular  size is normal.   3. Left atrial size was severely dilated.  4. Right atrial size was severely dilated.   5. The mitral valve is normal in structure. Severe mitral valve  regurgitation. No evidence of mitral stenosis.   6. Tricuspid valve regurgitation is severe.   7. The aortic valve is normal in structure. There is mild calcification  of the aortic valve. There is mild thickening of the aortic valve. Aortic  valve regurgitation is trivial. Mild to moderate aortic valve  sclerosis/calcification is present, without  any evidence of aortic stenosis.   8. The inferior vena cava is dilated  in size with <50% respiratory  variability, suggesting right atrial pressure of 15 mmHg.  Lexiscan myoview stress test 08/04/2017:  1. Non-diagnostic due to paced rhythm. 2. Study quality: good. The left ventricular chamber dimensions are normal. There is medium scar of the inferolateral segment with moderate to severe residual ischemia. There is small scar of the inferior segment with very mild residual ischemia. 3. Systolic function is mildly reduced. The calculated stress EF is at 44 %. Gated SPECT imaging demonstrates hypokinesis in the inferior, lateral segment(s). 5. Intermediate risk study. Clinical correlation recommended.   Coronary angiogram 11/11/2017: LM: Normal LAD: D1 40% stneoses LCx: Tandem 50% stenoses in mid LCx RCA: Normal   Moderate coronary artery disease. She does have abnormal stress test in inferolateral territory, they are out of proportion to the coronary artery findings. Furthermore, she does not have any critical unstable lesion, to explain nonsustained ventricular tachycardia. She does not have any ongoing chest pain symptoms at this time.Recommend aggressive medical management at this time. If true anginal symptoms occur, could then perform PCI to mid circumflex.   Pacemaker Implant 07/16/2017:  Medtronic Azure XT DR MRI Sure Scan (serial number [id number]) dual chamber pacemaker 07/16/2017 by [facility] for complete heart block.    Recent labs: 04/08/2021: Glucose 94, BUN/Cr 39/1.76. EGFR 29. Na/K 132/4.6.  H/H 10/31. MCV 87. Platelets 180  10/2019: Chol 145, TG 49, HDL 76, LDL 58   Review of Systems  Constitutional: Positive for malaise/fatigue.  Cardiovascular:  Negative for chest pain, dyspnea on exertion, leg swelling, palpitations and syncope.  Skin:  Positive for itching.       Itching over PPM site         Vitals:   04/13/21 1031  BP: 108/65  Pulse: 60  Resp: 17  SpO2: 97%    Body mass index is 20.6 kg/m. Filed Weights   04/13/21 1031   Weight: 122 lb (55.3 kg)     Objective:   Physical Exam Vitals and nursing note reviewed.  Constitutional:      General: She is not in acute distress. Neck:     Vascular: No JVD.  Cardiovascular:     Rate and Rhythm: Normal rate and regular rhythm.     Heart sounds: Normal heart sounds. No murmur heard. Pulmonary:     Effort: Pulmonary effort is normal.     Breath sounds: Normal breath sounds. No wheezing or rales.  Musculoskeletal:     Right lower leg: No edema.     Left lower leg: No edema.  Skin:    Comments: No erythmema, hematoma over PPM site. 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 LVEF <  20% (03/2021). I am optimistic that this would improve now that she has undergone BiV upgrade. Continue metoprolol succinate 25 mg daily. Hold off ARBI/MRA until renal function stabilization Continue torsemide 40 mg PO daily.  Check BMP in 2 weeks I removed the tegaderm dressing. Keep wound check visit with EP.   CKD: Cardiorenal syndrome. Check BMP in 1-2 weeks   PAF: Continue eliquis 2.5 mg bid  Flat affect: I suspect she may have depression. Recommend f/u w/PCP  Generalized itching: I do not think this is due to heart failure, as her bilirubin is normal Check with PCP    Nigel Mormon, MD Pager: [phone] Office: [phone]

## 2021-04-13 NOTE — Telephone Encounter (Signed)
Contacted patient and she reports no edema, pain or drainage from steristrips. Education done on steri-strips and lifting, pulling and pushing restrictions with left arm. Patient asked that her daughter Leanord Asal .  LMOM to call [facility] to confirm appointment 04/21/21 for wound check at patient request.

## 2021-04-16 DIAGNOSIS — E440 Moderate protein-calorie malnutrition: Secondary | ICD-10-CM | POA: Diagnosis not present

## 2021-04-16 DIAGNOSIS — I5041 Acute combined systolic (congestive) and diastolic (congestive) heart failure: Secondary | ICD-10-CM | POA: Diagnosis not present

## 2021-04-16 DIAGNOSIS — Z48812 Encounter for surgical aftercare following surgery on the circulatory system: Secondary | ICD-10-CM | POA: Diagnosis not present

## 2021-04-16 DIAGNOSIS — N183 Chronic kidney disease, stage 3 unspecified: Secondary | ICD-10-CM | POA: Diagnosis not present

## 2021-04-16 DIAGNOSIS — I5042 Chronic combined systolic (congestive) and diastolic (congestive) heart failure: Secondary | ICD-10-CM | POA: Diagnosis not present

## 2021-04-16 DIAGNOSIS — I130 Hypertensive heart and chronic kidney disease with heart failure and stage 1 through stage 4 chronic kidney disease, or unspecified chronic kidney disease: Secondary | ICD-10-CM | POA: Diagnosis not present

## 2021-04-18 DIAGNOSIS — I130 Hypertensive heart and chronic kidney disease with heart failure and stage 1 through stage 4 chronic kidney disease, or unspecified chronic kidney disease: Secondary | ICD-10-CM | POA: Diagnosis not present

## 2021-04-18 DIAGNOSIS — N183 Chronic kidney disease, stage 3 unspecified: Secondary | ICD-10-CM | POA: Diagnosis not present

## 2021-04-18 DIAGNOSIS — I5041 Acute combined systolic (congestive) and diastolic (congestive) heart failure: Secondary | ICD-10-CM | POA: Diagnosis not present

## 2021-04-18 DIAGNOSIS — I5042 Chronic combined systolic (congestive) and diastolic (congestive) heart failure: Secondary | ICD-10-CM | POA: Diagnosis not present

## 2021-04-18 DIAGNOSIS — E440 Moderate protein-calorie malnutrition: Secondary | ICD-10-CM | POA: Diagnosis not present

## 2021-04-18 DIAGNOSIS — Z48812 Encounter for surgical aftercare following surgery on the circulatory system: Secondary | ICD-10-CM | POA: Diagnosis not present

## 2021-04-19 ENCOUNTER — Ambulatory Visit (INDEPENDENT_AMBULATORY_CARE_PROVIDER_SITE_OTHER): Payer: Medicare Other

## 2021-04-19 ENCOUNTER — Other Ambulatory Visit: Payer: Self-pay

## 2021-04-19 DIAGNOSIS — I5043 Acute on chronic combined systolic (congestive) and diastolic (congestive) heart failure: Secondary | ICD-10-CM

## 2021-04-19 DIAGNOSIS — I442 Atrioventricular block, complete: Secondary | ICD-10-CM

## 2021-04-19 LAB — CUP PACEART INCLINIC DEVICE CHECK
Battery Remaining Longevity: 108 mo
Battery Voltage: 3.2 V
Brady Statistic AP VP Percent: 79.88 %
Brady Statistic AP VS Percent: 0.17 %
Brady Statistic AS VP Percent: 18.45 %
Brady Statistic AS VS Percent: 1.5 %
Brady Statistic RA Percent Paced: 80.71 %
Brady Statistic RV Percent Paced: 98.33 %
Date Time Interrogation Session: 20220616101328
Implantable Lead Implant Date: 20180911
Implantable Lead Implant Date: 20180911
Implantable Lead Implant Date: 20220602
Implantable Lead Location: 753858
Implantable Lead Location: 753859
Implantable Lead Location: 753860
Implantable Lead Model: 4598
Implantable Lead Model: 5076
Implantable Lead Model: 5076
Implantable Pulse Generator Implant Date: 20220602
Lead Channel Impedance Value: 247 Ohm
Lead Channel Impedance Value: 266 Ohm
Lead Channel Impedance Value: 285 Ohm
Lead Channel Impedance Value: 304 Ohm
Lead Channel Impedance Value: 342 Ohm
Lead Channel Impedance Value: 399 Ohm
Lead Channel Impedance Value: 418 Ohm
Lead Channel Impedance Value: 418 Ohm
Lead Channel Impedance Value: 437 Ohm
Lead Channel Impedance Value: 513 Ohm
Lead Channel Impedance Value: 551 Ohm
Lead Channel Impedance Value: 608 Ohm
Lead Channel Impedance Value: 646 Ohm
Lead Channel Impedance Value: 684 Ohm
Lead Channel Pacing Threshold Amplitude: 0.625 V
Lead Channel Pacing Threshold Amplitude: 0.75 V
Lead Channel Pacing Threshold Amplitude: 1.75 V
Lead Channel Pacing Threshold Pulse Width: 0.4 ms
Lead Channel Pacing Threshold Pulse Width: 0.4 ms
Lead Channel Pacing Threshold Pulse Width: 0.4 ms
Lead Channel Sensing Intrinsic Amplitude: 1 mV
Lead Channel Setting Pacing Amplitude: 1.5 V
Lead Channel Setting Pacing Amplitude: 2 V
Lead Channel Setting Pacing Amplitude: 3.25 V
Lead Channel Setting Pacing Pulse Width: 0.4 ms
Lead Channel Setting Pacing Pulse Width: 0.4 ms
Lead Channel Setting Sensing Sensitivity: 0.9 mV

## 2021-04-19 NOTE — Progress Notes (Signed)
"  After Your Pacemaker" instruction reviewed and given to patient with verbal understanding.   Wound check appointment. Steri-strips removed. Wound without redness or edema. Incision edges approximated, wound well healed. Normal device function. RA/RV/LV leads on auto to allow extra safety output.  Histogram distribution appropriate for patient and level of activity, appear flat. No mode switches or ventricular arrhythmias noted. Patient educated about wound care, arm mobility, lifting restrictions, shock plan. ROV in 3 months with implanting physician.  Patient education completed including shock plan. Auditory/vibratory alert demonstrated.

## 2021-04-20 DIAGNOSIS — E440 Moderate protein-calorie malnutrition: Secondary | ICD-10-CM | POA: Diagnosis not present

## 2021-04-20 DIAGNOSIS — Z48812 Encounter for surgical aftercare following surgery on the circulatory system: Secondary | ICD-10-CM | POA: Diagnosis not present

## 2021-04-20 DIAGNOSIS — I5041 Acute combined systolic (congestive) and diastolic (congestive) heart failure: Secondary | ICD-10-CM | POA: Diagnosis not present

## 2021-04-20 DIAGNOSIS — I5042 Chronic combined systolic (congestive) and diastolic (congestive) heart failure: Secondary | ICD-10-CM | POA: Diagnosis not present

## 2021-04-20 DIAGNOSIS — I130 Hypertensive heart and chronic kidney disease with heart failure and stage 1 through stage 4 chronic kidney disease, or unspecified chronic kidney disease: Secondary | ICD-10-CM | POA: Diagnosis not present

## 2021-04-20 DIAGNOSIS — N183 Chronic kidney disease, stage 3 unspecified: Secondary | ICD-10-CM | POA: Diagnosis not present

## 2021-04-23 DIAGNOSIS — Z48812 Encounter for surgical aftercare following surgery on the circulatory system: Secondary | ICD-10-CM | POA: Diagnosis not present

## 2021-04-23 DIAGNOSIS — E440 Moderate protein-calorie malnutrition: Secondary | ICD-10-CM | POA: Diagnosis not present

## 2021-04-23 DIAGNOSIS — N183 Chronic kidney disease, stage 3 unspecified: Secondary | ICD-10-CM | POA: Diagnosis not present

## 2021-04-23 DIAGNOSIS — Z950 Presence of cardiac pacemaker: Secondary | ICD-10-CM | POA: Insufficient documentation

## 2021-04-23 DIAGNOSIS — I5042 Chronic combined systolic (congestive) and diastolic (congestive) heart failure: Secondary | ICD-10-CM | POA: Diagnosis not present

## 2021-04-23 DIAGNOSIS — I5041 Acute combined systolic (congestive) and diastolic (congestive) heart failure: Secondary | ICD-10-CM | POA: Diagnosis not present

## 2021-04-23 DIAGNOSIS — I130 Hypertensive heart and chronic kidney disease with heart failure and stage 1 through stage 4 chronic kidney disease, or unspecified chronic kidney disease: Secondary | ICD-10-CM | POA: Diagnosis not present

## 2021-04-23 HISTORY — DX: Presence of cardiac pacemaker: Z95.0

## 2021-04-23 NOTE — Telephone Encounter (Signed)
done

## 2021-04-25 DIAGNOSIS — Z48812 Encounter for surgical aftercare following surgery on the circulatory system: Secondary | ICD-10-CM | POA: Diagnosis not present

## 2021-04-25 DIAGNOSIS — I5041 Acute combined systolic (congestive) and diastolic (congestive) heart failure: Secondary | ICD-10-CM | POA: Diagnosis not present

## 2021-04-25 DIAGNOSIS — E440 Moderate protein-calorie malnutrition: Secondary | ICD-10-CM | POA: Diagnosis not present

## 2021-04-25 DIAGNOSIS — I5042 Chronic combined systolic (congestive) and diastolic (congestive) heart failure: Secondary | ICD-10-CM | POA: Diagnosis not present

## 2021-04-25 DIAGNOSIS — I130 Hypertensive heart and chronic kidney disease with heart failure and stage 1 through stage 4 chronic kidney disease, or unspecified chronic kidney disease: Secondary | ICD-10-CM | POA: Diagnosis not present

## 2021-04-25 DIAGNOSIS — N183 Chronic kidney disease, stage 3 unspecified: Secondary | ICD-10-CM | POA: Diagnosis not present

## 2021-04-26 ENCOUNTER — Encounter: Payer: Self-pay | Admitting: Cardiology

## 2021-04-26 ENCOUNTER — Other Ambulatory Visit: Payer: Self-pay

## 2021-04-26 ENCOUNTER — Telehealth: Payer: Medicare Other | Admitting: Cardiology

## 2021-04-26 VITALS — BP 115/69 | HR 61 | Ht 64.0 in | Wt 120.0 lb

## 2021-04-26 DIAGNOSIS — I5041 Acute combined systolic (congestive) and diastolic (congestive) heart failure: Secondary | ICD-10-CM | POA: Diagnosis not present

## 2021-04-26 DIAGNOSIS — I5042 Chronic combined systolic (congestive) and diastolic (congestive) heart failure: Secondary | ICD-10-CM | POA: Diagnosis not present

## 2021-04-26 DIAGNOSIS — R072 Precordial pain: Secondary | ICD-10-CM | POA: Insufficient documentation

## 2021-04-26 DIAGNOSIS — E440 Moderate protein-calorie malnutrition: Secondary | ICD-10-CM | POA: Diagnosis not present

## 2021-04-26 DIAGNOSIS — Z48812 Encounter for surgical aftercare following surgery on the circulatory system: Secondary | ICD-10-CM | POA: Diagnosis not present

## 2021-04-26 DIAGNOSIS — N183 Chronic kidney disease, stage 3 unspecified: Secondary | ICD-10-CM | POA: Diagnosis not present

## 2021-04-26 DIAGNOSIS — I130 Hypertensive heart and chronic kidney disease with heart failure and stage 1 through stage 4 chronic kidney disease, or unspecified chronic kidney disease: Secondary | ICD-10-CM | POA: Diagnosis not present

## 2021-04-26 NOTE — Progress Notes (Signed)
Virtual visit  Subjective:   Haze Rushing, female    DOB: 08/10/1941, 80 y.o.   MRN: [id number]  I connected with the patient on 04/26/2021 by a telephone call and verified that I am speaking with the correct person using two identifiers.     I offered the patient a video enabled application for a virtual visit. Unfortunately, this could not be accomplished due to technical difficulties/lack of video enabled phone/computer. I discussed the limitations of evaluation and management by telemedicine and the availability of in person appointments. The patient expressed understanding and agreed to proceed.   This visit type was conducted due to national recommendations for restrictions regarding the COVID-19 Pandemic (e.g. social distancing).  This format is felt to be most appropriate for this patient at this time.  All issues noted in this document were discussed and addressed.  No physical exam was performed (except for noted visual exam findings with Tele health visits).  The patient has consented to conduct a Tele health visit and understands insurance will be billed.   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 Patient has had improvement in her shortness of breath, fatigue, and leg swelling. However, she has had tightness under her right breast. She stated that this started after taking Miralax and milk of magnesia for her constipation.   Current Outpatient Medications on File Prior to Visit  Medication Sig Dispense Refill   ALPRAZolam (XANAX) 0.25 MG tablet Take 1 tablet by mouth daily as needed for anxiety.      apixaban (ELIQUIS) 2.5 MG TABS tablet Take 1 tablet (2.5 mg total) by mouth 2  (two) times daily. 60 tablet 6   Ascorbic Acid (VITAMIN C) 1000 MG tablet Take 1,000 mg by mouth daily.     Biotin 5000 MCG CAPS Take 1 capsule by mouth daily.     clobetasol ointment (TEMOVATE) 4.09 % Apply 1 application topically 2 (two) times daily as needed (itching).   1   clotrimazole-betamethasone (LOTRISONE) cream 1 application in the morning and at bedtime.     EUCRISA 2 % OINT Apply 1 application topically daily as needed (dry skin).      metoprolol succinate (TOPROL-XL) 50 MG 24 hr tablet Take 0.5 tablets (25 mg total) by mouth daily. Take with or immediately following a meal. 15 tablet 3   nitroGLYCERIN (NITROSTAT) 0.4 MG SL tablet Place 1 tablet (0.4 mg total) under the tongue every 5 (five) minutes as needed for chest pain. 90 tablet 3   OLOPATADINE HCL OP Place 1 drop into both eyes 2 (two) times daily.     rosuvastatin (CRESTOR) 10 MG tablet TAKE 1 TABLET BY MOUTH EVERY DAY (Patient taking differently: Take 10 mg by mouth every evening.) 90 tablet 2   torsemide (DEMADEX) 20 MG tablet Daily (Patient taking differently: Take 20 mg by mouth daily.) 90 tablet 1   triamcinolone cream (KENALOG) 0.1 % Apply topically 2 (two) times daily as needed.     Vitamin D, Ergocalciferol, 50 MCG (2000 UT) CAPS Take 1 capsule by mouth daily.     No current facility-administered medications on file prior to visit.    Cardiovascular & other pertient studies:  BiV upgrade  04/05/2021: 1.  Left upper extremity venography.  2.  Existing dual-chamber pacemaker generator removal  3.  Coronary sinus lead addition  EKG 03/27/2021: AV dual paced rhythm  Echocardiogram 03/27/2021: 1. Left ventricular ejection fraction, by estimation, is <20%. The left  ventricle has severely decreased function. The left ventricle demonstrates  global hypokinesis. There is mild left ventricular hypertrophy. Left  ventricular diastolic parameters are  indeterminate.   2. Right ventricular systolic function is normal. The  right ventricular  size is normal.   3. Left atrial size was severely dilated.   4. Right atrial size was severely dilated.   5. The mitral valve is normal in structure. Severe mitral valve  regurgitation. No evidence of mitral stenosis.   6. Tricuspid valve regurgitation is severe.   7. The aortic valve is normal in structure. There is mild calcification  of the aortic valve. There is mild thickening of the aortic valve. Aortic  valve regurgitation is trivial. Mild to moderate aortic valve  sclerosis/calcification is present, without  any evidence of aortic stenosis.   8. The inferior vena cava is dilated in size with <50% respiratory  variability, suggesting right atrial pressure of 15 mmHg.  Lexiscan myoview stress test 08/04/2017:  1. Non-diagnostic due to paced rhythm. 2. Study quality: good. The left ventricular chamber dimensions are normal. There is medium scar of the inferolateral segment with moderate to severe residual ischemia. There is small scar of the inferior segment with very mild residual ischemia. 3. Systolic function is mildly reduced. The calculated stress EF is at 44 %. Gated SPECT imaging demonstrates hypokinesis in the inferior, lateral segment(s). 5. Intermediate risk study. Clinical correlation recommended.   Coronary angiogram 11/11/2017: LM: Normal LAD: D1 40% stneoses LCx: Tandem 50% stenoses in mid LCx RCA: Normal   Moderate coronary artery disease. She does have abnormal stress test in inferolateral territory, they are out of proportion to the coronary artery findings. Furthermore, she does not have any critical unstable lesion, to explain nonsustained ventricular tachycardia. She does not have any ongoing chest pain symptoms at this time.Recommend aggressive medical management at this time. If true anginal symptoms occur, could then perform PCI to mid circumflex.   Pacemaker Implant 07/16/2017:  Medtronic Azure XT DR MRI Sure Scan (serial number [id number])  dual chamber pacemaker 07/16/2017 by [facility] for complete heart block.    Recent labs: 04/08/2021: Glucose 94, BUN/Cr 39/1.76. EGFR 29. Na/K 132/4.6.  H/H 10/31. MCV 87. Platelets 180  10/2019: Chol 145, TG 49, HDL 76, LDL 58   Review of Systems  Constitutional: Negative for malaise/fatigue.  Cardiovascular:  Positive for chest pain. Negative for dyspnea on exertion, leg swelling, palpitations and syncope.  Skin:  Positive for itching.       Itching over PPM site         Vitals:   04/26/21 0902  BP: 115/69  Pulse: 61    Body mass index is 20.6 kg/m. Filed Weights   04/26/21 0902  Weight: 120 lb (54.4 kg)     Objective:   Physical Exam  Not performed. 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 With her paced rhythm, EKG alone would not be adequate to rule out ACS. I recommend evaluation in urgent care/ER.  Nigel Mormon, MD Pager: [phone] Office: [phone]

## 2021-04-27 DIAGNOSIS — K219 Gastro-esophageal reflux disease without esophagitis: Secondary | ICD-10-CM | POA: Diagnosis not present

## 2021-04-27 DIAGNOSIS — R1031 Right lower quadrant pain: Secondary | ICD-10-CM | POA: Diagnosis not present

## 2021-04-27 DIAGNOSIS — R29898 Other symptoms and signs involving the musculoskeletal system: Secondary | ICD-10-CM | POA: Diagnosis not present

## 2021-04-30 DIAGNOSIS — E440 Moderate protein-calorie malnutrition: Secondary | ICD-10-CM | POA: Diagnosis not present

## 2021-04-30 DIAGNOSIS — I130 Hypertensive heart and chronic kidney disease with heart failure and stage 1 through stage 4 chronic kidney disease, or unspecified chronic kidney disease: Secondary | ICD-10-CM | POA: Diagnosis not present

## 2021-04-30 DIAGNOSIS — I5042 Chronic combined systolic (congestive) and diastolic (congestive) heart failure: Secondary | ICD-10-CM | POA: Diagnosis not present

## 2021-04-30 DIAGNOSIS — I5041 Acute combined systolic (congestive) and diastolic (congestive) heart failure: Secondary | ICD-10-CM | POA: Diagnosis not present

## 2021-04-30 DIAGNOSIS — N183 Chronic kidney disease, stage 3 unspecified: Secondary | ICD-10-CM | POA: Diagnosis not present

## 2021-04-30 DIAGNOSIS — Z48812 Encounter for surgical aftercare following surgery on the circulatory system: Secondary | ICD-10-CM | POA: Diagnosis not present

## 2021-05-01 DIAGNOSIS — I5042 Chronic combined systolic (congestive) and diastolic (congestive) heart failure: Secondary | ICD-10-CM | POA: Diagnosis not present

## 2021-05-01 DIAGNOSIS — I130 Hypertensive heart and chronic kidney disease with heart failure and stage 1 through stage 4 chronic kidney disease, or unspecified chronic kidney disease: Secondary | ICD-10-CM | POA: Diagnosis not present

## 2021-05-01 DIAGNOSIS — E440 Moderate protein-calorie malnutrition: Secondary | ICD-10-CM | POA: Diagnosis not present

## 2021-05-01 DIAGNOSIS — Z48812 Encounter for surgical aftercare following surgery on the circulatory system: Secondary | ICD-10-CM | POA: Diagnosis not present

## 2021-05-01 DIAGNOSIS — N183 Chronic kidney disease, stage 3 unspecified: Secondary | ICD-10-CM | POA: Diagnosis not present

## 2021-05-01 DIAGNOSIS — I5041 Acute combined systolic (congestive) and diastolic (congestive) heart failure: Secondary | ICD-10-CM | POA: Diagnosis not present

## 2021-05-03 DIAGNOSIS — N183 Chronic kidney disease, stage 3 unspecified: Secondary | ICD-10-CM | POA: Diagnosis not present

## 2021-05-03 DIAGNOSIS — I130 Hypertensive heart and chronic kidney disease with heart failure and stage 1 through stage 4 chronic kidney disease, or unspecified chronic kidney disease: Secondary | ICD-10-CM | POA: Diagnosis not present

## 2021-05-03 DIAGNOSIS — I5042 Chronic combined systolic (congestive) and diastolic (congestive) heart failure: Secondary | ICD-10-CM | POA: Diagnosis not present

## 2021-05-03 DIAGNOSIS — I5041 Acute combined systolic (congestive) and diastolic (congestive) heart failure: Secondary | ICD-10-CM | POA: Diagnosis not present

## 2021-05-03 DIAGNOSIS — I5021 Acute systolic (congestive) heart failure: Secondary | ICD-10-CM | POA: Diagnosis not present

## 2021-05-03 DIAGNOSIS — E440 Moderate protein-calorie malnutrition: Secondary | ICD-10-CM | POA: Diagnosis not present

## 2021-05-03 DIAGNOSIS — Z48812 Encounter for surgical aftercare following surgery on the circulatory system: Secondary | ICD-10-CM | POA: Diagnosis not present

## 2021-05-08 DIAGNOSIS — M150 Primary generalized (osteo)arthritis: Secondary | ICD-10-CM | POA: Diagnosis not present

## 2021-05-08 DIAGNOSIS — I5021 Acute systolic (congestive) heart failure: Secondary | ICD-10-CM | POA: Diagnosis not present

## 2021-05-08 DIAGNOSIS — R269 Unspecified abnormalities of gait and mobility: Secondary | ICD-10-CM | POA: Diagnosis not present

## 2021-05-09 ENCOUNTER — Telehealth: Payer: Self-pay

## 2021-05-09 DIAGNOSIS — I10 Essential (primary) hypertension: Secondary | ICD-10-CM | POA: Diagnosis not present

## 2021-05-09 DIAGNOSIS — N390 Urinary tract infection, site not specified: Secondary | ICD-10-CM | POA: Diagnosis not present

## 2021-05-09 NOTE — Telephone Encounter (Signed)
Patient  daughter called, stated that patient's ankles and feet are swelling more than usual. She has been taking her torsemide as directed. She has an appointment on the 15th to see you as well.  Also, she said they missed a call from you yesterday and wanted to know what it was in reference to. Please advise.   Mateo Flow (patients daughter) [phone]

## 2021-05-09 NOTE — Telephone Encounter (Signed)
I did not call yesterday. Please check if front desk called.  Recommend taking torsemide twice daily until her visit with me on 7/15.  Thanks MJP

## 2021-05-09 NOTE — Telephone Encounter (Signed)
Called daughter back, she will have patient increase Torsemide 2x a day until the appointment.

## 2021-05-10 DIAGNOSIS — I5041 Acute combined systolic (congestive) and diastolic (congestive) heart failure: Secondary | ICD-10-CM | POA: Diagnosis not present

## 2021-05-10 DIAGNOSIS — E440 Moderate protein-calorie malnutrition: Secondary | ICD-10-CM | POA: Diagnosis not present

## 2021-05-10 DIAGNOSIS — N183 Chronic kidney disease, stage 3 unspecified: Secondary | ICD-10-CM | POA: Diagnosis not present

## 2021-05-10 DIAGNOSIS — Z48812 Encounter for surgical aftercare following surgery on the circulatory system: Secondary | ICD-10-CM | POA: Diagnosis not present

## 2021-05-10 DIAGNOSIS — I5042 Chronic combined systolic (congestive) and diastolic (congestive) heart failure: Secondary | ICD-10-CM | POA: Diagnosis not present

## 2021-05-10 DIAGNOSIS — I130 Hypertensive heart and chronic kidney disease with heart failure and stage 1 through stage 4 chronic kidney disease, or unspecified chronic kidney disease: Secondary | ICD-10-CM | POA: Diagnosis not present

## 2021-05-11 DIAGNOSIS — K219 Gastro-esophageal reflux disease without esophagitis: Secondary | ICD-10-CM | POA: Diagnosis not present

## 2021-05-11 DIAGNOSIS — R1031 Right lower quadrant pain: Secondary | ICD-10-CM | POA: Diagnosis not present

## 2021-05-15 DIAGNOSIS — E440 Moderate protein-calorie malnutrition: Secondary | ICD-10-CM | POA: Diagnosis not present

## 2021-05-15 DIAGNOSIS — Z48812 Encounter for surgical aftercare following surgery on the circulatory system: Secondary | ICD-10-CM | POA: Diagnosis not present

## 2021-05-15 DIAGNOSIS — I5041 Acute combined systolic (congestive) and diastolic (congestive) heart failure: Secondary | ICD-10-CM | POA: Diagnosis not present

## 2021-05-15 DIAGNOSIS — N183 Chronic kidney disease, stage 3 unspecified: Secondary | ICD-10-CM | POA: Diagnosis not present

## 2021-05-15 DIAGNOSIS — I130 Hypertensive heart and chronic kidney disease with heart failure and stage 1 through stage 4 chronic kidney disease, or unspecified chronic kidney disease: Secondary | ICD-10-CM | POA: Diagnosis not present

## 2021-05-15 DIAGNOSIS — I5042 Chronic combined systolic (congestive) and diastolic (congestive) heart failure: Secondary | ICD-10-CM | POA: Diagnosis not present

## 2021-05-16 ENCOUNTER — Telehealth: Payer: Self-pay

## 2021-05-16 NOTE — Telephone Encounter (Signed)
Called patient, spoke with daughter Mateo Flow, they cannot find the wand for transmitter. They are going to look somewhere else. Then they will transmit. If, they do not find it, she will call me back to let me know, so I can order her a new one.

## 2021-05-18 ENCOUNTER — Encounter: Payer: Self-pay | Admitting: Cardiology

## 2021-05-18 ENCOUNTER — Other Ambulatory Visit: Payer: Self-pay

## 2021-05-18 ENCOUNTER — Ambulatory Visit: Payer: Medicare Other | Admitting: Cardiology

## 2021-05-18 VITALS — BP 110/68 | HR 60 | Temp 98.0°F | Resp 16 | Ht 64.0 in | Wt 122.0 lb

## 2021-05-18 DIAGNOSIS — I5022 Chronic systolic (congestive) heart failure: Secondary | ICD-10-CM | POA: Diagnosis not present

## 2021-05-18 DIAGNOSIS — I480 Paroxysmal atrial fibrillation: Secondary | ICD-10-CM

## 2021-05-18 DIAGNOSIS — N183 Chronic kidney disease, stage 3 unspecified: Secondary | ICD-10-CM | POA: Diagnosis not present

## 2021-05-18 DIAGNOSIS — I5041 Acute combined systolic (congestive) and diastolic (congestive) heart failure: Secondary | ICD-10-CM | POA: Diagnosis not present

## 2021-05-18 DIAGNOSIS — Z48812 Encounter for surgical aftercare following surgery on the circulatory system: Secondary | ICD-10-CM | POA: Diagnosis not present

## 2021-05-18 DIAGNOSIS — I5042 Chronic combined systolic (congestive) and diastolic (congestive) heart failure: Secondary | ICD-10-CM | POA: Diagnosis not present

## 2021-05-18 DIAGNOSIS — I130 Hypertensive heart and chronic kidney disease with heart failure and stage 1 through stage 4 chronic kidney disease, or unspecified chronic kidney disease: Secondary | ICD-10-CM | POA: Diagnosis not present

## 2021-05-18 DIAGNOSIS — E440 Moderate protein-calorie malnutrition: Secondary | ICD-10-CM | POA: Diagnosis not present

## 2021-05-18 NOTE — Progress Notes (Addendum)
Subjective:   Holly [name], female    DOB: [birth year], 80 y.o.   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  She has soreness all over her body, including her arms, shoulder joint, as well as lower ribs.  She " just does not feel good".  She denies any worsening of exertional dyspnea.  Leg edema stable.  She does not have any ongoing chest pain.  Recently, her torsemide was increased to 20 mg twice daily.  However, she noticed hypertension, therefore was reduced back to 10 mg daily.  Of note, patient turns 80 today.  Current Outpatient Medications on File Prior to Visit  Medication Sig Dispense Refill   ALPRAZolam (XANAX) 0.25 MG tablet Take 1 tablet by mouth daily as needed for anxiety.      apixaban (ELIQUIS) 2.5 MG TABS tablet Take 1 tablet (2.5 mg total) by mouth 2 (two) times daily. 60 tablet 6   Ascorbic Acid (VITAMIN C) 1000 MG tablet Take 1,000 mg by mouth daily.     Biotin 5000 MCG CAPS Take 1 capsule by mouth daily.     clobetasol ointment (TEMOVATE) 5.46 % Apply 1 application topically 2 (two) times daily as needed (itching).   1   clotrimazole-betamethasone (LOTRISONE) cream 1 application in the morning and at bedtime.     EUCRISA 2 % OINT Apply 1 application topically daily as needed (dry skin).      metoprolol succinate (TOPROL-XL) 50 MG 24 hr tablet Take 0.5 tablets (25 mg total) by mouth daily. Take with or immediately following a meal. 15 tablet 3   nitroGLYCERIN (NITROSTAT) 0.4 MG SL tablet Place 1 tablet (0.4 mg total) under the tongue every 5 (five) minutes as needed for chest pain. 90 tablet 3   OLOPATADINE HCL OP Place 1 drop into both eyes 2 (two) times daily.      rosuvastatin (CRESTOR) 10 MG tablet TAKE 1 TABLET BY MOUTH EVERY DAY (Patient taking differently: Take 10 mg by mouth every evening.) 90 tablet 2   torsemide (DEMADEX) 20 MG tablet Daily (Patient taking differently: Take 20 mg by mouth daily.) 90 tablet 1   triamcinolone cream (KENALOG) 0.1 % Apply topically 2 (two) times daily as needed.     Vitamin D, Ergocalciferol, 50 MCG (2000 UT) CAPS Take 1 capsule by mouth daily.     No current facility-administered medications on file prior to visit.    Cardiovascular & other pertient studies:  BiV upgrade 04/05/2021: 1.  Left upper extremity venography.  2.  Existing dual-chamber pacemaker generator removal  3.  Coronary sinus lead addition  EKG 03/27/2021: AV dual paced rhythm  Echocardiogram 03/27/2021: 1. Left ventricular ejection fraction, by estimation, is <20%. The left  ventricle has severely decreased function. The left ventricle demonstrates  global hypokinesis. There is mild left ventricular hypertrophy. Left  ventricular diastolic parameters are  indeterminate.   2. Right ventricular systolic function is normal. The right ventricular  size is normal.   3. Left atrial size was severely dilated.   4. Right atrial size was severely dilated.   5. The mitral valve is normal in structure. Severe mitral valve  regurgitation. No evidence of mitral  stenosis.   6. Tricuspid valve regurgitation is severe.   7. The aortic valve is normal in structure. There is mild calcification  of the aortic valve. There is mild thickening of the aortic valve. Aortic  valve regurgitation is trivial. Mild to moderate aortic valve  sclerosis/calcification is present, without  any evidence of aortic stenosis.   8. The inferior vena cava is dilated in size with <50% respiratory  variability, suggesting right atrial pressure of 15 mmHg.  Lexiscan myoview stress test 08/04/2017:  1. Non-diagnostic due to paced rhythm. 2. Study quality: good. The left  ventricular chamber dimensions are normal. There is medium scar of the inferolateral segment with moderate to severe residual ischemia. There is small scar of the inferior segment with very mild residual ischemia. 3. Systolic function is mildly reduced. The calculated stress EF is at 44 %. Gated SPECT imaging demonstrates hypokinesis in the inferior, lateral segment(s). 5. Intermediate risk study. Clinical correlation recommended.   Coronary angiogram 11/11/2017: LM: Normal LAD: D1 40% stneoses LCx: Tandem 50% stenoses in mid LCx RCA: Normal   Moderate coronary artery disease. She does have abnormal stress test in inferolateral territory, they are out of proportion to the coronary artery findings. Furthermore, she does not have any critical unstable lesion, to explain nonsustained ventricular tachycardia. She does not have any ongoing chest pain symptoms at this time.Recommend aggressive medical management at this time. If true anginal symptoms occur, could then perform PCI to mid circumflex.   Pacemaker Implant 07/16/2017:  Medtronic Azure XT DR MRI Sure Scan (serial number [id number]) dual chamber pacemaker 07/16/2017 by [facility] for complete heart block.    Recent labs: 04/08/2021: Glucose 94, BUN/Cr 39/1.76. EGFR 29. Na/K 132/4.6.  H/H 10/31. MCV 87. Platelets 180  10/2019: Chol 145, TG 49, HDL 76, LDL 58   Review of Systems  Constitutional: Negative for malaise/fatigue.  Cardiovascular:  Negative for chest pain, dyspnea on exertion, leg swelling, palpitations and syncope.  Skin:  Positive for itching.       Itching over PPM site         Vitals:   05/18/21 1105  BP: 110/68  Pulse: 60  Resp: 16  Temp: 98 F (36.7 C)  SpO2: 97%    Body mass index is 20.94 kg/m. Filed Weights   05/18/21 1105  Weight: 122 lb (55.3 kg)     Objective:   Physical Exam Vitals and nursing note reviewed.  Constitutional:      General: She is not in acute distress. Neck:     Vascular: No  JVD.  Cardiovascular:     Rate and Rhythm: Normal rate and regular rhythm.     Heart sounds: Normal heart sounds. No murmur heard. Pulmonary:     Effort: Pulmonary effort is normal.     Breath sounds: Normal breath sounds. No wheezing or rales.  Musculoskeletal:     Right lower leg: Edema (Trace) present.     Left lower leg: Edema (Trace) present.      Assessment & Recommendations:   80 year old African American female with hypertension, mod CAD, complete AV block, dual chamber PPM (2018) with RV pacing cardiomyopathy requiring CRT-P (12/9560)  Chronic systolic heart failure: Onlyl mild fluid overload today. LVEF <20% (03/2021). I am optimistic that this would improve now that she has undergone BiV upgrade. Continue metoprolol succinate 25 mg daily. Not on ARBI/MRA due to renal dysfunction and low normal blood pressure. Continue torsemide 20 mg PO daily.     PAF:  Continue eliquis 2.5 mg bid   Generalized pain: Likely related to arthritis. F/u w/PCP.  F/u in 3 months   Nigel Mormon, MD Pager: [phone] Office: [phone]

## 2021-05-22 DIAGNOSIS — I5042 Chronic combined systolic (congestive) and diastolic (congestive) heart failure: Secondary | ICD-10-CM | POA: Diagnosis not present

## 2021-05-22 DIAGNOSIS — I130 Hypertensive heart and chronic kidney disease with heart failure and stage 1 through stage 4 chronic kidney disease, or unspecified chronic kidney disease: Secondary | ICD-10-CM | POA: Diagnosis not present

## 2021-05-22 DIAGNOSIS — N183 Chronic kidney disease, stage 3 unspecified: Secondary | ICD-10-CM | POA: Diagnosis not present

## 2021-05-22 DIAGNOSIS — Z48812 Encounter for surgical aftercare following surgery on the circulatory system: Secondary | ICD-10-CM | POA: Diagnosis not present

## 2021-05-22 DIAGNOSIS — E440 Moderate protein-calorie malnutrition: Secondary | ICD-10-CM | POA: Diagnosis not present

## 2021-05-22 DIAGNOSIS — I5041 Acute combined systolic (congestive) and diastolic (congestive) heart failure: Secondary | ICD-10-CM | POA: Diagnosis not present

## 2021-05-24 DIAGNOSIS — E440 Moderate protein-calorie malnutrition: Secondary | ICD-10-CM | POA: Diagnosis not present

## 2021-05-24 DIAGNOSIS — Z48812 Encounter for surgical aftercare following surgery on the circulatory system: Secondary | ICD-10-CM | POA: Diagnosis not present

## 2021-05-24 DIAGNOSIS — I5042 Chronic combined systolic (congestive) and diastolic (congestive) heart failure: Secondary | ICD-10-CM | POA: Diagnosis not present

## 2021-05-24 DIAGNOSIS — N183 Chronic kidney disease, stage 3 unspecified: Secondary | ICD-10-CM | POA: Diagnosis not present

## 2021-05-24 DIAGNOSIS — I130 Hypertensive heart and chronic kidney disease with heart failure and stage 1 through stage 4 chronic kidney disease, or unspecified chronic kidney disease: Secondary | ICD-10-CM | POA: Diagnosis not present

## 2021-05-24 DIAGNOSIS — I5041 Acute combined systolic (congestive) and diastolic (congestive) heart failure: Secondary | ICD-10-CM | POA: Diagnosis not present

## 2021-05-25 ENCOUNTER — Telehealth: Payer: Self-pay | Admitting: Cardiology

## 2021-05-29 ENCOUNTER — Telehealth: Payer: Self-pay | Admitting: Cardiology

## 2021-05-29 ENCOUNTER — Telehealth: Payer: Self-pay

## 2021-05-29 DIAGNOSIS — E440 Moderate protein-calorie malnutrition: Secondary | ICD-10-CM | POA: Diagnosis not present

## 2021-05-29 DIAGNOSIS — I5041 Acute combined systolic (congestive) and diastolic (congestive) heart failure: Secondary | ICD-10-CM | POA: Diagnosis not present

## 2021-05-29 DIAGNOSIS — I130 Hypertensive heart and chronic kidney disease with heart failure and stage 1 through stage 4 chronic kidney disease, or unspecified chronic kidney disease: Secondary | ICD-10-CM | POA: Diagnosis not present

## 2021-05-29 DIAGNOSIS — N183 Chronic kidney disease, stage 3 unspecified: Secondary | ICD-10-CM | POA: Diagnosis not present

## 2021-05-29 DIAGNOSIS — Z48812 Encounter for surgical aftercare following surgery on the circulatory system: Secondary | ICD-10-CM | POA: Diagnosis not present

## 2021-05-29 DIAGNOSIS — I5042 Chronic combined systolic (congestive) and diastolic (congestive) heart failure: Secondary | ICD-10-CM | POA: Diagnosis not present

## 2021-05-29 NOTE — Telephone Encounter (Signed)
Called and spoke to pt, she is aware.

## 2021-05-29 NOTE — Telephone Encounter (Signed)
Pts daughter called and stated that the pt still has swelling in her feet. She stated that the pt was directed to take the torsemide twice daily, but it was causing her BP to drop. Her Bp was around 108/60 and 108/70. She stated that she has been holding the Pts torsemide but that because the Pts legs are swelling, she is not sure what to do. Please advise.

## 2021-05-29 NOTE — Telephone Encounter (Signed)
Pts daughter(Valerie) is calling because pt blood pressure has dropped and she has swelling in her ankles. Daughter is requesting a call back asap.

## 2021-05-29 NOTE — Telephone Encounter (Signed)
Please resume torsemide at 20 mg daily, with additional dose in the afternoon, if no improvement in leg swelling. Systolic blood pressure of >90 mmhg acceptable, as long patient does not have any significatn dizziness symptoms.  Thanks MJP

## 2021-05-29 NOTE — Telephone Encounter (Signed)
Pts Bp has been dropping and her legs are swelling. Daughters phone number is [phone].

## 2021-06-04 DIAGNOSIS — E782 Mixed hyperlipidemia: Secondary | ICD-10-CM | POA: Diagnosis not present

## 2021-06-05 ENCOUNTER — Encounter: Payer: Self-pay | Admitting: Cardiology

## 2021-06-05 DIAGNOSIS — I5043 Acute on chronic combined systolic (congestive) and diastolic (congestive) heart failure: Secondary | ICD-10-CM | POA: Diagnosis not present

## 2021-06-05 DIAGNOSIS — I130 Hypertensive heart and chronic kidney disease with heart failure and stage 1 through stage 4 chronic kidney disease, or unspecified chronic kidney disease: Secondary | ICD-10-CM | POA: Diagnosis not present

## 2021-06-05 DIAGNOSIS — E440 Moderate protein-calorie malnutrition: Secondary | ICD-10-CM | POA: Diagnosis not present

## 2021-06-05 DIAGNOSIS — I25118 Atherosclerotic heart disease of native coronary artery with other forms of angina pectoris: Secondary | ICD-10-CM | POA: Diagnosis not present

## 2021-06-05 DIAGNOSIS — N183 Chronic kidney disease, stage 3 unspecified: Secondary | ICD-10-CM | POA: Diagnosis not present

## 2021-06-05 DIAGNOSIS — Z48812 Encounter for surgical aftercare following surgery on the circulatory system: Secondary | ICD-10-CM | POA: Diagnosis not present

## 2021-06-05 NOTE — Telephone Encounter (Signed)
Spoke with patient daughter, I told her I would contact [employer] rep and find out the status on the new transmitter.

## 2021-06-06 NOTE — Telephone Encounter (Signed)
Called and left a message on daughters VM to call back about the orange light she sees on the transmitter and what it means.   What it means: Her pacemaker and the transmitter are communicating to let her know that the mouse is not charging. Since she lost hers and we currently have a new one on order, she does not have one right now, it is the reason for the orange light coming on, on the transmitter. So, if she calls back, please let her know.

## 2021-06-08 DIAGNOSIS — I5021 Acute systolic (congestive) heart failure: Secondary | ICD-10-CM | POA: Diagnosis not present

## 2021-06-08 DIAGNOSIS — R269 Unspecified abnormalities of gait and mobility: Secondary | ICD-10-CM | POA: Diagnosis not present

## 2021-06-08 DIAGNOSIS — M150 Primary generalized (osteo)arthritis: Secondary | ICD-10-CM | POA: Diagnosis not present

## 2021-06-10 ENCOUNTER — Telehealth: Payer: Self-pay | Admitting: Cardiology

## 2021-06-10 DIAGNOSIS — R0609 Other forms of dyspnea: Secondary | ICD-10-CM | POA: Diagnosis not present

## 2021-06-10 DIAGNOSIS — I5022 Chronic systolic (congestive) heart failure: Secondary | ICD-10-CM | POA: Diagnosis not present

## 2021-06-10 DIAGNOSIS — R0600 Dyspnea, unspecified: Secondary | ICD-10-CM

## 2021-06-10 NOTE — Telephone Encounter (Signed)
Chief Complaint  Patient presents with   Pacemaker Check    Yellow blink on transmitter   Shortness of Breath     Patient's daughter Mateo Flow page me this morning stating that her mom was complaining of shortness of breath this morning.  Also the ICD transmitter has been yellow for the past 2 days.  I walked her through this collection process from the electrical outlet and reconnected with signal remaining cream.  With regard to shortness of breath, again discussed regarding diet and to take 1 extra dose of torsemide today this afternoon and if symptoms persist to call us back.  I spent 6 minutes on the telephone with the patient's daughter, patient present at the bedside as well.  Clear-cut instructions given.    ICD-10-CM   1. Dyspnea on exertion  R06.00     2. Chronic systolic heart failure ([place])  I50.22         Adrian Prows, MD, [facility] 06/10/2021, 8:57 AM Office: [phone] Fax: [phone] Pager: [phone]

## 2021-06-11 ENCOUNTER — Telehealth: Payer: Self-pay | Admitting: Cardiology

## 2021-06-11 DIAGNOSIS — Z7689 Persons encountering health services in other specified circumstances: Secondary | ICD-10-CM | POA: Diagnosis not present

## 2021-06-11 DIAGNOSIS — I5041 Acute combined systolic (congestive) and diastolic (congestive) heart failure: Secondary | ICD-10-CM | POA: Diagnosis not present

## 2021-06-11 DIAGNOSIS — M159 Polyosteoarthritis, unspecified: Secondary | ICD-10-CM | POA: Diagnosis not present

## 2021-06-11 NOTE — Telephone Encounter (Signed)
Pt's daughter wanted to discuss matters regarding Pt's pacemaker w/a medical assistant, she had some questions/concerns.

## 2021-06-13 DIAGNOSIS — N183 Chronic kidney disease, stage 3 unspecified: Secondary | ICD-10-CM | POA: Diagnosis not present

## 2021-06-13 DIAGNOSIS — I25118 Atherosclerotic heart disease of native coronary artery with other forms of angina pectoris: Secondary | ICD-10-CM | POA: Diagnosis not present

## 2021-06-13 DIAGNOSIS — I5043 Acute on chronic combined systolic (congestive) and diastolic (congestive) heart failure: Secondary | ICD-10-CM | POA: Diagnosis not present

## 2021-06-13 DIAGNOSIS — I130 Hypertensive heart and chronic kidney disease with heart failure and stage 1 through stage 4 chronic kidney disease, or unspecified chronic kidney disease: Secondary | ICD-10-CM | POA: Diagnosis not present

## 2021-06-13 DIAGNOSIS — Z48812 Encounter for surgical aftercare following surgery on the circulatory system: Secondary | ICD-10-CM | POA: Diagnosis not present

## 2021-06-13 DIAGNOSIS — E440 Moderate protein-calorie malnutrition: Secondary | ICD-10-CM | POA: Diagnosis not present

## 2021-06-13 NOTE — Progress Notes (Signed)
Subjective:   Holly [name], female    DOB: 07-17-1941, 80 y.o.   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  Over the last few days, patient has had worsening shortness of breath at rest, and leg edema.  In addition, daughter is also noticed that patient is falling asleep easily during the daytime.  She has also noticed the patient "seeing" family members that are not present at that moment.  Reviewed recent labs performed by PCP office.  Current Outpatient Medications on File Prior to Visit  Medication Sig Dispense Refill   ALPRAZolam (XANAX) 0.25 MG tablet Take 1 tablet by mouth daily as needed for anxiety.      apixaban (ELIQUIS) 2.5 MG TABS tablet Take 1 tablet (2.5 mg total) by mouth 2 (two) times daily. 60 tablet 6   Ascorbic Acid (VITAMIN C) 1000 MG tablet Take 1,000 mg by mouth daily.     Biotin 5000 MCG CAPS Take 1 capsule by mouth daily.     clobetasol ointment (TEMOVATE) 4.92 % Apply 1 application topically 2 (two) times daily as needed (itching).   1   clotrimazole-betamethasone (LOTRISONE) cream 1 application in the morning and at bedtime.     EUCRISA 2 % OINT Apply 1 application topically daily as needed (dry skin).      losartan (COZAAR) 25 MG tablet Take 25 mg by mouth daily.     metoprolol succinate (TOPROL-XL) 50 MG 24 hr tablet Take 0.5 tablets (25 mg total) by mouth daily. Take with or immediately following a meal. 15 tablet 3   nitroGLYCERIN (NITROSTAT) 0.4 MG SL tablet Place 1 tablet (0.4 mg total) under the tongue every 5 (five) minutes as needed for chest pain. 90 tablet 3   OLOPATADINE HCL OP Place 1 drop into both eyes 2 (two) times daily.     pantoprazole (PROTONIX) 40 MG tablet  Take 40 mg by mouth daily.     rosuvastatin (CRESTOR) 10 MG tablet TAKE 1 TABLET BY MOUTH EVERY DAY (Patient taking differently: Take 10 mg by mouth every evening.) 90 tablet 2   torsemide (DEMADEX) 20 MG tablet Daily (Patient taking differently: Take 20 mg by mouth daily.) 90 tablet 1   triamcinolone cream (KENALOG) 0.1 % Apply topically 2 (two) times daily as needed.     Vitamin D, Ergocalciferol, 50 MCG (2000 UT) CAPS Take 1 capsule by mouth daily.     No current facility-administered medications on file prior to visit.    Cardiovascular & other pertient studies:  EKG 06/14/2021: Ventricular paced rhythm  BiV upgrade 04/05/2021: 1.  Left upper extremity venography.  2.  Existing dual-chamber pacemaker generator removal  3.  Coronary sinus lead addition  EKG 03/27/2021: AV dual paced rhythm  Echocardiogram 03/27/2021: 1. Left ventricular ejection fraction, by estimation, is <20%. The left  ventricle has severely decreased function. The left ventricle demonstrates  global hypokinesis. There is mild left ventricular hypertrophy. Left  ventricular diastolic parameters are  indeterminate.   2. Right ventricular systolic function is normal. The right ventricular  size is normal.   3. Left atrial size was severely dilated.   4. Right atrial size was severely dilated.   5. The mitral valve  is normal in structure. Severe mitral valve  regurgitation. No evidence of mitral stenosis.   6. Tricuspid valve regurgitation is severe.   7. The aortic valve is normal in structure. There is mild calcification  of the aortic valve. There is mild thickening of the aortic valve. Aortic  valve regurgitation is trivial. Mild to moderate aortic valve  sclerosis/calcification is present, without  any evidence of aortic stenosis.   8. The inferior vena cava is dilated in size with <50% respiratory  variability, suggesting right atrial pressure of 15 mmHg.  Lexiscan myoview stress test 08/04/2017:  1.  Non-diagnostic due to paced rhythm. 2. Study quality: good. The left ventricular chamber dimensions are normal. There is medium scar of the inferolateral segment with moderate to severe residual ischemia. There is small scar of the inferior segment with very mild residual ischemia. 3. Systolic function is mildly reduced. The calculated stress EF is at 44 %. Gated SPECT imaging demonstrates hypokinesis in the inferior, lateral segment(s). 5. Intermediate risk study. Clinical correlation recommended.   Coronary angiogram 11/11/2017: LM: Normal LAD: D1 40% stneoses LCx: Tandem 50% stenoses in mid LCx RCA: Normal   Moderate coronary artery disease. She does have abnormal stress test in inferolateral territory, they are out of proportion to the coronary artery findings. Furthermore, she does not have any critical unstable lesion, to explain nonsustained ventricular tachycardia. She does not have any ongoing chest pain symptoms at this time.Recommend aggressive medical management at this time. If true anginal symptoms occur, could then perform PCI to mid circumflex.   Pacemaker Implant 07/16/2017:  [employer] Azure XT DR MRI Sure Scan (serial number [id number]) dual chamber pacemaker 07/16/2017 by [facility] for complete heart block.    Recent labs: 06/04/2021: Glucose 94, BUN/Cr 39/1.7. EGFR 30. Chol 84, TG 51, HDL 43, LDL 64  04/08/2021: Glucose 94, BUN/Cr 39/1.76. EGFR 29. Na/K 132/4.6.  H/H 10/31. MCV 87. Platelets 180  10/2019: Chol 145, TG 49, HDL 76, LDL 58   Review of Systems  Constitutional: Positive for malaise/fatigue.  Cardiovascular:  Positive for dyspnea on exertion and leg swelling. Negative for chest pain, palpitations and syncope.  Respiratory:  Positive for shortness of breath.   Skin:           Psychiatric/Behavioral:  Positive for hallucinations.         Vitals:   06/14/21 1142  BP: 112/64  Pulse: 60  Resp: 16  Temp: 98 F (36.7 C)  SpO2: 98%    Body mass index is  21.28 kg/m. Filed Weights   06/14/21 1142  Weight: 124 lb (56.2 kg)     Objective:   Physical Exam Vitals and nursing note reviewed.  Constitutional:      General: She is not in acute distress. Neck:     Vascular: JVD present.  Cardiovascular:     Rate and Rhythm: Normal rate and regular rhythm.     Heart sounds: Normal heart sounds. No murmur heard. Pulmonary:     Effort: Tachypnea and respiratory distress (Mild. Normal oxygenation at rest) present.     Breath sounds: Normal breath sounds. No wheezing or rales.  Musculoskeletal:     Right lower leg: Edema (Trace) present.     Left lower leg: Edema (Trace) present.      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  I offered the patient hospitalization/ER visit.  Patient is unwilling to go to the hospital at this time. Continue metoprolol succinate 25 mg daily. Not on ARBI/MRA due to renal dysfunction and low normal blood pressure. I increased torsemide to 20 mg twice daily.  She will need close follow-up, arranged for next week.  In addition to her heart failure, I believe she may also have additional noncardiac acute problems given her somnolence, possible hallucinations.  I ordered urine analysis and chest x-ray.  She has follow-up with her PCP [name] on Monday, 06/18/2021.  In the meantime, should she have any worsening shortness of breath, loss of consciousness etc., I strongly recommend calling 911.  In addition, I also discussed patient's long-term wishes regarding her health care.  She has clearly had decline in her health over the past year or so.  Patient underwent CRT-P upgrade to salvage her longstanding RV pacing cardiomyopathy.  However, it  remains to be seen whether it feel better long-term improvement in her LV function or not.  I do not think patient is candidate for any further escalation of advanced heart failure therapy given her age and comorbidities.  I discussed continued medical therapy, as well as considering palliative care/hospice.  Patient is unsure about her advance directive or CODE STATUS wishes.  This will need continued discussion.  PAF: Continue eliquis 2.5 mg bid  No charge for EKG. Clinically, not required.   F/u in 1 week   Nigel Mormon, MD Pager: [phone] Office: [phone]

## 2021-06-14 ENCOUNTER — Encounter: Payer: Self-pay | Admitting: Cardiology

## 2021-06-14 ENCOUNTER — Other Ambulatory Visit: Payer: Self-pay

## 2021-06-14 ENCOUNTER — Ambulatory Visit: Payer: Medicare Other | Admitting: Cardiology

## 2021-06-14 VITALS — BP 112/64 | HR 60 | Temp 98.0°F | Resp 16 | Ht 64.0 in | Wt 124.0 lb

## 2021-06-14 DIAGNOSIS — I5023 Acute on chronic systolic (congestive) heart failure: Secondary | ICD-10-CM

## 2021-06-14 DIAGNOSIS — R0609 Other forms of dyspnea: Secondary | ICD-10-CM

## 2021-06-14 DIAGNOSIS — I5022 Chronic systolic (congestive) heart failure: Secondary | ICD-10-CM

## 2021-06-14 DIAGNOSIS — I480 Paroxysmal atrial fibrillation: Secondary | ICD-10-CM

## 2021-06-14 DIAGNOSIS — R0600 Dyspnea, unspecified: Secondary | ICD-10-CM

## 2021-06-14 DIAGNOSIS — R443 Hallucinations, unspecified: Secondary | ICD-10-CM | POA: Insufficient documentation

## 2021-06-15 ENCOUNTER — Ambulatory Visit
Admission: RE | Admit: 2021-06-15 | Discharge: 2021-06-15 | Disposition: A | Discharge status: Home / Self Care | Payer: Medicare Other | Source: Ambulatory Visit | Attending: Cardiology | Admitting: Cardiology

## 2021-06-15 DIAGNOSIS — R300 Dysuria: Secondary | ICD-10-CM | POA: Diagnosis not present

## 2021-06-15 DIAGNOSIS — I5023 Acute on chronic systolic (congestive) heart failure: Secondary | ICD-10-CM

## 2021-06-15 DIAGNOSIS — R0602 Shortness of breath: Secondary | ICD-10-CM | POA: Diagnosis not present

## 2021-06-15 DIAGNOSIS — R0600 Dyspnea, unspecified: Secondary | ICD-10-CM | POA: Diagnosis not present

## 2021-06-15 NOTE — Telephone Encounter (Signed)
This has been taken care of.

## 2021-06-16 LAB — CBC
Hematocrit: 37.1 % (ref 34.0–46.6)
Hemoglobin: 11.9 g/dL (ref 11.1–15.9)
MCH: 26.4 pg — ABNORMAL LOW (ref 26.6–33.0)
MCHC: 32.1 g/dL (ref 31.5–35.7)
MCV: 82 fL (ref 79–97)
Platelets: 202 10*3/uL (ref 150–450)
RBC: 4.5 x10E6/uL (ref 3.77–5.28)
RDW: 17.4 % — ABNORMAL HIGH (ref 11.7–15.4)
WBC: 4.3 10*3/uL (ref 3.4–10.8)

## 2021-06-16 LAB — COMPREHENSIVE METABOLIC PANEL
ALT: 15 IU/L (ref 0–32)
AST: 19 IU/L (ref 0–40)
Albumin/Globulin Ratio: 1.1 — ABNORMAL LOW (ref 1.2–2.2)
Albumin: 4.2 g/dL (ref 3.6–4.6)
Alkaline Phosphatase: 70 IU/L (ref 44–121)
BUN/Creatinine Ratio: 37 — ABNORMAL HIGH (ref 12–28)
BUN: 72 mg/dL — ABNORMAL HIGH (ref 8–27)
Bilirubin Total: 1 mg/dL (ref 0.0–1.2)
CO2: 16 mmol/L — ABNORMAL LOW (ref 20–29)
Calcium: 11 mg/dL — ABNORMAL HIGH (ref 8.7–10.3)
Chloride: 101 mmol/L (ref 96–106)
Creatinine, Ser: 1.94 mg/dL — ABNORMAL HIGH (ref 0.57–1.00)
Globulin, Total: 3.7 g/dL (ref 1.5–4.5)
Glucose: 112 mg/dL — ABNORMAL HIGH (ref 65–99)
Potassium: 4.5 mmol/L (ref 3.5–5.2)
Sodium: 139 mmol/L (ref 134–144)
Total Protein: 7.9 g/dL (ref 6.0–8.5)
eGFR: 26 mL/min/{1.73_m2} — ABNORMAL LOW (ref >59)

## 2021-06-16 LAB — URINALYSIS, ROUTINE W REFLEX MICROSCOPIC
Bilirubin, UA: NEGATIVE
Glucose, UA: NEGATIVE
Ketones, UA: NEGATIVE
Nitrite, UA: NEGATIVE
Specific Gravity, UA: 1.012 (ref 1.005–1.030)
Urobilinogen, Ur: 0.2 mg/dL (ref 0.2–1.0)
pH, UA: 7 (ref 5.0–7.5)

## 2021-06-16 LAB — MICROSCOPIC EXAMINATION

## 2021-06-16 LAB — BRAIN NATRIURETIC PEPTIDE: BNP: >5000 pg/mL — AB (ref 0.0–100.0)

## 2021-06-18 NOTE — Progress Notes (Signed)
Subjective:   Holly [name], female    DOB: Mar 03, 1941, 80 y.o.   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  Patient was seen in our office 5 days ago with acute on chronic systolic heart failure, discussed hospital admission for IV diuresis, however patient was resistant to hospitalization or evaluation in the ED, therefore increase torsemide to 20 mg twice daily.  BNP following last visit was >5000, urinalysis revealed concerns for UTI, and chest x-ray showed mild right suprahilar atelectasis versus infiltrate.   Patient now presents for close follow-up.  Symptoms of dyspnea and leg edema have not improved with increased torsemide and patient continues to have hallucinations per her daughter.  On exam today patient appears more volume overloaded than at visit 5 days ago with increased bilateral lower leg edema.  Patient's daughter also expresses concern about patient's new hand tremor.  Current Outpatient Medications on File Prior to Visit  Medication Sig Dispense Refill   ALPRAZolam (XANAX) 0.25 MG tablet Take 1 tablet by mouth daily as needed for anxiety.      apixaban (ELIQUIS) 2.5 MG TABS tablet Take 1 tablet (2.5 mg total) by mouth 2 (two) times daily. 60 tablet 6   Ascorbic Acid (VITAMIN C) 1000 MG tablet Take 1,000 mg by mouth daily.     Biotin 5000 MCG CAPS Take 1 capsule by mouth daily.     clobetasol ointment (TEMOVATE) 1.24 % Apply 1 application topically 2 (two) times daily as needed (itching).   1   clotrimazole-betamethasone (LOTRISONE) cream 1 application in the morning and at bedtime.     EUCRISA 2 % OINT Apply 1 application topically daily as needed (dry skin).      losartan (COZAAR) 25 MG tablet Take 25 mg by mouth daily.      metoprolol succinate (TOPROL-XL) 50 MG 24 hr tablet Take 0.5 tablets (25 mg total) by mouth daily. Take with or immediately following a meal. 15 tablet 3   nitroGLYCERIN (NITROSTAT) 0.4 MG SL tablet Place 1 tablet (0.4 mg total) under the tongue every 5 (five) minutes as needed for chest pain. 90 tablet 3   OLOPATADINE HCL OP Place 1 drop into both eyes 2 (two) times daily.     pantoprazole (PROTONIX) 40 MG tablet Take 40 mg by mouth daily.     rosuvastatin (CRESTOR) 10 MG tablet TAKE 1 TABLET BY MOUTH EVERY DAY (Patient taking differently: Take 10 mg by mouth every evening.) 90 tablet 2   torsemide (DEMADEX) 20 MG tablet Daily (Patient taking differently: Take 20 mg by mouth daily.) 90 tablet 1   triamcinolone cream (KENALOG) 0.1 % Apply topically 2 (two) times daily as needed.     Vitamin D, Ergocalciferol, 50 MCG (2000 UT) CAPS Take 1 capsule by mouth daily.     No current facility-administered medications on file prior to visit.    Cardiovascular & other pertient studies: Chest x-ray 06/15/2021: There is a multi lead AICD. Mild atelectasis and/or infiltrate is seen within the suprahilar region on the right. There is no evidence of a pleural effusion or pneumothorax. The cardiac silhouette is enlarged and unchanged in size. There is mild calcification of the aortic arch. There is levoscoliosis of the thoracic  spine with multilevel degenerative changes. A chronic deformity of the left humeral head is noted.  EKG 06/14/2021: Ventricular paced rhythm  BiV upgrade 04/05/2021: 1.  Left upper extremity venography.  2.  Existing dual-chamber pacemaker generator removal  3.  Coronary sinus lead addition  EKG 03/27/2021: AV dual paced rhythm  Echocardiogram 03/27/2021: 1. Left ventricular ejection fraction, by estimation, is <20%. The left  ventricle has severely decreased function. The left ventricle demonstrates  global hypokinesis. There is mild left ventricular hypertrophy. Left   ventricular diastolic parameters are  indeterminate.   2. Right ventricular systolic function is normal. The right ventricular  size is normal.   3. Left atrial size was severely dilated.   4. Right atrial size was severely dilated.   5. The mitral valve is normal in structure. Severe mitral valve  regurgitation. No evidence of mitral stenosis.   6. Tricuspid valve regurgitation is severe.   7. The aortic valve is normal in structure. There is mild calcification  of the aortic valve. There is mild thickening of the aortic valve. Aortic  valve regurgitation is trivial. Mild to moderate aortic valve  sclerosis/calcification is present, without  any evidence of aortic stenosis.   8. The inferior vena cava is dilated in size with <50% respiratory  variability, suggesting right atrial pressure of 15 mmHg.  Lexiscan myoview stress test 08/04/2017:  1. Non-diagnostic due to paced rhythm. 2. Study quality: good. The left ventricular chamber dimensions are normal. There is medium scar of the inferolateral segment with moderate to severe residual ischemia. There is small scar of the inferior segment with very mild residual ischemia. 3. Systolic function is mildly reduced. The calculated stress EF is at 44 %. Gated SPECT imaging demonstrates hypokinesis in the inferior, lateral segment(s). 5. Intermediate risk study. Clinical correlation recommended.   Coronary angiogram 11/11/2017: LM: Normal LAD: D1 40% stneoses LCx: Tandem 50% stenoses in mid LCx RCA: Normal   Moderate coronary artery disease. She does have abnormal stress test in inferolateral territory, they are out of proportion to the coronary artery findings. Furthermore, she does not have any critical unstable lesion, to explain nonsustained ventricular tachycardia. She does not have any ongoing chest pain symptoms at this time.Recommend aggressive medical management at this time. If true anginal symptoms occur, could then perform PCI to mid  circumflex.   Pacemaker Implant 07/16/2017:  [employer] Azure XT DR MRI Sure Scan (serial number [id number]) dual chamber pacemaker 07/16/2017 by [facility] for complete heart block.    Recent labs: 06/15/2021: BNP >5000 Glucose 112, BUN 72, creatinine 1.94, GFR 26, sodium 139, potassium 4.5 Hgb 11.9, HCT 37.1, MCV 82, platelet 202 Urinalysis positive for bacteria (many)  06/04/2021: Glucose 94, BUN/Cr 39/1.7. EGFR 30. Chol 84, TG 51, HDL 43, LDL 64  04/08/2021: Glucose 94, BUN/Cr 39/1.76. EGFR 29. Na/K 132/4.6.  H/H 10/31. MCV 87. Platelets 180  10/2019: Chol 145, TG 49, HDL 76, LDL 58   Review of Systems  Constitutional: Positive for malaise/fatigue.  Cardiovascular:  Positive for dyspnea on exertion and leg swelling (worsened). Negative for chest pain, palpitations and syncope.  Respiratory:  Positive for shortness of breath.   Skin:           Psychiatric/Behavioral:  Positive for hallucinations.         Vitals:   06/19/21 1131 06/19/21 1141  BP: 98/61 (!) 115/44  Pulse: 61 60  Resp: 17   Temp: (!) 97.3 F (36.3 C)   SpO2: 94% 97%  Body mass index is 21.63 kg/m. Filed Weights   06/19/21 1131  Weight: 126 lb (57.2 kg)     Objective:   Physical Exam Vitals and nursing note reviewed.  Constitutional:      Comments: Appears fatigued  Neck:     Vascular: JVD present.  Cardiovascular:     Rate and Rhythm: Normal rate and regular rhythm.     Heart sounds: Normal heart sounds. No murmur heard. Pulmonary:     Effort: Tachypnea and respiratory distress (Mild. Normal oxygenation at rest) present.     Breath sounds: Normal breath sounds. No wheezing or rales.  Musculoskeletal:     Right lower leg: Edema (2+ pitting) present.     Left lower leg: Edema (2+ pitting) present.      Assessment & Recommendations:   80 year old African American female with hypertension, mod CAD, complete AV block, dual chamber PPM (2018) with RV pacing cardiomyopathy requiring CRT-P  (03/2021)  Acute on chronic systolic heart failure: Patient remains in acute decompensated failure with mild respiratory distress and worsening leg edema.  Discussed with patient that oral diuretic management will likely not suffice given significant volume overload, would recommend IV diuresis for treatment of present acute exacerbation.  Patient is willing to go to the hospital, and will be transported by her daughter to the emergency department.  Notably patient is not on ARB I/MRA due to renal dysfunction and low normal blood pressure.  Would recommend initiation of Lasix 40 mg IV twice daily. Will plan to continue to follow patient while she is in the hospital.   Given patient's chronic comorbid conditions as well as recent somnolence, hallucinations and urinalysis concerning for UTI would recommend admission to hospitalist service with our team as cardiology consult for management of heart failure.  Again discussed at today's visit regarding patient's long-term wishes.  Counseled patient that given her significant systolic dysfunction she will likely continue to have episodes of acute heart failure exacerbation requiring hospitalization.  Again recommended considering palliative care/hospice consult, which patient and her daughter report they are open to during hospitalization.  Would recommend palliative care consult.  Patient remains unsure regarding her wishes for advanced directives or CODE STATUS, encouraged patient and her family to have open discussions regarding these decisions given that she is likely not a candidate for further escalation of advanced heart failure therapy due to her age and chronic comorbidities.  Further recommendations and follow up pending hospital admission.   Patient was seen in collaboration with Dr. Virgina Jock. He also reviewed patient's chart and examined the patient. Dr. Virgina Jock is in agreement of the plan.    Alethia Berthold, PA-C 06/19/2021, 12:21  PM Office: [phone]

## 2021-06-19 ENCOUNTER — Ambulatory Visit: Payer: Medicare Other | Admitting: Student

## 2021-06-19 ENCOUNTER — Emergency Department (HOSPITAL_COMMUNITY): Payer: Medicare Other

## 2021-06-19 ENCOUNTER — Encounter: Payer: Self-pay | Admitting: Student

## 2021-06-19 ENCOUNTER — Encounter (HOSPITAL_COMMUNITY): Payer: Self-pay

## 2021-06-19 ENCOUNTER — Inpatient Hospital Stay (HOSPITAL_COMMUNITY)
Admission: EM | Admit: 2021-06-19 | Discharge: 2021-06-21 | DRG: 291 | Disposition: A | Discharge status: Home Health | Payer: Medicare Other | Attending: Internal Medicine | Admitting: Internal Medicine

## 2021-06-19 ENCOUNTER — Other Ambulatory Visit: Payer: Self-pay

## 2021-06-19 VITALS — BP 115/44 | HR 60 | Temp 97.3°F | Resp 17 | Ht 64.0 in | Wt 126.0 lb

## 2021-06-19 DIAGNOSIS — Z7901 Long term (current) use of anticoagulants: Secondary | ICD-10-CM

## 2021-06-19 DIAGNOSIS — Z20822 Contact with and (suspected) exposure to covid-19: Secondary | ICD-10-CM | POA: Diagnosis present

## 2021-06-19 DIAGNOSIS — I480 Paroxysmal atrial fibrillation: Secondary | ICD-10-CM | POA: Diagnosis present

## 2021-06-19 DIAGNOSIS — I130 Hypertensive heart and chronic kidney disease with heart failure and stage 1 through stage 4 chronic kidney disease, or unspecified chronic kidney disease: Secondary | ICD-10-CM | POA: Diagnosis not present

## 2021-06-19 DIAGNOSIS — R0602 Shortness of breath: Secondary | ICD-10-CM | POA: Diagnosis not present

## 2021-06-19 DIAGNOSIS — I5023 Acute on chronic systolic (congestive) heart failure: Secondary | ICD-10-CM

## 2021-06-19 DIAGNOSIS — Z8249 Family history of ischemic heart disease and other diseases of the circulatory system: Secondary | ICD-10-CM | POA: Diagnosis not present

## 2021-06-19 DIAGNOSIS — N184 Chronic kidney disease, stage 4 (severe): Secondary | ICD-10-CM | POA: Diagnosis not present

## 2021-06-19 DIAGNOSIS — R7989 Other specified abnormal findings of blood chemistry: Secondary | ICD-10-CM

## 2021-06-19 DIAGNOSIS — E876 Hypokalemia: Secondary | ICD-10-CM | POA: Diagnosis present

## 2021-06-19 DIAGNOSIS — Z95810 Presence of automatic (implantable) cardiac defibrillator: Secondary | ICD-10-CM | POA: Diagnosis not present

## 2021-06-19 DIAGNOSIS — R778 Other specified abnormalities of plasma proteins: Secondary | ICD-10-CM | POA: Diagnosis not present

## 2021-06-19 DIAGNOSIS — I429 Cardiomyopathy, unspecified: Secondary | ICD-10-CM | POA: Diagnosis present

## 2021-06-19 DIAGNOSIS — I509 Heart failure, unspecified: Secondary | ICD-10-CM | POA: Diagnosis not present

## 2021-06-19 DIAGNOSIS — Z888 Allergy status to other drugs, medicaments and biological substances status: Secondary | ICD-10-CM

## 2021-06-19 DIAGNOSIS — Z79899 Other long term (current) drug therapy: Secondary | ICD-10-CM

## 2021-06-19 DIAGNOSIS — G609 Hereditary and idiopathic neuropathy, unspecified: Secondary | ICD-10-CM | POA: Diagnosis present

## 2021-06-19 DIAGNOSIS — R600 Localized edema: Secondary | ICD-10-CM | POA: Diagnosis present

## 2021-06-19 DIAGNOSIS — Z950 Presence of cardiac pacemaker: Secondary | ICD-10-CM | POA: Diagnosis present

## 2021-06-19 DIAGNOSIS — I5020 Unspecified systolic (congestive) heart failure: Secondary | ICD-10-CM

## 2021-06-19 DIAGNOSIS — I442 Atrioventricular block, complete: Secondary | ICD-10-CM | POA: Diagnosis not present

## 2021-06-19 DIAGNOSIS — I2510 Atherosclerotic heart disease of native coronary artery without angina pectoris: Secondary | ICD-10-CM | POA: Diagnosis not present

## 2021-06-19 DIAGNOSIS — I10 Essential (primary) hypertension: Secondary | ICD-10-CM | POA: Diagnosis present

## 2021-06-19 DIAGNOSIS — M7989 Other specified soft tissue disorders: Secondary | ICD-10-CM | POA: Diagnosis not present

## 2021-06-19 DIAGNOSIS — I110 Hypertensive heart disease with heart failure: Secondary | ICD-10-CM | POA: Diagnosis not present

## 2021-06-19 DIAGNOSIS — R9431 Abnormal electrocardiogram [ECG] [EKG]: Secondary | ICD-10-CM

## 2021-06-19 DIAGNOSIS — I517 Cardiomegaly: Secondary | ICD-10-CM | POA: Diagnosis not present

## 2021-06-19 LAB — COMPREHENSIVE METABOLIC PANEL
ALT: 15 U/L (ref 0–44)
AST: 24 U/L (ref 15–41)
Albumin: 3.3 g/dL — ABNORMAL LOW (ref 3.5–5.0)
Alkaline Phosphatase: 58 U/L (ref 38–126)
Anion gap: 10 (ref 5–15)
BUN: 62 mg/dL — ABNORMAL HIGH (ref 8–23)
CO2: 25 mmol/L (ref 22–32)
Calcium: 10.1 mg/dL (ref 8.9–10.3)
Chloride: 102 mmol/L (ref 98–111)
Creatinine, Ser: 2.01 mg/dL — ABNORMAL HIGH (ref 0.44–1.00)
GFR, Estimated: 24 mL/min — ABNORMAL LOW (ref >60)
Glucose, Bld: 94 mg/dL (ref 70–99)
Potassium: 3.3 mmol/L — ABNORMAL LOW (ref 3.5–5.1)
Sodium: 137 mmol/L (ref 135–145)
Total Bilirubin: 1.4 mg/dL — ABNORMAL HIGH (ref 0.3–1.2)
Total Protein: 7.1 g/dL (ref 6.5–8.1)

## 2021-06-19 LAB — CBC WITH DIFFERENTIAL/PLATELET
Abs Immature Granulocytes: 0.02 10*3/uL (ref 0.00–0.07)
Basophils Absolute: 0.1 10*3/uL (ref 0.0–0.1)
Basophils Relative: 2 %
Eosinophils Absolute: 0.3 10*3/uL (ref 0.0–0.5)
Eosinophils Relative: 7 %
HCT: 35.5 % — ABNORMAL LOW (ref 36.0–46.0)
Hemoglobin: 11.1 g/dL — ABNORMAL LOW (ref 12.0–15.0)
Immature Granulocytes: 0 %
Lymphocytes Relative: 18 %
Lymphs Abs: 0.8 10*3/uL (ref 0.7–4.0)
MCH: 26.4 pg (ref 26.0–34.0)
MCHC: 31.3 g/dL (ref 30.0–36.0)
MCV: 84.3 fL (ref 80.0–100.0)
Monocytes Absolute: 0.8 10*3/uL (ref 0.1–1.0)
Monocytes Relative: 18 %
Neutro Abs: 2.6 10*3/uL (ref 1.7–7.7)
Neutrophils Relative %: 55 %
Platelets: 204 10*3/uL (ref 150–400)
RBC: 4.21 MIL/uL (ref 3.87–5.11)
RDW: 18.6 % — ABNORMAL HIGH (ref 11.5–15.5)
WBC: 4.6 10*3/uL (ref 4.0–10.5)
nRBC: 0 % (ref 0.0–0.2)

## 2021-06-19 LAB — TROPONIN I (HIGH SENSITIVITY)
Troponin I (High Sensitivity): 48 ng/L — ABNORMAL HIGH (ref <18)
Troponin I (High Sensitivity): 46 ng/L — ABNORMAL HIGH (ref <18)

## 2021-06-19 LAB — BRAIN NATRIURETIC PEPTIDE: B Natriuretic Peptide: >4500 pg/mL — ABNORMAL HIGH (ref 0.0–100.0)

## 2021-06-19 MED ORDER — HYDROMORPHONE HCL 1 MG/ML IJ SOLN
INTRAMUSCULAR | Status: AC
  Filled 2021-06-19: qty 0.5

## 2021-06-19 MED ORDER — SODIUM CHLORIDE 0.9% FLUSH
3.0000 mL | Freq: Two times a day (BID) | INTRAVENOUS | Status: DC
  Administered 2021-06-20 (×2): 3 mL via INTRAVENOUS

## 2021-06-19 MED ORDER — NITROGLYCERIN 0.4 MG SL SUBL: 0.4000 mg | SUBLINGUAL_TABLET | SUBLINGUAL | Status: DC | PRN

## 2021-06-19 MED ORDER — ACETAMINOPHEN 325 MG PO TABS
650.0000 mg | ORAL_TABLET | Freq: Four times a day (QID) | ORAL | Status: DC | PRN
  Administered 2021-06-20: 650 mg via ORAL
  Filled 2021-06-19: qty 2

## 2021-06-19 MED ORDER — FUROSEMIDE 10 MG/ML IJ SOLN
40.0000 mg | Freq: Once | INTRAMUSCULAR | Status: AC
  Administered 2021-06-20: 40 mg via INTRAVENOUS
  Filled 2021-06-19: qty 4

## 2021-06-19 MED ORDER — APIXABAN 2.5 MG PO TABS
2.5000 mg | ORAL_TABLET | Freq: Two times a day (BID) | ORAL | Status: DC
  Administered 2021-06-20 – 2021-06-21 (×4): 2.5 mg via ORAL
  Filled 2021-06-19 (×4): qty 1

## 2021-06-19 MED ORDER — SODIUM CHLORIDE 0.9 % IV SOLN: 250.0000 mL | INTRAVENOUS | Status: DC | PRN

## 2021-06-19 MED ORDER — SODIUM CHLORIDE 0.9% FLUSH
3.0000 mL | INTRAVENOUS | Status: DC | PRN
  Administered 2021-06-20: 3 mL via INTRAVENOUS

## 2021-06-19 MED ORDER — LOSARTAN POTASSIUM 25 MG PO TABS
25.0000 mg | ORAL_TABLET | Freq: Every day | ORAL | Status: DC
  Filled 2021-06-19: qty 1

## 2021-06-19 MED ORDER — ROSUVASTATIN CALCIUM 5 MG PO TABS
10.0000 mg | ORAL_TABLET | Freq: Every evening | ORAL | Status: DC
  Administered 2021-06-20: 10 mg via ORAL
  Filled 2021-06-19: qty 2

## 2021-06-19 MED ORDER — ACETAMINOPHEN 650 MG RE SUPP: 650.0000 mg | Freq: Four times a day (QID) | RECTAL | Status: DC | PRN

## 2021-06-19 MED ORDER — SENNOSIDES-DOCUSATE SODIUM 8.6-50 MG PO TABS: 1.0000 | ORAL_TABLET | Freq: Every evening | ORAL | Status: DC | PRN

## 2021-06-19 MED ORDER — POTASSIUM CHLORIDE CRYS ER 20 MEQ PO TBCR
40.0000 meq | EXTENDED_RELEASE_TABLET | Freq: Once | ORAL | Status: AC
  Administered 2021-06-20: 40 meq via ORAL
  Filled 2021-06-19: qty 2

## 2021-06-19 MED ORDER — FUROSEMIDE 10 MG/ML IJ SOLN
40.0000 mg | Freq: Two times a day (BID) | INTRAMUSCULAR | Status: AC
Start: 2021-06-20 — End: 2021-06-21
  Administered 2021-06-20 – 2021-06-21 (×3): 40 mg via INTRAVENOUS
  Filled 2021-06-19 (×3): qty 4

## 2021-06-19 MED ORDER — METOPROLOL SUCCINATE ER 25 MG PO TB24
25.0000 mg | ORAL_TABLET | Freq: Every day | ORAL | Status: DC
  Administered 2021-06-20 – 2021-06-21 (×2): 25 mg via ORAL
  Filled 2021-06-19 (×3): qty 1

## 2021-06-19 NOTE — ED Provider Notes (Signed)
Emergency Medicine Provider Triage Evaluation Note  Holly [name] , a 80 y.o. female  was evaluated in triage.  Pt complains of fluid in legs  Pt reports her MD sent her in   Review of Systems  Positive: Leg swelling Negative: No shortness of breath   Physical Exam  There were no vitals taken for this visit. Gen:   Awake, no distress   Resp:  Normal effort  MSK:   Moves extremities without difficulty  Other:    Medical Decision Making  Medically screening exam initiated at 2:48 PM.  Appropriate orders placed.  Trinidad Curet Bosko was informed that the remainder of the evaluation will be completed by another provider, this initial triage assessment does not replace that evaluation, and the importance of remaining in the ED until their evaluation is complete.     Fransico Meadow, [place] 06/19/21 1449    Blanchie Dessert, MD 06/20/21 [phone]

## 2021-06-19 NOTE — ED Notes (Signed)
Daughter requests phone call at [phone], please. Concerned about her mother.

## 2021-06-19 NOTE — ED Notes (Signed)
Pt now in room.   

## 2021-06-19 NOTE — ED Provider Notes (Signed)
[facility] EMERGENCY DEPARTMENT Provider Note   CSN: [id number] Arrival date & time: 06/19/21  1434     History Chief Complaint  Patient presents with   Congestive Heart Failure    Holly [name] is a 80 y.o. female presenting for evaluation of shortness of breath.  Patient states she has had increasing shortness of breath over the past week.  She has a history of heart failure, saw her cardiologist today.  They were concerned about acute on chronic heart failure.  She is taking furosemide as prescribed, in fact has recently doubled the dose without improvement of symptoms.  She continues to have increasing swelling and increasing shortness of breath, mostly orthopnea and dyspnea on exertion.  No shortness of breath at rest.  She is not requiring oxygen.  She denies fevers, chills, cough, nausea, vomiting, abdominal pain, urinary symptoms.  Additional history obtained per chart review.  Patient with a history of third-degree block with pacemaker, heart failure, htn. 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/2017   Bronchitis    HFrEF (heart failure with reduced ejection fraction) ([place]) 04/21/2020   Hypertension    ICD: CRTD Medtronic Device Crtp Percepta Quad Mri 04/23/2021 04/23/2021        Patient Active Problem List   Diagnosis Date Noted   Acute on chronic HFrEF (heart failure with reduced ejection fraction) ([place]) 06/19/2021   Hallucination 06/14/2021   Precordial pain 04/26/2021   Pacemaker: CRT-P Medtronic Device Crtp Percepta Quad Mri 04/23/2021 04/23/2021   Weakness generalized    Palliative care by specialist    Malnutrition of moderate degree 03/29/2021   Cholelithiasis 03/27/2021   Congestive heart failure ([place]) 03/27/2021   AKI (acute kidney injury) ([place]) [id number]   Chronic systolic heart failure ([place]) 11/14/2020   Paroxysmal A-fib ([place]) 05/04/2020   HFrEF (heart  failure with reduced ejection fraction) ([place]) [id number]   Acute systolic heart failure ([place]) 04/10/2020   Nonrheumatic mitral valve regurgitation 04/10/2020   Elevated brain natriuretic peptide (BNP) level 03/30/2020   Leg edema 03/30/2020   Dyspnea on exertion 03/15/2020   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   NSVT (nonsustained ventricular tachycardia) ([place]) 09/23/2019   DNR (do not resuscitate) discussion 05/12/2019   Coronary artery disease of native artery of native heart with stable angina pectoris ([place]) 04/24/2019   Essential hypertension 04/24/2019   Abnormal stress test 11/08/2017   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;  Service: Cardiovascular;  Laterality: N/A;   LEFT HEART CATH AND CORONARY ANGIOGRAPHY N/A 11/11/2017   Procedure: LEFT HEART CATH AND CORONARY ANGIOGRAPHY;  Surgeon: Nigel Mormon, MD;  Location: [facility];  Service: Cardiovascular;  Laterality: N/A;   left leg surgery     PACEMAKER IMPLANT N/A 07/15/2017   Procedure: Pacemaker Implant;  Surgeon: Constance Haw, MD;  Location: [facility];  Service: Cardiovascular;  Laterality: N/A;   UPPER EXTREMITY VENOGRAPHY N/A 04/05/2021   Procedure: UPPER EXTREMITY VENOGRAPHY;  Surgeon: Vickie Epley, MD;  Location: [facility];  Service: Cardiovascular;  Laterality: N/A;  OB History   No obstetric history on file.     Family History  Problem Relation Age of Onset   Hypertension Mother    ALS Sister    Heart attack Brother    Diabetes Daughter     Social History   Tobacco Use   Smoking status: Never   Smokeless tobacco: Never  Vaping Use   Vaping  Use: Never used  Substance Use Topics   Alcohol use: No   Drug use: Never    Home Medications Prior to Admission medications   Medication Sig Start Date End Date Taking? Authorizing Provider  ALPRAZolam Duanne Moron) 0.25 MG tablet Take 1 tablet by mouth daily as needed for anxiety.  01/14/19   [provider]  apixaban (ELIQUIS) 2.5 MG TABS tablet Take 1 tablet (2.5 mg total) by mouth 2 (two) times daily. 04/10/21   Charlynne Cousins, MD  Ascorbic Acid (VITAMIN C) 1000 MG tablet Take 1,000 mg by mouth daily.    [provider]  Biotin 5000 MCG CAPS Take 1 capsule by mouth daily.    [provider]  clobetasol ointment (TEMOVATE) [id number] % Apply 1 application topically 2 (two) times daily as needed (itching).  08/02/17   [provider]  clotrimazole-betamethasone (LOTRISONE) cream 1 application in the morning and at bedtime. 01/16/17   [provider]  EUCRISA 2 % OINT Apply 1 application topically daily as needed (dry skin).  01/14/19   [provider]  losartan (COZAAR) 25 MG tablet Take 25 mg by mouth daily. 04/28/21   [provider]  metoprolol succinate (TOPROL-XL) 50 MG 24 hr tablet Take 0.5 tablets (25 mg total) by mouth daily. Take with or immediately following a meal. 04/08/21   Charlynne Cousins, MD  nitroGLYCERIN (NITROSTAT) 0.4 MG SL tablet Place 1 tablet (0.4 mg total) under the tongue every 5 (five) minutes as needed for chest pain. 09/23/19 06/19/21  Patwardhan, Reynold Bowen, MD  OLOPATADINE HCL OP Place 1 drop into both eyes 2 (two) times daily.    [provider]  pantoprazole (PROTONIX) 40 MG tablet Take 40 mg by mouth daily. 04/27/21   [provider]  rosuvastatin (CRESTOR) 10 MG tablet TAKE 1 TABLET BY MOUTH EVERY DAY Patient taking differently: Take 10 mg by mouth every evening. 03/20/21   Patwardhan, Reynold Bowen, MD  torsemide (DEMADEX) 20 MG tablet Daily Patient taking differently: Take 20 mg by mouth daily.  02/02/21   Patwardhan, Reynold Bowen, MD  triamcinolone cream (KENALOG) 0.1 % Apply topically 2 (two) times daily as needed. 06/01/20   [provider]  Vitamin D, Ergocalciferol, 50 MCG (2000 UT) CAPS Take 1 capsule by mouth daily.    [provider]    Allergies    Entresto [sacubitril-valsartan], Lisinopril, and Venlafaxine  Review of Systems   Review of Systems  Respiratory:  Positive for shortness of breath.   Cardiovascular:  Positive for leg swelling.  All other systems reviewed and are negative.  Physical Exam Updated Vital Signs BP 116/76 (BP Location: Left Arm)   Pulse 61   Temp 98.6 F (37 C) (Oral)   Resp 18   SpO2 100%   Physical Exam Vitals and nursing note reviewed.  Constitutional:      General: She is not in acute distress.    Appearance: Normal appearance.     Comments: nontoxic  HENT:     Head: Normocephalic and atraumatic.  Eyes:     Conjunctiva/sclera: Conjunctivae normal.  Pupils: Pupils are equal, round, and reactive to light.  Cardiovascular:     Rate and Rhythm: Normal rate and regular rhythm.     Pulses: Normal pulses.  Pulmonary:     Effort: Pulmonary effort is normal. No respiratory distress.     Breath sounds: Normal breath sounds. No wheezing.     Comments: Speaking in full sentences.  Clear lung sounds in all fields. Abdominal:     General: There is no distension.     Palpations: Abdomen is soft. There is no mass.     Tenderness: There is no abdominal tenderness. There is no guarding or rebound.  Musculoskeletal:        General: Normal range of motion.     Cervical back: Normal range of motion and neck supple.     Right lower leg: Edema present.     Left lower leg: Edema present.     Comments: Pitting edema bilaterally  Skin:    General: Skin is warm and dry.     Capillary Refill: Capillary refill takes less than 2 seconds.  Neurological:     Mental Status: She is alert and oriented to person, place, and time.   Psychiatric:        Mood and Affect: Mood and affect normal.        Speech: Speech normal.        Behavior: Behavior normal.    ED Results / Procedures / Treatments   Labs (all labs ordered are listed, but only abnormal results are displayed) Labs Reviewed  CBC WITH DIFFERENTIAL/PLATELET - Abnormal; Notable for the following components:      Result Value   Hemoglobin 11.1 (*)    HCT 35.5 (*)    RDW 18.6 (*)    All other components within normal limits  COMPREHENSIVE METABOLIC PANEL - Abnormal; Notable for the following components:   Potassium 3.3 (*)    BUN 62 (*)    Creatinine, Ser 2.01 (*)    Albumin 3.3 (*)    Total Bilirubin 1.4 (*)    GFR, Estimated 24 (*)    All other components within normal limits  BRAIN NATRIURETIC PEPTIDE - Abnormal; Notable for the following components:   B Natriuretic Peptide >4,500.0 (*)    All other components within normal limits  TROPONIN I (HIGH SENSITIVITY) - Abnormal; Notable for the following components:   Troponin I (High Sensitivity) 48 (*)    All other components within normal limits  TROPONIN I (HIGH SENSITIVITY) - Abnormal; Notable for the following components:   Troponin I (High Sensitivity) 46 (*)    All other components within normal limits  RESP PANEL BY RT-PCR (FLU A&B, COVID) [id number]  URINALYSIS, ROUTINE W REFLEX MICROSCOPIC    EKG EKG Interpretation  Date/Time:  Tuesday June 19 2021 14:47:44 EDT Ventricular Rate:  65 PR Interval:  166 QRS Duration: 172 QT Interval:  494 QTC Calculation: 513 R Axis:   -69 Text Interpretation: Atrial-paced rhythm with occasional and consecutive Premature ventricular complexes Left axis deviation Non-specific intra-ventricular conduction block Minimal voltage criteria for LVH, may be normal variant ( Cornell product ) Abnormal ECG When compared to prior, now paced. No STEMI Confirmed by Antony Blackbird [id number]) on 06/19/2021 8:49:05 PM  Radiology DG Chest 1 View  Result Date:  06/19/2021 CLINICAL DATA:  Bilateral leg swelling. Shortness of breath. History of CHF. EXAM: CHEST  1 VIEW COMPARISON:  06/15/2021 FINDINGS: Pacer/AICD device. Midline trachea. Apical lordotic positioning. Moderate cardiomegaly. Atherosclerosis in the  transverse aorta. No pleural effusion or pneumothorax. Pulmonary interstitial prominence and indistinctness is mild. No lobar consolidation. Degenerative changes of the right shoulder. Degenerative and possibly postsurgical changes of the left shoulder. IMPRESSION: Cardiomegaly and mild congestive heart failure. Aortic Atherosclerosis (ICD10-I70.0). Electronically Signed   By: Abigail Miyamoto M.D.   On: 06/19/2021 15:48    Procedures Procedures   Medications Ordered in ED Medications  furosemide (LASIX) injection 40 mg (has no administration in time range)  potassium chloride SA (KLOR-CON) CR tablet 40 mEq (has no administration in time range)    ED Course  I have reviewed the triage vital signs and the nursing notes.  Pertinent labs & imaging results that were available during my care of the patient were reviewed by me and considered in my medical decision making (see chart for details).    MDM Rules/Calculators/A&P                           Patient presenting for evaluation of worsening shortness of breath.  Seen by cardiology today and an outpatient setting, recommended she come to the ER for admission and IV diuresis.  On evaluation, patient appears nontoxic.  She does have signs of fluid overload and that she has pitting edema bilaterally.  She is not requiring supplemental oxygen.  Labs obtained from triage interpreted by me, shows BNP of greater than 4500.  Troponin minimally elevated, likely due to demand/heart failure.  No significant change when repeated.  Patient without chest pain, doubt ACS.  Creatinine elevated, but similar to previous.  Chest x-ray viewed and independently interpreted by me, shows cardiomegaly and mild edema.  Will  start IV Lasix.  Potassium is slightly low at 3.3, such we will supplement with p.o. potassium.  Will call for admission.  Discussed with Dr. Tonie Griffith from [facility] hospitalist service, patient to be admitted  Final Clinical Impression(s) / ED Diagnoses Final diagnoses:  Acute on chronic heart failure, unspecified heart failure type [facility])    Rx / DC Orders ED Discharge Orders     None        Franchot Heidelberg, PA-C 06/19/21 2252    Tegeler, Gwenyth Allegra, MD 06/19/21 2354

## 2021-06-19 NOTE — ED Notes (Signed)
Pt here with CHF exacerbation. Was seen at PCP's today and they checked her labs and her BNP was elevated so they sent her here. Pt said she's not more SOA than usual, but daughter says she's been struggling more at home. Pt oriented to person, place and situation, but not time. Pt alert. Pt denies pain. Pt has +2 pitting edema to BLE.

## 2021-06-19 NOTE — ED Notes (Signed)
Attempted to place IV twice without success.

## 2021-06-19 NOTE — ED Notes (Signed)
IV team at bedside. Pt given food by daughter, ok'd by provider.

## 2021-06-19 NOTE — ED Notes (Signed)
I attempted to stick pt twice for an IV and another RN tried three times without success. IV team consulted.

## 2021-06-19 NOTE — H&P (Signed)
History and Physical    Holly [name] [id number] DOB: [birth year] DOA: 06/19/2021  PCP: Merrilee Seashore, MD   Patient coming from: Home  Chief Complaint: SOB, increased swelling  HPI: Holly [name] is a 80 y.o. female with medical history significant for HFrEF, HTN, s/p AICD who presented to ER from her cardiology office.  She was being seen for increased shortness of breath and swelling over the last 2 weeks.  She has not had any fevers chills chest pain or pressure.  She gets very short of breath with minimal exertion.  She reports last 2 weeks she has had to sit in a more inclined position and has increased from 2 pillows to 3 pillows when she is lying in bed.  She is not able to lay flat.  He has not had any syncope.  She states her legs have become more swollen than they normally are.  She reports she has been compliant with all of her medications.  She was sent by the cardiology office for admission and IV diuresis with Lasix.   ED Course: The emergency room she has been hemodynamically stable and was given first dose of Lasix.  Elevated BNP greater than 4500 and an elevated troponin of 48.  Lites are normal.  LFTs are normal.  Creatinine is 2.01 with a BUN of 62 is close to her baseline.  CBC is unremarkable.  Hospitalist service was asked to admit for further management  Review of Systems:  General: Denies fever, chills, weight loss, night sweats. Denies dizziness. Denies change in appetite HENT: Denies head trauma, headache, denies change in hearing, tinnitus. Denies nasal congestion or bleeding. Denies sore throat. Denies difficulty swallowing Eyes: Denies blurry vision, pain in eye, drainage.  Denies discoloration of eyes. Neck: Denies pain.  Denies swelling.  Denies pain with movement. Cardiovascular: Denies chest pain, palpitations. Reports increased edema. Reports orthopnea Respiratory: Reports shortness of breath. Denies cough.  Denies wheezing.  Denies sputum  production Gastrointestinal: Denies abdominal pain, swelling.  Denies nausea, vomiting, diarrhea.  Denies melena.  Denies hematemesis. Musculoskeletal: Denies limitation of movement.  Denies deformity or swelling.  Denies pain.  Denies arthralgias or myalgias. Genitourinary: Denies pelvic pain.  Denies urinary frequency or hesitancy.  Denies dysuria.  Skin: Denies rash.  Denies petechiae, purpura, ecchymosis. Neurological:Denies syncope.  Denies seizure activity.  Denies paresthesia.  Denies slurred speech, drooping face.  Denies visual change. Psychiatric: Denies depression, anxiety. Denies hallucinations.  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   Bronchitis    HFrEF (heart failure with reduced ejection fraction) ([place]) 04/21/2020   Hypertension    ICD: CRTD Medtronic Device Crtp Percepta Quad Mri 04/23/2021 04/23/2021        Past Surgical History:  Procedure Laterality Date   BIV UPGRADE N/A 04/05/2021   Procedure: BIV UPGRADE;  Surgeon: Vickie Epley, MD;  Location: [facility];  Service: Cardiovascular;  Laterality: N/A;   LEFT HEART CATH AND CORONARY ANGIOGRAPHY N/A 11/11/2017   Procedure: LEFT HEART CATH AND CORONARY ANGIOGRAPHY;  Surgeon: Nigel Mormon, MD;  Location: [facility];  Service: Cardiovascular;  Laterality: N/A;   left leg surgery     PACEMAKER IMPLANT N/A 07/15/2017   Procedure: Pacemaker Implant;  Surgeon: Constance Haw, MD;  Location: [facility];  Service: Cardiovascular;  Laterality: N/A;   UPPER EXTREMITY VENOGRAPHY N/A 04/05/2021   Procedure: UPPER EXTREMITY VENOGRAPHY;  Surgeon: Vickie Epley,  MD;  Location: [facility];  Service: Cardiovascular;  Laterality: N/A;    Social History  reports that she has never smoked. She has never used smokeless tobacco. She reports that she does not drink alcohol and does not use drugs.  Allergies  Allergen Reactions   Entresto  [Sacubitril-Valsartan] Itching, Rash and Cough   Lisinopril Cough   Venlafaxine Rash    Family History  Problem Relation Age of Onset   Hypertension Mother    ALS Sister    Heart attack Brother    Diabetes Daughter      Prior to Admission medications   Medication Sig Start Date End Date Taking? Authorizing Provider  ALPRAZolam Duanne Moron) 0.25 MG tablet Take 1 tablet by mouth daily as needed for anxiety.  01/14/19   [provider]  apixaban (ELIQUIS) 2.5 MG TABS tablet Take 1 tablet (2.5 mg total) by mouth 2 (two) times daily. 04/10/21   Charlynne Cousins, MD  Ascorbic Acid (VITAMIN C) 1000 MG tablet Take 1,000 mg by mouth daily.    [provider]  Biotin 5000 MCG CAPS Take 1 capsule by mouth daily.    [provider]  clobetasol ointment (TEMOVATE) [id number] % Apply 1 application topically 2 (two) times daily as needed (itching).  08/02/17   [provider]  clotrimazole-betamethasone (LOTRISONE) cream 1 application in the morning and at bedtime. 01/16/17   [provider]  EUCRISA 2 % OINT Apply 1 application topically daily as needed (dry skin).  01/14/19   [provider]  losartan (COZAAR) 25 MG tablet Take 25 mg by mouth daily. 04/28/21   [provider]  metoprolol succinate (TOPROL-XL) 50 MG 24 hr tablet Take 0.5 tablets (25 mg total) by mouth daily. Take with or immediately following a meal. 04/08/21   Charlynne Cousins, MD  nitroGLYCERIN (NITROSTAT) 0.4 MG SL tablet Place 1 tablet (0.4 mg total) under the tongue every 5 (five) minutes as needed for chest pain. 09/23/19 06/19/21  Patwardhan, Reynold Bowen, MD  OLOPATADINE HCL OP Place 1 drop into both eyes 2 (two) times daily.    [provider]  pantoprazole (PROTONIX) 40 MG tablet Take 40 mg by mouth daily. 04/27/21   [provider]  rosuvastatin (CRESTOR) 10 MG tablet TAKE 1 TABLET BY MOUTH EVERY DAY Patient taking differently: Take 10 mg by mouth every evening.  03/20/21   Patwardhan, Reynold Bowen, MD  torsemide (DEMADEX) 20 MG tablet Daily Patient taking differently: Take 20 mg by mouth daily. 02/02/21   Patwardhan, Reynold Bowen, MD  triamcinolone cream (KENALOG) 0.1 % Apply topically 2 (two) times daily as needed. 06/01/20   [provider]  Vitamin D, Ergocalciferol, 50 MCG (2000 UT) CAPS Take 1 capsule by mouth daily.    [provider]    Physical Exam: Vitals:   06/19/21 1448 06/19/21 1736 06/19/21 2038  BP: 106/63 (!) 104/58 116/76  Pulse: (!) 59 (!) 59 61  Resp: '14 15 18  '$ Temp: 98.1 F (36.7 C) 97.9 F (36.6 C) 98.6 F (37 C)  TempSrc:   Oral  SpO2: 99% 100% 100%    Constitutional: NAD, calm, comfortable Vitals:   06/19/21 1448 06/19/21 1736 06/19/21 2038  BP: 106/63 (!) 104/58 116/76  Pulse: (!) 59 (!) 59 61  Resp: '14 15 18  '$ Temp: 98.1 F (36.7 C) 97.9 F (36.6 C) 98.6 F (37 C)  TempSrc:   Oral  SpO2: 99% 100% 100%   General: WDWN, Alert  and oriented x3.  Eyes: EOMI, PERRL, conjunctivae normal.  Sclera nonicteric HENT:  [place]/AT, external ears normal.  Nares patent without epistasis.  Mucous membranes are moist. Posterior pharynx clear Neck: Soft, normal range of motion, supple, no masses, no thyromegaly.  Trachea midline Respiratory: Equal breath sounds with diffuse bilateral rales. no wheezing, no crackles. Normal respiratory effort. No accessory muscle use.  Cardiovascular: Regular rate and rhythm, 3/6 systolic murmur. No rubs / gallops. +2-3 lower extremity edema. trace pedal pulses. AICD in left upper chest Abdomen: Soft, no tenderness, nondistended, no rebound or guarding.  No masses palpated. Bowel sounds normoactive Musculoskeletal: FROM. no cyanosis. No joint deformity upper and lower extremities. Normal muscle tone.  Skin: Warm, dry, intact no rashes, lesions, ulcers. No induration. Capillary refill 2 seconds. Normal skin turgor Neurologic: CN 2-12 grossly intact.  Normal speech.  Sensation intact to touch.  Strength 4/5 in all extremities.   Psychiatric: Normal judgment and insight.  Normal mood.    Labs on Admission: I have personally reviewed following labs and imaging studies  CBC: Recent Labs  Lab 06/15/21 1205 06/19/21 1531  WBC 4.3 4.6  NEUTROABS  --  2.6  HGB 11.9 11.1*  HCT 37.1 35.5*  MCV 82 84.3  PLT 202 0000000    Basic Metabolic Panel: Recent Labs  Lab 06/15/21 1205 06/19/21 1531  NA 139 137  K 4.5 3.3*  CL 101 102  CO2 16* 25  GLUCOSE 112* 94  BUN 72* 62*  CREATININE 1.94* 2.01*  CALCIUM 11.0* 10.1    GFR: Estimated Creatinine Clearance: 19 mL/min (A) (by C-G formula based on SCr of 2.01 mg/dL (H)).  Liver Function Tests: Recent Labs  Lab 06/15/21 1205 06/19/21 1531  AST 19 24  ALT 15 15  ALKPHOS 70 58  BILITOT 1.0 1.4*  PROT 7.9 7.1  ALBUMIN 4.2 3.3*    Urine analysis:    Component Value Date/Time   COLORURINE YELLOW 03/26/2021 2304   APPEARANCEUR Cloudy (A) 06/15/2021 1205   LABSPEC 1.008 03/26/2021 2304   PHURINE 5.0 03/26/2021 2304   GLUCOSEU Negative 06/15/2021 1205   HGBUR NEGATIVE 03/26/2021 2304   BILIRUBINUR Negative 06/15/2021 [place] 03/26/2021 2304   PROTEINUR 1+ (A) 06/15/2021 1205   PROTEINUR NEGATIVE 03/26/2021 2304   UROBILINOGEN 0.2 07/11/2009 1942   NITRITE Negative 06/15/2021 1205   NITRITE NEGATIVE 03/26/2021 2304   LEUKOCYTESUR 2+ (A) 06/15/2021 1205   LEUKOCYTESUR NEGATIVE 03/26/2021 2304    Radiological Exams on Admission: DG Chest 1 View  Result Date: 06/19/2021 CLINICAL DATA:  Bilateral leg swelling. Shortness of breath. History of CHF. EXAM: CHEST  1 VIEW COMPARISON:  06/15/2021 FINDINGS: Pacer/AICD device. Midline trachea. Apical lordotic positioning. Moderate cardiomegaly. Atherosclerosis in the transverse aorta. No pleural effusion or pneumothorax. Pulmonary interstitial prominence and indistinctness is mild. No lobar consolidation. Degenerative changes of the right shoulder. Degenerative and  possibly postsurgical changes of the left shoulder. IMPRESSION: Cardiomegaly and mild congestive heart failure. Aortic Atherosclerosis (ICD10-I70.0). Electronically Signed   By: Abigail Miyamoto M.D.   On: 06/19/2021 15:48    EKG: Independently reviewed.  EKG shows atrial paced rhythm with occasional PVCs.  No acute ST elevation or depression.  QTC is prolonged at 513  Assessment/Plan Principal Problem:   Acute on chronic HFrEF (heart failure with reduced ejection fraction)  Ms. Brace is admitted to cardiac telemetry floor.  Diurese with Lasix 40 mg IV twice a day.  Monitor INO's.  Monitor daily weight. Patient echocardiogram  in May 2022 that showed an EF of less than 20% with decreased LV function with global hypokinesis and mild LVH.  She is followed by cardiology who will consult on patient in the morning.  We will wait to see if cardiology would like to repeat echocardiogram at this time.  Check serial troponin levels Patient is on beta-blocker and ARB therapy.  She could not tolerate Entresto in the past according to notes. Need to discuss with cardiology if patient is a candidate for Bill d[place] or an SLGT2 inhibitor such as Vania Rea which has been shown to decrease risk of CHF exacerbations and hospitalizations in studies.  Cardiology note from the office states that they may need to consider palliative care consult   Active Problems:   Essential hypertension Continue metoprolol and losartan.  Monitor blood pressure.    CKD (chronic kidney disease) stage 4, GFR 15-29 ml/min Stable.  Monitor electrolytes and renal functions with IV Lasix treatment    Leg edema Diurese with Lasix as above    Prolonged QT interval Avoid medications which could further prolong QT interval    Pacemaker: CRT-P Medtronic Device Crtp Percepta Quad Mri 04/23/2021 Stable    DVT prophylaxis: Ms. Candell is anticoagulated on Eliquis which will be continued Code Status:   Full code.  Status was discussed  with patient and her daughter who is at bedside and they both affirm full code status Family Communication:  Diagnosis and plan discussed with patient and her daughter who is at bedside.  They verbalized understanding and agree with plan.  Further recommendations to follow as clinically indicated Disposition Plan:   Patient is from:  Home  Anticipated DC to:  Home  Anticipated DC date:  Anticipate 2 midnight or more stay in the hospital to treat acute condition  Anticipated DC barriers: No barriers to discharge identified at this time  Consults called:  Cardiology, they sent patient over to the emergency room to be evaluated and admitted in the Will consult and see patient in the morning Admission status:  Inpatient   Yevonne Aline Jovita Persing MD [facility]  How to contact the [facility] Attending or Consulting provider [place] or covering provider during after hours [place], for this patient?   Check the care team in [facility] and look for a) attending/consulting [facility] provider listed and b) the [facility] team listed Log into [email or web address] and use [place]'s universal password to access. If you do not have the password, please contact the hospital operator. Locate the [facility] provider you are looking for under [facility] and page to a number that you can be directly reached. If you still have difficulty reaching the provider, please page the [facility] (Director on Call) for the Hospitalists listed on amion for assistance.  06/19/2021, 11:24 PM

## 2021-06-19 NOTE — ED Triage Notes (Signed)
Pt sent by doctor for CHF exacerbation. Pt denies sob, mild swelling noted to BLE. Resp e.u

## 2021-06-19 NOTE — ED Notes (Signed)
Daughter called  again. No one has contacted. Please call when you are available.

## 2021-06-20 ENCOUNTER — Other Ambulatory Visit (HOSPITAL_COMMUNITY): Payer: Self-pay

## 2021-06-20 DIAGNOSIS — R778 Other specified abnormalities of plasma proteins: Secondary | ICD-10-CM

## 2021-06-20 LAB — URINALYSIS, ROUTINE W REFLEX MICROSCOPIC
Bilirubin Urine: NEGATIVE
Glucose, UA: NEGATIVE mg/dL
Hgb urine dipstick: NEGATIVE
Ketones, ur: NEGATIVE mg/dL
Nitrite: NEGATIVE
Protein, ur: 30 mg/dL — AB
Specific Gravity, Urine: 1.011 (ref 1.005–1.030)
pH: 6 (ref 5.0–8.0)

## 2021-06-20 LAB — BASIC METABOLIC PANEL
Anion gap: 10 (ref 5–15)
BUN: 58 mg/dL — ABNORMAL HIGH (ref 8–23)
CO2: 23 mmol/L (ref 22–32)
Calcium: 10 mg/dL (ref 8.9–10.3)
Chloride: 105 mmol/L (ref 98–111)
Creatinine, Ser: 1.84 mg/dL — ABNORMAL HIGH (ref 0.44–1.00)
GFR, Estimated: 27 mL/min — ABNORMAL LOW (ref >60)
Glucose, Bld: 121 mg/dL — ABNORMAL HIGH (ref 70–99)
Potassium: 3.2 mmol/L — ABNORMAL LOW (ref 3.5–5.1)
Sodium: 138 mmol/L (ref 135–145)

## 2021-06-20 LAB — RESP PANEL BY RT-PCR (FLU A&B, COVID) ARPGX2
Influenza A by PCR: NEGATIVE
Influenza B by PCR: NEGATIVE
SARS Coronavirus 2 by RT PCR: NEGATIVE

## 2021-06-20 LAB — TROPONIN I (HIGH SENSITIVITY)
Troponin I (High Sensitivity): 38 ng/L — ABNORMAL HIGH (ref <18)
Troponin I (High Sensitivity): 42 ng/L — ABNORMAL HIGH (ref <18)

## 2021-06-20 LAB — MRSA NEXT GEN BY PCR, NASAL: MRSA by PCR Next Gen: NOT DETECTED

## 2021-06-20 MED ORDER — MORPHINE SULFATE (PF) 2 MG/ML IV SOLN
2.0000 mg | Freq: Once | INTRAVENOUS | Status: AC
Start: 2021-06-20 — End: 2021-06-20
  Administered 2021-06-20: 2 mg via INTRAVENOUS
  Filled 2021-06-20: qty 1

## 2021-06-20 MED ORDER — POLYETHYLENE GLYCOL 3350 17 G PO PACK
17.0000 g | PACK | Freq: Every day | ORAL | Status: DC
  Administered 2021-06-20: 17 g via ORAL
  Filled 2021-06-20: qty 1

## 2021-06-20 MED ORDER — ISOSORB DINITRATE-HYDRALAZINE 20-37.5 MG PO TABS
1.0000 | ORAL_TABLET | Freq: Two times a day (BID) | ORAL | Status: DC
  Administered 2021-06-20 – 2021-06-21 (×3): 1 via ORAL
  Filled 2021-06-20 (×3): qty 1

## 2021-06-20 MED ORDER — LORATADINE 10 MG PO TABS
10.0000 mg | ORAL_TABLET | Freq: Every day | ORAL | Status: DC | PRN
  Administered 2021-06-20: 10 mg via ORAL
  Filled 2021-06-20: qty 1

## 2021-06-20 MED ORDER — POTASSIUM CHLORIDE ER 10 MEQ PO TBCR
10.0000 meq | EXTENDED_RELEASE_TABLET | Freq: Every day | ORAL | Status: DC
  Administered 2021-06-20 – 2021-06-21 (×2): 10 meq via ORAL
  Filled 2021-06-20 (×5): qty 1

## 2021-06-20 MED ORDER — POTASSIUM CHLORIDE ER 10 MEQ PO TBCR
10.0000 meq | EXTENDED_RELEASE_TABLET | ORAL | Status: AC
Start: 2021-06-20 — End: 2021-06-20
  Administered 2021-06-20 (×4): 10 meq via ORAL
  Filled 2021-06-20 (×6): qty 1

## 2021-06-20 MED ORDER — ALUM & MAG HYDROXIDE-SIMETH 200-200-20 MG/5ML PO SUSP: 30.0000 mL | ORAL | Status: DC | PRN

## 2021-06-20 NOTE — Progress Notes (Signed)
Heart Failure Stewardship Pharmacist Progress Note   PCP: Holly Seashore, MD PCP-Cardiologist: None    HPI:  80 yo F with PMH of HTN, CAD, AV block s/p PPM, and CHF. She presented to the ED on 8/16 with worsening CHF. Outpatient diuretic management failed with worsening LE edema, JVD, and ongoing dyspnea and fatigue warranting inpatient admission for IV diuretics. CXR on admission consistent with CHF. Her last ECHO was done on 03/27/21 and LVEF was <20%.  Current HF Medications: Furosemide 40 mg IV BID Metoprolol XL 25 mg daily BiDil 20/37.5 mg 1 tab BID  Prior to admission HF Medications: Torsemide 20 mg BID Metoprolol XL 25 mg daily  Pertinent Lab Values: Serum creatinine 1.84, BUN 58, Potassium 3.2, Sodium 138, BNP >4500  Vital Signs: Weight: 131 lbs (admission weight: 131 lbs) Blood pressure: 110/70s  Heart rate: 60s   Medication Assistance / Insurance Benefits Check: Does the patient have prescription insurance?  Yes Type of insurance plan: [facility] Medicare  Outpatient Pharmacy:  Prior to admission outpatient [facility] Is the patient willing to use [facility] at discharge? Yes Is the patient willing to transition their outpatient pharmacy to utilize a [facility] outpatient pharmacy?   Pending    Assessment: 1. Acute on chronic systolic CHF (EF <69%), due to NICM (only with moderate CAD from cath in 2019). NYHA class IV symptoms. - Continue furosemide 40 mg IV BID - Continue metoprolol XL 25 mg daily - Noted intolerance to lisinopril and Entresto (rash, cough) - Agree with starting BiDil 20/37.5 mg 1 tab BID - eGFR borderline for SGLT2i   Plan: 1) Medication changes recommended at this time: - Continue IV diuretics  2) Patient assistance: - BiDil copay $0 per month - Farxiga copay $0 per month  3)  Education  - To be completed prior to discharge  Kerby Nora, PharmD, BCPS Heart Failure Cytogeneticist Phone [phone]

## 2021-06-20 NOTE — ED Notes (Signed)
Pt transferred to floor via stretcher via rn

## 2021-06-20 NOTE — Consult Note (Signed)
CARDIOLOGY CONSULT NOTE  Patient ID: Holly [name] MRN: [id number] DOB/AGE: [birth year] 80 y.o.  Admit date: 06/19/2021 Referring Physician  Barb Merino, MD Primary Physician:  Merrilee Seashore, MD Reason for Consultation:  Acute on chronic heart failure   Patient ID: Holly [name], female    DOB: 1940-11-11, 80 y.o.   MRN: [id number]  Chief Complaint  Patient presents with   Congestive Heart Failure   HPI:    Holly [name]  is a 80 y.o. 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  Patient has had overall decline in her baseline functional status over the last several months.  Patient was seen in our office 06/14/2021 with acute on chronic heart failure, however patient refused inpatient management and therefore attempted outpatient management with up titration of torsemide.  Also at that time ordered additional testing due to reported confusion and hallucinations, UA last week showed many bacteria and chest x-ray with possible infiltrate.  Patient was reevaluated in our office yesterday with worsening bilateral lower leg edema DVD as well as continued dyspnea and fatigue.  Shared decision was to proceed with inpatient management with IV diuretics.  Patient was started on Lasix 40 mg IV twice daily yesterday.  Patient is seen and examined this morning at approximately 8:00 AM with daughter present at bedside.  Patient states her shortness of breath has minimally improved since yesterday.  She continues to have bilateral lower leg edema and remains fatigued.  Past Medical History:  Diagnosis Date   AV block, 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[place]) 07/15/2017   Bronchitis    HFrEF (heart failure with reduced ejection fraction) ([place]) 04/21/2020   Hypertension    ICD: CRTD Medtronic Device Crtp Percepta Quad Mri  04/23/2021 04/23/2021       Past Surgical History:  Procedure Laterality Date   BIV UPGRADE N/A 04/05/2021   Procedure: BIV UPGRADE;  Surgeon: Vickie Epley, MD;  Location: [facility];  Service: Cardiovascular;  Laterality: N/A;   LEFT HEART CATH AND CORONARY ANGIOGRAPHY N/A 11/11/2017   Procedure: LEFT HEART CATH AND CORONARY ANGIOGRAPHY;  Surgeon: Nigel Mormon, MD;  Location: [facility];  Service: Cardiovascular;  Laterality: N/A;   left leg surgery     PACEMAKER IMPLANT N/A 07/15/2017   Procedure: Pacemaker Implant;  Surgeon: Constance Haw, MD;  Location: [facility];  Service: Cardiovascular;  Laterality: N/A;   UPPER EXTREMITY VENOGRAPHY N/A 04/05/2021   Procedure: UPPER EXTREMITY VENOGRAPHY;  Surgeon: Vickie Epley, MD;  Location: [facility];  Service: Cardiovascular;  Laterality: N/A;   Family History  Problem Relation Age of Onset   Hypertension Mother    ALS Sister    Heart attack Brother    Diabetes Daughter    Social History   Tobacco Use   Smoking status: Never   Smokeless tobacco: Never  Substance Use Topics   Alcohol use: No    Marital Sttus: Divorced  ROS  Review of Systems  Constitutional: Positive for malaise/fatigue. Negative for weight gain.  Cardiovascular:  Positive for leg swelling. Negative for chest pain, claudication, near-syncope, orthopnea, palpitations, paroxysmal nocturnal dyspnea and syncope.  Respiratory:  Positive for shortness of breath.   Neurological:  Negative for dizziness.  All other systems reviewed and are negative. Objective   Vitals with BMI 06/20/2021 06/20/2021 06/19/2021  Height '5\' 4"'$  - -  Weight 131 lbs 6 oz - -  BMI [id number] - -  Systolic 123XX123 123XX123 [id number]  Diastolic 77 77 76  Pulse - 63 61    Blood pressure 122/77, pulse 63, temperature (!) 97.5 F (36.4 C), temperature source Oral, resp. rate 12, height '5\' 4"'$  (1.626 m), weight 59.6 kg, SpO2 99 %.    Physical Exam Vitals reviewed.   HENT:     Head: Normocephalic and atraumatic.     Right Ear: External ear normal.     Left Ear: External ear normal.     Nose: Nose normal.  Eyes:     Conjunctiva/sclera: Conjunctivae normal.  Neck:     Vascular: JVD present. No carotid bruit.  Cardiovascular:     Rate and Rhythm: Normal rate and regular rhythm.     Pulses: Intact distal pulses.     Heart sounds: S1 normal and S2 normal. No murmur heard.   No gallop.  Pulmonary:     Effort: Pulmonary effort is normal.     Breath sounds: Wheezing (bilaterally) present.  Abdominal:     General: Bowel sounds are normal. There is no distension.     Palpations: Abdomen is soft.  Musculoskeletal:     Cervical back: Neck supple.     Right lower leg: Edema (2+ pitting) present.     Left lower leg: Edema (2+ pitting) present.  Skin:    General: Skin is warm and dry.  Neurological:     General: No focal deficit present.     Mental Status: She is alert. Mental status is at baseline.  Psychiatric:        Mood and Affect: Mood normal.        Behavior: Behavior normal.   Laboratory examination:   Recent Labs    08/25/20 1601 03/26/21 1523 04/08/21 0413 06/15/21 1205 06/19/21 1531 06/20/21 0250  NA 132*   < > 132* 139 137 138  K 4.8   < > 4.6 4.5 3.3* 3.2*  CL 98   < > 99 101 102 105  CO2 20   < > 25 16* 25 23  GLUCOSE 109*   < > 94 112* 94 121*  BUN 37*   < > 39* 72* 62* 58*  CREATININE 1.36*   < > 1.76* 1.94* 2.01* 1.84*  CALCIUM 10.4*   < > 11.4* 11.0* 10.1 10.0  GFRNONAA 37*   < > 29*  --  24* 27*  GFRAA 42*  --   --   --   --   --    < > = values in this interval not displayed.   estimated creatinine clearance is 20.7 mL/min (A) (by C-G formula based on SCr of 1.84 mg/dL (H)).  CMP Latest Ref Rng & Units 06/20/2021 06/19/2021 06/15/2021  Glucose 70 - 99 mg/dL 121(H) 94 112(H)  BUN 8 - 23 mg/dL 58(H) 62(H) 72(H)  Creatinine 0.44 - 1.00 mg/dL 1.84(H) 2.01(H) 1.94(H)  Sodium 135 - 145 mmol/L 138 137 139  Potassium 3.5 -  5.1 mmol/L 3.2(L) 3.3(L) 4.5  Chloride 98 - 111 mmol/L 105 102 101  CO2 22 - 32 mmol/L 23 25 16(L)  Calcium 8.9 - 10.3 mg/dL 10.0 10.1 11.0(H)  Total Protein 6.5 - 8.1 g/dL - 7.1 7.9  Total Bilirubin 0.3 - 1.2 mg/dL - 1.4(H) 1.0  Alkaline Phos 38 - 126 U/L - 58 70  AST 15 - 41 U/L - 24 19  ALT 0 -  44 U/L - 15 15   CBC Latest Ref Rng & Units 06/19/2021 06/15/2021 04/08/2021  WBC 4.0 - 10.5 K/uL 4.6 4.3 4.1  Hemoglobin 12.0 - 15.0 g/dL 11.1(L) 11.9 10.1(L)  Hematocrit 36.0 - 46.0 % 35.5(L) 37.1 31.2(L)  Platelets 150 - 400 K/uL 204 202 180   Lipid Panel No results for input(s): CHOL, TRIG, LDLCALC, VLDL, HDL, CHOLHDL, LDLDIRECT in the last 8760 hours.  HEMOGLOBIN A1C No results found for: HGBA1C, MPG TSH No results for input(s): TSH in the last 8760 hours. BNP (last 3 results) Recent Labs    03/30/21 0738 06/15/21 1205 06/19/21 1530  BNP >4,500.0* >5000.0* >4,500.0*   Results for orders placed or performed during the hospital encounter of 06/19/21 (from the past 48 hour(s))  Brain natriuretic peptide     Status: Abnormal   Collection Time: 06/19/21  3:30 PM  Result Value Ref Range   B Natriuretic Peptide >4,500.0 (H) 0.0 - 100.0 pg/mL    Comment: Performed at [facility] [address] [place], [place] [address]  CBC with Differential/Platelet     Status: Abnormal   Collection Time: 06/19/21  3:31 PM  Result Value Ref Range   WBC 4.6 4.0 - 10.5 K/uL   RBC 4.21 3.87 - 5.11 MIL/uL   Hemoglobin 11.1 (L) 12.0 - 15.0 g/dL   HCT 35.5 (L) 36.0 - 46.0 %   MCV 84.3 80.0 - 100.0 fL   MCH 26.4 26.0 - 34.0 pg   MCHC 31.3 30.0 - 36.0 g/dL   RDW 18.6 (H) 11.5 - 15.5 %   Platelets 204 150 - 400 K/uL   nRBC 0.0 0.0 - 0.2 %   Neutrophils Relative % 55 %   Neutro Abs 2.6 1.7 - 7.7 K/uL   Lymphocytes Relative 18 %   Lymphs Abs 0.8 0.7 - 4.0 K/uL   Monocytes Relative 18 %   Monocytes Absolute 0.8 0.1 - 1.0 K/uL   Eosinophils Relative 7 %   Eosinophils Absolute 0.3 0.0 - 0.5  K/uL   Basophils Relative 2 %   Basophils Absolute 0.1 0.0 - 0.1 K/uL   Immature Granulocytes 0 %   Abs Immature Granulocytes 0.02 0.00 - 0.07 K/uL    Comment: Performed at [facility] [address], [place], [place] [address]  Comprehensive metabolic panel     Status: Abnormal   Collection Time: 06/19/21  3:31 PM  Result Value Ref Range   Sodium 137 135 - 145 mmol/L   Potassium 3.3 (L) 3.5 - 5.1 mmol/L   Chloride 102 98 - 111 mmol/L   CO2 25 22 - 32 mmol/L   Glucose, Bld 94 70 - 99 mg/dL    Comment: Glucose reference range applies only to samples taken after fasting for at least 8 hours.   BUN 62 (H) 8 - 23 mg/dL   Creatinine, Ser 2.01 (H) 0.44 - 1.00 mg/dL   Calcium 10.1 8.9 - 10.3 mg/dL   Total Protein 7.1 6.5 - 8.1 g/dL   Albumin 3.3 (L) 3.5 - 5.0 g/dL   AST 24 15 - 41 U/L   ALT 15 0 - 44 U/L   Alkaline Phosphatase 58 38 - 126 U/L   Total Bilirubin 1.4 (H) 0.3 - 1.2 mg/dL   GFR, Estimated 24 (L) >60 mL/min    Comment: (NOTE) Calculated using the CKD-EPI Creatinine Equation (2021)    Anion gap 10 5 - 15    Comment: Performed at [facility], [address], [place], [place] [address]  Troponin I (High Sensitivity)     Status: Abnormal   Collection Time: 06/19/21  3:31 PM  Result Value Ref Range   Troponin I (High Sensitivity) 48 (H) <18 ng/L    Comment: (NOTE) Elevated high sensitivity troponin I (hsTnI) values and significant  changes across serial measurements may suggest ACS but many other  chronic and acute conditions are known to elevate hsTnI results.  Refer to the "Links" section for chest pain algorithms and additional  guidance. Performed at [facility], [place] [address] [place], [place] [address]   Troponin I (High Sensitivity)     Status: Abnormal   Collection Time: 06/19/21  5:36 PM  Result Value Ref Range   Troponin I (High Sensitivity) 46 (H) <18 ng/L    Comment: (NOTE) Elevated high sensitivity troponin I (hsTnI) values and  significant  changes across serial measurements may suggest ACS but many other  chronic and acute conditions are known to elevate hsTnI results.  Refer to the "Links" section for chest pain algorithms and additional  guidance. Performed at [facility], [place] [address], [place], [place] [address]   Resp Panel by RT-PCR (Flu A&B, Covid) Nasopharyngeal Swab     Status: None   Collection Time: 06/19/21 11:00 PM   Specimen: Nasopharyngeal Swab; Nasopharyngeal(NP) swabs in vial transport medium  Result Value Ref Range   SARS Coronavirus 2 by RT PCR NEGATIVE NEGATIVE    Comment: (NOTE) SARS-CoV-2 target nucleic acids are NOT DETECTED.  The SARS-CoV-2 RNA is generally detectable in upper respiratory specimens during the acute phase of infection. The lowest concentration of SARS-CoV-2 viral copies this assay can detect is 138 copies/mL. A negative result does not preclude SARS-Cov-2 infection and should not be used as the sole basis for treatment or other patient management decisions. A negative result may occur with  improper specimen collection/handling, submission of specimen other than nasopharyngeal swab, presence of viral mutation(s) within the areas targeted by this assay, and inadequate number of viral copies(<138 copies/mL). A negative result must be combined with clinical observations, patient history, and epidemiological information. The expected result is Negative.  Fact Sheet for Patients:  EntrepreneurPulse.com.au  Fact Sheet for Healthcare Providers:  IncredibleEmployment.be  This test is no t yet approved or cleared by the [place] FDA and  has been authorized for detection and/or diagnosis of SARS-CoV-2 by FDA under an Emergency Use Authorization (EUA). This EUA will remain  in effect (meaning this test can be used) for the duration of the COVID-19 declaration under Section 564(b)(1) of the Act, 21 U.S.C.section  360bbb-3(b)(1), unless the authorization is terminated  or revoked sooner.       Influenza A by PCR NEGATIVE NEGATIVE   Influenza B by PCR NEGATIVE NEGATIVE    Comment: (NOTE) The Xpert Xpress SARS-CoV-2/FLU/RSV plus assay is intended as an aid in the diagnosis of influenza from Nasopharyngeal swab specimens and should not be used as a sole basis for treatment. Nasal washings and aspirates are unacceptable for Xpert Xpress SARS-CoV-2/FLU/RSV testing.  Fact Sheet for Patients: EntrepreneurPulse.com.au  Fact Sheet for Healthcare Providers: IncredibleEmployment.be  This test is not yet approved or cleared by the [place] FDA and has been authorized for detection and/or diagnosis of SARS-CoV-2 by FDA under an Emergency Use Authorization (EUA). This EUA will remain in effect (meaning this test can be used) for the duration of the COVID-19 declaration under Section 564(b)(1) of the Act,  21 U.S.C. section 360bbb-3(b)(1), unless the authorization is terminated or revoked.  Performed at [facility], [place] [address], [place], [place] [address]   Troponin I (High Sensitivity)     Status: Abnormal   Collection Time: 06/20/21 12:05 AM  Result Value Ref Range   Troponin I (High Sensitivity) 38 (H) <18 ng/L    Comment: (NOTE) Elevated high sensitivity troponin I (hsTnI) values and significant  changes across serial measurements may suggest ACS but many other  chronic and acute conditions are known to elevate hsTnI results.  Refer to the "Links" section for chest pain algorithms and additional  guidance. Performed at [facility], [place] [address], [place], [place] [address]   Urinalysis, Routine w reflex microscopic Urine, Clean Catch     Status: Abnormal   Collection Time: 06/20/21 12:30 AM  Result Value Ref Range   Color, Urine YELLOW YELLOW   APPearance HAZY (A) CLEAR   Specific Gravity, Urine 1.011 1.005 - 1.030   pH 6.0 5.0 -  8.0   Glucose, UA NEGATIVE NEGATIVE mg/dL   Hgb urine dipstick NEGATIVE NEGATIVE   Bilirubin Urine NEGATIVE NEGATIVE   Ketones, ur NEGATIVE NEGATIVE mg/dL   Protein, ur 30 (A) NEGATIVE mg/dL   Nitrite NEGATIVE NEGATIVE   Leukocytes,Ua MODERATE (A) NEGATIVE   RBC / HPF 0-5 0 - 5 RBC/hpf   WBC, UA 6-10 0 - 5 WBC/hpf   Bacteria, UA RARE (A) NONE SEEN   Squamous Epithelial / LPF 0-5 0 - 5   Mucus PRESENT     Comment: Performed at [facility], [place] [address] [place], [place] [address]  MRSA Next Gen by PCR, Nasal     Status: None   Collection Time: 06/20/21  2:17 AM   Specimen: Nasal Mucosa; Nasal Swab  Result Value Ref Range   MRSA by PCR Next Gen NOT DETECTED NOT DETECTED    Comment: (NOTE) The GeneXpert MRSA Assay (FDA approved for NASAL specimens only), is one component of a comprehensive MRSA colonization surveillance program. It is not intended to diagnose MRSA infection nor to guide or monitor treatment for MRSA infections. Test performance is not FDA approved in patients less than 47 years old. Performed at [facility], [place] [address] [place], [place] [id number]   Basic metabolic panel     Status: Abnormal   Collection Time: 06/20/21  2:50 AM  Result Value Ref Range   Sodium 138 135 - 145 mmol/L   Potassium 3.2 (L) 3.5 - 5.1 mmol/L   Chloride 105 98 - 111 mmol/L   CO2 23 22 - 32 mmol/L   Glucose, Bld 121 (H) 70 - 99 mg/dL    Comment: Glucose reference range applies only to samples taken after fasting for at least 8 hours.   BUN 58 (H) 8 - 23 mg/dL   Creatinine, Ser 1.84 (H) 0.44 - 1.00 mg/dL   Calcium 10.0 8.9 - 10.3 mg/dL   GFR, Estimated 27 (L) >60 mL/min    Comment: (NOTE) Calculated using the CKD-EPI Creatinine Equation (2021)    Anion gap 10 5 - 15    Comment: Performed at [facility] [address], [place], [place] [address]  Troponin I (High Sensitivity)     Status: Abnormal   Collection Time: 06/20/21  2:50 AM  Result Value Ref Range    Troponin I (High Sensitivity) 42 (H) <18 ng/L    Comment: (NOTE) Elevated high sensitivity troponin I (  hsTnI) values and significant  changes across serial measurements may suggest ACS but many other  chronic and acute conditions are known to elevate hsTnI results.  Refer to the "Links" section for chest pain algorithms and additional  guidance. Performed at [facility], [place] [address] [place], [place] [address]     Medications and allergies   Allergies  Allergen Reactions   Entresto [Sacubitril-Valsartan] Itching, Rash and Cough   Lisinopril Cough   Venlafaxine Rash     Current Meds  Medication Sig   ALPRAZolam (XANAX) 0.25 MG tablet Take 1 tablet by mouth daily as needed for anxiety.    apixaban (ELIQUIS) 2.5 MG TABS tablet Take 1 tablet (2.5 mg total) by mouth 2 (two) times daily.   Ascorbic Acid (VITAMIN C) 1000 MG tablet Take 1,000 mg by mouth daily.   Biotin 5000 MCG CAPS Take 1 capsule by mouth daily.   clobetasol ointment (TEMOVATE) [id number] % Apply 1 application topically 2 (two) times daily as needed (itching).    clotrimazole-betamethasone (LOTRISONE) cream Apply 1 application topically in the morning and at bedtime.   EUCRISA 2 % OINT Apply 1 application topically daily as needed (dry skin).    losartan (COZAAR) 25 MG tablet Take 25 mg by mouth daily.   metoprolol succinate (TOPROL-XL) 50 MG 24 hr tablet Take 0.5 tablets (25 mg total) by mouth daily. Take with or immediately following a meal.   nitroGLYCERIN (NITROSTAT) 0.4 MG SL tablet Place 1 tablet (0.4 mg total) under the tongue every 5 (five) minutes as needed for chest pain.   OLOPATADINE HCL OP Place 1 drop into both eyes 2 (two) times daily.   pantoprazole (PROTONIX) 40 MG tablet Take 40 mg by mouth daily as needed (heartburn).   rosuvastatin (CRESTOR) 10 MG tablet TAKE 1 TABLET BY MOUTH EVERY DAY (Patient taking differently: Take 10 mg by mouth every evening.)   torsemide (DEMADEX) 20 MG tablet Daily  (Patient taking differently: Take 20 mg by mouth 2 (two) times daily.)   triamcinolone cream (KENALOG) 0.1 % Apply topically 2 (two) times daily as needed (rash).   Vitamin D, Ergocalciferol, 50 MCG (2000 UT) CAPS Take 1 capsule by mouth daily.    Scheduled Meds:  apixaban  2.5 mg Oral BID   furosemide  40 mg Intravenous Q12H   losartan  25 mg Oral Daily   metoprolol succinate  25 mg Oral Daily   rosuvastatin  10 mg Oral QPM   sodium chloride flush  3 mL Intravenous Q12H   Continuous Infusions:  sodium chloride     PRN Meds:.sodium chloride, acetaminophen **OR** acetaminophen, nitroGLYCERIN, senna-docusate, sodium chloride flush   I/O last 3 completed shifts: In: -  Out: 200 [Urine:200] No intake/output data recorded.    Radiology:  DG Chest 1 View 06/19/2021 Pacer/AICD device. Midline trachea. Apical lordotic positioning. Moderate cardiomegaly. Atherosclerosis in the transverse aorta. No pleural effusion or pneumothorax. Pulmonary interstitial prominence and indistinctness is mild. No lobar consolidation. Degenerative changes of the right shoulder. Degenerative and possibly postsurgical changes of the left shoulder.   Cardiomegaly and mild congestive heart failure. Aortic Atherosclerosis (ICD10-I70.0).   Cardiac Studies:   BiV upgrade 04/05/2021: 1.  Left upper extremity venography.  2.  Existing dual-chamber pacemaker generator removal  3.  Coronary sinus lead addition   Echocardiogram 03/27/2021: 1. Left ventricular ejection fraction, by estimation, is <20%. The left  ventricle has severely decreased function. The left ventricle demonstrates  global hypokinesis. There is mild left  ventricular hypertrophy. Left  ventricular diastolic parameters are  indeterminate.   2. Right ventricular systolic function is normal. The right ventricular  size is normal.   3. Left atrial size was severely dilated.   4. Right atrial size was severely dilated.   5. The mitral valve is normal  in structure. Severe mitral valve  regurgitation. No evidence of mitral stenosis.   6. Tricuspid valve regurgitation is severe.   7. The aortic valve is normal in structure. There is mild calcification  of the aortic valve. There is mild thickening of the aortic valve. Aortic  valve regurgitation is trivial. Mild to moderate aortic valve  sclerosis/calcification is present, without  any evidence of aortic stenosis.   8. The inferior vena cava is dilated in size with <50% respiratory  variability, suggesting right atrial pressure of 15 mmHg.   Lexiscan myoview stress test 08/04/2017:  1. Non-diagnostic due to paced rhythm. 2. Study quality: good. The left ventricular chamber dimensions are normal. There is medium scar of the inferolateral segment with moderate to severe residual ischemia. There is small scar of the inferior segment with very mild residual ischemia. 3. Systolic function is mildly reduced. The calculated stress EF is at 44 %. Gated SPECT imaging demonstrates hypokinesis in the inferior, lateral segment(s). 5. Intermediate risk study. Clinical correlation recommended.   Coronary angiogram 11/11/2017: LM: Normal LAD: D1 40% stneoses LCx: Tandem 50% stenoses in mid LCx RCA: Normal   Moderate coronary artery disease. She does have abnormal stress test in inferolateral territory, they are out of proportion to the coronary artery findings. Furthermore, she does not have any critical unstable lesion, to explain nonsustained ventricular tachycardia. She does not have any ongoing chest pain symptoms at this time.Recommend aggressive medical management at this time. If true anginal symptoms occur, could then perform PCI to mid circumflex.   Pacemaker Implant 07/16/2017:  Medtronic Azure XT DR MRI Sure Scan (serial number [id number]) dual chamber pacemaker 07/16/2017 by [facility] for complete heart block.  EKG: 06/19/2021: AV paced rhythm with PVC, no further analysis 03/27/2021:AV dual paced  rhythm  Assessment   Holly [name]  is a 80 y.o. 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  Acute on chronic HFrEF Elevated troponin  Moderate CAD  Paroxysmal atrial fibrillation Hypertension CKD stage IV Pacemaker   Recommendations:   Acute on chronic HFrEF BNP >4500.  Patient remains volume overloaded on exam. Strict INO's, daily weights, low-sodium diet Continue metoprolol succinate 25 mg daily Lasix 40 mg IV twice daily, and her renal function closely.  Continue potassium chloride 10 mEq daily Add BiDil 20/37.51 tablet p.o. twice daily Obtain magnesium level  Discussed at length with patient and her family regarding guarded prognosis with heart failure LVEF <20%.  Patient and her family are aware that she is not a candidate for advanced therapy given age, comorbidities, functional status. Patient and her family agree to reconsider code status at this time.  Elevated troponin  Likely secondary to acute on chronic heart failure.  Low suspicion this is related to underlying coronary artery disease as his troponin is only minimally elevated and flat.  Continue management as above.  Moderate CAD Continue rosuvastatin Continue metoprolol  Paroxysmal atrial fibrillation Continue Eliquis Continue metoprolol  Hypertension Blood pressure is well controlled Continue metoprolol succinate 25 mg daily.  We will also add BiDil 1 tablet p.o. twice daily  CKD stage IV Continue gentle diuresis given acute on chronic HFrEF.  We will monitor renal function closely  Pacemaker - Medtronic Last remote check prior to admission revealed the following: Remote CRT-P alert 06/14/2021: There were total of 3 NSVT episodes, latest 06/12/2021 for 0.1 seconds.  There is no  high atrial rate episodes.  Patient's activity remains low.  Lead measurements are stable.  AP 90.8% and VP 95.5%.  Thoracic impedance is at baseline and does not suggest fluid overload state.  Reiterated today the importance of discussing patient's long-term wishes given that long-term prognosis from HFrEF standpoint is poor.   Again recommended considering palliative care/hospice consult, which patient and her daughter report they are open to during hospitalization, but will first focus on determining patient's wishes regarding CODE STATUS.  Presently patient remains full code.  Patient was seen in collaboration with Dr. Einar Gip. He also reviewed patient's chart and examined the patient. Dr. Einar Gip is in agreement of the plan.    Alethia Berthold, PA-C 06/20/2021, 12:43 PM Office: [phone]

## 2021-06-20 NOTE — ED Notes (Signed)
Attempted to call report. They said they will call me back. 

## 2021-06-20 NOTE — Progress Notes (Addendum)
Shortly after eating lunch patient began to have itching to bilateral lower legs, one specific area of pain in upper left abdomen immediately below ribs cage area, and a lot of phlegm production.  Clairitin was ordered per prn general admission standing orders, lotion applied to legs feet bilaterally. No sign of hives or other remarkable changes to skin in area of itching.  Physician was paged for abdominal pain. See orders. Continue to monitor.   Updated Ivin Booty, daughter, at patient's request. Ivin Booty did state they would rather not have their mom have morphine. They would prefer something "like a very strong Tylenol" for pain in the future.

## 2021-06-20 NOTE — Progress Notes (Signed)
PROGRESS NOTE    Holly [name]  [id number] DOB: 1940-11-23 DOA: 06/19/2021 PCP: Merrilee Seashore, MD    Brief Narrative:  80 year old female with history of HfrEF, hypertension, AICD presented to the emergency room from cardiology office for increasing shortness of breath and swelling of the legs for about 2 weeks.  Orthopnea and PND.  Not able to lay flat.  In the emergency room hemodynamically stable.  Received first dose of Lasix.  BNP more than 4500.  Creatinine 2 about baseline.  Admitted for CHF exacerbation.   Assessment & Plan:   Principal Problem:   Acute on chronic HFrEF (heart failure with reduced ejection fraction) (HCC) Active Problems:   Pacemaker: CRT-P Medtronic Device Crtp Percepta Quad Mri 04/23/2021   Essential hypertension   Leg edema   Prolonged QT interval   CKD (chronic kidney disease) stage 4, GFR 15-29 ml/min (HCC)   Elevated troponin  Acute on chronic systolic congestive heart failure: Recent known ejection fraction 20%.  AICD present: Continue Lasix IV along with potassium. Urine output, 200 mL. Needs strict intake and out put monitoring. Beta-blockers and losartan Cardiology added [place] 20/37.5. Cardiology following.  Essential hypertension: Blood pressure is stable on current regimen of metoprolol.  Randel Pigg was added today.  CKD stage IV: At about her baseline.  Continue to monitor.  Paroxysmal A. fib: Rate well controlled.  Currently sinus rhythm.  On metoprolol.  Therapeutic on Eliquis.  Hypokalemia: Replace aggressively and monitor levels.   DVT prophylaxis: apixaban (ELIQUIS) tablet 2.5 mg Start: 06/19/21 2345 apixaban (ELIQUIS) tablet 2.5 mg   Code Status: Full code Family Communication: Husband and daughter at the bedside. Disposition Plan: Status is: Inpatient  Remains inpatient appropriate because:Inpatient level of care appropriate due to severity of illness  Dispo: The patient is from: Home              Anticipated d/c  is to: Home              Patient currently is not medically stable to d/c.   Difficult to place patient No         Consultants:  Cardiology, Dr. Einar Gip  Procedures:  None  Antimicrobials:  None   Subjective: Patient seen and examined.  Family at the bedside.  Tells me that her breathing is slightly better at rest.  Her main problem is mobilizing and getting shortness of breath.  Complains of hard stool and wants some stool softener.  Objective: Vitals:   06/20/21 0200 06/20/21 0745 06/20/21 0747 06/20/21 1020  BP: 122/77  112/72 100/69  Pulse:  (!) 58 (!) 59 62  Resp:  17 (!) 21 19  Temp: (!) 97.5 F (36.4 C)  (!) 97.3 F (36.3 C) 97.8 F (36.6 C)  TempSrc: Oral Oral Oral Oral  SpO2:  100% 100% 100%  Weight: 59.6 kg     Height: '5\' 4"'$  (1.626 m)       Intake/Output Summary (Last 24 hours) at 06/20/2021 1420 Last data filed at 06/20/2021 1100 Gross per 24 hour  Intake 480 ml  Output 600 ml  Net -120 ml   Filed Weights   06/20/21 0200  Weight: 59.6 kg    Examination:  General exam: Appears calm and comfortable  Keeps falling asleep in between conversation. Chronically sick looking.  Not in any distress. Respiratory system: Mostly bilateral clear.  No added sounds. Cardiovascular system: S1 & S2 heard, RRR.  AICD in place.   Patient has 1+ bilateral pedal edema.  Gastrointestinal system: Abdomen is nondistended, soft and nontender. No organomegaly or masses felt. Normal bowel sounds heard. Central nervous system: Alert and oriented. No focal neurological deficits. Extremities: Symmetric 5 x 5 power. Skin: No rashes, lesions or ulcers Psychiatry: Judgement and insight appear normal. Mood & affect appropriate.     Data Reviewed: I have personally reviewed following labs and imaging studies  CBC: Recent Labs  Lab 06/15/21 1205 06/19/21 1531  WBC 4.3 4.6  NEUTROABS  --  2.6  HGB 11.9 11.1*  HCT 37.1 35.5*  MCV 82 84.3  PLT 202 0000000   Basic  Metabolic Panel: Recent Labs  Lab 06/15/21 1205 06/19/21 1531 06/20/21 0250  NA 139 137 138  K 4.5 3.3* 3.2*  CL 101 102 105  CO2 16* 25 23  GLUCOSE 112* 94 121*  BUN 72* 62* 58*  CREATININE 1.94* 2.01* 1.84*  CALCIUM 11.0* 10.1 10.0   GFR: Estimated Creatinine Clearance: 20.7 mL/min (A) (by C-G formula based on SCr of 1.84 mg/dL (H)). Liver Function Tests: Recent Labs  Lab 06/15/21 1205 06/19/21 1531  AST 19 24  ALT 15 15  ALKPHOS 70 58  BILITOT 1.0 1.4*  PROT 7.9 7.1  ALBUMIN 4.2 3.3*   No results for input(s): LIPASE, AMYLASE in the last 168 hours. No results for input(s): AMMONIA in the last 168 hours. Coagulation Profile: No results for input(s): INR, PROTIME in the last 168 hours. Cardiac Enzymes: No results for input(s): CKTOTAL, CKMB, CKMBINDEX, TROPONINI in the last 168 hours. BNP (last 3 results) Recent Labs    07/20/20 1201  PROBNP 9,907*   HbA1C: No results for input(s): HGBA1C in the last 72 hours. CBG: No results for input(s): GLUCAP in the last 168 hours. Lipid Profile: No results for input(s): CHOL, HDL, LDLCALC, TRIG, CHOLHDL, LDLDIRECT in the last 72 hours. Thyroid Function Tests: No results for input(s): TSH, T4TOTAL, FREET4, T3FREE, THYROIDAB in the last 72 hours. Anemia Panel: No results for input(s): VITAMINB12, FOLATE, FERRITIN, TIBC, IRON, RETICCTPCT in the last 72 hours. Sepsis Labs: No results for input(s): PROCALCITON, LATICACIDVEN in the last 168 hours.  Recent Results (from the past 240 hour(s))  Microscopic Examination     Status: Abnormal   Collection Time: 06/15/21 12:05 PM  Result Value Ref Range Status   WBC, UA 0-5 0 - 5 /hpf Final   RBC 0-2 0 - 2 /hpf Final   Epithelial Cells (non renal) 0-10 0 - 10 /hpf Final   Casts Present (A) None seen /lpf Final   Cast Type Granular casts (A) N/A Final   Bacteria, UA Many (A) None seen/Few Final  Resp Panel by RT-PCR (Flu A&B, Covid) Nasopharyngeal Swab     Status: None    Collection Time: 06/19/21 11:00 PM   Specimen: Nasopharyngeal Swab; Nasopharyngeal(NP) swabs in vial transport medium  Result Value Ref Range Status   SARS Coronavirus 2 by RT PCR NEGATIVE NEGATIVE Final    Comment: (NOTE) SARS-CoV-2 target nucleic acids are NOT DETECTED.  The SARS-CoV-2 RNA is generally detectable in upper respiratory specimens during the acute phase of infection. The lowest concentration of SARS-CoV-2 viral copies this assay can detect is 138 copies/mL. A negative result does not preclude SARS-Cov-2 infection and should not be used as the sole basis for treatment or other patient management decisions. A negative result may occur with  improper specimen collection/handling, submission of specimen other than nasopharyngeal swab, presence of viral mutation(s) within the areas targeted by this assay, and inadequate  number of viral copies(<138 copies/mL). A negative result must be combined with clinical observations, patient history, and epidemiological information. The expected result is Negative.  Fact Sheet for Patients:  EntrepreneurPulse.com.au  Fact Sheet for Healthcare Providers:  IncredibleEmployment.be  This test is no t yet approved or cleared by the [place] FDA and  has been authorized for detection and/or diagnosis of SARS-CoV-2 by FDA under an Emergency Use Authorization (EUA). This EUA will remain  in effect (meaning this test can be used) for the duration of the COVID-19 declaration under Section 564(b)(1) of the Act, 21 U.S.C.section 360bbb-3(b)(1), unless the authorization is terminated  or revoked sooner.       Influenza A by PCR NEGATIVE NEGATIVE Final   Influenza B by PCR NEGATIVE NEGATIVE Final    Comment: (NOTE) The Xpert Xpress SARS-CoV-2/FLU/RSV plus assay is intended as an aid in the diagnosis of influenza from Nasopharyngeal swab specimens and should not be used as a sole basis for treatment.  Nasal washings and aspirates are unacceptable for Xpert Xpress SARS-CoV-2/FLU/RSV testing.  Fact Sheet for Patients: EntrepreneurPulse.com.au  Fact Sheet for Healthcare Providers: IncredibleEmployment.be  This test is not yet approved or cleared by the [place] FDA and has been authorized for detection and/or diagnosis of SARS-CoV-2 by FDA under an Emergency Use Authorization (EUA). This EUA will remain in effect (meaning this test can be used) for the duration of the COVID-19 declaration under Section 564(b)(1) of the Act, 21 U.S.C. section 360bbb-3(b)(1), unless the authorization is terminated or revoked.  Performed at [facility], [place] [address], [place], [place] [address]   MRSA Next Gen by PCR, Nasal     Status: None   Collection Time: 06/20/21  2:17 AM   Specimen: Nasal Mucosa; Nasal Swab  Result Value Ref Range Status   MRSA by PCR Next Gen NOT DETECTED NOT DETECTED Final    Comment: (NOTE) The GeneXpert MRSA Assay (FDA approved for NASAL specimens only), is one component of a comprehensive MRSA colonization surveillance program. It is not intended to diagnose MRSA infection nor to guide or monitor treatment for MRSA infections. Test performance is not FDA approved in patients less than 24 years old. Performed at [facility], [place] [address] [place], [place] [address]          Radiology Studies: DG Chest 1 View  Result Date: 06/19/2021 CLINICAL DATA:  Bilateral leg swelling. Shortness of breath. History of CHF. EXAM: CHEST  1 VIEW COMPARISON:  06/15/2021 FINDINGS: Pacer/AICD device. Midline trachea. Apical lordotic positioning. Moderate cardiomegaly. Atherosclerosis in the transverse aorta. No pleural effusion or pneumothorax. Pulmonary interstitial prominence and indistinctness is mild. No lobar consolidation. Degenerative changes of the right shoulder. Degenerative and possibly postsurgical changes of the left  shoulder. IMPRESSION: Cardiomegaly and mild congestive heart failure. Aortic Atherosclerosis (ICD10-I70.0). Electronically Signed   By: Abigail Miyamoto M.D.   On: 06/19/2021 15:48        Scheduled Meds:  apixaban  2.5 mg Oral BID   furosemide  40 mg Intravenous Q12H   isosorbide-hydrALAZINE  1 tablet Oral BID   metoprolol succinate  25 mg Oral Daily   polyethylene glycol  17 g Oral Daily   potassium chloride  10 mEq Oral Daily   rosuvastatin  10 mg Oral QPM   sodium chloride flush  3 mL Intravenous Q12H   Continuous Infusions:  sodium chloride       LOS: 1 day    Time spent: 31 minutes  Barb Merino, MD [facility] Pager [phone]

## 2021-06-20 NOTE — Discharge Instructions (Signed)

## 2021-06-21 ENCOUNTER — Other Ambulatory Visit (HOSPITAL_COMMUNITY): Payer: Self-pay

## 2021-06-21 LAB — BASIC METABOLIC PANEL
Anion gap: 9 (ref 5–15)
BUN: 53 mg/dL — ABNORMAL HIGH (ref 8–23)
CO2: 24 mmol/L (ref 22–32)
Calcium: 10.3 mg/dL (ref 8.9–10.3)
Chloride: 106 mmol/L (ref 98–111)
Creatinine, Ser: 1.67 mg/dL — ABNORMAL HIGH (ref 0.44–1.00)
GFR, Estimated: 31 mL/min — ABNORMAL LOW (ref >60)
Glucose, Bld: 85 mg/dL (ref 70–99)
Potassium: 4 mmol/L (ref 3.5–5.1)
Sodium: 139 mmol/L (ref 135–145)

## 2021-06-21 LAB — MAGNESIUM: Magnesium: 2 mg/dL (ref 1.7–2.4)

## 2021-06-21 MED ORDER — TORSEMIDE 20 MG PO TABS: 20.0000 mg | ORAL_TABLET | Freq: Two times a day (BID) | ORAL | Status: DC

## 2021-06-21 MED ORDER — ISOSORB DINITRATE-HYDRALAZINE 20-37.5 MG PO TABS
1.0000 | ORAL_TABLET | Freq: Two times a day (BID) | ORAL | 0 refills | Status: DC
  Filled 2021-06-21: qty 60, 30d supply, fill #0

## 2021-06-21 MED ORDER — POTASSIUM CHLORIDE CRYS ER 10 MEQ PO TBCR
10.0000 meq | EXTENDED_RELEASE_TABLET | Freq: Every day | ORAL | 0 refills | Status: AC
  Filled 2021-06-21: qty 30, 30d supply, fill #0

## 2021-06-21 NOTE — Progress Notes (Addendum)
Subjective:  Patient seen and examined at approximately 7:45 AM. Patient asleep upon entering room, arousable but somnolent. Patient able to lay flat without dyspnea.  JVD has improved.  Bilateral lower leg edema has improved.  Net -1.3 L.  And renal function stable.  Intake/Output from previous day:  I/O last 3 completed shifts: In: 600 [P.O.:600] Out: 1925 [Urine:1925] No intake/output data recorded.  Blood pressure 115/64, pulse 62, temperature 98.5 F (36.9 C), temperature source Oral, resp. rate 12, height '5\' 4"'  (1.626 m), weight 59 kg, SpO2 97 %. Physical Exam Vitals reviewed.  Neck:     Vascular: No JVD.  Cardiovascular:     Rate and Rhythm: Normal rate and regular rhythm.     Pulses: Intact distal pulses.     Heart sounds: S1 normal and S2 normal. No murmur heard.   No gallop.  Pulmonary:     Effort: Pulmonary effort is normal.     Breath sounds: Normal breath sounds.  Musculoskeletal:     Right lower leg: Edema (minimal) present.     Left lower leg: Edema (minimal) present.  Neurological:     Mental Status: She is alert.    Lab Results: BMP BNP (last 3 results) Recent Labs    03/30/21 0738 06/15/21 1205 06/19/21 1530  BNP >4,500.0* >5000.0* >4,500.0*    ProBNP (last 3 results) Recent Labs    07/20/20 1201  PROBNP 9,907*   BMP Latest Ref Rng & Units 06/21/2021 06/20/2021 06/19/2021  Glucose 70 - 99 mg/dL 85 121(H) 94  BUN 8 - 23 mg/dL 53(H) 58(H) 62(H)  Creatinine 0.44 - 1.00 mg/dL 1.67(H) 1.84(H) 2.01(H)  BUN/Creat Ratio 12 - 28 - - -  Sodium 135 - 145 mmol/L 139 138 137  Potassium 3.5 - 5.1 mmol/L 4.0 3.2(L) 3.3(L)  Chloride 98 - 111 mmol/L 106 105 102  CO2 22 - 32 mmol/L '24 23 25  ' Calcium 8.9 - 10.3 mg/dL 10.3 10.0 10.1   Hepatic Function Latest Ref Rng & Units 06/19/2021 06/15/2021 04/01/2021  Total Protein 6.5 - 8.1 g/dL 7.1 7.9 6.6  Albumin 3.5 - 5.0 g/dL 3.3(L) 4.2 2.8(L)  AST 15 - 41 U/L '24 19 20  ' ALT 0 - 44 U/L '15 15 19  ' Alk Phosphatase 38  - 126 U/L 58 70 35(L)  Total Bilirubin 0.3 - 1.2 mg/dL 1.4(H) 1.0 1.1  Bilirubin, Direct 0.0 - 0.3 mg/dL - - -   CBC Latest Ref Rng & Units 06/19/2021 06/15/2021 04/08/2021  WBC 4.0 - 10.5 K/uL 4.6 4.3 4.1  Hemoglobin 12.0 - 15.0 g/dL 11.1(L) 11.9 10.1(L)  Hematocrit 36.0 - 46.0 % 35.5(L) 37.1 31.2(L)  Platelets 150 - 400 K/uL 204 202 180   Lipid Panel     Component Value Date/Time   CHOL 145 11/01/2019 1126   TRIG 49 11/01/2019 1126   HDL 76 11/01/2019 1126   CHOLHDL 1.9 11/01/2019 1126   LDLCALC 58 11/01/2019 1126   Cardiac Panel (last 3 results) No results for input(s): CKTOTAL, CKMB, TROPONINI, RELINDX in the last 72 hours.  HEMOGLOBIN A1C No results found for: HGBA1C, MPG TSH No results for input(s): TSH in the last 8760 hours. Imaging: DG Chest 1 View 06/19/2021 Pacer/AICD device. Midline trachea. Apical lordotic positioning. Moderate cardiomegaly. Atherosclerosis in the transverse aorta. No pleural effusion or pneumothorax. Pulmonary interstitial prominence and indistinctness is mild. No lobar consolidation. Degenerative changes of the right shoulder. Degenerative and possibly postsurgical changes of the left shoulder.    Cardiomegaly and mild congestive  heart failure. Aortic Atherosclerosis (ICD10-I70.0).   Cardiac Studies: BiV upgrade 04/05/2021: 1.  Left upper extremity venography.  2.  Existing dual-chamber pacemaker generator removal  3.  Coronary sinus lead addition   Echocardiogram 03/27/2021: 1. Left ventricular ejection fraction, by estimation, is <20%. The left  ventricle has severely decreased function. The left ventricle demonstrates  global hypokinesis. There is mild left ventricular hypertrophy. Left  ventricular diastolic parameters are  indeterminate.   2. Right ventricular systolic function is normal. The right ventricular  size is normal.   3. Left atrial size was severely dilated.   4. Right atrial size was severely dilated.   5. The mitral valve is  normal in structure. Severe mitral valve  regurgitation. No evidence of mitral stenosis.   6. Tricuspid valve regurgitation is severe.   7. The aortic valve is normal in structure. There is mild calcification  of the aortic valve. There is mild thickening of the aortic valve. Aortic  valve regurgitation is trivial. Mild to moderate aortic valve  sclerosis/calcification is present, without  any evidence of aortic stenosis.   8. The inferior vena cava is dilated in size with <50% respiratory  variability, suggesting right atrial pressure of 15 mmHg.   Lexiscan myoview stress test 08/04/2017:  1. Non-diagnostic due to paced rhythm. 2. Study quality: good. The left ventricular chamber dimensions are normal. There is medium scar of the inferolateral segment with moderate to severe residual ischemia. There is small scar of the inferior segment with very mild residual ischemia. 3. Systolic function is mildly reduced. The calculated stress EF is at 44 %. Gated SPECT imaging demonstrates hypokinesis in the inferior, lateral segment(s). 5. Intermediate risk study. Clinical correlation recommended.   Coronary angiogram 11/11/2017: LM: Normal LAD: D1 40% stneoses LCx: Tandem 50% stenoses in mid LCx RCA: Normal   Moderate coronary artery disease. She does have abnormal stress test in inferolateral territory, they are out of proportion to the coronary artery findings. Furthermore, she does not have any critical unstable lesion, to explain nonsustained ventricular tachycardia. She does not have any ongoing chest pain symptoms at this time.Recommend aggressive medical management at this time. If true anginal symptoms occur, could then perform PCI to mid circumflex.   Pacemaker Implant 07/16/2017:  Medtronic Azure XT DR MRI Sure Scan (serial number [id number]) dual chamber pacemaker [facility]/16/2017 [facility] for complete heart block.   EKG: 06/19/2021: AV paced rhythm with PVC, no further analysis 03/27/2021:AV  dual paced rhythm  Scheduled Meds:  apixaban  2.5 mg Oral BID   isosorbide-hydrALAZINE  1 tablet Oral BID   metoprolol succinate  25 mg Oral Daily   polyethylene glycol  17 g Oral Daily   potassium chloride  10 mEq Oral Daily   rosuvastatin  10 mg Oral QPM   sodium chloride flush  3 mL Intravenous Q12H   Continuous Infusions:  sodium chloride     PRN Meds:.sodium chloride, acetaminophen **OR** acetaminophen, alum & mag hydroxide-simeth, loratadine, nitroGLYCERIN, senna-docusate, sodium chloride flush  Assessment/Plan:  Acute on chronic HFrEF: Patient has diuresed well with significant improvement of bilateral lower leg edema and dyspnea.  Net -1.3 L since admission. Transition patient from IV Lasix to torsemide 20 mg p.o. twice daily.  Continue potassium supplements Continue BiDil 1 tablet p.o. twice daily Continue metoprolol succinate 25 mg daily Patient does have chronic renal insufficiency and cardiorenal syndrome, will therefore hold off on initiation of ACE inhibitor/ARB. Encourage patient and her family to continue to have discussions  regarding patient's long-term wishes regarding her health given that she is not a candidate for advanced heart failure therapy given age, comorbidities, and functional status.  Moderate CAD: Continue metoprolol Continue rosuvastatin We will follow-up outpatient  Paroxysmal atrial fibrillation: Continue metoprolol Continue Eliquis 2.5 mg p.o. twice daily Continue to follow outpatient  Hypertension: Blood pressure is well controlled, even soft at time of exam this morning.  We will continue to monitor closely. For now continue BiDil, metoprolol   Patient is stable for discharge from cardiovascular standpoint.  We will follow-up on an outpatient basis.  Patient was seen in collaboration with Dr. Einar Gip. He also reviewed patient's chart and examined the patient. Dr. Einar Gip is in agreement of the plan.     Alethia Berthold,  PA-C 06/21/2021, 7:53 AM Office: [phone]

## 2021-06-21 NOTE — Progress Notes (Signed)
Heart Failure Stewardship Pharmacist Progress Note   PCP: Merrilee Seashore, MD PCP-Cardiologist: None    HPI:  80 yo F with PMH of HTN, CAD, AV block s/p PPM, and CHF. She presented to the ED on 8/16 with worsening CHF. Outpatient diuretic management failed with worsening LE edema, JVD, and ongoing dyspnea and fatigue warranting inpatient admission for IV diuretics. CXR on admission consistent with CHF. Her last ECHO was done on 03/27/21 and LVEF was <20%.  Current HF Medications: Metoprolol XL 25 mg daily BiDil 20/37.5 mg 1 tab BID  Prior to admission HF Medications: Torsemide 20 mg BID Metoprolol XL 25 mg daily  Pertinent Lab Values: Serum creatinine 1.67, BUN 53, Potassium 4.0, Sodium 139, BNP >4500  Vital Signs: Weight: 130 lbs (admission weight: 131 lbs) Blood pressure: 110/60s  Heart rate: 60s   Medication Assistance / Insurance Benefits Check: Does the patient have prescription insurance?  Yes Type of insurance plan: [facility] Medicare  Outpatient Pharmacy:  Prior to admission outpatient [facility] Is the patient willing to use [facility] at discharge? Yes Is the patient willing to transition their outpatient pharmacy to utilize a [facility] outpatient pharmacy?   Pending    Assessment: 1. Acute on chronic systolic CHF (EF <65%), due to NICM (only with moderate CAD from cath in 2019). NYHA class III symptoms. - Off IV lasix. Further dosing per cardiology. - Continue metoprolol XL 25 mg daily - Noted intolerance to lisinopril and Entresto (rash, cough) - Continue BiDil 20/37.5 mg 1 tab BID - eGFR borderline for SGLT2i   Plan: 1) Medication changes recommended at this time: - Continue current regimen; further diuretics per cardiology  2) Patient assistance: - BiDil copay $0 per month - Farxiga copay $0 per month  3)  Education  - To be completed prior to discharge  Kerby Nora, PharmD, BCPS Heart Failure Cytogeneticist Phone [phone]

## 2021-06-21 NOTE — TOC Transition Note (Signed)
Transition of Care [facility]) - CM/SW Discharge Note   Patient Details  Name: Holly [name] MRN: [id number] Date of Birth: May 17, 1941  [facility]) CM/SW Contact:  Zenon Mayo, RN Phone Number: 06/21/2021, 1:14 PM   Clinical Narrative:    Patient is for dc today, she is active with [facility] notified.  Daughter would like for them to continue with [facility].  Daughter would also like a hospital bed thru [facility].  NCM made referral to [facility] with [facility], they will contact the daughter to coordinate the delivery of the hospital bed.  Daughter will transport patient home.   Final next level of care: [place] Barriers to Discharge: No Barriers Identified   Patient Goals and CMS Choice Patient states their goals for this hospitalization and ongoing recovery are:: return home CMS Medicare.gov Compare Post Acute Care list provided to:: Patient Represenative (must comment) Choice offered to / list presented to : Adult Children  Discharge Placement                       Discharge Plan and Services                DME Arranged: Hospital bed DME Agency: [facility] Date DME Agency Contacted: 06/21/21 Time DME Agency Contacted: [id number] Representative spoke with at DME Agency: [place]: RN, PT, Nurse's Aide [place] Agency: [place] Date [place]: 06/21/21 Time [place]: 1314 Representative spoke with at [place]: [place] ([place]) Interventions     Readmission Risk Interventions No flowsheet data found.

## 2021-06-21 NOTE — Progress Notes (Signed)
    Durable Medical Equipment  (From admission, onward)           Start     Ordered   06/21/21 1204  For home use only DME Hospital bed  Once       Question Answer Comment  Length of Need Lifetime   Patient has (list medical condition): CHF   The above medical condition requires: Patient requires the ability to reposition frequently   Head must be elevated greater than: 30 degrees   Bed type Semi-electric   Hoyer Lift Yes   Support Surface: Gel Overlay      06/21/21 1203

## 2021-06-21 NOTE — TOC Progression Note (Signed)
Transition of Care [facility]) - Progression Note    Patient Details  Name: AREIANA TAVES MRN: [id number] Date of Birth: [birth year]  Transition of Care [facility]) CM/SW Contact  Zenon Mayo, RN Phone Number: 06/21/2021, 12:08 PM  Clinical Narrative:    NCM received vm from daughter, NCM returned called, per daughter patient needs a hospital bed at home. She already has a bsc , walker and a w/chair.  She is also active with [facility] for [facility],  NCM will ask Levada Dy with [facility] to add an aide to [facility] services.   Daughter is ok with [facility] supplying the hospital bed for them.  NCM made referral to [facility] with [facility] for the hospital bed.  Daughter phone number is [phone] to coordinate the delivery.  She states the bed does not have to be there in order for patient to go home.        Expected Discharge Plan and Services                                                 Social Determinants of Health (SDOH) Interventions    Readmission Risk Interventions No flowsheet data found.

## 2021-06-21 NOTE — Discharge Summary (Signed)
Physician Discharge Summary  Aniko Downing [place] [id number] DOB: [birth year] DOA: 06/19/2021  PCP: Merrilee Seashore, MD  Admit date: 06/19/2021 Discharge date: 06/21/2021  Admitted From: Home Disposition: Home with home health  Recommendations for Outpatient Follow-up:  Follow up with PCP in 1-2 weeks Please obtain BMP/CBC in one week Cardiology will schedule follow-up with you.  Home Health: PT/RN/home health aide Equipment/Devices: None  Discharge Condition: Stable CODE STATUS: Full code Diet recommendation: Low-salt diet  Discharge summary:  80 year old female with history of HfrEF, hypertension, AICD presented to the emergency room from cardiology office for increasing shortness of breath and swelling of the legs for about 2 weeks.  Orthopnea and PND.  Not able to lay flat.  In the emergency room hemodynamically stable.  Received first dose of Lasix.  BNP more than 4500.  Creatinine 2 about baseline.  Admitted for CHF exacerbation.  Admitted and treated with IV diuresis with excellent response.  Symptomatically improved.  Discharging home on the following regimen as per cardiology recommendations Torsemide 20 mg twice daily Potassium 10 mEq daily BiDil 20/37.5 twice a day Metoprolol XL 25 mg daily Avoiding ACE or ARB with allergy to losartan, fluctuating renal functions. Rest of the chronic medical issues are stable.  Electrolytes are adequate.  Stable to discharge.    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   Essential hypertension   Leg edema   Prolonged QT interval   CKD (chronic kidney disease) stage 4, GFR 15-29 ml/min (HCC)   Elevated troponin    Discharge Instructions  Discharge Instructions     Call MD for:  difficulty breathing, headache or visual disturbances   Complete by: As directed     Diet - low sodium heart healthy   Complete by: As directed    Increase activity slowly   Complete by: As directed       Allergies as of 06/21/2021       Reactions   Entresto [sacubitril-valsartan] Itching, Rash, Cough   Lisinopril Cough   Venlafaxine Rash        Medication List     TAKE these medications    ALPRAZolam 0.25 MG tablet Commonly known as: XANAX Take 1 tablet by mouth daily as needed for anxiety.   apixaban 2.5 MG Tabs tablet Commonly known as: Eliquis Take 1 tablet (2.5 mg total) by mouth 2 (two) times daily.   Biotin 5000 MCG Caps Take 1 capsule by mouth daily.   clobetasol ointment 0.05 % Commonly known as: TEMOVATE Apply 1 application topically 2 (two) times daily as needed (itching).   clotrimazole-betamethasone cream Commonly known as: LOTRISONE Apply 1 application topically in the morning and at bedtime.   Eucrisa 2 % Oint Generic drug: Crisaborole Apply 1 application topically daily as needed (dry skin).   isosorbide-hydrALAZINE 20-37.5 MG tablet Commonly known as: BIDIL Take 1 tablet by mouth 2 (two) times daily.   metoprolol succinate 50 MG 24 hr tablet Commonly known as: TOPROL-XL Take 0.5 tablets (25 mg total) by mouth daily. Take with or immediately following a meal.   nitroGLYCERIN 0.4 MG SL tablet Commonly known as: NITROSTAT Place 1 tablet (0.4 mg total) under the tongue every 5 (five) minutes as needed for chest pain.   OLOPATADINE HCL OP Place 1 drop into both eyes 2 (two) times daily.   pantoprazole 40 MG tablet Commonly known  as: PROTONIX Take 40 mg by mouth daily as needed (heartburn).   potassium chloride 10 MEQ tablet Commonly known as: KLOR-CON Take 1 tablet (10 mEq total) by mouth daily. Start taking on: June 22, 2021   rosuvastatin 10 MG tablet Commonly known as: CRESTOR TAKE 1 TABLET BY MOUTH EVERY DAY What changed: when to take this   torsemide 20 MG tablet Commonly known as: DEMADEX Take 1 tablet (20  mg total) by mouth 2 (two) times daily.   triamcinolone cream 0.1 % Commonly known as: KENALOG Apply topically 2 (two) times daily as needed (rash).   vitamin C 1000 MG tablet Take 1,000 mg by mouth daily.   Vitamin D (Ergocalciferol) 50 MCG (2000 UT) Caps Take 1 capsule by mouth daily.               Durable Medical Equipment  (From admission, onward)           Start     Ordered   06/21/21 1204  For home use only DME Hospital bed  Once       Question Answer Comment  Length of Need Lifetime   Patient has (list medical condition): CHF   The above medical condition requires: Patient requires the ability to reposition frequently   Head must be elevated greater than: 30 degrees   Bed type Semi-electric   Hoyer Lift Yes   Support Surface: Gel Overlay      06/21/21 1203            Follow-up Information     Nigel Mormon, MD Follow up on 07/05/2021.   Specialties: Cardiology, Radiology Why: Appointment 11:15 AM 07/05/2021 with Dr. Virgina Jock.  Please bring all medications. Contact information: [place] [address] [facility] up.   Why: hospital bed Contact information: [address] [place] [address] [phone]                Allergies  Allergen Reactions   Entresto [Sacubitril-Valsartan] Itching, Rash and Cough   Lisinopril Cough   Venlafaxine Rash    Consultations: Cardiology   Procedures/Studies: DG Chest 1 View  Result Date: 06/19/2021 CLINICAL DATA:  Bilateral leg swelling. Shortness of breath. History of CHF. EXAM: CHEST  1 VIEW COMPARISON:  06/15/2021 FINDINGS: Pacer/AICD device. Midline trachea. Apical lordotic positioning. Moderate cardiomegaly. Atherosclerosis in the transverse aorta. No pleural effusion or pneumothorax. Pulmonary interstitial prominence and indistinctness is mild. No lobar consolidation. Degenerative changes of the right  shoulder. Degenerative and possibly postsurgical changes of the left shoulder. IMPRESSION: Cardiomegaly and mild congestive heart failure. Aortic Atherosclerosis (ICD10-I70.0). Electronically Signed   By: Abigail Miyamoto M.D.   On: 06/19/2021 15:48   DG Chest 2 View  Result Date: 06/17/2021 CLINICAL DATA:  Shortness of breath. EXAM: CHEST - 2 VIEW COMPARISON:  April 06, 2021 FINDINGS: There is a multi lead AICD. Mild atelectasis and/or infiltrate is seen within the suprahilar region on the right. There is no evidence of a pleural effusion or pneumothorax. The cardiac silhouette is enlarged and unchanged in size. There is mild calcification of the aortic arch. There is levoscoliosis of the thoracic spine with multilevel degenerative changes. A chronic deformity of the left humeral head is noted. IMPRESSION: Stable cardiomegaly with mild right suprahilar atelectasis and/or infiltrate. Electronically Signed   By: Virgina Norfolk M.D.   On: 06/17/2021 01:12   (Echo, Carotid, EGD, Colonoscopy, ERCP)  Subjective: Patient seen and examined.  Daughter at the bedside.  Denies any complaints.  Eager to go home.   Discharge Exam: Vitals:   06/21/21 1027 06/21/21 1151  BP: 95/63 (!) 98/57  Pulse: 62 64  Resp:  17  Temp:  98.3 F (36.8 C)  SpO2:  98%   Vitals:   06/21/21 0637 06/21/21 0706 06/21/21 1027 06/21/21 1151  BP:  115/64 95/63 (!) 98/57  Pulse:  62 62 64  Resp:  12  17  Temp:  98.5 F (36.9 C)  98.3 F (36.8 C)  TempSrc:  Oral  Oral  SpO2:  97%  98%  Weight: 59 kg     Height:        General: Pt is alert, awake, not in acute distress, on room air Cardiovascular: RRR, S1/S2 +, no rubs, no gallops, AICD in place. Respiratory: CTA bilaterally, no wheezing, no rhonchi Abdominal: Soft, NT, ND, bowel sounds + Extremities: Trace bilateral pedal edema.    The results of significant diagnostics from this hospitalization (including imaging, microbiology, ancillary and laboratory) are  listed below for reference.     Microbiology: Recent Results (from the past 240 hour(s))  Microscopic Examination     Status: Abnormal   Collection Time: 06/15/21 12:05 PM  Result Value Ref Range Status   WBC, UA 0-5 0 - 5 /hpf Final   RBC 0-2 0 - 2 /hpf Final   Epithelial Cells (non renal) 0-10 0 - 10 /hpf Final   Casts Present (A) None seen /lpf Final   Cast Type Granular casts (A) N/A Final   Bacteria, UA Many (A) None seen/Few Final  Resp Panel by RT-PCR (Flu A&B, Covid) Nasopharyngeal Swab     Status: None   Collection Time: 06/19/21 11:00 PM   Specimen: Nasopharyngeal Swab; Nasopharyngeal(NP) swabs in vial transport medium  Result Value Ref Range Status   SARS Coronavirus 2 by RT PCR NEGATIVE NEGATIVE Final    Comment: (NOTE) SARS-CoV-2 target nucleic acids are NOT DETECTED.  The SARS-CoV-2 RNA is generally detectable in upper respiratory specimens during the acute phase of infection. The lowest concentration of SARS-CoV-2 viral copies this assay can detect is 138 copies/mL. A negative result does not preclude SARS-Cov-2 infection and should not be used as the sole basis for treatment or other patient management decisions. A negative result may occur with  improper specimen collection/handling, submission of specimen other than nasopharyngeal swab, presence of viral mutation(s) within the areas targeted by this assay, and inadequate number of viral copies(<138 copies/mL). A negative result must be combined with clinical observations, patient history, and epidemiological information. The expected result is Negative.  Fact Sheet for Patients:  EntrepreneurPulse.com.au  Fact Sheet for Healthcare Providers:  IncredibleEmployment.be  This test is no t yet approved or cleared by the [place] FDA and  has been authorized for detection and/or diagnosis of SARS-CoV-2 by FDA under an Emergency Use Authorization (EUA). This EUA will remain   in effect (meaning this test can be used) for the duration of the COVID-19 declaration under Section 564(b)(1) of the Act, 21 U.S.C.section 360bbb-3(b)(1), unless the authorization is terminated  or revoked sooner.       Influenza A by PCR NEGATIVE NEGATIVE Final   Influenza B by PCR NEGATIVE NEGATIVE Final    Comment: (NOTE) The Xpert Xpress SARS-CoV-2/FLU/RSV plus assay is intended as an aid in the diagnosis of influenza from Nasopharyngeal swab specimens and should not be used as a sole basis for  treatment. Nasal washings and aspirates are unacceptable for Xpert Xpress SARS-CoV-2/FLU/RSV testing.  Fact Sheet for Patients: EntrepreneurPulse.com.au  Fact Sheet for Healthcare Providers: IncredibleEmployment.be  This test is not yet approved or cleared by the [place] FDA and has been authorized for detection and/or diagnosis of SARS-CoV-2 by FDA under an Emergency Use Authorization (EUA). This EUA will remain in effect (meaning this test can be used) for the duration of the COVID-19 declaration under Section 564(b)(1) of the Act, 21 U.S.C. section 360bbb-3(b)(1), unless the authorization is terminated or revoked.  Performed at [facility], [place] [address], [place], [place] [address]   MRSA Next Gen by PCR, Nasal     Status: None   Collection Time: 06/20/21  2:17 AM   Specimen: Nasal Mucosa; Nasal Swab  Result Value Ref Range Status   MRSA by PCR Next Gen NOT DETECTED NOT DETECTED Final    Comment: (NOTE) The GeneXpert MRSA Assay (FDA approved for NASAL specimens only), is one component of a comprehensive MRSA colonization surveillance program. It is not intended to diagnose MRSA infection nor to guide or monitor treatment for MRSA infections. Test performance is not FDA approved in patients less than 22 years old. Performed at [facility], [place] [address], [place], [place] [address]      Labs: BNP (last 3  results) Recent Labs    03/30/21 0738 06/15/21 1205 06/19/21 1530  BNP >4,500.0* >5000.0* [id number]*   Basic Metabolic Panel: Recent Labs  Lab 06/15/21 1205 06/19/21 1531 06/20/21 0250 06/21/21 0200  NA 139 137 138 139  K 4.5 3.3* 3.2* 4.0  CL 101 102 105 106  CO2 16* '25 23 24  '$ GLUCOSE 112* 94 121* 85  BUN 72* 62* 58* 53*  CREATININE 1.94* 2.01* 1.84* 1.67*  CALCIUM 11.0* 10.1 10.0 10.3  MG  --   --   --  2.0   Liver Function Tests: Recent Labs  Lab 06/15/21 1205 06/19/21 1531  AST 19 24  ALT 15 15  ALKPHOS 70 58  BILITOT 1.0 1.4*  PROT 7.9 7.1  ALBUMIN 4.2 3.3*   No results for input(s): LIPASE, AMYLASE in the last 168 hours. No results for input(s): AMMONIA in the last 168 hours. CBC: Recent Labs  Lab 06/15/21 1205 06/19/21 1531  WBC 4.3 4.6  NEUTROABS  --  2.6  HGB 11.9 11.1*  HCT 37.1 35.5*  MCV 82 84.3  PLT 202 204   Cardiac Enzymes: No results for input(s): CKTOTAL, CKMB, CKMBINDEX, TROPONINI in the last 168 hours. BNP: Invalid input(s): POCBNP CBG: No results for input(s): GLUCAP in the last 168 hours. D-Dimer No results for input(s): DDIMER in the last 72 hours. Hgb A1c No results for input(s): HGBA1C in the last 72 hours. Lipid Profile No results for input(s): CHOL, HDL, LDLCALC, TRIG, CHOLHDL, LDLDIRECT in the last 72 hours. Thyroid function studies No results for input(s): TSH, T4TOTAL, T3FREE, THYROIDAB in the last 72 hours.  Invalid input(s): FREET3 Anemia work up No results for input(s): VITAMINB12, FOLATE, FERRITIN, TIBC, IRON, RETICCTPCT in the last 72 hours. Urinalysis    Component Value Date/Time   COLORURINE YELLOW 06/20/2021 0030   APPEARANCEUR HAZY (A) 06/20/2021 0030   APPEARANCEUR Cloudy (A) 06/15/2021 1205   LABSPEC 1.011 06/20/2021 0030   PHURINE 6.0 06/20/2021 0030   GLUCOSEU NEGATIVE 06/20/2021 0030   HGBUR NEGATIVE 06/20/2021 0030   BILIRUBINUR NEGATIVE 06/20/2021 0030   BILIRUBINUR Negative 06/15/2021 1205    [place] 06/20/2021  0030   PROTEINUR 30 (A) 06/20/2021 0030   UROBILINOGEN 0.2 07/11/2009 1942   NITRITE NEGATIVE 06/20/2021 0030   LEUKOCYTESUR MODERATE (A) 06/20/2021 0030   Sepsis Labs Invalid input(s): PROCALCITONIN,  WBC,  LACTICIDVEN Microbiology Recent Results (from the past 240 hour(s))  Microscopic Examination     Status: Abnormal   Collection Time: 06/15/21 12:05 PM  Result Value Ref Range Status   WBC, UA 0-5 0 - 5 /hpf Final   RBC 0-2 0 - 2 /hpf Final   Epithelial Cells (non renal) 0-10 0 - 10 /hpf Final   Casts Present (A) None seen /lpf Final   Cast Type Granular casts (A) N/A Final   Bacteria, UA Many (A) None seen/Few Final  Resp Panel by RT-PCR (Flu A&B, Covid) Nasopharyngeal Swab     Status: None   Collection Time: 06/19/21 11:00 PM   Specimen: Nasopharyngeal Swab; Nasopharyngeal(NP) swabs in vial transport medium  Result Value Ref Range Status   SARS Coronavirus 2 by RT PCR NEGATIVE NEGATIVE Final    Comment: (NOTE) SARS-CoV-2 target nucleic acids are NOT DETECTED.  The SARS-CoV-2 RNA is generally detectable in upper respiratory specimens during the acute phase of infection. The lowest concentration of SARS-CoV-2 viral copies this assay can detect is 138 copies/mL. A negative result does not preclude SARS-Cov-2 infection and should not be used as the sole basis for treatment or other patient management decisions. A negative result may occur with  improper specimen collection/handling, submission of specimen other than nasopharyngeal swab, presence of viral mutation(s) within the areas targeted by this assay, and inadequate number of viral copies(<138 copies/mL). A negative result must be combined with clinical observations, patient history, and epidemiological information. The expected result is Negative.  Fact Sheet for Patients:  EntrepreneurPulse.com.au  Fact Sheet for Healthcare Providers:   IncredibleEmployment.be  This test is no t yet approved or cleared by the [place] FDA and  has been authorized for detection and/or diagnosis of SARS-CoV-2 by FDA under an Emergency Use Authorization (EUA). This EUA will remain  in effect (meaning this test can be used) for the duration of the COVID-19 declaration under Section 564(b)(1) of the Act, 21 U.S.C.section 360bbb-3(b)(1), unless the authorization is terminated  or revoked sooner.       Influenza A by PCR NEGATIVE NEGATIVE Final   Influenza B by PCR NEGATIVE NEGATIVE Final    Comment: (NOTE) The Xpert Xpress SARS-CoV-2/FLU/RSV plus assay is intended as an aid in the diagnosis of influenza from Nasopharyngeal swab specimens and should not be used as a sole basis for treatment. Nasal washings and aspirates are unacceptable for Xpert Xpress SARS-CoV-2/FLU/RSV testing.  Fact Sheet for Patients: EntrepreneurPulse.com.au  Fact Sheet for Healthcare Providers: IncredibleEmployment.be  This test is not yet approved or cleared by the [place] FDA and has been authorized for detection and/or diagnosis of SARS-CoV-2 by FDA under an Emergency Use Authorization (EUA). This EUA will remain in effect (meaning this test can be used) for the duration of the COVID-19 declaration under Section 564(b)(1) of the Act, 21 U.S.C. section 360bbb-3(b)(1), unless the authorization is terminated or revoked.  Performed at [facility], [place] [address], [place], [place] [address]   MRSA Next Gen by PCR, Nasal     Status: None   Collection Time: 06/20/21  2:17 AM   Specimen: Nasal Mucosa; Nasal Swab  Result Value Ref Range Status   MRSA by PCR Next Gen NOT DETECTED NOT DETECTED Final  Comment: (NOTE) The GeneXpert MRSA Assay (FDA approved for NASAL specimens only), is one component of a comprehensive MRSA colonization surveillance program. It is not intended to diagnose  MRSA infection nor to guide or monitor treatment for MRSA infections. Test performance is not FDA approved in patients less than 23 years old. Performed at [facility], [place] [address] [place], [place] [address]      Time coordinating discharge:  28 minutes  SIGNED:   Barb Merino, MD  [facility] 06/21/2021, 1:04 PM

## 2021-06-21 NOTE — Plan of Care (Signed)
  Problem: Clinical Measurements: Goal: Ability to maintain clinical measurements within normal limits will improve Outcome: Not Progressing Goal: Diagnostic test results will improve Outcome: Not Progressing   

## 2021-06-21 NOTE — Plan of Care (Signed)
  Problem: Education: Goal: Knowledge of General Education information will improve Description: Including pain rating scale, medication(s)/side effects and non-pharmacologic comfort measures 06/21/2021 1407 by Leonie Man, RN Outcome: Adequate for Discharge 06/21/2021 1131 by Leonie Man, RN Outcome: Progressing   Problem: Health Behavior/Discharge Planning: Goal: Ability to manage health-related needs will improve 06/21/2021 1407 by Leonie Man, RN Outcome: Adequate for Discharge 06/21/2021 1131 by Leonie Man, RN Outcome: Progressing   Problem: Clinical Measurements: Goal: Ability to maintain clinical measurements within normal limits will improve 06/21/2021 1407 by Leonie Man, RN Outcome: Adequate for Discharge 06/21/2021 1131 by Leonie Man, RN Outcome: Not Progressing Goal: Will remain free from infection 06/21/2021 1407 by Leonie Man, RN Outcome: Adequate for Discharge 06/21/2021 1131 by Leonie Man, RN Outcome: Progressing Goal: Diagnostic test results will improve 06/21/2021 1407 by Leonie Man, RN Outcome: Adequate for Discharge 06/21/2021 1131 by Leonie Man, RN Outcome: Not Progressing Goal: Respiratory complications will improve 06/21/2021 1407 by Leonie Man, RN Outcome: Adequate for Discharge 06/21/2021 1131 by Leonie Man, RN Outcome: Progressing Goal: Cardiovascular complication will be avoided 06/21/2021 1407 by Leonie Man, RN Outcome: Adequate for Discharge 06/21/2021 1131 by Leonie Man, RN Outcome: Progressing   Problem: Activity: Goal: Risk for activity intolerance will decrease 06/21/2021 1407 by Leonie Man, RN Outcome: Adequate for Discharge 06/21/2021 1131 by Leonie Man, RN Outcome: Progressing   Problem: Nutrition: Goal: Adequate nutrition will be maintained 06/21/2021 1407 by Leonie Man, RN Outcome: Adequate for Discharge 06/21/2021 1131 by Leonie Man, RN Outcome: Progressing   Problem: Coping: Goal: Level of  anxiety will decrease 06/21/2021 1407 by Leonie Man, RN Outcome: Adequate for Discharge 06/21/2021 1131 by Leonie Man, RN Outcome: Progressing   Problem: Elimination: Goal: Will not experience complications related to bowel motility 06/21/2021 1407 by Leonie Man, RN Outcome: Adequate for Discharge 06/21/2021 1131 by Leonie Man, RN Outcome: Progressing Goal: Will not experience complications related to urinary retention 06/21/2021 1407 by Leonie Man, RN Outcome: Adequate for Discharge 06/21/2021 1131 by Leonie Man, RN Outcome: Progressing   Problem: Pain Managment: Goal: General experience of comfort will improve 06/21/2021 1407 by Leonie Man, RN Outcome: Adequate for Discharge 06/21/2021 1131 by Leonie Man, RN Outcome: Progressing   Problem: Safety: Goal: Ability to remain free from injury will improve 06/21/2021 1407 by Leonie Man, RN Outcome: Adequate for Discharge 06/21/2021 1131 by Leonie Man, RN Outcome: Progressing   Problem: Skin Integrity: Goal: Risk for impaired skin integrity will decrease 06/21/2021 1407 by Leonie Man, RN Outcome: Adequate for Discharge 06/21/2021 1131 by Leonie Man, RN Outcome: Progressing

## 2021-06-22 ENCOUNTER — Telehealth: Payer: Self-pay

## 2021-06-22 NOTE — Telephone Encounter (Signed)
-----   Message from Alethia Berthold, [place] sent at 06/21/2021 10:48 AM EDT ----- Regarding: TOC Needs TOC, should be discharged today and has follow up with MP 07/05/21

## 2021-06-22 NOTE — Telephone Encounter (Signed)
Location of hospitalization:  Reason for hospitalization: Acute on chronic HFrEF (heart failure with reduced ejection fraction) Date of discharge: 06/22/21 Date of first communication with patient: today Person contacting patient: Jayanna Kroeger Current symptoms: na Do you understand why you were in the Hospital: Yes Questions regarding discharge instructions: None Where were you discharged to: Home Medications reviewed: Yes Allergies reviewed: Yes Dietary changes reviewed: Yes. Discussed low fat and low salt diet.  Referals reviewed: NA Activities of Daily Living: Able to with mild limitations Any transportation issues/concerns: None Any patient concerns: None Confirmed importance & date/time of Follow up appt: Yes Confirmed with patient if condition begins to worsen call. Pt was given the office number and encouraged to call back with questions or concerns: Yes

## 2021-06-22 NOTE — Telephone Encounter (Signed)
Attempted to call pt to do TOC, pt did not answer and VM is full not able to leave a message

## 2021-06-23 DIAGNOSIS — I130 Hypertensive heart and chronic kidney disease with heart failure and stage 1 through stage 4 chronic kidney disease, or unspecified chronic kidney disease: Secondary | ICD-10-CM | POA: Diagnosis not present

## 2021-06-23 DIAGNOSIS — N183 Chronic kidney disease, stage 3 unspecified: Secondary | ICD-10-CM | POA: Diagnosis not present

## 2021-06-23 DIAGNOSIS — I25118 Atherosclerotic heart disease of native coronary artery with other forms of angina pectoris: Secondary | ICD-10-CM | POA: Diagnosis not present

## 2021-06-23 DIAGNOSIS — E440 Moderate protein-calorie malnutrition: Secondary | ICD-10-CM | POA: Diagnosis not present

## 2021-06-23 DIAGNOSIS — I5043 Acute on chronic combined systolic (congestive) and diastolic (congestive) heart failure: Secondary | ICD-10-CM | POA: Diagnosis not present

## 2021-06-23 DIAGNOSIS — Z48812 Encounter for surgical aftercare following surgery on the circulatory system: Secondary | ICD-10-CM | POA: Diagnosis not present

## 2021-06-26 ENCOUNTER — Ambulatory Visit: Payer: Medicare Other | Admitting: Student

## 2021-06-26 ENCOUNTER — Telehealth (HOSPITAL_COMMUNITY): Payer: Self-pay

## 2021-06-26 DIAGNOSIS — E440 Moderate protein-calorie malnutrition: Secondary | ICD-10-CM | POA: Diagnosis not present

## 2021-06-26 DIAGNOSIS — I25118 Atherosclerotic heart disease of native coronary artery with other forms of angina pectoris: Secondary | ICD-10-CM | POA: Diagnosis not present

## 2021-06-26 DIAGNOSIS — I5043 Acute on chronic combined systolic (congestive) and diastolic (congestive) heart failure: Secondary | ICD-10-CM | POA: Diagnosis not present

## 2021-06-26 DIAGNOSIS — Z48812 Encounter for surgical aftercare following surgery on the circulatory system: Secondary | ICD-10-CM | POA: Diagnosis not present

## 2021-06-26 DIAGNOSIS — N183 Chronic kidney disease, stage 3 unspecified: Secondary | ICD-10-CM | POA: Diagnosis not present

## 2021-06-26 DIAGNOSIS — I130 Hypertensive heart and chronic kidney disease with heart failure and stage 1 through stage 4 chronic kidney disease, or unspecified chronic kidney disease: Secondary | ICD-10-CM | POA: Diagnosis not present

## 2021-06-26 NOTE — Telephone Encounter (Signed)
Call attempted to confirm HV TOC appt 8/24 @ 9AM. HIPPA appropriate SMS text sent with callback number. VM box full.   Pricilla Holm, MSN, RN Heart Failure Nurse Navigator [phone]

## 2021-06-27 ENCOUNTER — Encounter (HOSPITAL_COMMUNITY): Payer: Medicare Other

## 2021-06-27 DIAGNOSIS — I25118 Atherosclerotic heart disease of native coronary artery with other forms of angina pectoris: Secondary | ICD-10-CM | POA: Diagnosis not present

## 2021-06-27 DIAGNOSIS — E440 Moderate protein-calorie malnutrition: Secondary | ICD-10-CM | POA: Diagnosis not present

## 2021-06-27 DIAGNOSIS — I5043 Acute on chronic combined systolic (congestive) and diastolic (congestive) heart failure: Secondary | ICD-10-CM | POA: Diagnosis not present

## 2021-06-27 DIAGNOSIS — N183 Chronic kidney disease, stage 3 unspecified: Secondary | ICD-10-CM | POA: Diagnosis not present

## 2021-06-27 DIAGNOSIS — I130 Hypertensive heart and chronic kidney disease with heart failure and stage 1 through stage 4 chronic kidney disease, or unspecified chronic kidney disease: Secondary | ICD-10-CM | POA: Diagnosis not present

## 2021-06-27 DIAGNOSIS — Z48812 Encounter for surgical aftercare following surgery on the circulatory system: Secondary | ICD-10-CM | POA: Diagnosis not present

## 2021-06-29 DIAGNOSIS — I5043 Acute on chronic combined systolic (congestive) and diastolic (congestive) heart failure: Secondary | ICD-10-CM | POA: Diagnosis not present

## 2021-06-29 DIAGNOSIS — E440 Moderate protein-calorie malnutrition: Secondary | ICD-10-CM | POA: Diagnosis not present

## 2021-06-29 DIAGNOSIS — I130 Hypertensive heart and chronic kidney disease with heart failure and stage 1 through stage 4 chronic kidney disease, or unspecified chronic kidney disease: Secondary | ICD-10-CM | POA: Diagnosis not present

## 2021-06-29 DIAGNOSIS — N183 Chronic kidney disease, stage 3 unspecified: Secondary | ICD-10-CM | POA: Diagnosis not present

## 2021-06-29 DIAGNOSIS — I25118 Atherosclerotic heart disease of native coronary artery with other forms of angina pectoris: Secondary | ICD-10-CM | POA: Diagnosis not present

## 2021-06-29 DIAGNOSIS — Z48812 Encounter for surgical aftercare following surgery on the circulatory system: Secondary | ICD-10-CM | POA: Diagnosis not present

## 2021-07-02 ENCOUNTER — Telehealth (HOSPITAL_COMMUNITY): Payer: Self-pay

## 2021-07-02 DIAGNOSIS — I5043 Acute on chronic combined systolic (congestive) and diastolic (congestive) heart failure: Secondary | ICD-10-CM | POA: Diagnosis not present

## 2021-07-02 DIAGNOSIS — I130 Hypertensive heart and chronic kidney disease with heart failure and stage 1 through stage 4 chronic kidney disease, or unspecified chronic kidney disease: Secondary | ICD-10-CM | POA: Diagnosis not present

## 2021-07-02 DIAGNOSIS — N183 Chronic kidney disease, stage 3 unspecified: Secondary | ICD-10-CM | POA: Diagnosis not present

## 2021-07-02 DIAGNOSIS — Z48812 Encounter for surgical aftercare following surgery on the circulatory system: Secondary | ICD-10-CM | POA: Diagnosis not present

## 2021-07-02 DIAGNOSIS — I25118 Atherosclerotic heart disease of native coronary artery with other forms of angina pectoris: Secondary | ICD-10-CM | POA: Diagnosis not present

## 2021-07-02 DIAGNOSIS — E440 Moderate protein-calorie malnutrition: Secondary | ICD-10-CM | POA: Diagnosis not present

## 2021-07-02 NOTE — Telephone Encounter (Signed)
Called to confirm Heart & Vascular Transitions of Care appointment at 3pm 8/30. Patient reminded to bring all medications and pill box organizer with them. Confirmed patient has transportation. Gave directions, instructed to utilize [place] parking.  Confirmed appointment prior to ending call.   Pricilla Holm, MSN, RN Heart Failure Nurse Navigator [phone]

## 2021-07-03 ENCOUNTER — Encounter (HOSPITAL_COMMUNITY): Payer: Medicare Other

## 2021-07-03 DIAGNOSIS — Z48812 Encounter for surgical aftercare following surgery on the circulatory system: Secondary | ICD-10-CM | POA: Diagnosis not present

## 2021-07-03 DIAGNOSIS — I5043 Acute on chronic combined systolic (congestive) and diastolic (congestive) heart failure: Secondary | ICD-10-CM | POA: Diagnosis not present

## 2021-07-03 DIAGNOSIS — I25118 Atherosclerotic heart disease of native coronary artery with other forms of angina pectoris: Secondary | ICD-10-CM | POA: Diagnosis not present

## 2021-07-03 DIAGNOSIS — I130 Hypertensive heart and chronic kidney disease with heart failure and stage 1 through stage 4 chronic kidney disease, or unspecified chronic kidney disease: Secondary | ICD-10-CM | POA: Diagnosis not present

## 2021-07-03 DIAGNOSIS — E440 Moderate protein-calorie malnutrition: Secondary | ICD-10-CM | POA: Diagnosis not present

## 2021-07-03 DIAGNOSIS — N183 Chronic kidney disease, stage 3 unspecified: Secondary | ICD-10-CM | POA: Diagnosis not present

## 2021-07-04 ENCOUNTER — Other Ambulatory Visit: Payer: Self-pay

## 2021-07-04 ENCOUNTER — Ambulatory Visit: Payer: Medicare Other | Admitting: Cardiology

## 2021-07-04 ENCOUNTER — Encounter: Payer: Self-pay | Admitting: Cardiology

## 2021-07-04 VITALS — BP 98/53 | HR 63 | Temp 98.0°F | Resp 16 | Ht 64.0 in

## 2021-07-04 DIAGNOSIS — I480 Paroxysmal atrial fibrillation: Secondary | ICD-10-CM

## 2021-07-04 DIAGNOSIS — I5023 Acute on chronic systolic (congestive) heart failure: Secondary | ICD-10-CM | POA: Diagnosis not present

## 2021-07-04 MED ORDER — BUMETANIDE 1 MG PO TABS: 1.0000 mg | ORAL_TABLET | Freq: Two times a day (BID) | ORAL | 2 refills | Status: DC

## 2021-07-04 NOTE — Progress Notes (Signed)
Subjective:   Holly [name], female    DOB: 11-Jan-1941, 80 y.o.   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  Since discharge, patient continues to have several episodes during the day where she "just does not feel good".  Leg swelling has returned.  Physical activity is very limited due to musculoskeletal issues, with shortness of breath.  Patient has a nurse come visit her home twice a week.  I do not know if this is a palliative care nurse or home health nurse.    Current Outpatient Medications on File Prior to Visit  Medication Sig Dispense Refill   ALPRAZolam (XANAX) 0.25 MG tablet Take 1 tablet by mouth daily as needed for anxiety.      apixaban (ELIQUIS) 2.5 MG TABS tablet Take 1 tablet (2.5 mg total) by mouth 2 (two) times daily. 60 tablet 6   Ascorbic Acid (VITAMIN C) 1000 MG tablet Take 1,000 mg by mouth daily.     Biotin 5000 MCG CAPS Take 1 capsule by mouth daily.     clobetasol ointment (TEMOVATE) 8.27 % Apply 1 application topically 2 (two) times daily as needed (itching).   1   clotrimazole-betamethasone (LOTRISONE) cream Apply 1 application topically in the morning and at bedtime.     EUCRISA 2 % OINT Apply 1 application topically daily as needed (dry skin).      isosorbide-hydrALAZINE (BIDIL) 20-37.5 MG tablet Take 1 tablet by mouth 2 (two) times daily. 60 tablet 0   metoprolol succinate (TOPROL-XL) 50 MG 24 hr tablet Take 0.5 tablets (25 mg total) by mouth daily. Take with or immediately following a meal. 15 tablet 3   nitroGLYCERIN (NITROSTAT) 0.4 MG SL tablet Place 1 tablet (0.4 mg total) under the tongue every 5 (five) minutes as needed for  chest pain. 90 tablet 3   OLOPATADINE HCL OP Place 1 drop into both eyes 2 (two) times daily.     pantoprazole (PROTONIX) 40 MG tablet Take 40 mg by mouth daily as needed (heartburn).     potassium chloride (KLOR-CON) 10 MEQ tablet Take 1 tablet (10 mEq total) by mouth daily. 30 tablet 0   rosuvastatin (CRESTOR) 10 MG tablet TAKE 1 TABLET BY MOUTH EVERY DAY (Patient taking differently: Take 10 mg by mouth every evening.) 90 tablet 2   torsemide (DEMADEX) 20 MG tablet Take 1 tablet (20 mg total) by mouth 2 (two) times daily.     triamcinolone cream (KENALOG) 0.1 % Apply topically 2 (two) times daily as needed (rash).     Vitamin D, Ergocalciferol, 50 MCG (2000 UT) CAPS Take 1 capsule by mouth daily.     No current facility-administered medications on file prior to visit.    Cardiovascular & other pertient studies:  EKG 07/04/2021: Ventricualr paced rhythm 63 bpm  Chest x-ray 06/15/2021: There is a multi lead AICD. Mild atelectasis and/or infiltrate is seen within the suprahilar region on the right. There is no evidence of a pleural effusion or pneumothorax. The cardiac silhouette is enlarged and unchanged in size. There is mild calcification of the aortic arch. There is levoscoliosis of the thoracic spine with multilevel degenerative changes. A chronic deformity of the  left humeral head is noted.  EKG 06/14/2021: Ventricular paced rhythm  BiV upgrade 04/05/2021: 1.  Left upper extremity venography.  2.  Existing dual-chamber pacemaker generator removal  3.  Coronary sinus lead addition  EKG 03/27/2021: AV dual paced rhythm  Echocardiogram 03/27/2021: 1. Left ventricular ejection fraction, by estimation, is <20%. The left  ventricle has severely decreased function. The left ventricle demonstrates  global hypokinesis. There is mild left ventricular hypertrophy. Left  ventricular diastolic parameters are  indeterminate.   2. Right ventricular systolic function is normal. The right  ventricular  size is normal.   3. Left atrial size was severely dilated.   4. Right atrial size was severely dilated.   5. The mitral valve is normal in structure. Severe mitral valve  regurgitation. No evidence of mitral stenosis.   6. Tricuspid valve regurgitation is severe.   7. The aortic valve is normal in structure. There is mild calcification  of the aortic valve. There is mild thickening of the aortic valve. Aortic  valve regurgitation is trivial. Mild to moderate aortic valve  sclerosis/calcification is present, without  any evidence of aortic stenosis.   8. The inferior vena cava is dilated in size with <50% respiratory  variability, suggesting right atrial pressure of 15 mmHg.  Lexiscan myoview stress test 08/04/2017:  1. Non-diagnostic due to paced rhythm. 2. Study quality: good. The left ventricular chamber dimensions are normal. There is medium scar of the inferolateral segment with moderate to severe residual ischemia. There is small scar of the inferior segment with very mild residual ischemia. 3. Systolic function is mildly reduced. The calculated stress EF is at 44 %. Gated SPECT imaging demonstrates hypokinesis in the inferior, lateral segment(s). 5. Intermediate risk study. Clinical correlation recommended.   Coronary angiogram 11/11/2017: LM: Normal LAD: D1 40% stneoses LCx: Tandem 50% stenoses in mid LCx RCA: Normal   Moderate coronary artery disease. She does have abnormal stress test in inferolateral territory, they are out of proportion to the coronary artery findings. Furthermore, she does not have any critical unstable lesion, to explain nonsustained ventricular tachycardia. She does not have any ongoing chest pain symptoms at this time.Recommend aggressive medical management at this time. If true anginal symptoms occur, could then perform PCI to mid circumflex.   Pacemaker Implant 07/16/2017:  Medtronic Azure XT DR MRI Sure Scan (serial number [id number]) dual  chamber pacemaker 07/16/2017 by [facility] for complete heart block.    Recent labs: 06/15/2021: BNP >5000 Glucose 112, BUN 72, creatinine 1.94, GFR 26, sodium 139, potassium 4.5 Hgb 11.9, HCT 37.1, MCV 82, platelet 202 Urinalysis positive for bacteria (many)  06/04/2021: Glucose 94, BUN/Cr 39/1.7. EGFR 30. Chol 84, TG 51, HDL 43, LDL 64  04/08/2021: Glucose 94, BUN/Cr 39/1.76. EGFR 29. Na/K 132/4.6.  H/H 10/31. MCV 87. Platelets 180  10/2019: Chol 145, TG 49, HDL 76, LDL 58   Review of Systems  Constitutional: Positive for malaise/fatigue.  Cardiovascular:  Positive for dyspnea on exertion and leg swelling (worsened). Negative for chest pain, palpitations and syncope.  Respiratory:  Positive for shortness of breath.   Skin:           Psychiatric/Behavioral:  Positive for hallucinations.         Vitals:   07/04/21 1151  BP: (!) 98/53  Pulse: 63  Resp: 16  Temp: 98 F (36.7 C)  SpO2: 99%     Body mass index is 22.33 kg/m. There were no vitals filed for this visit.  Objective:   Physical Exam Vitals and nursing note reviewed.  Constitutional:      Comments: Appears fatigued  Neck:     Vascular: JVD present.  Cardiovascular:     Rate and Rhythm: Normal rate and regular rhythm.     Heart sounds: Normal heart sounds. No murmur heard. Pulmonary:     Effort: Tachypnea present. No respiratory distress.     Breath sounds: Normal breath sounds. No wheezing or rales.  Musculoskeletal:     Right lower leg: Edema (2+ pitting) present.     Left lower leg: Edema (2+ pitting) present.      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  This may be limited by her blood pressure.  Nonetheless, I  have changed her torsemide to Bumex p.o. 1 mg twice daily.  Again, we discussed the goals of care.  Usual goal remains reducing and preventing hospitalization.  Long-term goals will need more discussion between patient and family.  I do think palliative care or hospice will be a reasonable option, if patient is agreeable.  In the interim, we will continue outpatient close monitoring to reduce hospitalization.  PAF: Continue eliquis 2.5 mg bid   [employer], PA-C 07/04/2021, 11:47 AM Office: [phone]

## 2021-07-05 ENCOUNTER — Ambulatory Visit: Payer: Medicare Other | Admitting: Cardiology

## 2021-07-06 ENCOUNTER — Telehealth: Payer: Self-pay

## 2021-07-06 DIAGNOSIS — I130 Hypertensive heart and chronic kidney disease with heart failure and stage 1 through stage 4 chronic kidney disease, or unspecified chronic kidney disease: Secondary | ICD-10-CM | POA: Diagnosis not present

## 2021-07-06 DIAGNOSIS — I25118 Atherosclerotic heart disease of native coronary artery with other forms of angina pectoris: Secondary | ICD-10-CM | POA: Diagnosis not present

## 2021-07-06 DIAGNOSIS — N183 Chronic kidney disease, stage 3 unspecified: Secondary | ICD-10-CM | POA: Diagnosis not present

## 2021-07-06 DIAGNOSIS — E440 Moderate protein-calorie malnutrition: Secondary | ICD-10-CM | POA: Diagnosis not present

## 2021-07-06 DIAGNOSIS — I5043 Acute on chronic combined systolic (congestive) and diastolic (congestive) heart failure: Secondary | ICD-10-CM | POA: Diagnosis not present

## 2021-07-06 DIAGNOSIS — Z48812 Encounter for surgical aftercare following surgery on the circulatory system: Secondary | ICD-10-CM | POA: Diagnosis not present

## 2021-07-06 NOTE — Telephone Encounter (Signed)
Spoke with patient regarding scheduling a Palliative Care consult. She requested that I call her daughters.   Spoke with patient's daughter Ivin Booty and scheduled an in-person Palliative Consult for 08/06/21 @ 12:30 PM.   COVID screening was negative. No pets in home. Patient lives with son and niece.  Consent obtained; updated Outlook/Netsmart/Team List and Epic.   Family is aware they may be receiving a call from provider the day before or day of to confirm appointment.

## 2021-07-06 NOTE — Telephone Encounter (Signed)
Holly [name] spoke with the homehealth care lady.

## 2021-07-06 NOTE — Telephone Encounter (Signed)
Has she been getting Bumex 1 mg PO Bid? If not diuresing well and symptoms no better, may need to go back to hospital.   Thanks MJP

## 2021-07-09 DIAGNOSIS — I5021 Acute systolic (congestive) heart failure: Secondary | ICD-10-CM | POA: Diagnosis not present

## 2021-07-09 DIAGNOSIS — M150 Primary generalized (osteo)arthritis: Secondary | ICD-10-CM | POA: Diagnosis not present

## 2021-07-09 DIAGNOSIS — R269 Unspecified abnormalities of gait and mobility: Secondary | ICD-10-CM | POA: Diagnosis not present

## 2021-07-10 ENCOUNTER — Telehealth: Payer: Self-pay

## 2021-07-10 DIAGNOSIS — Z48812 Encounter for surgical aftercare following surgery on the circulatory system: Secondary | ICD-10-CM | POA: Diagnosis not present

## 2021-07-10 DIAGNOSIS — E440 Moderate protein-calorie malnutrition: Secondary | ICD-10-CM | POA: Diagnosis not present

## 2021-07-10 DIAGNOSIS — N183 Chronic kidney disease, stage 3 unspecified: Secondary | ICD-10-CM | POA: Diagnosis not present

## 2021-07-10 DIAGNOSIS — I25118 Atherosclerotic heart disease of native coronary artery with other forms of angina pectoris: Secondary | ICD-10-CM | POA: Diagnosis not present

## 2021-07-10 DIAGNOSIS — I130 Hypertensive heart and chronic kidney disease with heart failure and stage 1 through stage 4 chronic kidney disease, or unspecified chronic kidney disease: Secondary | ICD-10-CM | POA: Diagnosis not present

## 2021-07-10 DIAGNOSIS — I5043 Acute on chronic combined systolic (congestive) and diastolic (congestive) heart failure: Secondary | ICD-10-CM | POA: Diagnosis not present

## 2021-07-11 ENCOUNTER — Other Ambulatory Visit: Payer: Self-pay | Admitting: Cardiology

## 2021-07-11 DIAGNOSIS — N183 Chronic kidney disease, stage 3 unspecified: Secondary | ICD-10-CM | POA: Diagnosis not present

## 2021-07-11 DIAGNOSIS — I5023 Acute on chronic systolic (congestive) heart failure: Secondary | ICD-10-CM

## 2021-07-11 DIAGNOSIS — I130 Hypertensive heart and chronic kidney disease with heart failure and stage 1 through stage 4 chronic kidney disease, or unspecified chronic kidney disease: Secondary | ICD-10-CM | POA: Diagnosis not present

## 2021-07-11 DIAGNOSIS — I5043 Acute on chronic combined systolic (congestive) and diastolic (congestive) heart failure: Secondary | ICD-10-CM | POA: Diagnosis not present

## 2021-07-11 DIAGNOSIS — I25118 Atherosclerotic heart disease of native coronary artery with other forms of angina pectoris: Secondary | ICD-10-CM | POA: Diagnosis not present

## 2021-07-11 DIAGNOSIS — Z48812 Encounter for surgical aftercare following surgery on the circulatory system: Secondary | ICD-10-CM | POA: Diagnosis not present

## 2021-07-11 DIAGNOSIS — E440 Moderate protein-calorie malnutrition: Secondary | ICD-10-CM | POA: Diagnosis not present

## 2021-07-12 ENCOUNTER — Other Ambulatory Visit: Payer: Self-pay

## 2021-07-12 ENCOUNTER — Ambulatory Visit: Payer: Medicare Other | Admitting: Student

## 2021-07-12 ENCOUNTER — Encounter: Payer: Self-pay | Admitting: Student

## 2021-07-12 VITALS — BP 118/61 | HR 64 | Temp 98.0°F | Resp 16 | Ht 64.0 in | Wt 137.0 lb

## 2021-07-12 DIAGNOSIS — E440 Moderate protein-calorie malnutrition: Secondary | ICD-10-CM | POA: Diagnosis not present

## 2021-07-12 DIAGNOSIS — N183 Chronic kidney disease, stage 3 unspecified: Secondary | ICD-10-CM | POA: Diagnosis not present

## 2021-07-12 DIAGNOSIS — I5043 Acute on chronic combined systolic (congestive) and diastolic (congestive) heart failure: Secondary | ICD-10-CM | POA: Diagnosis not present

## 2021-07-12 DIAGNOSIS — I130 Hypertensive heart and chronic kidney disease with heart failure and stage 1 through stage 4 chronic kidney disease, or unspecified chronic kidney disease: Secondary | ICD-10-CM | POA: Diagnosis not present

## 2021-07-12 DIAGNOSIS — I25118 Atherosclerotic heart disease of native coronary artery with other forms of angina pectoris: Secondary | ICD-10-CM | POA: Diagnosis not present

## 2021-07-12 DIAGNOSIS — I5023 Acute on chronic systolic (congestive) heart failure: Secondary | ICD-10-CM

## 2021-07-12 DIAGNOSIS — Z48812 Encounter for surgical aftercare following surgery on the circulatory system: Secondary | ICD-10-CM | POA: Diagnosis not present

## 2021-07-12 NOTE — Progress Notes (Signed)
Subjective:   Holly [name], female    DOB: May 24, 1941, 80 y.o.   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  Therefore Dr. Randa Lynn switch patient from torsemide to Bumex 1 g twice daily.  Patient now presents for follow-up.  She states she is feeling better compared to last week with improvement of dyspnea as well as leg swelling.  She does have a home health nurse who comes twice weekly.  Since last office visit patient and family have been open to palliative care consult, referral has been sent and they are scheduled for visit 08/06/2021.  Current Outpatient Medications on File Prior to Visit  Medication Sig Dispense Refill   ALPRAZolam (XANAX) 0.25 MG tablet Take 1 tablet by mouth daily as needed for anxiety.      apixaban (ELIQUIS) 2.5 MG TABS tablet Take 1 tablet (2.5 mg total) by mouth 2 (two) times daily. 60 tablet 6   Ascorbic Acid (VITAMIN C) 1000 MG tablet Take 1,000 mg by mouth daily.     Biotin 5000 MCG CAPS Take 1 capsule by mouth daily.     bumetanide (BUMEX) 1 MG tablet TAKE 1 TABLET BY MOUTH TWICE A DAY 30 tablet 2   clobetasol ointment (TEMOVATE) 9.45 % Apply 1 application topically 2 (two) times daily as needed (itching).   1   clotrimazole-betamethasone (LOTRISONE) cream Apply 1 application topically in the morning and at bedtime.     EUCRISA 2 % OINT Apply 1 application topically daily as needed (dry skin).      fexofenadine (ALLEGRA) 180 MG tablet Take 180 mg by mouth daily.     isosorbide-hydrALAZINE (BIDIL) 20-37.5 MG tablet Take 1 tablet by mouth 2 (two) times daily. 60 tablet 0    metoprolol succinate (TOPROL-XL) 50 MG 24 hr tablet Take 0.5 tablets (25 mg total) by mouth daily. Take with or immediately following a meal. 15 tablet 3   OLOPATADINE HCL OP Place 1 drop into both eyes 2 (two) times daily.     pantoprazole (PROTONIX) 40 MG tablet Take 40 mg by mouth daily as needed (heartburn).     potassium chloride (KLOR-CON) 10 MEQ tablet Take 1 tablet (10 mEq total) by mouth daily. 30 tablet 0   rosuvastatin (CRESTOR) 10 MG tablet TAKE 1 TABLET BY MOUTH EVERY DAY (Patient taking differently: Take 10 mg by mouth every evening.) 90 tablet 2   triamcinolone cream (KENALOG) 0.1 % Apply topically 2 (two) times daily as needed (rash).     Vitamin D, Ergocalciferol, 50 MCG (2000 UT) CAPS Take 1 capsule by mouth daily.     nitroGLYCERIN (NITROSTAT) 0.4 MG SL tablet Place 1 tablet (0.4 mg total) under the tongue every 5 (five) minutes as needed for chest pain. 90 tablet 3   No current facility-administered medications on file prior to visit.    Cardiovascular & other pertient studies:  EKG 07/04/2021: Ventricualr paced rhythm 63 bpm  Chest x-ray 06/15/2021: There is a multi lead AICD. Mild atelectasis and/or infiltrate is seen within the suprahilar region on the right. There is no evidence of  a pleural effusion or pneumothorax. The cardiac silhouette is enlarged and unchanged in size. There is mild calcification of the aortic arch. There is levoscoliosis of the thoracic spine with multilevel degenerative changes. A chronic deformity of the left humeral head is noted.  EKG 06/14/2021: Ventricular paced rhythm  BiV upgrade 04/05/2021: 1.  Left upper extremity venography.  2.  Existing dual-chamber pacemaker generator removal  3.  Coronary sinus lead addition  EKG 03/27/2021: AV dual paced rhythm  Echocardiogram 03/27/2021: 1. Left ventricular ejection fraction, by estimation, is <20%. The left  ventricle has severely decreased function. The left ventricle demonstrates  global  hypokinesis. There is mild left ventricular hypertrophy. Left  ventricular diastolic parameters are  indeterminate.   2. Right ventricular systolic function is normal. The right ventricular  size is normal.   3. Left atrial size was severely dilated.   4. Right atrial size was severely dilated.   5. The mitral valve is normal in structure. Severe mitral valve  regurgitation. No evidence of mitral stenosis.   6. Tricuspid valve regurgitation is severe.   7. The aortic valve is normal in structure. There is mild calcification  of the aortic valve. There is mild thickening of the aortic valve. Aortic  valve regurgitation is trivial. Mild to moderate aortic valve  sclerosis/calcification is present, without  any evidence of aortic stenosis.   8. The inferior vena cava is dilated in size with <50% respiratory  variability, suggesting right atrial pressure of 15 mmHg.  Lexiscan myoview stress test 08/04/2017:  1. Non-diagnostic due to paced rhythm. 2. Study quality: good. The left ventricular chamber dimensions are normal. There is medium scar of the inferolateral segment with moderate to severe residual ischemia. There is small scar of the inferior segment with very mild residual ischemia. 3. Systolic function is mildly reduced. The calculated stress EF is at 44 %. Gated SPECT imaging demonstrates hypokinesis in the inferior, lateral segment(s). 5. Intermediate risk study. Clinical correlation recommended.   Coronary angiogram 11/11/2017: LM: Normal LAD: D1 40% stneoses LCx: Tandem 50% stenoses in mid LCx RCA: Normal   Moderate coronary artery disease. She does have abnormal stress test in inferolateral territory, they are out of proportion to the coronary artery findings. Furthermore, she does not have any critical unstable lesion, to explain nonsustained ventricular tachycardia. She does not have any ongoing chest pain symptoms at this time.Recommend aggressive medical management at this time.  If true anginal symptoms occur, could then perform PCI to mid circumflex.   Pacemaker Implant 07/16/2017:  Medtronic Azure XT DR MRI Sure Scan (serial number [id number]) dual chamber pacemaker 07/16/2017 by [facility] for complete heart block.    Recent labs: 06/15/2021: BNP >5000 Glucose 112, BUN 72, creatinine 1.94, GFR 26, sodium 139, potassium 4.5 Hgb 11.9, HCT 37.1, MCV 82, platelet 202 Urinalysis positive for bacteria (many)  06/04/2021: Glucose 94, BUN/Cr 39/1.7. EGFR 30. Chol 84, TG 51, HDL 43, LDL 64  04/08/2021: Glucose 94, BUN/Cr 39/1.76. EGFR 29. Na/K 132/4.6.  H/H 10/31. MCV 87. Platelets 180  10/2019: Chol 145, TG 49, HDL 76, LDL 58   Review of Systems  Constitutional: Positive for malaise/fatigue.  Cardiovascular:  Positive for dyspnea on exertion (improved) and leg swelling (improved). Negative for chest pain, palpitations and syncope.  Respiratory:  Positive for shortness of breath (improved).   Skin:           Psychiatric/Behavioral:  Positive for hallucinations.         Vitals:  07/12/21 1034  BP: 118/61  Pulse: 64  Resp: 16  Temp: 98 F (36.7 C)  SpO2: 98%     Body mass index is 23.52 kg/m. Filed Weights   07/12/21 1034  Weight: 137 lb (62.1 kg)      Objective:   Physical Exam Vitals and nursing note reviewed.  Constitutional:      Comments: Appears fatigued  Neck:     Vascular: JVD present.  Cardiovascular:     Rate and Rhythm: Normal rate and regular rhythm.     Heart sounds: Normal heart sounds. No murmur heard. Pulmonary:     Effort: Tachypnea present. No respiratory distress.     Breath sounds: Wheezing present. No rales.  Musculoskeletal:     Right lower leg: Edema (1+pitting) present.     Left lower leg: Edema (1+ pitting) present.  Neurological:     Mental Status: She is alert.      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  She continues to have bilateral leg swelling on exam which has improved.  We will continue Bumex p.o. 1 g twice daily.  Patient will have palliative care consult 08/06/2021.  Present goal continues to be reduced hospitalization. Patient will have labs done by home health tomorrow, have requested that results be sent to our office given switch from torsemide to Bumex.  PAF: Continue eliquis 2.5 mg bid  Follow-up in 4 weeks, sooner if needed, for heart failure.  Patient was seen in collaboration with Dr. Virgina Jock. He also reviewed patient's chart and examined the patient. Dr. Virgina Jock is in agreement of the plan.     Alethia Berthold, PA-C 07/12/2021, 12:11 PM Office: [phone]

## 2021-07-13 ENCOUNTER — Telehealth: Payer: Self-pay | Admitting: Student

## 2021-07-13 DIAGNOSIS — I25118 Atherosclerotic heart disease of native coronary artery with other forms of angina pectoris: Secondary | ICD-10-CM | POA: Diagnosis not present

## 2021-07-13 DIAGNOSIS — I5043 Acute on chronic combined systolic (congestive) and diastolic (congestive) heart failure: Secondary | ICD-10-CM | POA: Diagnosis not present

## 2021-07-13 DIAGNOSIS — E440 Moderate protein-calorie malnutrition: Secondary | ICD-10-CM | POA: Diagnosis not present

## 2021-07-13 DIAGNOSIS — N183 Chronic kidney disease, stage 3 unspecified: Secondary | ICD-10-CM | POA: Diagnosis not present

## 2021-07-13 DIAGNOSIS — Z48812 Encounter for surgical aftercare following surgery on the circulatory system: Secondary | ICD-10-CM | POA: Diagnosis not present

## 2021-07-13 DIAGNOSIS — I130 Hypertensive heart and chronic kidney disease with heart failure and stage 1 through stage 4 chronic kidney disease, or unspecified chronic kidney disease: Secondary | ICD-10-CM | POA: Diagnosis not present

## 2021-07-13 NOTE — Telephone Encounter (Signed)
Elmyra Ricks w/[facility] calling to have patient's potassium refilled. Also wants to make med assistant aware patient is having expiratory wheezing.

## 2021-07-17 DIAGNOSIS — Z23 Encounter for immunization: Secondary | ICD-10-CM | POA: Diagnosis not present

## 2021-07-17 DIAGNOSIS — R6881 Early satiety: Secondary | ICD-10-CM | POA: Diagnosis not present

## 2021-07-17 DIAGNOSIS — R1010 Upper abdominal pain, unspecified: Secondary | ICD-10-CM | POA: Diagnosis not present

## 2021-07-17 DIAGNOSIS — R300 Dysuria: Secondary | ICD-10-CM | POA: Diagnosis not present

## 2021-07-17 DIAGNOSIS — K5901 Slow transit constipation: Secondary | ICD-10-CM | POA: Diagnosis not present

## 2021-07-18 DIAGNOSIS — I5043 Acute on chronic combined systolic (congestive) and diastolic (congestive) heart failure: Secondary | ICD-10-CM | POA: Diagnosis not present

## 2021-07-18 DIAGNOSIS — E440 Moderate protein-calorie malnutrition: Secondary | ICD-10-CM | POA: Diagnosis not present

## 2021-07-18 DIAGNOSIS — I130 Hypertensive heart and chronic kidney disease with heart failure and stage 1 through stage 4 chronic kidney disease, or unspecified chronic kidney disease: Secondary | ICD-10-CM | POA: Diagnosis not present

## 2021-07-18 DIAGNOSIS — Z48812 Encounter for surgical aftercare following surgery on the circulatory system: Secondary | ICD-10-CM | POA: Diagnosis not present

## 2021-07-18 DIAGNOSIS — N183 Chronic kidney disease, stage 3 unspecified: Secondary | ICD-10-CM | POA: Diagnosis not present

## 2021-07-18 DIAGNOSIS — I25118 Atherosclerotic heart disease of native coronary artery with other forms of angina pectoris: Secondary | ICD-10-CM | POA: Diagnosis not present

## 2021-07-19 DIAGNOSIS — N183 Chronic kidney disease, stage 3 unspecified: Secondary | ICD-10-CM | POA: Diagnosis not present

## 2021-07-19 DIAGNOSIS — Z48812 Encounter for surgical aftercare following surgery on the circulatory system: Secondary | ICD-10-CM | POA: Diagnosis not present

## 2021-07-19 DIAGNOSIS — E440 Moderate protein-calorie malnutrition: Secondary | ICD-10-CM | POA: Diagnosis not present

## 2021-07-19 DIAGNOSIS — I25118 Atherosclerotic heart disease of native coronary artery with other forms of angina pectoris: Secondary | ICD-10-CM | POA: Diagnosis not present

## 2021-07-19 DIAGNOSIS — I130 Hypertensive heart and chronic kidney disease with heart failure and stage 1 through stage 4 chronic kidney disease, or unspecified chronic kidney disease: Secondary | ICD-10-CM | POA: Diagnosis not present

## 2021-07-19 DIAGNOSIS — I5043 Acute on chronic combined systolic (congestive) and diastolic (congestive) heart failure: Secondary | ICD-10-CM | POA: Diagnosis not present

## 2021-07-20 ENCOUNTER — Other Ambulatory Visit: Payer: Self-pay

## 2021-07-20 ENCOUNTER — Telehealth: Payer: Self-pay | Admitting: Cardiology

## 2021-07-20 DIAGNOSIS — I5043 Acute on chronic combined systolic (congestive) and diastolic (congestive) heart failure: Secondary | ICD-10-CM | POA: Diagnosis not present

## 2021-07-20 DIAGNOSIS — N183 Chronic kidney disease, stage 3 unspecified: Secondary | ICD-10-CM | POA: Diagnosis not present

## 2021-07-20 DIAGNOSIS — I25118 Atherosclerotic heart disease of native coronary artery with other forms of angina pectoris: Secondary | ICD-10-CM | POA: Diagnosis not present

## 2021-07-20 DIAGNOSIS — E440 Moderate protein-calorie malnutrition: Secondary | ICD-10-CM | POA: Diagnosis not present

## 2021-07-20 DIAGNOSIS — I130 Hypertensive heart and chronic kidney disease with heart failure and stage 1 through stage 4 chronic kidney disease, or unspecified chronic kidney disease: Secondary | ICD-10-CM | POA: Diagnosis not present

## 2021-07-20 DIAGNOSIS — Z48812 Encounter for surgical aftercare following surgery on the circulatory system: Secondary | ICD-10-CM | POA: Diagnosis not present

## 2021-07-20 MED ORDER — ISOSORB DINITRATE-HYDRALAZINE 20-37.5 MG PO TABS: 1.0000 | ORAL_TABLET | Freq: Two times a day (BID) | ORAL | 3 refills | Status: DC

## 2021-07-20 NOTE — Telephone Encounter (Signed)
Anissa w/[facility] requesting refill for isosorbide-hydralazine for this patient.

## 2021-07-23 ENCOUNTER — Other Ambulatory Visit: Payer: Self-pay | Admitting: Internal Medicine

## 2021-07-23 DIAGNOSIS — R1010 Upper abdominal pain, unspecified: Secondary | ICD-10-CM

## 2021-07-24 ENCOUNTER — Encounter (HOSPITAL_COMMUNITY): Payer: Self-pay

## 2021-07-24 ENCOUNTER — Encounter: Payer: Medicare Other | Admitting: Cardiology

## 2021-07-24 ENCOUNTER — Emergency Department (HOSPITAL_COMMUNITY): Payer: Medicare Other

## 2021-07-24 ENCOUNTER — Other Ambulatory Visit: Payer: Self-pay

## 2021-07-24 ENCOUNTER — Inpatient Hospital Stay (HOSPITAL_COMMUNITY)
Admission: EM | Admit: 2021-07-24 | Discharge: 2021-07-28 | DRG: 291 | Disposition: A | Discharge status: Hospice | Payer: Medicare Other | Attending: Internal Medicine | Admitting: Internal Medicine

## 2021-07-24 ENCOUNTER — Telehealth: Payer: Self-pay

## 2021-07-24 DIAGNOSIS — I110 Hypertensive heart disease with heart failure: Secondary | ICD-10-CM | POA: Diagnosis not present

## 2021-07-24 DIAGNOSIS — Z950 Presence of cardiac pacemaker: Secondary | ICD-10-CM | POA: Diagnosis not present

## 2021-07-24 DIAGNOSIS — Z8249 Family history of ischemic heart disease and other diseases of the circulatory system: Secondary | ICD-10-CM

## 2021-07-24 DIAGNOSIS — N184 Chronic kidney disease, stage 4 (severe): Secondary | ICD-10-CM | POA: Diagnosis present

## 2021-07-24 DIAGNOSIS — I130 Hypertensive heart and chronic kidney disease with heart failure and stage 1 through stage 4 chronic kidney disease, or unspecified chronic kidney disease: Principal | ICD-10-CM | POA: Diagnosis present

## 2021-07-24 DIAGNOSIS — K5730 Diverticulosis of large intestine without perforation or abscess without bleeding: Secondary | ICD-10-CM | POA: Diagnosis not present

## 2021-07-24 DIAGNOSIS — Z7901 Long term (current) use of anticoagulants: Secondary | ICD-10-CM

## 2021-07-24 DIAGNOSIS — Z20822 Contact with and (suspected) exposure to covid-19: Secondary | ICD-10-CM | POA: Diagnosis present

## 2021-07-24 DIAGNOSIS — Z95810 Presence of automatic (implantable) cardiac defibrillator: Secondary | ICD-10-CM

## 2021-07-24 DIAGNOSIS — Z515 Encounter for palliative care: Secondary | ICD-10-CM | POA: Diagnosis not present

## 2021-07-24 DIAGNOSIS — I472 Ventricular tachycardia: Secondary | ICD-10-CM | POA: Diagnosis not present

## 2021-07-24 DIAGNOSIS — I442 Atrioventricular block, complete: Secondary | ICD-10-CM | POA: Diagnosis not present

## 2021-07-24 DIAGNOSIS — I480 Paroxysmal atrial fibrillation: Secondary | ICD-10-CM | POA: Diagnosis present

## 2021-07-24 DIAGNOSIS — I5023 Acute on chronic systolic (congestive) heart failure: Secondary | ICD-10-CM | POA: Diagnosis not present

## 2021-07-24 DIAGNOSIS — R531 Weakness: Secondary | ICD-10-CM | POA: Diagnosis not present

## 2021-07-24 DIAGNOSIS — Z79899 Other long term (current) drug therapy: Secondary | ICD-10-CM

## 2021-07-24 DIAGNOSIS — N281 Cyst of kidney, acquired: Secondary | ICD-10-CM | POA: Diagnosis not present

## 2021-07-24 DIAGNOSIS — N179 Acute kidney failure, unspecified: Secondary | ICD-10-CM | POA: Diagnosis not present

## 2021-07-24 DIAGNOSIS — Z23 Encounter for immunization: Secondary | ICD-10-CM | POA: Diagnosis not present

## 2021-07-24 DIAGNOSIS — R609 Edema, unspecified: Secondary | ICD-10-CM | POA: Diagnosis not present

## 2021-07-24 DIAGNOSIS — R601 Generalized edema: Secondary | ICD-10-CM | POA: Diagnosis not present

## 2021-07-24 DIAGNOSIS — J9811 Atelectasis: Secondary | ICD-10-CM | POA: Diagnosis not present

## 2021-07-24 DIAGNOSIS — R002 Palpitations: Secondary | ICD-10-CM

## 2021-07-24 DIAGNOSIS — R0602 Shortness of breath: Secondary | ICD-10-CM | POA: Diagnosis not present

## 2021-07-24 DIAGNOSIS — J90 Pleural effusion, not elsewhere classified: Secondary | ICD-10-CM | POA: Diagnosis not present

## 2021-07-24 DIAGNOSIS — Z888 Allergy status to other drugs, medicaments and biological substances status: Secondary | ICD-10-CM | POA: Diagnosis not present

## 2021-07-24 DIAGNOSIS — Z743 Need for continuous supervision: Secondary | ICD-10-CM | POA: Diagnosis not present

## 2021-07-24 DIAGNOSIS — I499 Cardiac arrhythmia, unspecified: Secondary | ICD-10-CM | POA: Diagnosis not present

## 2021-07-24 DIAGNOSIS — I10 Essential (primary) hypertension: Secondary | ICD-10-CM | POA: Diagnosis not present

## 2021-07-24 DIAGNOSIS — J189 Pneumonia, unspecified organism: Secondary | ICD-10-CM

## 2021-07-24 DIAGNOSIS — G609 Hereditary and idiopathic neuropathy, unspecified: Secondary | ICD-10-CM | POA: Diagnosis present

## 2021-07-24 DIAGNOSIS — I2510 Atherosclerotic heart disease of native coronary artery without angina pectoris: Secondary | ICD-10-CM | POA: Diagnosis not present

## 2021-07-24 DIAGNOSIS — Z7189 Other specified counseling: Secondary | ICD-10-CM | POA: Diagnosis not present

## 2021-07-24 DIAGNOSIS — R1900 Intra-abdominal and pelvic swelling, mass and lump, unspecified site: Secondary | ICD-10-CM | POA: Diagnosis not present

## 2021-07-24 DIAGNOSIS — K8020 Calculus of gallbladder without cholecystitis without obstruction: Secondary | ICD-10-CM | POA: Diagnosis not present

## 2021-07-24 LAB — CBC
HCT: 36.5 % (ref 36.0–46.0)
Hemoglobin: 11.2 g/dL — ABNORMAL LOW (ref 12.0–15.0)
MCH: 25.1 pg — ABNORMAL LOW (ref 26.0–34.0)
MCHC: 30.7 g/dL (ref 30.0–36.0)
MCV: 81.7 fL (ref 80.0–100.0)
Platelets: 161 10*3/uL (ref 150–400)
RBC: 4.47 MIL/uL (ref 3.87–5.11)
RDW: 19.4 % — ABNORMAL HIGH (ref 11.5–15.5)
WBC: 4.2 10*3/uL (ref 4.0–10.5)
nRBC: 0 % (ref 0.0–0.2)

## 2021-07-24 LAB — LIPASE, BLOOD: Lipase: 35 U/L (ref 11–51)

## 2021-07-24 LAB — URINALYSIS, ROUTINE W REFLEX MICROSCOPIC
Bacteria, UA: NONE SEEN
Bilirubin Urine: NEGATIVE
Glucose, UA: NEGATIVE mg/dL
Hgb urine dipstick: NEGATIVE
Ketones, ur: NEGATIVE mg/dL
Leukocytes,Ua: NEGATIVE
Nitrite: NEGATIVE
Protein, ur: NEGATIVE mg/dL
Specific Gravity, Urine: 1.015 (ref 1.005–1.030)
pH: 6 (ref 5.0–8.0)

## 2021-07-24 LAB — RESP PANEL BY RT-PCR (FLU A&B, COVID) ARPGX2
Influenza A by PCR: NEGATIVE
Influenza B by PCR: NEGATIVE
SARS Coronavirus 2 by RT PCR: NEGATIVE

## 2021-07-24 LAB — COMPREHENSIVE METABOLIC PANEL
ALT: 10 U/L (ref 0–44)
AST: 37 U/L (ref 15–41)
Albumin: 3.3 g/dL — ABNORMAL LOW (ref 3.5–5.0)
Alkaline Phosphatase: 37 U/L — ABNORMAL LOW (ref 38–126)
Anion gap: 12 (ref 5–15)
BUN: 50 mg/dL — ABNORMAL HIGH (ref 8–23)
CO2: 20 mmol/L — ABNORMAL LOW (ref 22–32)
Calcium: 10.2 mg/dL (ref 8.9–10.3)
Chloride: 103 mmol/L (ref 98–111)
Creatinine, Ser: 2.3 mg/dL — ABNORMAL HIGH (ref 0.44–1.00)
GFR, Estimated: 21 mL/min — ABNORMAL LOW (ref >60)
Glucose, Bld: 113 mg/dL — ABNORMAL HIGH (ref 70–99)
Potassium: 4.8 mmol/L (ref 3.5–5.1)
Sodium: 135 mmol/L (ref 135–145)
Total Bilirubin: 1.3 mg/dL — ABNORMAL HIGH (ref 0.3–1.2)
Total Protein: 6.9 g/dL (ref 6.5–8.1)

## 2021-07-24 LAB — BRAIN NATRIURETIC PEPTIDE: B Natriuretic Peptide: >4500 pg/mL — ABNORMAL HIGH (ref 0.0–100.0)

## 2021-07-24 LAB — TROPONIN I (HIGH SENSITIVITY)
Troponin I (High Sensitivity): 37 ng/L — ABNORMAL HIGH (ref <18)
Troponin I (High Sensitivity): 37 ng/L — ABNORMAL HIGH (ref <18)

## 2021-07-24 MED ORDER — ISOSORB DINITRATE-HYDRALAZINE 20-37.5 MG PO TABS: 1.0000 | ORAL_TABLET | Freq: Two times a day (BID) | ORAL | Status: DC

## 2021-07-24 MED ORDER — FUROSEMIDE 10 MG/ML IJ SOLN
40.0000 mg | Freq: Two times a day (BID) | INTRAMUSCULAR | Status: DC
  Administered 2021-07-25: 40 mg via INTRAVENOUS
  Filled 2021-07-24: qty 4

## 2021-07-24 MED ORDER — ROSUVASTATIN CALCIUM 5 MG PO TABS
10.0000 mg | ORAL_TABLET | Freq: Every evening | ORAL | Status: DC
  Administered 2021-07-24 – 2021-07-27 (×4): 10 mg via ORAL
  Filled 2021-07-24 (×4): qty 2

## 2021-07-24 MED ORDER — ALPRAZOLAM 0.5 MG PO TABS: 0.2500 mg | ORAL_TABLET | Freq: Every day | ORAL | Status: DC | PRN

## 2021-07-24 MED ORDER — METOPROLOL SUCCINATE ER 25 MG PO TB24
25.0000 mg | ORAL_TABLET | Freq: Every day | ORAL | Status: DC
  Administered 2021-07-25 – 2021-07-28 (×4): 25 mg via ORAL
  Filled 2021-07-24 (×4): qty 1

## 2021-07-24 MED ORDER — ONDANSETRON HCL 4 MG PO TABS: 4.0000 mg | ORAL_TABLET | Freq: Four times a day (QID) | ORAL | Status: DC | PRN

## 2021-07-24 MED ORDER — ACETAMINOPHEN 650 MG RE SUPP: 650.0000 mg | Freq: Four times a day (QID) | RECTAL | Status: DC | PRN

## 2021-07-24 MED ORDER — SODIUM CHLORIDE 0.9 % IV SOLN
2.0000 g | INTRAVENOUS | Status: DC
  Administered 2021-07-24: 2 g via INTRAVENOUS
  Filled 2021-07-24: qty 20

## 2021-07-24 MED ORDER — ACETAMINOPHEN 325 MG PO TABS
650.0000 mg | ORAL_TABLET | Freq: Four times a day (QID) | ORAL | Status: DC | PRN
  Administered 2021-07-26 – 2021-07-27 (×2): 650 mg via ORAL
  Filled 2021-07-24 (×2): qty 2

## 2021-07-24 MED ORDER — APIXABAN 2.5 MG PO TABS
2.5000 mg | ORAL_TABLET | Freq: Two times a day (BID) | ORAL | Status: DC
  Administered 2021-07-24 – 2021-07-28 (×8): 2.5 mg via ORAL
  Filled 2021-07-24 (×8): qty 1

## 2021-07-24 MED ORDER — BUMETANIDE 0.25 MG/ML IJ SOLN
1.0000 mg | Freq: Once | INTRAMUSCULAR | Status: AC
  Administered 2021-07-24: 1 mg via INTRAVENOUS
  Filled 2021-07-24: qty 4

## 2021-07-24 MED ORDER — ONDANSETRON HCL 4 MG/2ML IJ SOLN: 4.0000 mg | Freq: Four times a day (QID) | INTRAMUSCULAR | Status: DC | PRN

## 2021-07-24 MED ORDER — SODIUM CHLORIDE 0.9 % IV SOLN
100.0000 mg | Freq: Two times a day (BID) | INTRAVENOUS | Status: DC
  Administered 2021-07-24: 100 mg via INTRAVENOUS
  Filled 2021-07-24 (×2): qty 100

## 2021-07-24 NOTE — ED Provider Notes (Signed)
[facility] EMERGENCY DEPARTMENT Provider Note   CSN: [id number] Arrival date & time: 07/24/21  [id number]     History Chief Complaint  Patient presents with   Edema    Holly [name] is a 80 y.o. female.  The history is provided by the patient and medical records. No language interpreter was used.  Abdominal Pain Pain location:  Generalized Pain quality: aching, cramping and squeezing   Pain radiates to:  Chest Pain severity:  Severe Onset quality:  Gradual Duration:  3 days (Discomfort and swelling on and off for 2 months but worsening over the last few days) Timing:  Constant Progression:  Worsening Chronicity:  New Worsened by:  Palpation and eating Ineffective treatments:  None tried Associated symptoms: chest pain, constipation, fatigue and shortness of breath   Associated symptoms: no anorexia, no chills, no cough, no diarrhea, no dysuria, no fever, no nausea and no vomiting       Past Medical History:  Diagnosis Date   AV block, 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[place]) 07/15/2017   Bronchitis    HFrEF (heart failure with reduced ejection fraction) ([place]) 04/21/2020   Hypertension    ICD: CRTD Medtronic Device Crtp Percepta Quad Mri 04/23/2021 04/23/2021        Patient Active Problem List   Diagnosis Date Noted   Elevated troponin    Acute on chronic systolic heart failure (HCC) 06/19/2021   Prolonged QT interval 06/19/2021   CKD (chronic kidney disease) stage 4, GFR 15-29 ml/min ([place]) 06/19/2021   Hallucination 06/14/2021   Precordial pain 04/26/2021   Pacemaker: CRT-P Medtronic Device Crtp Percepta Quad Mri 04/23/2021 04/23/2021   Weakness generalized    Palliative care by specialist    Malnutrition of moderate degree 03/29/2021   Cholelithiasis 03/27/2021   Congestive heart failure ([place]) 03/27/2021   AKI (acute kidney injury) ([place]) [id number]   Chronic systolic heart failure ([place]) 11/14/2020   Paroxysmal A-fib (HCC) 05/04/2020   HFrEF (heart failure with reduced  ejection fraction) ([place]) [id number]   Acute systolic heart failure (HCC) 04/10/2020   Nonrheumatic mitral valve regurgitation 04/10/2020   Elevated brain natriuretic peptide (BNP) level 03/30/2020   Leg edema 03/30/2020   Dyspnea on exertion 03/15/2020   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   NSVT (nonsustained ventricular tachycardia) ([place]) 09/23/2019   DNR (do not resuscitate) discussion 05/12/2019   Coronary artery disease of native artery of native heart with stable angina pectoris ([place]) 04/24/2019   Essential hypertension 04/24/2019   Abnormal stress test 11/08/2017   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[place]) 07/15/2017   Unspecified hereditary and idiopathic peripheral neuropathy 08/09/2013   Ulnar neuropathy 08/09/2013   Carpal tunnel syndrome 08/09/2013    Past Surgical History:  Procedure Laterality Date   BIV UPGRADE N/A 04/05/2021   Procedure: BIV UPGRADE;  Surgeon: Vickie Epley, MD;  Location: [facility];  Service: Cardiovascular;  Laterality: N/A;   LEFT HEART CATH AND CORONARY ANGIOGRAPHY N/A 11/11/2017   Procedure: LEFT HEART CATH AND CORONARY ANGIOGRAPHY;  Surgeon: Nigel Mormon, MD;  Location: [facility];  Service: Cardiovascular;  Laterality: N/A;   left leg surgery     PACEMAKER IMPLANT N/A 07/15/2017   Procedure: Pacemaker Implant;  Surgeon: Constance Haw, MD;  Location: [facility];  Service: Cardiovascular;  Laterality: N/A;   UPPER EXTREMITY VENOGRAPHY N/A 04/05/2021   Procedure: UPPER EXTREMITY VENOGRAPHY;  Surgeon: Vickie Epley, MD;  Location: [facility];  Service: Cardiovascular;  Laterality: N/A;     OB History   No obstetric history on file.     Family History  Problem Relation Age of Onset   Hypertension Mother    ALS Sister    Heart attack Brother    Diabetes Daughter     Social History   Tobacco Use   Smoking status: Never   Smokeless tobacco: Never  Vaping Use   Vaping Use: Never used   Substance Use Topics   Alcohol use: No   Drug use: Never    Home Medications Prior to Admission medications   Medication Sig Start Date End Date Taking? Authorizing Provider  ALPRAZolam Duanne Moron) 0.25 MG tablet Take 1 tablet by mouth daily as needed for anxiety.  01/14/19   [provider]  apixaban (ELIQUIS) 2.5 MG TABS tablet Take 1 tablet (2.5 mg total) by mouth 2 (two) times daily. 04/10/21   Charlynne Cousins, MD  Ascorbic Acid (VITAMIN C) 1000 MG tablet Take 1,000 mg by mouth daily.    [provider]  Biotin 5000 MCG CAPS Take 1 capsule by mouth daily.    [provider]  bumetanide (BUMEX) 1 MG tablet TAKE 1 TABLET BY MOUTH TWICE A DAY 07/11/21   Patwardhan, Manish J, MD  clobetasol ointment (TEMOVATE) [id number] % Apply 1 application topically 2 (two) times daily as needed (itching).  08/02/17   [provider]  clotrimazole-betamethasone (LOTRISONE) cream Apply 1 application topically in the morning and at bedtime. 01/16/17   [provider]  EUCRISA 2 % OINT Apply 1 application topically daily as needed (dry skin).  01/14/19   [provider]  fexofenadine (ALLEGRA) 180 MG tablet Take 180 mg by mouth daily.    [provider]  isosorbide-hydrALAZINE (BIDIL) 20-37.5 MG tablet Take 1 tablet by mouth 2 (two) times daily. 07/20/21 08/19/21  Patwardhan, Reynold Bowen, MD  metoprolol succinate (TOPROL-XL) 50 MG 24 hr tablet Take 0.5 tablets (25 mg total) by mouth daily. Take with or immediately following a meal. 04/08/21   Charlynne Cousins, MD  nitroGLYCERIN (NITROSTAT) 0.4 MG SL tablet Place 1 tablet (0.4 mg total) under the tongue every 5 (five) minutes as needed for chest pain. 09/23/19 07/04/21  Patwardhan, Reynold Bowen, MD  OLOPATADINE HCL OP Place 1 drop into both eyes 2 (two) times daily.    [provider]  pantoprazole (PROTONIX) 40 MG tablet Take 40 mg by mouth daily as needed (heartburn). 04/27/21   [provider]   rosuvastatin (CRESTOR) 10 MG tablet TAKE 1 TABLET BY MOUTH EVERY DAY Patient taking differently: Take 10 mg by mouth every evening. 03/20/21   Patwardhan, Reynold Bowen, MD  triamcinolone cream (KENALOG) 0.1 % Apply topically 2 (two) times daily as needed (rash). 06/01/20   [provider]  Vitamin D, Ergocalciferol, 50 MCG (2000 UT) CAPS Take 1 capsule by mouth daily.    [provider]    Allergies    Entresto [sacubitril-valsartan], Lisinopril, and Venlafaxine  Review of Systems   Review of Systems  Constitutional:  Positive for fatigue. Negative for chills, diaphoresis and fever.  HENT:  Negative for congestion.   Respiratory:  Positive for chest tightness and shortness of breath. Negative for cough and wheezing.   Cardiovascular:  Positive for chest pain, palpitations and leg swelling.  Gastrointestinal:  Positive for abdominal distention, abdominal pain and constipation. Negative for anorexia, diarrhea, nausea and vomiting.  Genitourinary:  Positive for frequency. Negative for dysuria and flank pain.  Musculoskeletal:  Negative for back pain, neck pain and neck stiffness.  Skin:  Negative for rash and wound.  Neurological:  Negative for dizziness, light-headedness and headaches.  Psychiatric/Behavioral:  Negative for agitation.   All other systems reviewed and are negative.  Physical Exam Updated Vital Signs BP 100/69 (BP Location: Right Arm)   Pulse 63   Temp 97.9 F (36.6 C) (Oral)   Resp 16   SpO2 99%   Physical Exam Vitals and nursing note reviewed.  Constitutional:      General: She is not in acute distress.    Appearance: She is well-developed. She is not ill-appearing, toxic-appearing or diaphoretic.  HENT:     Head: Normocephalic and atraumatic.     Nose: No congestion.     Mouth/Throat:     Mouth: Mucous membranes are moist.     Pharynx: No oropharyngeal exudate or posterior oropharyngeal erythema.  Eyes:     Extraocular Movements: Extraocular  movements intact.     Conjunctiva/sclera: Conjunctivae normal.     Pupils: Pupils are equal, round, and reactive to light.  Cardiovascular:     Rate and Rhythm: Normal rate and regular rhythm.     Pulses: Normal pulses.     Heart sounds: No murmur heard. Pulmonary:     Effort: Pulmonary effort is normal. No respiratory distress.     Breath sounds: Rales present. No wheezing or rhonchi.  Chest:     Chest wall: No tenderness.  Abdominal:     Palpations: Abdomen is soft.     Tenderness: There is abdominal tenderness. There is no right CVA tenderness, left CVA tenderness, guarding or rebound.  Musculoskeletal:        General: Tenderness present.     Cervical back: Neck supple. No tenderness.     Right lower leg: Edema present.     Left lower leg: Edema present.  Skin:    General: Skin is warm and dry.     Findings: No erythema or rash.  Neurological:     General: No focal deficit present.     Mental Status: She is alert.     Sensory: No sensory deficit.     Motor: No weakness.  Psychiatric:        Mood and Affect: Mood normal.    ED Results / Procedures / Treatments   Labs (all labs ordered are listed, but only abnormal results are displayed) Labs Reviewed  COMPREHENSIVE METABOLIC PANEL - Abnormal; Notable for the following components:      Result Value   CO2 20 (*)    Glucose, Bld 113 (*)    BUN 50 (*)    Creatinine, Ser 2.30 (*)    Albumin 3.3 (*)    Alkaline Phosphatase 37 (*)    Total Bilirubin 1.3 (*)    GFR, Estimated 21 (*)    All other components within normal limits  CBC - Abnormal; Notable for the following components:   Hemoglobin 11.2 (*)    MCH 25.1 (*)    RDW 19.4 (*)    All other components within normal limits  BRAIN NATRIURETIC PEPTIDE - Abnormal; Notable for the following components:   B Natriuretic Peptide >4,500.0 (*)    All other components within normal limits  TROPONIN I (HIGH SENSITIVITY) - Abnormal; Notable for the following components:    Troponin I (High Sensitivity) 37 (*)    All other components within normal limits  TROPONIN I (HIGH SENSITIVITY) - Abnormal; Notable for the following components:  Troponin I (High Sensitivity) 37 (*)    All other components within normal limits  RESP PANEL BY RT-PCR (FLU A&B, COVID) [id number]  URINE CULTURE  LIPASE, BLOOD  URINALYSIS, ROUTINE W REFLEX MICROSCOPIC    EKG EKG Interpretation  Date/Time:  Tuesday July 24 2021 06:49:41 EDT Ventricular Rate:  69 PR Interval:  168 QRS Duration: 166 QT Interval:  500 QTC Calculation: 535 R Axis:   -78 Text Interpretation: AV dual-paced rhythm with frequent Premature ventricular complexes Abnormal ECG When compared to prior, similar paced rhythm with more PVC today. No STEMI Confirmed by Antony Blackbird [phone]) on 07/24/2021 4:59:16 PM  Radiology CT ABDOMEN PELVIS WO CONTRAST  Result Date: 07/24/2021 CLINICAL DATA:  Chest and abdominal pain. EXAM: CT CHEST, ABDOMEN AND PELVIS WITHOUT CONTRAST TECHNIQUE: Multidetector CT imaging of the chest, abdomen and pelvis was performed following the standard protocol without IV contrast. COMPARISON:  CT abdomen 03/26/2021 FINDINGS: CT CHEST FINDINGS Cardiovascular: The heart is enlarged. No pericardial effusion. The pacer wires are stable. Stable tortuosity and calcification of the thoracic aorta. Stable three-vessel coronary artery calcifications. Mediastinum/Nodes: Stable scattered mediastinal and hilar lymph nodes. The esophagus is grossly normal. Lungs/Pleura: Mosaic pattern of ground-glass attenuation in the lungs. This could be due to small airways disease such as respiratory bronchiolitis, reactive airways disease or asthma. Other possibilities would include asymmetric pulmonary edema, cryptogenic organizing pneumonia and hypersensitivity pneumonitis. No focal airspace consolidation to suggest pneumonia. There are bilateral small pleural effusions with overlying atelectasis. Scattered small pulmonary  nodules are noted in both lungs. Some of these could be inflammatory and none measure more than 6 mm. Musculoskeletal: Diffuse body wall edema is noted. No breast masses. 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 Prominent soft tissue noted in the right supraclavicular region. A prior neck CT from 2018 showed this to be tortuous vasculature. No discrete mass. Severe degenerative changes involving the thoracic spine along with a moderate scoliotic curvature. CT ABDOMEN PELVIS FINDINGS Hepatobiliary: No obvious hepatic lesions without contrast. No intrahepatic biliary dilatation. Numerous gallstones are noted in the gallbladder. No common bile duct dilatation. Pancreas: No mass, inflammation or ductal dilatation. Spleen: Normal size.  No focal lesions. Adrenals/Urinary Tract: The adrenal glands are grossly normal in stable. Numerous small simple and hemorrhagic renal cysts. Stable upper pole right renal cysts. The bladder is grossly normal. Stomach/Bowel: The stomach, duodenum, small bowel and colon are grossly normal. No obvious acute inflammatory process, mass lesions or obstructive findings. Stable severe sigmoid colon diverticulosis without definite findings for acute diverticulitis. Vascular/Lymphatic: Stable advanced atherosclerotic calcifications involving the aorta, iliac arteries and branch vessels but no aneurysm. Stable scattered mesenteric and retroperitoneal lymph nodes. Diffuse mesenteric edema. Reproductive: The uterus and ovaries are unremarkable. Other: Diffuse mesenteric edema presacral edema. No significant pelvic fluid collections. No inguinal mass or adenopathy. Musculoskeletal: Severe diffuse body wall edema edema suggesting anasarca. Severe arthropathic changes involving both hips. No acute bony findings. IMPRESSION: 1. Mosaic pattern of ground-glass attenuation in the lungs could be due to small airways disease such as  respiratory bronchiolitis, reactive airways disease or asthma. Other possibilities would include asymmetric pulmonary edema, cryptogenic organizing pneumonia and hypersensitivity pneumonitis. 2. Small bilateral pleural effusions with overlying atelectasis. 3. Stable advanced atherosclerotic calcifications involving the thoracic and abdominal aorta and branch vessels including the coronary arteries. 4. Cholelithiasis. 5. Numerous small simple and hemorrhagic renal cysts. 6. Stable severe sigmoid colon diverticulosis without findings for acute diverticulitis. 7. Severe diffuse body wall  edema, mesenteric edema and presacral edema suggesting anasarca. Aortic Atherosclerosis (ICD10-I70.0). Electronically Signed   By: Marijo Sanes M.D.   On: 07/24/2021 19:42   CT Chest Wo Contrast  Result Date: 07/24/2021 CLINICAL DATA:  Chest and abdominal pain. EXAM: CT CHEST, ABDOMEN AND PELVIS WITHOUT CONTRAST TECHNIQUE: Multidetector CT imaging of the chest, abdomen and pelvis was performed following the standard protocol without IV contrast. COMPARISON:  CT abdomen 03/26/2021 FINDINGS: CT CHEST FINDINGS Cardiovascular: The heart is enlarged. No pericardial effusion. The pacer wires are stable. Stable tortuosity and calcification of the thoracic aorta. Stable three-vessel coronary artery calcifications. Mediastinum/Nodes: Stable scattered mediastinal and hilar lymph nodes. The esophagus is grossly normal. Lungs/Pleura: Mosaic pattern of ground-glass attenuation in the lungs. This could be due to small airways disease such as respiratory bronchiolitis, reactive airways disease or asthma. Other possibilities would include asymmetric pulmonary edema, cryptogenic organizing pneumonia and hypersensitivity pneumonitis. No focal airspace consolidation to suggest pneumonia. There are bilateral small pleural effusions with overlying atelectasis. Scattered small pulmonary nodules are noted in both lungs. Some of these could be  inflammatory and none measure more than 6 mm. Musculoskeletal: Diffuse body wall edema is noted. No breast masses. 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 Prominent soft tissue noted in the right supraclavicular region. A prior neck CT from 2018 showed this to be tortuous vasculature. No discrete mass. Severe degenerative changes involving the thoracic spine along with a moderate scoliotic curvature. CT ABDOMEN PELVIS FINDINGS Hepatobiliary: No obvious hepatic lesions without contrast. No intrahepatic biliary dilatation. Numerous gallstones are noted in the gallbladder. No common bile duct dilatation. Pancreas: No mass, inflammation or ductal dilatation. Spleen: Normal size.  No focal lesions. Adrenals/Urinary Tract: The adrenal glands are grossly normal in stable. Numerous small simple and hemorrhagic renal cysts. Stable upper pole right renal cysts. The bladder is grossly normal. Stomach/Bowel: The stomach, duodenum, small bowel and colon are grossly normal. No obvious acute inflammatory process, mass lesions or obstructive findings. Stable severe sigmoid colon diverticulosis without definite findings for acute diverticulitis. Vascular/Lymphatic: Stable advanced atherosclerotic calcifications involving the aorta, iliac arteries and branch vessels but no aneurysm. Stable scattered mesenteric and retroperitoneal lymph nodes. Diffuse mesenteric edema. Reproductive: The uterus and ovaries are unremarkable. Other: Diffuse mesenteric edema presacral edema. No significant pelvic fluid collections. No inguinal mass or adenopathy. Musculoskeletal: Severe diffuse body wall edema edema suggesting anasarca. Severe arthropathic changes involving both hips. No acute bony findings. IMPRESSION: 1. Mosaic pattern of ground-glass attenuation in the lungs could be due to small airways disease such as respiratory bronchiolitis, reactive airways disease or asthma.  Other possibilities would include asymmetric pulmonary edema, cryptogenic organizing pneumonia and hypersensitivity pneumonitis. 2. Small bilateral pleural effusions with overlying atelectasis. 3. Stable advanced atherosclerotic calcifications involving the thoracic and abdominal aorta and branch vessels including the coronary arteries. 4. Cholelithiasis. 5. Numerous small simple and hemorrhagic renal cysts. 6. Stable severe sigmoid colon diverticulosis without findings for acute diverticulitis. 7. Severe diffuse body wall edema, mesenteric edema and presacral edema suggesting anasarca. Aortic Atherosclerosis (ICD10-I70.0). Electronically Signed   By: Marijo Sanes M.D.   On: 07/24/2021 19:42    Procedures Procedures   Medications Ordered in ED Medications  bumetanide (BUMEX) injection 1 mg (has no administration in time range)  cefTRIAXone (ROCEPHIN) 2 g in sodium chloride 0.9 % 100 mL IVPB (has no administration in time range)  doxycycline (VIBRAMYCIN) 100 mg in sodium chloride 0.9 % 250 mL IVPB (has no administration  in time range)    ED Course  I have reviewed the triage vital signs and the nursing notes.  Pertinent labs & imaging results that were available during my care of the patient were reviewed by me and considered in my medical decision making (see chart for details).    MDM Rules/Calculators/A&P                           Holly [name] is a 80 y.o. female with a past medical history significant for heart failure, third-degree heart block with pacemaker, kidney disease, hypertension, and cholelithiasis who presents with 3 days of worsening abdominal pain, sensation of distention, shortness of breath, lower chest discomfort, urinary frequency, and fatigue.  According to patient, since he had a pacemaker placed 2 months ago she has been having what she felt was abdominal pain and swelling and shortness of breath however she reports that is worsened over the last few days acutely.  She  reports the pain across her abdomen is a 9 out of 10 in severity and does wrap around the side of her abdomen.  She reports she feels her abdomen is distended specifically on the right flank or it seems to go up towards her back.  She feels a bulge on the side but denies any history of known hernia.  She reports her urine has been more frequent and she also reports her legs are more swollen and edematous.  She reports that her doctors have been changing around her diuretics and they do not seem to be working.  She reports she is more short of breath and could not sleep last night due to all of her symptoms including shortness of breath.  She reports no significant fevers, chills, congestion, or cough.  She denies any current nausea or vomiting.  She does report that eating makes her abdominal pain worse and she reports constipation but no diarrhea.  She denies trauma.  Denies any rashes otherwise.  On exam, lungs have rales.  Chest was nontender but abdomen was tender.  Bowel sounds were appreciated and she does have a systolic murmur.  Her abdomen was also tender on the right flank where she does have what appears to be pannus however patient thinks it is a "bulge".  Legs are edematous bilaterally but she has intact strength, sensation, and pulses in extremities.  EKG revealed similar paced rhythm with more PVCs.  No STEMI.  Clinically I am concerned about several etiologies of all of her symptoms.  With her abdominal pain in the upper abdomen, worsened with eating, and known  cholelithiasis, will see what her labs show and get imaging to rule out gallbladder etiology.  With the swelling on her right side that seems to be going to her back, we will get CT imaging of her chest and abdomen to look for a seroma, herniation, or other deep abnormality causing this symptomatology.  We will check her for COVID and flu given the shortness of breath as well as check a BNP.  With her recent diuretic changes patient may  just be having fluid overload causing some of the swelling and discomfort changes.  Anticipate reassessment after work-up to determine disposition.  5:40 PM Pacemaker interrogation which was called in.  The pacemaker team reports that the patient may have had a 45-second episode of VT today but it was not a definitive description of what happened.  They also say that so the indicators  are suggestive of heart failure exacerbation and fluid overload.  Still awaiting rest of work-up.  9:52 PM On reassessment, patient is now reporting some more cough and some chills.  The CT scan returned showing likely edema and consolidation concerning for infection/pneumonia.  Given the symptoms, will treat with antibiotics.  Cardiology requested a dose of 1 mg IV Bumex to start the diuresis despite the worsened kidney function.  Patient does have AKI compared to prior.  Troponin remained stable and not elevating.  BNP greater than 4500.  Other work-up reassuring.  Given the cough and imaging findings, will treat with antibiotics.  Given the arrhythmia today, the AKI and worsening fluid overload, and the likely pneumonia, will call for admission.  Pharmacy helped order the antibiotic so as to not provoke her prolonged QTC problems.  Final Clinical Impression(s) / ED Diagnoses Final diagnoses:  Edema, unspecified type  Anasarca  Shortness of breath  Palpitations  AKI (acute kidney injury) ([place])  Community acquired pneumonia, unspecified laterality      Clinical Impression: 1. Edema, unspecified type   2. Anasarca   3. Shortness of breath   4. Palpitations   5. AKI (acute kidney injury) ([place])   6. Community acquired pneumonia, unspecified laterality     Disposition: Admit  This note was prepared with assistance of Systems analyst. Occasional wrong-word or sound-a-like substitutions may have occurred due to the inherent limitations of voice recognition software.     Darlette Dubow,  Gwenyth Allegra, MD 07/24/21 [id number]

## 2021-07-24 NOTE — ED Triage Notes (Signed)
Pt bib [facility] from home. Pt c/o swelling in abdomen and feeling full x 1 week. Pain in abdomen started this am.  Pt c/o weakness.

## 2021-07-24 NOTE — ED Notes (Addendum)
Pt is not steady enough to ambulate

## 2021-07-24 NOTE — H&P (Signed)
History and Physical    Holly [name] [id number] DOB: January 28, 1941 DOA: 07/24/2021  PCP: Merrilee Seashore, MD  Patient coming from: Home  I have personally briefly reviewed patient's old medical records in [place]  Chief Complaint: Edema  HPI: Holly [name] is a 80 y.o. female with medical history significant of HFrEF, PPM / AICD, HTN, CKD 4, PAF on eliquis.  Pt just admitted for decompensated HF last month, had AKI that admit as well.  Diuresed with IV lasix with improvement in HF symptoms and AKI improved with diuresis as well.  Pt presents to ED today with c/o chest tightness, worsening peripheral edema.  Symptoms worsening over the last few days.  Not helped by home Bumex.  Symptoms constant.  Associated SOB.  No fevers, chills, cough, dysuria.   ED Course: BNP > 4500, no SIRS.  CT shows:Mosaic pattern of ground-glass attenuation in the lungs could be due to small airways disease such as respiratory bronchiolitis, reactive airways disease or asthma. Other possibilities would include asymmetric pulmonary edema, cryptogenic organizing pneumonia and hypersensitivity pneumonitis.  Also shows diffuse body edema and anasarca.  EDP ordered '1mg'$  bumex IV  Also ordered rocephin + doxycycline   Review of Systems: As per HPI, otherwise all review of systems negative.  Past Medical History:  Diagnosis Date   AV block, 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[place]) 07/15/2017   Bronchitis    HFrEF (heart failure with reduced ejection fraction) ([place]) 04/21/2020   Hypertension    ICD: CRTD Medtronic Device Crtp Percepta Quad Mri 04/23/2021 04/23/2021        Past Surgical History:  Procedure Laterality Date   BIV UPGRADE N/A 04/05/2021   Procedure: BIV UPGRADE;  Surgeon: Vickie Epley, MD;  Location: [facility];  Service: Cardiovascular;  Laterality: N/A;   LEFT HEART CATH AND CORONARY ANGIOGRAPHY N/A 11/11/2017   Procedure: LEFT HEART CATH AND CORONARY ANGIOGRAPHY;  Surgeon: Nigel Mormon, MD;  Location: [facility];  Service: Cardiovascular;  Laterality: N/A;   left leg surgery     PACEMAKER IMPLANT N/A 07/15/2017   Procedure: Pacemaker Implant;  Surgeon: Constance Haw, MD;  Location: [facility];  Service: Cardiovascular;  Laterality: N/A;   UPPER EXTREMITY VENOGRAPHY N/A 04/05/2021   Procedure: UPPER EXTREMITY VENOGRAPHY;  Surgeon: Vickie Epley, MD;  Location: [facility];  Service: Cardiovascular;  Laterality: N/A;     reports that she has never smoked. She has never used smokeless tobacco. She reports that she does not drink alcohol and does not use drugs.  Allergies  Allergen Reactions   Entresto [Sacubitril-Valsartan] Itching, Rash and Cough   Lisinopril Cough   Venlafaxine Rash    Family History  Problem Relation Age of Onset   Hypertension Mother    ALS Sister    Heart attack Brother    Diabetes Daughter      Prior to Admission medications   Medication Sig Start Date End Date Taking? Authorizing Provider  ALPRAZolam Duanne Moron) 0.25 MG tablet Take 1 tablet by mouth daily as needed for anxiety.  01/14/19   [provider]  apixaban (ELIQUIS) 2.5 MG TABS tablet Take 1 tablet (2.5 mg total) by mouth 2 (two) times daily. 04/10/21   Charlynne Cousins, MD  Ascorbic Acid (VITAMIN C) 1000 MG tablet Take 1,000 mg by mouth daily.    [provider]  Biotin 5000 MCG CAPS Take 1 capsule by mouth daily.    [provider]  bumetanide (BUMEX) 1 MG tablet TAKE 1 TABLET BY MOUTH TWICE A DAY 07/11/21   Patwardhan, Manish J, MD  clobetasol ointment (TEMOVATE) [id number] % Apply 1 application topically 2 (two) times daily as needed (itching).  08/02/17   [provider]  clotrimazole-betamethasone (LOTRISONE) cream Apply 1 application topically in the morning and at bedtime. 01/16/17   [provider]  EUCRISA 2 % OINT Apply 1 application topically daily as needed (dry skin).  01/14/19   [provider]  fexofenadine (ALLEGRA) 180 MG tablet Take 180 mg by mouth daily.    [provider]  isosorbide-hydrALAZINE (BIDIL) 20-37.5 MG tablet Take 1 tablet by mouth 2 (two) times daily. 07/20/21 08/19/21  Patwardhan, Reynold Bowen, MD  metoprolol succinate (TOPROL-XL) 50 MG 24 hr tablet Take 0.5 tablets (25 mg total) by mouth daily. Take with or immediately following a meal. 04/08/21   Charlynne Cousins, MD  nitroGLYCERIN (NITROSTAT) 0.4 MG SL tablet Place 1 tablet (0.4 mg total) under the tongue every 5 (five) minutes as needed for chest pain. 09/23/19 07/04/21  Patwardhan, Reynold Bowen, MD  OLOPATADINE HCL OP Place 1 drop into both eyes 2 (two) times daily.    [provider]  pantoprazole (PROTONIX) 40 MG tablet Take 40 mg by mouth daily as needed (heartburn). 04/27/21   [provider]  rosuvastatin (CRESTOR) 10 MG tablet TAKE 1 TABLET BY MOUTH EVERY DAY Patient taking differently: Take 10 mg by mouth every evening. 03/20/21   Patwardhan, Reynold Bowen, MD  triamcinolone cream (KENALOG) 0.1 % Apply topically 2 (two) times daily as needed (rash). 06/01/20   [provider]  Vitamin D, Ergocalciferol, 50 MCG (2000 UT) CAPS Take 1 capsule by mouth daily.    [provider]    Physical Exam: Vitals:   07/24/21 2100 07/24/21 2115 07/24/21 2130 07/24/21 2230  BP: 104/70 94/69 110/76 94/61  Pulse: 62 (!) 53 66 62  Resp: '15 12 18 19  '$ Temp:      TempSrc:      SpO2: 99% 98% 100% 99%    Constitutional: NAD, calm, comfortable Eyes: PERRL, lids and conjunctivae normal ENMT: Mucous membranes are moist. Posterior pharynx clear of any exudate or lesions.Normal dentition.  Neck: normal, supple, no masses, no thyromegaly Respiratory: Rhonchi Cardiovascular: 4+ BLE and abd wall pitting edema / anasarca Abdomen: no tenderness, no masses palpated. No hepatosplenomegaly. Bowel sounds positive.  Musculoskeletal: no clubbing / cyanosis. No joint deformity upper and lower  extremities. Good ROM, no contractures. Normal muscle tone.  Skin: no rashes, lesions, ulcers. No induration Neurologic: CN 2-12 grossly intact. Sensation intact, DTR normal. Strength 5/5 in all 4.  Psychiatric: Normal judgment and insight. Alert and oriented x 3. Normal mood.    Labs on Admission: I have personally reviewed following labs and imaging studies  CBC: Recent Labs  Lab 07/24/21 0657  WBC 4.2  HGB 11.2*  HCT 36.5  MCV 81.7  PLT [id number]   Basic Metabolic Panel: Recent Labs  Lab 07/24/21 0657  NA 135  K 4.8  CL 103  CO2 20*  GLUCOSE 113*  BUN 50*  CREATININE 2.30*  CALCIUM 10.2   GFR: Estimated Creatinine Clearance: 16.6 mL/min (A) (by C-G formula based on SCr of 2.3 mg/dL (H)). Liver Function Tests: Recent Labs  Lab 07/24/21 0657  AST 37  ALT 10  ALKPHOS 37*  BILITOT 1.3*  PROT 6.9  ALBUMIN 3.3*   Recent Labs  Lab 07/24/21 [phone]  LIPASE 35   No results for input(s): AMMONIA in the last 168 hours. Coagulation Profile: No results for input(s): INR, PROTIME in the last 168 hours. Cardiac Enzymes: No results for input(s): CKTOTAL, CKMB, CKMBINDEX, TROPONINI in the last 168 hours. BNP (last 3 results) No results for input(s): PROBNP in the last 8760 hours. HbA1C: No results for input(s): HGBA1C in the last 72 hours. CBG: No results for input(s): GLUCAP in the last 168 hours. Lipid Profile: No results for input(s): CHOL, HDL, LDLCALC, TRIG, CHOLHDL, LDLDIRECT in the last 72 hours. Thyroid Function Tests: No results for input(s): TSH, T4TOTAL, FREET4, T3FREE, THYROIDAB in the last 72 hours. Anemia Panel: No results for input(s): VITAMINB12, FOLATE, FERRITIN, TIBC, IRON, RETICCTPCT in the last 72 hours. Urine analysis:    Component Value Date/Time   COLORURINE YELLOW 07/24/2021 0653   APPEARANCEUR CLEAR 07/24/2021 0653   APPEARANCEUR Cloudy (A) 06/15/2021 1205   LABSPEC 1.015 07/24/2021 0653   PHURINE 6.0 07/24/2021 0653   GLUCOSEU NEGATIVE  07/24/2021 0653   HGBUR NEGATIVE 07/24/2021 0653   BILIRUBINUR NEGATIVE 07/24/2021 0653   BILIRUBINUR Negative 06/15/2021 [place] 07/24/2021 0653   PROTEINUR NEGATIVE 07/24/2021 0653   UROBILINOGEN 0.2 07/11/2009 1942   NITRITE NEGATIVE 07/24/2021 0653   LEUKOCYTESUR NEGATIVE 07/24/2021 0653    Radiological Exams on Admission: CT ABDOMEN PELVIS WO CONTRAST  Result Date: 07/24/2021 CLINICAL DATA:  Chest and abdominal pain. EXAM: CT CHEST, ABDOMEN AND PELVIS WITHOUT CONTRAST TECHNIQUE: Multidetector CT imaging of the chest, abdomen and pelvis was performed following the standard protocol without IV contrast. COMPARISON:  CT abdomen 03/26/2021 FINDINGS: CT CHEST FINDINGS Cardiovascular: The heart is enlarged. No pericardial effusion. The pacer wires are stable. Stable tortuosity and calcification of the thoracic aorta. Stable three-vessel coronary artery calcifications. Mediastinum/Nodes: Stable scattered mediastinal and hilar lymph nodes. The esophagus is grossly normal. Lungs/Pleura: Mosaic pattern of ground-glass attenuation in the lungs. This could be due to small airways disease such as respiratory bronchiolitis, reactive airways disease or asthma. Other possibilities would include asymmetric pulmonary edema, cryptogenic organizing pneumonia and hypersensitivity pneumonitis. No focal airspace consolidation to suggest pneumonia. There are bilateral small pleural effusions with overlying atelectasis. Scattered small pulmonary nodules are noted in both lungs. Some of these could be inflammatory and none measure more than 6 mm. Musculoskeletal: Diffuse body wall edema is noted. No breast masses. 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 Prominent soft tissue noted in the right supraclavicular region. A prior neck CT from 2018 showed this to be tortuous vasculature. No discrete mass. Severe degenerative changes involving  the thoracic spine along with a moderate scoliotic curvature. CT ABDOMEN PELVIS FINDINGS Hepatobiliary: No obvious hepatic lesions without contrast. No intrahepatic biliary dilatation. Numerous gallstones are noted in the gallbladder. No common bile duct dilatation. Pancreas: No mass, inflammation or ductal dilatation. Spleen: Normal size.  No focal lesions. Adrenals/Urinary Tract: The adrenal glands are grossly normal in stable. Numerous small simple and hemorrhagic renal cysts. Stable upper pole right renal cysts. The bladder is grossly normal. Stomach/Bowel: The stomach, duodenum, small bowel and colon are grossly normal. No obvious acute inflammatory process, mass lesions or obstructive findings. Stable severe sigmoid colon diverticulosis without definite findings for acute diverticulitis. Vascular/Lymphatic: Stable advanced atherosclerotic calcifications involving the aorta, iliac arteries and branch vessels but no aneurysm. Stable scattered mesenteric and retroperitoneal lymph nodes. Diffuse mesenteric edema. Reproductive: The uterus and ovaries are unremarkable. Other: Diffuse mesenteric edema presacral edema. No  significant pelvic fluid collections. No inguinal mass or adenopathy. Musculoskeletal: Severe diffuse body wall edema edema suggesting anasarca. Severe arthropathic changes involving both hips. No acute bony findings. IMPRESSION: 1. Mosaic pattern of ground-glass attenuation in the lungs could be due to small airways disease such as respiratory bronchiolitis, reactive airways disease or asthma. Other possibilities would include asymmetric pulmonary edema, cryptogenic organizing pneumonia and hypersensitivity pneumonitis. 2. Small bilateral pleural effusions with overlying atelectasis. 3. Stable advanced atherosclerotic calcifications involving the thoracic and abdominal aorta and branch vessels including the coronary arteries. 4. Cholelithiasis. 5. Numerous small simple and hemorrhagic renal cysts.  6. Stable severe sigmoid colon diverticulosis without findings for acute diverticulitis. 7. Severe diffuse body wall edema, mesenteric edema and presacral edema suggesting anasarca. Aortic Atherosclerosis (ICD10-I70.0). Electronically Signed   By: Marijo Sanes M.D.   On: 07/24/2021 19:42   CT Chest Wo Contrast  Result Date: 07/24/2021 CLINICAL DATA:  Chest and abdominal pain. EXAM: CT CHEST, ABDOMEN AND PELVIS WITHOUT CONTRAST TECHNIQUE: Multidetector CT imaging of the chest, abdomen and pelvis was performed following the standard protocol without IV contrast. COMPARISON:  CT abdomen 03/26/2021 FINDINGS: CT CHEST FINDINGS Cardiovascular: The heart is enlarged. No pericardial effusion. The pacer wires are stable. Stable tortuosity and calcification of the thoracic aorta. Stable three-vessel coronary artery calcifications. Mediastinum/Nodes: Stable scattered mediastinal and hilar lymph nodes. The esophagus is grossly normal. Lungs/Pleura: Mosaic pattern of ground-glass attenuation in the lungs. This could be due to small airways disease such as respiratory bronchiolitis, reactive airways disease or asthma. Other possibilities would include asymmetric pulmonary edema, cryptogenic organizing pneumonia and hypersensitivity pneumonitis. No focal airspace consolidation to suggest pneumonia. There are bilateral small pleural effusions with overlying atelectasis. Scattered small pulmonary nodules are noted in both lungs. Some of these could be inflammatory and none measure more than 6 mm. Musculoskeletal: Diffuse body wall edema is noted. No breast masses. 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 Prominent soft tissue noted in the right supraclavicular region. A prior neck CT from 2018 showed this to be tortuous vasculature. No discrete mass. Severe degenerative changes involving the thoracic spine along with a moderate scoliotic curvature. CT ABDOMEN  PELVIS FINDINGS Hepatobiliary: No obvious hepatic lesions without contrast. No intrahepatic biliary dilatation. Numerous gallstones are noted in the gallbladder. No common bile duct dilatation. Pancreas: No mass, inflammation or ductal dilatation. Spleen: Normal size.  No focal lesions. Adrenals/Urinary Tract: The adrenal glands are grossly normal in stable. Numerous small simple and hemorrhagic renal cysts. Stable upper pole right renal cysts. The bladder is grossly normal. Stomach/Bowel: The stomach, duodenum, small bowel and colon are grossly normal. No obvious acute inflammatory process, mass lesions or obstructive findings. Stable severe sigmoid colon diverticulosis without definite findings for acute diverticulitis. Vascular/Lymphatic: Stable advanced atherosclerotic calcifications involving the aorta, iliac arteries and branch vessels but no aneurysm. Stable scattered mesenteric and retroperitoneal lymph nodes. Diffuse mesenteric edema. Reproductive: The uterus and ovaries are unremarkable. Other: Diffuse mesenteric edema presacral edema. No significant pelvic fluid collections. No inguinal mass or adenopathy. Musculoskeletal: Severe diffuse body wall edema edema suggesting anasarca. Severe arthropathic changes involving both hips. No acute bony findings. IMPRESSION: 1. Mosaic pattern of ground-glass attenuation in the lungs could be due to small airways disease such as respiratory bronchiolitis, reactive airways disease or asthma. Other possibilities would include asymmetric pulmonary edema, cryptogenic organizing pneumonia and hypersensitivity pneumonitis. 2. Small bilateral pleural effusions with overlying atelectasis. 3. Stable advanced atherosclerotic calcifications involving the thoracic and  abdominal aorta and branch vessels including the coronary arteries. 4. Cholelithiasis. 5. Numerous small simple and hemorrhagic renal cysts. 6. Stable severe sigmoid colon diverticulosis without findings for acute  diverticulitis. 7. Severe diffuse body wall edema, mesenteric edema and presacral edema suggesting anasarca. Aortic Atherosclerosis (ICD10-I70.0). Electronically Signed   By: Marijo Sanes M.D.   On: 07/24/2021 19:42    EKG: Independently reviewed.  Assessment/Plan Principal Problem:   Acute on chronic HFrEF (heart failure with reduced ejection fraction) (HCC) Active Problems:   Pacemaker: CRT-P Medtronic Device Crtp Percepta Quad Mri 04/23/2021   Essential hypertension   Paroxysmal A-fib (HCC)   AKI (acute kidney injury) ([place])   CKD (chronic kidney disease) stage 4, GFR 15-29 ml/min (HCC)    Acute on chronic HFrEF - Patient has acute decompensated CHF: Patient presents with: dyspnea on exertion / increased shortness of breath Exam findings include: PULMONARY CRACKLES and bilateral leg edema Work up findings include: PULMONARY EDEMA ON CXR CHF pathway Treating with IV bumex in ED Will put on lasix '40mg'$  IV BID here since that worked last month ? Of PNA - Seems less likely, no SIRS today, CT findings probably more asymmetric edema Getting rocephin + azithro in ED Getting procalcitonin If procalcitonin neg and no WBC elevation in AM, or SIRS overnight, would consider DCing ABx tomorrow.  Asymmetric edema seems more likely what we are seeing on CT today. 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  DVT prophylaxis: Eliquis Code Status: Full Family Communication: Daughter at bedside Disposition Plan: Home after diuresis Consults called: None Admission status: Place in obs    Amanda Pote, [facility] Hospital[facility]  How to contact the [facility] Attending or Consulting  provider [place] or covering provider during after hours [place], for this patient?  Check the care team in [facility] and look for a) attending/consulting [facility] provider listed and b) the [facility] team listed Log into [email or web address]  Amion Physician Scheduling and messaging for groups and whole hospitals  On call and physician scheduling software for group practices, residents, hospitalists and other medical providers for call, clinic, rotation and shift schedules. OnCall [employer] is a hospital-wide system for scheduling doctors and paging doctors on call. EasyPlot is for scientific plotting and data analysis.  [email or web address]  and use [place]'s universal password to access. If you do not have the password, please contact the hospital operator.  Locate the [facility] provider you are looking for under [facility] and page to a number that you can be directly reached. If you still have difficulty reaching the provider, please page the [facility] (Director on Call) for the Hospitalists listed on amion for assistance.  07/24/2021, 10:55 PM

## 2021-07-24 NOTE — Telephone Encounter (Signed)
I will follow the chart. ER doctors will contact me if any acute cardiac issue is suspected.  Thanks MJP

## 2021-07-25 ENCOUNTER — Other Ambulatory Visit: Payer: Self-pay | Admitting: Cardiology

## 2021-07-25 DIAGNOSIS — N179 Acute kidney failure, unspecified: Secondary | ICD-10-CM | POA: Diagnosis not present

## 2021-07-25 DIAGNOSIS — Z79899 Other long term (current) drug therapy: Secondary | ICD-10-CM | POA: Diagnosis not present

## 2021-07-25 DIAGNOSIS — Z45018 Encounter for adjustment and management of other part of cardiac pacemaker: Secondary | ICD-10-CM | POA: Diagnosis not present

## 2021-07-25 DIAGNOSIS — Z23 Encounter for immunization: Secondary | ICD-10-CM | POA: Diagnosis not present

## 2021-07-25 DIAGNOSIS — Z515 Encounter for palliative care: Secondary | ICD-10-CM | POA: Diagnosis not present

## 2021-07-25 DIAGNOSIS — I5023 Acute on chronic systolic (congestive) heart failure: Secondary | ICD-10-CM

## 2021-07-25 DIAGNOSIS — Z8249 Family history of ischemic heart disease and other diseases of the circulatory system: Secondary | ICD-10-CM | POA: Diagnosis not present

## 2021-07-25 DIAGNOSIS — I5022 Chronic systolic (congestive) heart failure: Secondary | ICD-10-CM | POA: Diagnosis not present

## 2021-07-25 DIAGNOSIS — Z95810 Presence of automatic (implantable) cardiac defibrillator: Secondary | ICD-10-CM | POA: Diagnosis not present

## 2021-07-25 DIAGNOSIS — I442 Atrioventricular block, complete: Secondary | ICD-10-CM | POA: Diagnosis not present

## 2021-07-25 DIAGNOSIS — I130 Hypertensive heart and chronic kidney disease with heart failure and stage 1 through stage 4 chronic kidney disease, or unspecified chronic kidney disease: Secondary | ICD-10-CM | POA: Diagnosis present

## 2021-07-25 DIAGNOSIS — Z20822 Contact with and (suspected) exposure to covid-19: Secondary | ICD-10-CM | POA: Diagnosis present

## 2021-07-25 DIAGNOSIS — G609 Hereditary and idiopathic neuropathy, unspecified: Secondary | ICD-10-CM | POA: Diagnosis present

## 2021-07-25 DIAGNOSIS — Z7189 Other specified counseling: Secondary | ICD-10-CM | POA: Diagnosis not present

## 2021-07-25 DIAGNOSIS — Z7901 Long term (current) use of anticoagulants: Secondary | ICD-10-CM | POA: Diagnosis not present

## 2021-07-25 DIAGNOSIS — N184 Chronic kidney disease, stage 4 (severe): Secondary | ICD-10-CM | POA: Diagnosis present

## 2021-07-25 DIAGNOSIS — Z888 Allergy status to other drugs, medicaments and biological substances status: Secondary | ICD-10-CM | POA: Diagnosis not present

## 2021-07-25 DIAGNOSIS — I472 Ventricular tachycardia: Secondary | ICD-10-CM | POA: Diagnosis not present

## 2021-07-25 DIAGNOSIS — I480 Paroxysmal atrial fibrillation: Secondary | ICD-10-CM | POA: Diagnosis present

## 2021-07-25 LAB — CBC
HCT: 38.4 % (ref 36.0–46.0)
Hemoglobin: 12 g/dL (ref 12.0–15.0)
MCH: 24.9 pg — ABNORMAL LOW (ref 26.0–34.0)
MCHC: 31.3 g/dL (ref 30.0–36.0)
MCV: 79.8 fL — ABNORMAL LOW (ref 80.0–100.0)
Platelets: 151 10*3/uL (ref 150–400)
RBC: 4.81 MIL/uL (ref 3.87–5.11)
RDW: 19.4 % — ABNORMAL HIGH (ref 11.5–15.5)
WBC: 4.6 10*3/uL (ref 4.0–10.5)
nRBC: 0 % (ref 0.0–0.2)

## 2021-07-25 LAB — BASIC METABOLIC PANEL
Anion gap: 11 (ref 5–15)
BUN: 49 mg/dL — ABNORMAL HIGH (ref 8–23)
CO2: 20 mmol/L — ABNORMAL LOW (ref 22–32)
Calcium: 9.9 mg/dL (ref 8.9–10.3)
Chloride: 105 mmol/L (ref 98–111)
Creatinine, Ser: 2.15 mg/dL — ABNORMAL HIGH (ref 0.44–1.00)
GFR, Estimated: 23 mL/min — ABNORMAL LOW (ref >60)
Glucose, Bld: 124 mg/dL — ABNORMAL HIGH (ref 70–99)
Potassium: 4.2 mmol/L (ref 3.5–5.1)
Sodium: 136 mmol/L (ref 135–145)

## 2021-07-25 LAB — MRSA NEXT GEN BY PCR, NASAL: MRSA by PCR Next Gen: NOT DETECTED

## 2021-07-25 LAB — PROCALCITONIN: Procalcitonin: <0.1 ng/mL

## 2021-07-25 MED ORDER — INFLUENZA VAC A&B SA ADJ QUAD 0.5 ML IM PRSY
0.5000 mL | PREFILLED_SYRINGE | INTRAMUSCULAR | Status: DC
  Filled 2021-07-25: qty 0.5

## 2021-07-25 MED ORDER — PNEUMOCOCCAL VAC POLYVALENT 25 MCG/0.5ML IJ INJ
0.5000 mL | INJECTION | INTRAMUSCULAR | Status: DC
  Filled 2021-07-25: qty 0.5

## 2021-07-25 MED ORDER — PANTOPRAZOLE SODIUM 40 MG PO TBEC: 40.0000 mg | DELAYED_RELEASE_TABLET | Freq: Every day | ORAL | Status: DC | PRN

## 2021-07-25 MED ORDER — FUROSEMIDE 10 MG/ML IJ SOLN
80.0000 mg | Freq: Two times a day (BID) | INTRAMUSCULAR | Status: DC
  Administered 2021-07-25 – 2021-07-27 (×4): 80 mg via INTRAVENOUS
  Filled 2021-07-25 (×4): qty 8

## 2021-07-25 MED ORDER — POLYETHYLENE GLYCOL 3350 17 G PO PACK
17.0000 g | PACK | Freq: Every day | ORAL | Status: DC
  Administered 2021-07-25 – 2021-07-27 (×3): 17 g via ORAL
  Filled 2021-07-25 (×4): qty 1

## 2021-07-25 MED ORDER — DOCUSATE SODIUM 100 MG PO CAPS
100.0000 mg | ORAL_CAPSULE | Freq: Two times a day (BID) | ORAL | Status: DC
  Administered 2021-07-25 – 2021-07-28 (×4): 100 mg via ORAL
  Filled 2021-07-25 (×5): qty 1

## 2021-07-25 NOTE — Progress Notes (Signed)
   07/25/21 0945  Clinical Encounter Type  Visited With Patient  Visit Type Initial  Referral From Nurse  Consult/Referral To Chaplain   Chaplain responded to consult request for Advance Directive. Patient stated she has an AD on file and no changes necessary. 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 Chaplain provided spiritual care in prayer and giving her a Bible. This note was prepared by Jeanine Luz, M.Div..  For questions please contact by phone [phone].

## 2021-07-25 NOTE — Progress Notes (Addendum)
Heart Failure Navigator Progress Note  Assessed for Heart & Vascular TOC clinic readiness.  Patient does not meet criteria due to consult to palliative care in outpt setting 9/2, per chart review in-person meeting scheduled for 10/30. Pt had HV TOC appt rescheduled from 8/24 to 8/30 per pt request, which was then cancelled 1 hour prior to appointment time by [facility] Cardiology office.   Pricilla Holm, MSN, RN Heart Failure Nurse Navigator [phone]

## 2021-07-25 NOTE — Progress Notes (Signed)
PROGRESS NOTE    Holly [name]  [id number] DOB: 1941-01-23 DOA: 07/24/2021 PCP: Merrilee Seashore, MD   Brief Narrative: Taken from H&P.  Holly [name] is a 80 y.o. female with medical history significant of HFrEF, PPM / AICD, HTN, CKD 4, PAF on eliquis was admitted from home with complaint of worsening shortness of breath and lower extremity edema.  Patient has a recent admission for decompensated HFrEF last month.  She was discharged on Bumex.  Per patient she was taking her meds.  ROS was positive for orthopnea and exertional dyspnea.  Apparently patient was recently referred for home hospice by PCP, has not established with them yet but daughter would like to start that service once home.  She wants to diuresed her well before discharged.  Subjective: Patient continues to feel little short of breath.  Unable to lay flat.  Remained on room air.  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  Some improvement with IV diuresis.  Chest was clear today.  Continues to have significant lower extremity edema.  Per patient she is not having enough urinary output with current dose of Lasix.  Recorded urine output of 325 mL only.  BNP elevated above 4500 -Increase the dose of Lasix to 80 mg twice daily. -Can increase the dose of Bumex on discharge. -Strict intake and output -Daily weight and BNP  Concern of pneumonia on chest x-ray.  Bilateral infiltrates most likely secondary to pulmonary edema.  Procalcitonin negative.  She was started on ceftriaxone and Zithromax.  Remained afebrile and no leukocytosis.  Pneumonia ruled out. -Stop antibiotics -Continue  to monitor  AKI with CKD stage IV.  Some improvement in creatinine with IV diuresis. -Monitor renal function. -Avoid nephrotoxins.  Hypertension.  Blood pressure within goal. -Keep holding home Imdur-we will restart once blood pressure started trending up.  Paroxysmal atrial fibrillation. -Continue metoprolol and Eliquis.  Objective: Vitals:   07/25/21 0133 07/25/21 0300 07/25/21 0740 07/25/21 1050  BP:  101/70 121/80 128/90  Pulse:  62  63  Resp:  16 10   Temp:  97.8 F (36.6 C) 97.6 F (36.4 C)   TempSrc:  Oral Oral   SpO2:  98% 98% 90%  Weight: 65.5 kg     Height:        Intake/Output Summary (Last 24 hours) at 07/25/2021 1322 Last data filed at 07/25/2021 1000 Gross per 24 hour  Intake 687.82 ml  Output 325 ml  Net 362.82 ml   Filed Weights   07/25/21 0055 07/25/21 0133  Weight: 65.5 kg 65.5 kg    Examination:  General exam: Appears calm and comfortable  Respiratory system: Clear to auscultation. Respiratory effort normal. Cardiovascular system: S1 & S2 heard, RRR.  Positive murmur Gastrointestinal system: Soft, nontender, nondistended, bowel sounds positive. Central nervous system: Alert and oriented. No focal neurological deficits. Extremities: 2+ LE edema, no cyanosis, pulses intact and symmetrical. Psychiatry: Judgement and insight appear normal.    DVT prophylaxis: Eliquis Code Status: Full Family Communication: Talked with daughter on phone Disposition Plan:  Status is: Observation  The patient will require care spanning > 2 midnights and should be moved  to inpatient because: Inpatient level of care appropriate due to severity of illness  Dispo: The patient is from: Home              Anticipated d/c is to: Home              Patient currently is not medically stable to d/c.   Difficult to place patient No              Level of care: Telemetry Cardiac  All the records are reviewed and case discussed with Care Management/Social Worker. Management  plans discussed with the patient, nursing and they are in agreement.  Consultants:    Procedures:  Antimicrobials:   Data Reviewed: I have personally reviewed following labs and imaging studies  CBC: Recent Labs  Lab 07/24/21 0657 07/25/21 0108  WBC 4.2 4.6  HGB 11.2* 12.0  HCT 36.5 38.4  MCV 81.7 79.8*  PLT 161 [id number]   Basic Metabolic Panel: Recent Labs  Lab 07/24/21 0657 07/25/21 0108  NA 135 136  K 4.8 4.2  CL 103 105  CO2 20* 20*  GLUCOSE 113* 124*  BUN 50* 49*  CREATININE 2.30* 2.15*  CALCIUM 10.2 9.9   GFR: Estimated Creatinine Clearance: 17.7 mL/min (A) (by C-G formula based on SCr of 2.15 mg/dL (H)). Liver Function Tests: Recent Labs  Lab 07/24/21 0657  AST 37  ALT 10  ALKPHOS 37*  BILITOT 1.3*  PROT 6.9  ALBUMIN 3.3*   Recent Labs  Lab 07/24/21 0657  LIPASE 35   No results for input(s): AMMONIA in the last 168 hours. Coagulation Profile: No results for input(s): INR, PROTIME in the last 168 hours. Cardiac Enzymes: No results for input(s): CKTOTAL, CKMB, CKMBINDEX, TROPONINI in the last 168 hours. BNP (last 3 results) No results for input(s): PROBNP in the last 8760 hours. HbA1C: No results for input(s): HGBA1C in the last 72 hours. CBG: No results for input(s): GLUCAP in the last 168 hours. Lipid Profile: No results for input(s): CHOL, HDL, LDLCALC, TRIG, CHOLHDL, LDLDIRECT in the last 72 hours. Thyroid Function Tests: No results for input(s): TSH, T4TOTAL, FREET4, T3FREE, THYROIDAB in the last 72 hours. Anemia Panel: No results for input(s): VITAMINB12, FOLATE, FERRITIN, TIBC, IRON, RETICCTPCT in the last 72 hours. Sepsis Labs: Recent Labs  Lab 07/25/21 0108  PROCALCITON <0.10    Recent Results (from the past 240 hour(s))  Resp Panel by RT-PCR (Flu A&B, Covid) Nasopharyngeal Swab     Status: None   Collection Time: 07/24/21  5:18 PM   Specimen: Nasopharyngeal Swab; Nasopharyngeal(NP) swabs in vial transport medium  Result Value  Ref Range Status   SARS Coronavirus 2 by RT PCR NEGATIVE NEGATIVE Final    Comment: (NOTE) SARS-CoV-2 target nucleic acids are NOT DETECTED.  The SARS-CoV-2 RNA is generally detectable in upper respiratory specimens during the acute phase of infection. The lowest concentration of SARS-CoV-2 viral copies this assay can detect is 138 copies/mL. A negative result does not preclude SARS-Cov-2 infection and should not be used as the sole basis for treatment or other patient management decisions. A negative result may occur with  improper specimen collection/handling, submission of specimen other than nasopharyngeal swab, presence of viral mutation(s) within the areas targeted by this assay, and inadequate number of viral copies(<138 copies/mL). A negative result must be combined with clinical observations, patient history, and epidemiological information. The expected result is Negative.  Fact Sheet for Patients:  EntrepreneurPulse.com.au  Fact Sheet for Healthcare Providers:  IncredibleEmployment.be  This test is no t yet approved or cleared by the [place] and  has been authorized for detection and/or diagnosis of SARS-CoV-2 by FDA under an Emergency Use Authorization (EUA). This EUA will remain  in effect (meaning this test can be used) for the duration of the COVID-19 declaration under Section 564(b)(1) of the Act, 21 U.S.C.section 360bbb-3(b)(1), unless the authorization is terminated  or revoked sooner.       Influenza A by PCR NEGATIVE NEGATIVE Final   Influenza B by PCR NEGATIVE NEGATIVE Final    Comment: (NOTE) The Xpert Xpress SARS-CoV-2/FLU/RSV plus assay is intended as an aid in the diagnosis of influenza from Nasopharyngeal swab specimens and should not be used as a sole basis for treatment. Nasal washings and aspirates are unacceptable for Xpert Xpress SARS-CoV-2/FLU/RSV testing.  Fact Sheet for  Patients: EntrepreneurPulse.com.au  Fact Sheet for Healthcare Providers: IncredibleEmployment.be  This test is not yet approved or cleared by the [place] FDA and has been authorized for detection and/or diagnosis of SARS-CoV-2 by FDA under an Emergency Use Authorization (EUA). This EUA will remain in effect (meaning this test can be used) for the duration of the COVID-19 declaration under Section 564(b)(1) of the Act, 21 U.S.C. section 360bbb-3(b)(1), unless the authorization is terminated or revoked.  Performed at [facility], [place] [address], [place], [place] [address]   MRSA Next Gen by PCR, Nasal     Status: None   Collection Time: 07/25/21  1:05 AM   Specimen: Nasal Mucosa; Nasal Swab  Result Value Ref Range Status   MRSA by PCR Next Gen NOT DETECTED NOT DETECTED Final    Comment: (NOTE) The GeneXpert MRSA Assay (FDA approved for NASAL specimens only), is one component of a comprehensive MRSA colonization surveillance program. It is not intended to diagnose MRSA infection nor to guide or monitor treatment for MRSA infections. Test performance is not FDA approved in patients less than 12 years old. Performed at [facility], [place] [address] [place], [place] [address]      Radiology Studies: CT ABDOMEN PELVIS WO CONTRAST  Result Date: 07/24/2021 CLINICAL DATA:  Chest and abdominal pain. EXAM: CT CHEST, ABDOMEN AND PELVIS WITHOUT CONTRAST TECHNIQUE: Multidetector CT imaging of the chest, abdomen and pelvis was performed following the standard protocol without IV contrast. COMPARISON:  CT abdomen 03/26/2021 FINDINGS: CT CHEST FINDINGS Cardiovascular: The heart is enlarged. No pericardial effusion. The pacer wires are stable. Stable tortuosity and calcification of the thoracic aorta. Stable three-vessel coronary artery calcifications. Mediastinum/Nodes: Stable scattered mediastinal and hilar lymph nodes. The esophagus is grossly  normal. Lungs/Pleura: Mosaic pattern of ground-glass attenuation in the lungs. This could be due to small airways disease such as respiratory bronchiolitis, reactive airways disease or asthma. Other possibilities would include asymmetric pulmonary edema, cryptogenic organizing pneumonia and hypersensitivity pneumonitis. No focal airspace consolidation to suggest pneumonia. There are bilateral small pleural effusions with overlying atelectasis. Scattered small pulmonary nodules are noted in both lungs. Some of these could be inflammatory and none measure more than 6 mm. Musculoskeletal: Diffuse body wall edema is noted. No breast masses. 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 Prominent soft tissue noted in the right supraclavicular region. A prior neck CT from 2018 showed this to be tortuous vasculature. No discrete mass. Severe degenerative changes involving the thoracic spine along with a moderate scoliotic curvature. CT ABDOMEN PELVIS FINDINGS Hepatobiliary: No obvious hepatic lesions without contrast. No intrahepatic  biliary dilatation. Numerous gallstones are noted in the gallbladder. No common bile duct dilatation. Pancreas: No mass, inflammation or ductal dilatation. Spleen: Normal size.  No focal lesions. Adrenals/Urinary Tract: The adrenal glands are grossly normal in stable. Numerous small simple and hemorrhagic renal cysts. Stable upper pole right renal cysts. The bladder is grossly normal. Stomach/Bowel: The stomach, duodenum, small bowel and colon are grossly normal. No obvious acute inflammatory process, mass lesions or obstructive findings. Stable severe sigmoid colon diverticulosis without definite findings for acute diverticulitis. Vascular/Lymphatic: Stable advanced atherosclerotic calcifications involving the aorta, iliac arteries and branch vessels but no aneurysm. Stable scattered mesenteric and retroperitoneal lymph nodes.  Diffuse mesenteric edema. Reproductive: The uterus and ovaries are unremarkable. Other: Diffuse mesenteric edema presacral edema. No significant pelvic fluid collections. No inguinal mass or adenopathy. Musculoskeletal: Severe diffuse body wall edema edema suggesting anasarca. Severe arthropathic changes involving both hips. No acute bony findings. IMPRESSION: 1. Mosaic pattern of ground-glass attenuation in the lungs could be due to small airways disease such as respiratory bronchiolitis, reactive airways disease or asthma. Other possibilities would include asymmetric pulmonary edema, cryptogenic organizing pneumonia and hypersensitivity pneumonitis. 2. Small bilateral pleural effusions with overlying atelectasis. 3. Stable advanced atherosclerotic calcifications involving the thoracic and abdominal aorta and branch vessels including the coronary arteries. 4. Cholelithiasis. 5. Numerous small simple and hemorrhagic renal cysts. 6. Stable severe sigmoid colon diverticulosis without findings for acute diverticulitis. 7. Severe diffuse body wall edema, mesenteric edema and presacral edema suggesting anasarca. Aortic Atherosclerosis (ICD10-I70.0). Electronically Signed   By: Marijo Sanes M.D.   On: 07/24/2021 19:42   CT Chest Wo Contrast  Result Date: 07/24/2021 CLINICAL DATA:  Chest and abdominal pain. EXAM: CT CHEST, ABDOMEN AND PELVIS WITHOUT CONTRAST TECHNIQUE: Multidetector CT imaging of the chest, abdomen and pelvis was performed following the standard protocol without IV contrast. COMPARISON:  CT abdomen 03/26/2021 FINDINGS: CT CHEST FINDINGS Cardiovascular: The heart is enlarged. No pericardial effusion. The pacer wires are stable. Stable tortuosity and calcification of the thoracic aorta. Stable three-vessel coronary artery calcifications. Mediastinum/Nodes: Stable scattered mediastinal and hilar lymph nodes. The esophagus is grossly normal. Lungs/Pleura: Mosaic pattern of ground-glass attenuation in the  lungs. This could be due to small airways disease such as respiratory bronchiolitis, reactive airways disease or asthma. Other possibilities would include asymmetric pulmonary edema, cryptogenic organizing pneumonia and hypersensitivity pneumonitis. No focal airspace consolidation to suggest pneumonia. There are bilateral small pleural effusions with overlying atelectasis. Scattered small pulmonary nodules are noted in both lungs. Some of these could be inflammatory and none measure more than 6 mm. Musculoskeletal: Diffuse body wall edema is noted. No breast masses. 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 Prominent soft tissue noted in the right supraclavicular region. A prior neck CT from 2018 showed this to be tortuous vasculature. No discrete mass. Severe degenerative changes involving the thoracic spine along with a moderate scoliotic curvature. CT ABDOMEN PELVIS FINDINGS Hepatobiliary: No obvious hepatic lesions without contrast. No intrahepatic biliary dilatation. Numerous gallstones are noted in the gallbladder. No common bile duct dilatation. Pancreas: No mass, inflammation or ductal dilatation. Spleen: Normal size.  No focal lesions. Adrenals/Urinary Tract: The adrenal glands are grossly normal in stable. Numerous small simple and hemorrhagic renal cysts. Stable upper pole right renal cysts. The bladder is grossly normal. Stomach/Bowel: The stomach, duodenum, small bowel and colon are grossly normal. No obvious acute inflammatory process, mass lesions or obstructive findings. Stable severe sigmoid colon diverticulosis without definite  findings for acute diverticulitis. Vascular/Lymphatic: Stable advanced atherosclerotic calcifications involving the aorta, iliac arteries and branch vessels but no aneurysm. Stable scattered mesenteric and retroperitoneal lymph nodes. Diffuse mesenteric edema. Reproductive: The uterus and ovaries are  unremarkable. Other: Diffuse mesenteric edema presacral edema. No significant pelvic fluid collections. No inguinal mass or adenopathy. Musculoskeletal: Severe diffuse body wall edema edema suggesting anasarca. Severe arthropathic changes involving both hips. No acute bony findings. IMPRESSION: 1. Mosaic pattern of ground-glass attenuation in the lungs could be due to small airways disease such as respiratory bronchiolitis, reactive airways disease or asthma. Other possibilities would include asymmetric pulmonary edema, cryptogenic organizing pneumonia and hypersensitivity pneumonitis. 2. Small bilateral pleural effusions with overlying atelectasis. 3. Stable advanced atherosclerotic calcifications involving the thoracic and abdominal aorta and branch vessels including the coronary arteries. 4. Cholelithiasis. 5. Numerous small simple and hemorrhagic renal cysts. 6. Stable severe sigmoid colon diverticulosis without findings for acute diverticulitis. 7. Severe diffuse body wall edema, mesenteric edema and presacral edema suggesting anasarca. Aortic Atherosclerosis (ICD10-I70.0). Electronically Signed   By: Marijo Sanes M.D.   On: 07/24/2021 19:42    Scheduled Meds:  apixaban  2.5 mg Oral BID   furosemide  80 mg Intravenous BID   [START ON 07/26/2021] influenza vaccine adjuvanted  0.5 mL Intramuscular Tomorrow-1000   metoprolol succinate  25 mg Oral Daily   [START ON 07/26/2021] pneumococcal 23 valent vaccine  0.5 mL Intramuscular Tomorrow-1000   polyethylene glycol  17 g Oral Daily   rosuvastatin  10 mg Oral QPM   Continuous Infusions:   LOS: 0 days   Time spent: 40 minutes. More than 50% of the time was spent in counseling/coordination of care  Lorella Nimrod, MD [facility]  If 7PM-7AM, please contact night-coverage [email or web address]  07/25/2021, 1:22 PM   This record has been created using Systems analyst. Errors have been sought and corrected,but may not always be  located. Such creation errors do not reflect on the standard of care.

## 2021-07-26 ENCOUNTER — Telehealth: Payer: Self-pay | Admitting: Cardiology

## 2021-07-26 LAB — BASIC METABOLIC PANEL
Anion gap: 10 (ref 5–15)
BUN: 47 mg/dL — ABNORMAL HIGH (ref 8–23)
CO2: 20 mmol/L — ABNORMAL LOW (ref 22–32)
Calcium: 9.7 mg/dL (ref 8.9–10.3)
Chloride: 104 mmol/L (ref 98–111)
Creatinine, Ser: 1.9 mg/dL — ABNORMAL HIGH (ref 0.44–1.00)
GFR, Estimated: 26 mL/min — ABNORMAL LOW (ref >60)
Glucose, Bld: 95 mg/dL (ref 70–99)
Potassium: 3.7 mmol/L (ref 3.5–5.1)
Sodium: 134 mmol/L — ABNORMAL LOW (ref 135–145)

## 2021-07-26 LAB — MAGNESIUM: Magnesium: 1.9 mg/dL (ref 1.7–2.4)

## 2021-07-26 MED ORDER — SODIUM BICARBONATE 650 MG PO TABS
650.0000 mg | ORAL_TABLET | Freq: Two times a day (BID) | ORAL | Status: DC
  Administered 2021-07-26 – 2021-07-28 (×5): 650 mg via ORAL
  Filled 2021-07-26 (×5): qty 1

## 2021-07-26 NOTE — Progress Notes (Signed)
Remote pacemaker check  Triggered event 07/24/2021: AP 77%, CRT 93%, lead impedance and thresholds normal. Ventricular monitoring episode = PVC.  Brief NSVT for 1 second.  No atrial fibrillation. Thoracic impedance is below baseline suggestive of fluid overload state.   Adrian Prows, MD, [facility] 07/26/2021, 1:08 PM Office: [phone] Fax: [phone] Pager: [phone]

## 2021-07-26 NOTE — Telephone Encounter (Signed)
Spoke with the daughter. Patient is appropriately being diuresed under hospitalist care.  Thanks MJP

## 2021-07-26 NOTE — Telephone Encounter (Signed)
Pt's daughter Mateo Flow called; wants to speak with you. Phone # [phone].

## 2021-07-26 NOTE — Progress Notes (Signed)
PROGRESS NOTE    Holly [name]  [id number] DOB: [birth year] DOA: 07/24/2021 PCP: Merrilee Seashore, MD   Brief Narrative: Taken from H&P.  Holly [name] is a 80 y.o. female with medical history significant of HFrEF, PPM / AICD, HTN, CKD 4, PAF on eliquis was admitted from home with complaint of worsening shortness of breath and lower extremity edema.  Patient has a recent admission for decompensated HFrEF last month.  She was discharged on Bumex.  Per patient she was taking her meds.  ROS was positive for orthopnea and exertional dyspnea.  Apparently patient was recently referred for home hospice by PCP, has not established with them yet but daughter would like to start that service once home.  She wants to diuresed her well before discharged.  Daughter is concerned that she is having difficulty breathing while sleeping and asking for home oxygen.  Patient was saturating in high 90s on room air.  Discussed with nursing staff and they were not aware of any nocturnal hypoxia either-we will check continuous pulse ox overnight.  Subjective: Patient was sitting comfortably on the side of bed when seen today.  She wants to stay another day to get more diuresis.  Daughter at bedside.  Per daughter patient was having difficulty breathing while sleeping and asking for home oxygen. No documented hypoxia-talked with nursing staff and they are not aware of any nocturnal hypoxia either.  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  Some improvement with IV diuresis.  Chest was clear today.  Continues to have significant lower extremity edema.  Per patient she is not  having enough urinary output with current dose of Lasix.  Recorded urine output of 325 mL only.  BNP elevated above 4500. Some improvement in output with increased dose of Lasix, 2 pound weight gain, edema seems improving. -Continue Lasix at 80 mg twice daily. -Can increase the dose of Bumex on discharge. -Strict intake and output -Daily weight and BNP  Concern of difficulty breathing while sleeping.  Daughter was concerned that she was having some difficulty breathing while sleeping.  No documented hypoxia.  Discussed with nursing staff and they are not aware of any complaint or nocturnal hypoxia.  Patient was saturating in high 90s on room air which will not make her qualified for home oxygen. -We will check continuous pulse ox overnight. -Might need outpatient sleep study.  Concern of pneumonia on chest x-ray.  Bilateral infiltrates most likely secondary to pulmonary edema.  Procalcitonin negative.  She was started on ceftriaxone and Zithromax.  Remained afebrile and no leukocytosis.  Pneumonia ruled out, and antibiotics were discontinued. -Continue to monitor  AKI with CKD stage IV.  Some improvement in creatinine with IV diuresis.  Appears at baseline now. -Monitor renal function. -Avoid nephrotoxins.  Hypertension.  Blood pressure within goal. -Keep holding home Imdur-we will restart once blood pressure started trending up.  Paroxysmal atrial fibrillation. -Continue metoprolol and Eliquis.  Objective: Vitals:   07/26/21 0443 07/26/21 0730 07/26/21 1225 07/26/21 1537  BP:  118/70 99/68 102/62  Pulse:  66 62 63  Resp:   16 16  Temp:  (!) 97.4 F (36.3  C) 97.8 F (36.6 C) 97.7 F (36.5 C)  TempSrc:  Oral Oral Oral  SpO2:  95% 98%   Weight: 66.3 kg     Height:        Intake/Output Summary (Last 24 hours) at 07/26/2021 1553 Last data filed at 07/26/2021 0452 Gross per 24 hour  Intake --  Output 725 ml  Net -725 ml    Filed Weights   07/25/21 0055 07/25/21 0133 07/26/21  0443  Weight: 65.5 kg 65.5 kg 66.3 kg    Examination:  General.  Frail elderly lady, in no acute distress. Pulmonary.  Lungs clear bilaterally, normal respiratory effort. CV.  Regular rate and rhythm, no JVD, rub or murmur. Abdomen.  Soft, nontender, nondistended, BS positive. CNS.  Alert and oriented.  No focal neurologic deficit. Extremities.  1+ LE edema, no cyanosis, pulses intact and symmetrical. Psychiatry.  Judgment and insight appears normal.   DVT prophylaxis: Eliquis Code Status: Full Family Communication: Discussed with daughter at bedside, other daughter was on phone during discussion. Disposition Plan:  Status is: Inpatient  The patient will require care spanning > 2 midnights and should be moved to inpatient because: Inpatient level of care appropriate due to severity of illness  Dispo: The patient is from: Home              Anticipated d/c is to: Home              Patient currently is not medically stable to d/c.   Difficult to place patient No              Level of care: Progressive  All the records are reviewed and case discussed with Care Management/Social Worker. Management plans discussed with the patient, nursing and they are in agreement.  Consultants:    Procedures:  Antimicrobials:   Data Reviewed: I have personally reviewed following labs and imaging studies  CBC: Recent Labs  Lab 07/24/21 0657 07/25/21 0108  WBC 4.2 4.6  HGB 11.2* 12.0  HCT 36.5 38.4  MCV 81.7 79.8*  PLT 161 [id number]    Basic Metabolic Panel: Recent Labs  Lab 07/24/21 0657 07/25/21 0108 07/26/21 0031  NA 135 136 134*  K 4.8 4.2 3.7  CL 103 105 104  CO2 20* 20* 20*  GLUCOSE 113* 124* 95  BUN 50* 49* 47*  CREATININE 2.30* 2.15* 1.90*  CALCIUM 10.2 9.9 9.7  MG  --   --  1.9    GFR: Estimated Creatinine Clearance: 21.7 mL/min (A) (by C-G formula based on SCr of 1.9 mg/dL (H)). Liver Function Tests: Recent Labs  Lab 07/24/21 0657  AST 37  ALT 10  ALKPHOS 37*   BILITOT 1.3*  PROT 6.9  ALBUMIN 3.3*    Recent Labs  Lab 07/24/21 0657  LIPASE 35    No results for input(s): AMMONIA in the last 168 hours. Coagulation Profile: No results for input(s): INR, PROTIME in the last 168 hours. Cardiac Enzymes: No results for input(s): CKTOTAL, CKMB, CKMBINDEX, TROPONINI in the last 168 hours. BNP (last 3 results) No results for input(s): PROBNP in the last 8760 hours. HbA1C: No results for input(s): HGBA1C in the last 72 hours. CBG: No results for input(s): GLUCAP in the last 168 hours. Lipid Profile: No results for input(s): CHOL, HDL, LDLCALC, TRIG, CHOLHDL, LDLDIRECT in the last 72 hours. Thyroid Function Tests: No results for input(s): TSH, T4TOTAL, FREET4, T3FREE, THYROIDAB in the last 72 hours. Anemia Panel: No results for input(s): VITAMINB12,  FOLATE, FERRITIN, TIBC, IRON, RETICCTPCT in the last 72 hours. Sepsis Labs: Recent Labs  Lab 07/25/21 0108  PROCALCITON <0.10     Recent Results (from the past 240 hour(s))  Urine Culture     Status: Abnormal (Preliminary result)   Collection Time: 07/24/21  6:53 AM   Specimen: Urine, Clean Catch  Result Value Ref Range Status   Specimen Description URINE, CLEAN CATCH  Final   Special Requests NONE  Final   Culture (A)  Final    10,000 COLONIES/mL GRAM NEGATIVE RODS SUSCEPTIBILITIES TO FOLLOW Performed at [facility], [address] [place], [place] [address]    Report Status PENDING  Incomplete  Resp Panel by RT-PCR (Flu A&B, Covid) Nasopharyngeal Swab     Status: None   Collection Time: 07/24/21  5:18 PM   Specimen: Nasopharyngeal Swab; Nasopharyngeal(NP) swabs in vial transport medium  Result Value Ref Range Status   SARS Coronavirus 2 by RT PCR NEGATIVE NEGATIVE Final    Comment: (NOTE) SARS-CoV-2 target nucleic acids are NOT DETECTED.  The SARS-CoV-2 RNA is generally detectable in upper respiratory specimens during the acute phase of infection. The  lowest concentration of SARS-CoV-2 viral copies this assay can detect is 138 copies/mL. A negative result does not preclude SARS-Cov-2 infection and should not be used as the sole basis for treatment or other patient management decisions. A negative result may occur with  improper specimen collection/handling, submission of specimen other than nasopharyngeal swab, presence of viral mutation(s) within the areas targeted by this assay, and inadequate number of viral copies(<138 copies/mL). A negative result must be combined with clinical observations, patient history, and epidemiological information. The expected result is Negative.  Fact Sheet for Patients:  EntrepreneurPulse.com.au  Fact Sheet for Healthcare Providers:  IncredibleEmployment.be  This test is no t yet approved or cleared by the [place] FDA and  has been authorized for detection and/or diagnosis of SARS-CoV-2 by FDA under an Emergency Use Authorization (EUA). This EUA will remain  in effect (meaning this test can be used) for the duration of the COVID-19 declaration under Section 564(b)(1) of the Act, 21 U.S.C.section 360bbb-3(b)(1), unless the authorization is terminated  or revoked sooner.       Influenza A by PCR NEGATIVE NEGATIVE Final   Influenza B by PCR NEGATIVE NEGATIVE Final    Comment: (NOTE) The Xpert Xpress SARS-CoV-2/FLU/RSV plus assay is intended as an aid in the diagnosis of influenza from Nasopharyngeal swab specimens and should not be used as a sole basis for treatment. Nasal washings and aspirates are unacceptable for Xpert Xpress SARS-CoV-2/FLU/RSV testing.  Fact Sheet for Patients: EntrepreneurPulse.com.au  Fact Sheet for Healthcare Providers: IncredibleEmployment.be  This test is not yet approved or cleared by the [place] FDA and has been authorized for detection and/or diagnosis of SARS-CoV-2 by FDA under  an Emergency Use Authorization (EUA). This EUA will remain in effect (meaning this test can be used) for the duration of the COVID-19 declaration under Section 564(b)(1) of the Act, 21 U.S.C. section 360bbb-3(b)(1), unless the authorization is terminated or revoked.  Performed at [facility], [place] [address] [place], [place] [address]   MRSA Next Gen by PCR, Nasal     Status: None   Collection Time: 07/25/21  1:05 AM   Specimen: Nasal Mucosa; Nasal Swab  Result Value Ref Range Status   MRSA by PCR Next Gen NOT DETECTED NOT DETECTED Final    Comment: (NOTE) The GeneXpert MRSA Assay (FDA  approved for NASAL specimens only), is one component of a comprehensive MRSA colonization surveillance program. It is not intended to diagnose MRSA infection nor to guide or monitor treatment for MRSA infections. Test performance is not FDA approved in patients less than 45 years old. Performed at [facility], [place] [address], [place], [place] [address]       Radiology Studies: CT ABDOMEN PELVIS WO CONTRAST  Result Date: 07/24/2021 CLINICAL DATA:  Chest and abdominal pain. EXAM: CT CHEST, ABDOMEN AND PELVIS WITHOUT CONTRAST TECHNIQUE: Multidetector CT imaging of the chest, abdomen and pelvis was performed following the standard protocol without IV contrast. COMPARISON:  CT abdomen 03/26/2021 FINDINGS: CT CHEST FINDINGS Cardiovascular: The heart is enlarged. No pericardial effusion. The pacer wires are stable. Stable tortuosity and calcification of the thoracic aorta. Stable three-vessel coronary artery calcifications. Mediastinum/Nodes: Stable scattered mediastinal and hilar lymph nodes. The esophagus is grossly normal. Lungs/Pleura: Mosaic pattern of ground-glass attenuation in the lungs. This could be due to small airways disease such as respiratory bronchiolitis, reactive airways disease or asthma. Other possibilities would include asymmetric pulmonary edema, cryptogenic organizing  pneumonia and hypersensitivity pneumonitis. No focal airspace consolidation to suggest pneumonia. There are bilateral small pleural effusions with overlying atelectasis. Scattered small pulmonary nodules are noted in both lungs. Some of these could be inflammatory and none measure more than 6 mm. Musculoskeletal: Diffuse body wall edema is noted. No breast masses. 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 Prominent soft tissue noted in the right supraclavicular region. A prior neck CT from 2018 showed this to be tortuous vasculature. No discrete mass. Severe degenerative changes involving the thoracic spine along with a moderate scoliotic curvature. CT ABDOMEN PELVIS FINDINGS Hepatobiliary: No obvious hepatic lesions without contrast. No intrahepatic biliary dilatation. Numerous gallstones are noted in the gallbladder. No common bile duct dilatation. Pancreas: No mass, inflammation or ductal dilatation. Spleen: Normal size.  No focal lesions. Adrenals/Urinary Tract: The adrenal glands are grossly normal in stable. Numerous small simple and hemorrhagic renal cysts. Stable upper pole right renal cysts. The bladder is grossly normal. Stomach/Bowel: The stomach, duodenum, small bowel and colon are grossly normal. No obvious acute inflammatory process, mass lesions or obstructive findings. Stable severe sigmoid colon diverticulosis without definite findings for acute diverticulitis. Vascular/Lymphatic: Stable advanced atherosclerotic calcifications involving the aorta, iliac arteries and branch vessels but no aneurysm. Stable scattered mesenteric and retroperitoneal lymph nodes. Diffuse mesenteric edema. Reproductive: The uterus and ovaries are unremarkable. Other: Diffuse mesenteric edema presacral edema. No significant pelvic fluid collections. No inguinal mass or adenopathy. Musculoskeletal: Severe diffuse body wall edema edema suggesting anasarca.  Severe arthropathic changes involving both hips. No acute bony findings. IMPRESSION: 1. Mosaic pattern of ground-glass attenuation in the lungs could be due to small airways disease such as respiratory bronchiolitis, reactive airways disease or asthma. Other possibilities would include asymmetric pulmonary edema, cryptogenic organizing pneumonia and hypersensitivity pneumonitis. 2. Small bilateral pleural effusions with overlying atelectasis. 3. Stable advanced atherosclerotic calcifications involving the thoracic and abdominal aorta and branch vessels including the coronary arteries. 4. Cholelithiasis. 5. Numerous small simple and hemorrhagic renal cysts. 6. Stable severe sigmoid colon diverticulosis without findings for acute diverticulitis. 7. Severe diffuse body wall edema, mesenteric edema and presacral edema suggesting anasarca. Aortic Atherosclerosis (ICD10-I70.0). Electronically Signed   By: Marijo Sanes M.D.   On: 07/24/2021 19:42   CT Chest Wo Contrast  Result Date: 07/24/2021 CLINICAL DATA:  Chest and abdominal pain. EXAM: CT  CHEST, ABDOMEN AND PELVIS WITHOUT CONTRAST TECHNIQUE: Multidetector CT imaging of the chest, abdomen and pelvis was performed following the standard protocol without IV contrast. COMPARISON:  CT abdomen 03/26/2021 FINDINGS: CT CHEST FINDINGS Cardiovascular: The heart is enlarged. No pericardial effusion. The pacer wires are stable. Stable tortuosity and calcification of the thoracic aorta. Stable three-vessel coronary artery calcifications. Mediastinum/Nodes: Stable scattered mediastinal and hilar lymph nodes. The esophagus is grossly normal. Lungs/Pleura: Mosaic pattern of ground-glass attenuation in the lungs. This could be due to small airways disease such as respiratory bronchiolitis, reactive airways disease or asthma. Other possibilities would include asymmetric pulmonary edema, cryptogenic organizing pneumonia and hypersensitivity pneumonitis. No focal airspace  consolidation to suggest pneumonia. There are bilateral small pleural effusions with overlying atelectasis. Scattered small pulmonary nodules are noted in both lungs. Some of these could be inflammatory and none measure more than 6 mm. Musculoskeletal: Diffuse body wall edema is noted. No breast masses. 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 Prominent soft tissue noted in the right supraclavicular region. A prior neck CT from 2018 showed this to be tortuous vasculature. No discrete mass. Severe degenerative changes involving the thoracic spine along with a moderate scoliotic curvature. CT ABDOMEN PELVIS FINDINGS Hepatobiliary: No obvious hepatic lesions without contrast. No intrahepatic biliary dilatation. Numerous gallstones are noted in the gallbladder. No common bile duct dilatation. Pancreas: No mass, inflammation or ductal dilatation. Spleen: Normal size.  No focal lesions. Adrenals/Urinary Tract: The adrenal glands are grossly normal in stable. Numerous small simple and hemorrhagic renal cysts. Stable upper pole right renal cysts. The bladder is grossly normal. Stomach/Bowel: The stomach, duodenum, small bowel and colon are grossly normal. No obvious acute inflammatory process, mass lesions or obstructive findings. Stable severe sigmoid colon diverticulosis without definite findings for acute diverticulitis. Vascular/Lymphatic: Stable advanced atherosclerotic calcifications involving the aorta, iliac arteries and branch vessels but no aneurysm. Stable scattered mesenteric and retroperitoneal lymph nodes. Diffuse mesenteric edema. Reproductive: The uterus and ovaries are unremarkable. Other: Diffuse mesenteric edema presacral edema. No significant pelvic fluid collections. No inguinal mass or adenopathy. Musculoskeletal: Severe diffuse body wall edema edema suggesting anasarca. Severe arthropathic changes involving both hips. No acute bony  findings. IMPRESSION: 1. Mosaic pattern of ground-glass attenuation in the lungs could be due to small airways disease such as respiratory bronchiolitis, reactive airways disease or asthma. Other possibilities would include asymmetric pulmonary edema, cryptogenic organizing pneumonia and hypersensitivity pneumonitis. 2. Small bilateral pleural effusions with overlying atelectasis. 3. Stable advanced atherosclerotic calcifications involving the thoracic and abdominal aorta and branch vessels including the coronary arteries. 4. Cholelithiasis. 5. Numerous small simple and hemorrhagic renal cysts. 6. Stable severe sigmoid colon diverticulosis without findings for acute diverticulitis. 7. Severe diffuse body wall edema, mesenteric edema and presacral edema suggesting anasarca. Aortic Atherosclerosis (ICD10-I70.0). Electronically Signed   By: Marijo Sanes M.D.   On: 07/24/2021 19:42    Scheduled Meds:  apixaban  2.5 mg Oral BID   docusate sodium  100 mg Oral BID   furosemide  80 mg Intravenous BID   influenza vaccine adjuvanted  0.5 mL Intramuscular Tomorrow-1000   metoprolol succinate  25 mg Oral Daily   pneumococcal 23 valent vaccine  0.5 mL Intramuscular Tomorrow-1000   polyethylene glycol  17 g Oral Daily   rosuvastatin  10 mg Oral QPM   sodium bicarbonate  650 mg Oral BID   Continuous Infusions:   LOS: 1 day   Time spent: 38 minutes. More than 50%  of the time was spent in counseling/coordination of care  Lorella Nimrod, MD [facility]  If 7PM-7AM, please contact night-coverage [email or web address]  07/26/2021, 3:53 PM   This record has been created using Systems analyst. Errors have been sought and corrected,but may not always be located. Such creation errors do not reflect on the standard of care.

## 2021-07-27 ENCOUNTER — Telehealth: Payer: Self-pay

## 2021-07-27 DIAGNOSIS — Z515 Encounter for palliative care: Secondary | ICD-10-CM

## 2021-07-27 DIAGNOSIS — Z7189 Other specified counseling: Secondary | ICD-10-CM

## 2021-07-27 LAB — URINE CULTURE: Culture: 10000 — AB

## 2021-07-27 LAB — BASIC METABOLIC PANEL
Anion gap: 12 (ref 5–15)
BUN: 47 mg/dL — ABNORMAL HIGH (ref 8–23)
CO2: 22 mmol/L (ref 22–32)
Calcium: 9.9 mg/dL (ref 8.9–10.3)
Chloride: 102 mmol/L (ref 98–111)
Creatinine, Ser: 1.94 mg/dL — ABNORMAL HIGH (ref 0.44–1.00)
GFR, Estimated: 26 mL/min — ABNORMAL LOW (ref >60)
Glucose, Bld: 111 mg/dL — ABNORMAL HIGH (ref 70–99)
Potassium: 3.6 mmol/L (ref 3.5–5.1)
Sodium: 136 mmol/L (ref 135–145)

## 2021-07-27 MED ORDER — FUROSEMIDE 10 MG/ML IJ SOLN
120.0000 mg | Freq: Two times a day (BID) | INTRAVENOUS | Status: DC
  Filled 2021-07-27 (×2): qty 12

## 2021-07-27 MED ORDER — FUROSEMIDE 10 MG/ML IJ SOLN
80.0000 mg | Freq: Two times a day (BID) | INTRAMUSCULAR | Status: DC
  Administered 2021-07-27 – 2021-07-28 (×3): 80 mg via INTRAVENOUS
  Filled 2021-07-27 (×3): qty 8

## 2021-07-27 MED ORDER — POTASSIUM CHLORIDE CRYS ER 20 MEQ PO TBCR
40.0000 meq | EXTENDED_RELEASE_TABLET | Freq: Once | ORAL | Status: AC
  Administered 2021-07-27: 40 meq via ORAL
  Filled 2021-07-27: qty 2

## 2021-07-27 MED ORDER — FUROSEMIDE 10 MG/ML IJ SOLN
80.0000 mg | Freq: Once | INTRAMUSCULAR | Status: AC
  Administered 2021-07-27: 80 mg via INTRAVENOUS
  Filled 2021-07-27: qty 8

## 2021-07-27 NOTE — TOC Progression Note (Signed)
Transition of Care [facility]) - Progression Note    Patient Details  Name: Holly [name] MRN: [id number] Date of Birth: [birth year]  Transition of Care [facility]) CM/SW Contact  Zenon Mayo, RN Phone Number: 07/27/2021, 2:20 PM  Clinical Narrative:    Per Velta Addison with [facility] this patient is active with them for Palliative services and now wants Home with Hospice.  [facility] is ordering the DME  that will need to be delivered to home prior to dc.          Expected Discharge Plan and Services                                                 Social Determinants of Health (SDOH) Interventions    Readmission Risk Interventions No flowsheet data found.

## 2021-07-27 NOTE — Telephone Encounter (Signed)
Received message from patient's daughter, Mateo Flow, stating that she would like information on transitioning patient to hospice services. Noted patient is currently in the hospital. Hospital liaisons updated.

## 2021-07-27 NOTE — TOC Transition Note (Addendum)
Transition of Care [facility]) - CM/SW Discharge Note   Patient Details  Name: MAITLAND ROLEN MRN: [id number] Date of Birth: August 18, 1941  [facility]) CM/SW Contact:  Zenon Mayo, RN Phone Number: 07/27/2021, 2:28 PM   Clinical Narrative:    Per Velta Addison with [facility] this patient is active with them for Palliative services and now wants Home with Hospice.  [facility] is ordering the DME  that will need to be delivered to home prior to dc.   Hospital bed and oxygen.  NCM confirmed this information with Mateo Flow the patient's daughter, she states yes this is correct, but she states she does not want the hospital bed but she wants the oxygen and she will call [place] to let her know.  Plan for patient to dc in the am, Daughter Mateo Flow will transport her home via car.     Final next level of care: Home w Hospice Care Barriers to Discharge: Continued Medical Work up   Patient Goals and CMS Choice Patient states their goals for this hospitalization and ongoing recovery are:: home with hospice      Discharge Placement                       Discharge Plan and Services                          HH Arranged: RN [place] Agency:  [facility]) Date HH Agency Contacted: 07/27/21 Time [place]: [place] Representative spoke with at [place]: [place] ([place]) Interventions     Readmission Risk Interventions No flowsheet data found.

## 2021-07-27 NOTE — Progress Notes (Signed)
Manufacturing engineer [facility]) Hospital Liaison: RN note    Notified by Transition of Care Manger, Tomi Bamberger, RN of patient/family request for [facility] services at home after discharge. Chart and patient information under review by [facility] physician. Hospice eligibility confirmed.    Writer spoke with daughter to initiate education related to hospice philosophy, services and team approach to care. Daughter verbalized understanding of information given. Patient will need prescriptions for discharge comfort medications.     DME needs have been discussed, patient currently has the following equipment in the home: walker, 3N1 and W/c.  Patient/family requests the following DME for delivery to the home: oxygen. [place] equipment manager has been notified and will contact DME provider to arrange delivery to the home. Home address has been verified and is correct in the chart. Mateo Flow is the family member to contact to arrange time of delivery.     [facility] Referral Center aware of the above. Please notify [facility] when patient is ready to leave the unit at discharge. (Call [phone] or [phone] after 5pm.) [facility] information and contact numbers given to [facility].      A Please do not hesitate to call with questions.    Thank you,   Farrel Gordon, RN, [facility] Liaison   [phone]

## 2021-07-27 NOTE — Consult Note (Signed)
Consultation Note Date: 07/27/2021   Patient Name: Holly [name]  DOB: 13-Apr-1941  MRN: [id number]  Age / Sex: 80 y.o., female  PCP: Holly Seashore, MD Referring Physician: Lorella Nimrod, MD  Reason for Consultation: Establishing goals of care  HPI/Patient Profile: 80 y.o. female  with past medical history of HFrEF, PPM / AICD, HTN, CKD 4, and PAF on eliquis admitted on 07/24/2021 with shortness of breath and lower extremity edema. Patient has a recent admission for decompensated HFrEF last month.  This admission diagnosed with acute of chronic HF and AKI on CKD. Family expressed interest in hospice during admission. PMT consulted to discuss Holly [name].   Clinical Assessment and Goals of Care: I have reviewed medical records including EPIC notes, labs and imaging, received report from Holly [name], assessed the patient and then met with patient and daughter Holly [name]  to discuss diagnosis prognosis, GOC, EOL wishes, disposition and options.  I introduced Palliative Medicine as specialized medical care for people living with serious illness. It focuses on providing relief from the symptoms and stress of a serious illness. The goal is to improve quality of life for both the patient and the family.  Holly [name] shares they are setup to go home with hospice tomorrow. She shares she would like more fluid off of Holly [name] prior to discharge. We discuss difficulty and balance with fluid removal and kidney function. Discussed that patient continues on lasix for now. We review heart failure and kidney failure and how they affect each other. Holly [name] expresses understanding.  We review hospice philosophy of care. Holly [name] shares they are interested in this and wont to focus on comfort. Patient shares she does not want morphine because she is afraid this will kill her - we discuss careful balance of symptom relief with medication and using lowest effective does.   Patient  and family both share patient just wants to be at home and wants support of hospice at home.   We review that patient remains full code. Encouraged patient/family to consider DNR/DNI status understanding evidenced based poor outcomes in similar hospitalized patients, as the cause of the arrest is likely associated with chronic/terminal disease rather than a reversible acute cardio-pulmonary event. Patient and family disagree and insist on maintaining full code. Patient's other daughter Holly [name] was called by Holly [name] and included in conversation. Holly [name] also requests maintaining full code. We discuss that full code interventions would not reverse patient's illness and would likely be ineffective and lead to prolonging suffering. Family expresses understanding but share they cannot agree to DNR. They tell me they will take time to consider and discuss with other family members.  Discussed with patient/family the importance of continued conversation with family and the medical providers regarding overall plan of care and treatment options, ensuring decisions are within the context of the patient's values and GOCs.    Questions and concerns were addressed. The family was encouraged to call with questions or concerns.   Primary Decision Maker PATIENT joined by 2 adult daughters named as Economist   SUMMARY OF RECOMMENDATIONS   - home with hospice - have discussed above with hospice liaison, patient has been approved for hospice services at home - to remain a full code - continue conversations outpatient with hospice team - patient remains slightly short of breath but does not want to try any opioid for relief - wants to continue lasix  Code Status/Advance Care Planning: Full code     Primary Diagnoses: Present on Admission:  Acute on chronic  HFrEF (heart failure with reduced ejection fraction) (HCC)  AKI (acute kidney injury) ([place])  CKD (chronic kidney disease) stage 4, GFR 15-29 ml/min (HCC)   Paroxysmal A-fib [facility])  Pacemaker: CRT-P Medtronic Device Crtp Percepta Quad Mri 04/23/2021  Essential hypertension   I have reviewed the medical record, interviewed the patient and family, and examined the patient. The following aspects are pertinent.  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   Bronchitis    HFrEF (heart failure with reduced ejection fraction) ([place]) 04/21/2020   Hypertension    ICD: CRTD Medtronic Device Crtp Percepta Quad Mri 04/23/2021 04/23/2021       Social History   Socioeconomic History   Marital status: Divorced    Spouse name: Not on file   Number of children: 7   Years of education: Not on file   Highest education level: Not on file  Occupational History   Not on file  Tobacco Use   Smoking status: Never   Smokeless tobacco: Never  Vaping Use   Vaping Use: Never used  Substance and Sexual Activity   Alcohol use: No   Drug use: Never   Sexual activity: Not on file  Other Topics Concern   Not on file  Social History Narrative   Not on file   Social Determinants of Health   Financial Resource Strain: Medium Risk   Difficulty of Paying Living Expenses: Somewhat hard  Food Insecurity: No Food Insecurity   Worried About Running Out of Food in the Last Year: Never true   Ran Out of Food in the Last Year: Never true  Transportation Needs: No Transportation Needs   Lack of Transportation (Medical): No   Lack of Transportation (Non-Medical): No  Physical Activity: Not on file  Stress: Not on file  Social Connections: Not on file   Family History  Problem Relation Age of Onset   Hypertension Mother    ALS Sister    Heart attack Brother    Diabetes Daughter    Scheduled Meds:  apixaban  2.5 mg Oral BID   docusate sodium  100 mg Oral BID   influenza vaccine adjuvanted  0.5 mL Intramuscular Tomorrow-1000   metoprolol succinate  25 mg Oral Daily   pneumococcal 23 valent vaccine  0.5 mL Intramuscular  Tomorrow-1000   polyethylene glycol  17 g Oral Daily   rosuvastatin  10 mg Oral QPM   sodium bicarbonate  650 mg Oral BID   Continuous Infusions:  furosemide     PRN Meds:.acetaminophen **OR** acetaminophen, ALPRAZolam, pantoprazole Allergies  Allergen Reactions   Losartan Swelling   Entresto [Sacubitril-Valsartan] Itching, Rash and Cough   Lisinopril Cough   Venlafaxine Rash   Review of Systems  Respiratory:  Positive for shortness of breath.   Cardiovascular:  Positive for leg swelling.  Gastrointestinal:  Positive for abdominal distention.   Physical Exam Constitutional:      General: She is not in acute distress. Pulmonary:     Effort: Pulmonary effort is normal.  Abdominal:     Palpations: Abdomen is soft.  Musculoskeletal:     Right lower leg: Edema present.     Left lower leg: Edema present.  Skin:    General: Skin is warm and dry.  Neurological:     Mental Status: She is alert and oriented to person, place, and time.    Vital Signs: BP 110/66 (BP Location: Left Arm)   Pulse 63   Temp Marland Kitchen)  97.5 F (36.4 C) (Oral)   Resp 20   Ht '5\' 4"'  (1.626 m)   Wt 67.2 kg   SpO2 98%   BMI 25.43 kg/m  Pain Scale: 0-10   Pain Score: 0-No pain   SpO2: SpO2: 98 % O2 Device:SpO2: 98 % O2 [name] Rate: .   IO: Intake/output summary:  Intake/Output Summary (Last 24 hours) at 07/27/2021 1624 Last data filed at 07/27/2021 1200 Gross per 24 hour  Intake 300 ml  Output 850 ml  Net -550 ml    LBM: Last BM Date: 07/26/21 Baseline Weight: Weight: 65.5 kg Most recent weight: Weight: 67.2 kg     Palliative Assessment/Data: PPS 40%    Time Total: 60 minutes Greater than 50%  of this time was spent counseling and coordinating care related to the above assessment and plan.  Juel Burrow, DNP, AGNP-C Palliative Medicine Team [phone] Pager: [phone]

## 2021-07-27 NOTE — Progress Notes (Signed)
Pharmacist Heart Failure Core Measure Documentation  Assessment: Holly [name] has an EF documented as <20% on 03/27/21 by ECHO.  Rationale: Heart failure patients with left ventricular systolic dysfunction (LVSD) and an EF < 40% should be prescribed an angiotensin converting enzyme inhibitor (ACEI) or angiotensin receptor blocker (ARB) at discharge unless a contraindication is documented in the medical record.  This patient is not currently on an ACEI or ARB for HF.  This note is being placed in the record in order to provide documentation that a contraindication to the use of these agents is present for this encounter.  ACE Inhibitor or Angiotensin Receptor Blocker is contraindicated (specify all that apply)  '[]'$   ACEI allergy AND ARB allergy '[]'$   Angioedema '[]'$   Moderate or severe aortic stenosis '[]'$   Hyperkalemia '[]'$   Hypotension '[]'$   Renal artery stenosis '[x]'$   Worsening renal function, preexisting renal disease or dysfunction   Marguerite Olea, BCCP Clinical Pharmacist  07/27/2021 2:53 PM   [facility] pharmacy phone numbers are listed on [email or web address]

## 2021-07-27 NOTE — Care Management Important Message (Signed)
Important Message  Patient Details  Name: Holly [name] MRN: [id number] Date of Birth: 02-14-1941   Medicare Important Message Given:  Yes     [name] Montine Circle 07/27/2021, 2:50 PM

## 2021-07-27 NOTE — Progress Notes (Signed)
PROGRESS NOTE    [name]  [id number] DOB: January 06, 1941 DOA: 07/24/2021 PCP: Merrilee Seashore, MD   Brief Narrative: Taken from H&P.  Holly [name] is a 80 y.o. female with medical history significant of HFrEF, PPM / AICD, HTN, CKD 4, PAF on eliquis was admitted from home with complaint of worsening shortness of breath and lower extremity edema.  Patient has a recent admission for decompensated HFrEF last month.  She was discharged on Bumex.  Per patient she was taking her meds.  ROS was positive for orthopnea and exertional dyspnea.  Apparently patient was recently referred for home hospice by PCP, has not established with them yet but daughter would like to start that service once home.  She wants to diuresed her well before discharged.  Daughter is concerned that she is having difficulty breathing while sleeping and asking for home oxygen.  Patient was saturating in high 90s on room air.  Discussed with nursing staff and they were not aware of any nocturnal hypoxia either-patient was able to maintain saturation in 90s overnight. Daughter and patient wants another day of IV diuresis with increased dose of Lasix as she was not making much urine and continues to have significant abdominal wall edema.  Most likely be going home tomorrow with home hospice.  DME has been delivered. Patient and daughter wants to go home with hospice but also remained full code-apparently hospice approved the status. She will remain high risk for readmission.  Subjective: Patient was complaining of flank fullness.  No pain.  She was not making much urine despite giving higher doses of Lasix.  Lower extremity edema improving but significant abdominal wall edema.  Assessment & Plan:   Principal Problem:   Acute on chronic HFrEF (heart failure with reduced ejection fraction) (HCC) Active Problems:   Pacemaker: CRT-P Medtronic Device Crtp Percepta Quad Mri 04/23/2021   Essential hypertension    Paroxysmal A-fib (HCC)   AKI (acute kidney injury) (HCC)   CKD (chronic kidney disease) stage 4, GFR 15-29 ml/min (HCC)  Acute on chronic HFrEF.  Some improvement with IV diuresis.  Chest was clear today.  Continues to have significant lower extremity edema.  Per patient she is not having enough urinary output with current dose of Lasix.  Recorded urine output of 650 mL only.  BNP elevated above 4500. Continue to have weight gain.  Lower extremity edema improving but significant abdominal wall edema. -Give her an extra dose of Lasix 80 mg -Increase Lasix to 120 mg twice daily. -Will increase the dose of Bumex on discharge. -Strict intake and output -Daily weight and BNP  Concern of difficulty breathing while sleeping.  Daughter was concerned that she was having some difficulty breathing while sleeping.  No documented hypoxia.  Discussed with nursing staff and they are not aware of any complaint or nocturnal hypoxia.  Patient was saturating in high 90s on room air which will not make her qualified for home oxygen. -Overnight pulse ox with no hypoxia. -Patient can get home oxygen from hospice care for comfort. -Might need outpatient sleep study.  Concern of pneumonia on chest x-ray.  Bilateral infiltrates most likely secondary to pulmonary edema.  Procalcitonin negative.  She was started on ceftriaxone and Zithromax.  Remained afebrile and no leukocytosis.  Pneumonia ruled out, and antibiotics were discontinued. -Continue to monitor  AKI with CKD stage IV.  Some improvement in creatinine with IV diuresis.  Appears at baseline now. -Monitor renal function. -Avoid nephrotoxins.  Hypertension.  Blood pressure within goal. -Keep holding home Imdur-we will restart once blood pressure started trending up.  Paroxysmal atrial fibrillation. -Continue metoprolol and Eliquis.  Objective: Vitals:   07/27/21 0330 07/27/21 0400 07/27/21 1048 07/27/21 1154  BP: 117/69 109/64 110/66   Pulse: 60 62 63    Resp: '17 20 20   '$ Temp: 98 F (36.7 C) (!) 97.3 F (36.3 C) (!) 97.5 F (36.4 C)   TempSrc: Oral Oral Oral   SpO2: 98% 99% 98%   Weight:  70 kg  67.2 kg  Height:        Intake/Output Summary (Last 24 hours) at 07/27/2021 1657 Last data filed at 07/27/2021 1200 Gross per 24 hour  Intake 300 ml  Output 850 ml  Net -550 ml    Filed Weights   07/26/21 0443 07/27/21 0400 07/27/21 1154  Weight: 66.3 kg 70 kg 67.2 kg    Examination:  General.  Chronically ill-appearing elderly lady, in no acute distress. Pulmonary.  Lungs clear bilaterally, normal respiratory effort. CV.  Regular rate and rhythm, no JVD, rub or murmur. Abdomen.  Soft, nontender, nondistended, BS positive.  Significant abdominal wall edema CNS.  Alert and oriented.  No focal neurologic deficit. Extremities.  Trace LE edema, no cyanosis, pulses intact and symmetrical. Psychiatry.  Judgment and insight appears impaired.  DVT prophylaxis: Eliquis Code Status: Full Family Communication: Discussed with daughter and husband at bedside,  Disposition Plan:  Status is: Inpatient  The patient will require care spanning > 2 midnights and should be moved to inpatient because: Inpatient level of care appropriate due to severity of illness  Dispo: The patient is from: Home              Anticipated d/c is to: Home with hospice              Patient currently is medically stable-wants to have 1 more day of IV diuresis   Difficult to place patient No              Level of care: Progressive  All the records are reviewed and case discussed with Care Management/Social Worker. Management plans discussed with the patient, nursing and they are in agreement.  Consultants:  Palliative care  Procedures:  Antimicrobials:   Data Reviewed: I have personally reviewed following labs and imaging studies  CBC: Recent Labs  Lab 07/24/21 0657 07/25/21 0108  WBC 4.2 4.6  HGB 11.2* 12.0  HCT 36.5 38.4  MCV 81.7 79.8*  PLT 161  [id number]    Basic Metabolic Panel: Recent Labs  Lab 07/24/21 0657 07/25/21 0108 07/26/21 0031 07/27/21 0421  NA 135 136 134* 136  K 4.8 4.2 3.7 3.6  CL 103 105 104 102  CO2 20* 20* 20* 22  GLUCOSE 113* 124* 95 111*  BUN 50* 49* 47* 47*  CREATININE 2.30* 2.15* 1.90* 1.94*  CALCIUM 10.2 9.9 9.7 9.9  MG  --   --  1.9  --     GFR: Estimated Creatinine Clearance: 21.4 mL/min (A) (by C-G formula based on SCr of 1.94 mg/dL (H)). Liver Function Tests: Recent Labs  Lab 07/24/21 0657  AST 37  ALT 10  ALKPHOS 37*  BILITOT 1.3*  PROT 6.9  ALBUMIN 3.3*    Recent Labs  Lab 07/24/21 0657  LIPASE 35    No results for input(s): AMMONIA in the last 168 hours. Coagulation Profile: No results for input(s): INR, PROTIME in the last 168 hours. Cardiac Enzymes: No results for  input(s): CKTOTAL, CKMB, CKMBINDEX, TROPONINI in the last 168 hours. BNP (last 3 results) No results for input(s): PROBNP in the last 8760 hours. HbA1C: No results for input(s): HGBA1C in the last 72 hours. CBG: No results for input(s): GLUCAP in the last 168 hours. Lipid Profile: No results for input(s): CHOL, HDL, LDLCALC, TRIG, CHOLHDL, LDLDIRECT in the last 72 hours. Thyroid Function Tests: No results for input(s): TSH, T4TOTAL, FREET4, T3FREE, THYROIDAB in the last 72 hours. Anemia Panel: No results for input(s): VITAMINB12, FOLATE, FERRITIN, TIBC, IRON, RETICCTPCT in the last 72 hours. Sepsis Labs: Recent Labs  Lab 07/25/21 0108  PROCALCITON <0.10     Recent Results (from the past 240 hour(s))  Urine Culture     Status: Abnormal   Collection Time: 07/24/21  6:53 AM   Specimen: Urine, Clean Catch  Result Value Ref Range Status   Specimen Description URINE, CLEAN CATCH  Final   Special Requests   Final    NONE Performed at [facility], [place] [address], [place], [place] [address]    Culture 10,000 COLONIES/mL PSEUDOMONAS AERUGINOSA (A)  Final   Report Status 07/27/2021 FINAL  Final    Organism ID, Bacteria PSEUDOMONAS AERUGINOSA (A)  Final      Susceptibility   Pseudomonas aeruginosa - MIC*    CEFTAZIDIME <=1 SENSITIVE Sensitive     CIPROFLOXACIN <=0.25 SENSITIVE Sensitive     GENTAMICIN <=1 SENSITIVE Sensitive     IMIPENEM 2 SENSITIVE Sensitive     PIP/TAZO <=4 SENSITIVE Sensitive     CEFEPIME 2 SENSITIVE Sensitive     * 10,000 COLONIES/mL PSEUDOMONAS AERUGINOSA  Resp Panel by RT-PCR (Flu A&B, Covid) Nasopharyngeal Swab     Status: None   Collection Time: 07/24/21  5:18 PM   Specimen: Nasopharyngeal Swab; Nasopharyngeal(NP) swabs in vial transport medium  Result Value Ref Range Status   SARS Coronavirus 2 by RT PCR NEGATIVE NEGATIVE Final    Comment: (NOTE) SARS-CoV-2 target nucleic acids are NOT DETECTED.  The SARS-CoV-2 RNA is generally detectable in upper respiratory specimens during the acute phase of infection. The lowest concentration of SARS-CoV-2 viral copies this assay can detect is 138 copies/mL. A negative result does not preclude SARS-Cov-2 infection and should not be used as the sole basis for treatment or other patient management decisions. A negative result may occur with  improper specimen collection/handling, submission of specimen other than nasopharyngeal swab, presence of viral mutation(s) within the areas targeted by this assay, and inadequate number of viral copies(<138 copies/mL). A negative result must be combined with clinical observations, patient history, and epidemiological information. The expected result is Negative.  Fact Sheet for Patients:  EntrepreneurPulse.com.au  Fact Sheet for Healthcare Providers:  IncredibleEmployment.be  This test is no t yet approved or cleared by the [place] FDA and  has been authorized for detection and/or diagnosis of SARS-CoV-2 by FDA under an Emergency Use Authorization (EUA). This EUA will remain  in effect (meaning this test can be used) for the  duration of the COVID-19 declaration under Section 564(b)(1) of the Act, 21 U.S.C.section 360bbb-3(b)(1), unless the authorization is terminated  or revoked sooner.       Influenza A by PCR NEGATIVE NEGATIVE Final   Influenza B by PCR NEGATIVE NEGATIVE Final    Comment: (NOTE) The Xpert Xpress SARS-CoV-2/FLU/RSV plus assay is intended as an aid in the diagnosis of influenza from Nasopharyngeal swab specimens and should not be used as a sole basis for treatment. Nasal washings  and aspirates are unacceptable for Xpert Xpress SARS-CoV-2/FLU/RSV testing.  Fact Sheet for Patients: EntrepreneurPulse.com.au  Fact Sheet for Healthcare Providers: IncredibleEmployment.be  This test is not yet approved or cleared by the [place] FDA and has been authorized for detection and/or diagnosis of SARS-CoV-2 by FDA under an Emergency Use Authorization (EUA). This EUA will remain in effect (meaning this test can be used) for the duration of the COVID-19 declaration under Section 564(b)(1) of the Act, 21 U.S.C. section 360bbb-3(b)(1), unless the authorization is terminated or revoked.  Performed at [facility], [place] [address], [place], [place] [address]   MRSA Next Gen by PCR, Nasal     Status: None   Collection Time: 07/25/21  1:05 AM   Specimen: Nasal Mucosa; Nasal Swab  Result Value Ref Range Status   MRSA by PCR Next Gen NOT DETECTED NOT DETECTED Final    Comment: (NOTE) The GeneXpert MRSA Assay (FDA approved for NASAL specimens only), is one component of a comprehensive MRSA colonization surveillance program. It is not intended to diagnose MRSA infection nor to guide or monitor treatment for MRSA infections. Test performance is not FDA approved in patients less than 28 years old. Performed at [facility], [place] [address] [place], [place] [address]       Radiology Studies: No results found.  Scheduled Meds:  apixaban  2.5 mg  Oral BID   docusate sodium  100 mg Oral BID   influenza vaccine adjuvanted  0.5 mL Intramuscular Tomorrow-1000   metoprolol succinate  25 mg Oral Daily   pneumococcal 23 valent vaccine  0.5 mL Intramuscular Tomorrow-1000   polyethylene glycol  17 g Oral Daily   rosuvastatin  10 mg Oral QPM   sodium bicarbonate  650 mg Oral BID   Continuous Infusions:  furosemide       LOS: 2 days   Time spent: 40 minutes. More than 50% of the time was spent in counseling/coordination of care  Lorella Nimrod, MD [facility]  If 7PM-7AM, please contact night-coverage [email or web address]  07/27/2021, 4:57 PM   This record has been created using Systems analyst. Errors have been sought and corrected,but may not always be located. Such creation errors do not reflect on the standard of care.

## 2021-07-28 LAB — BASIC METABOLIC PANEL
Anion gap: 8 (ref 5–15)
BUN: 46 mg/dL — ABNORMAL HIGH (ref 8–23)
CO2: 22 mmol/L (ref 22–32)
Calcium: 9.5 mg/dL (ref 8.9–10.3)
Chloride: 104 mmol/L (ref 98–111)
Creatinine, Ser: 1.73 mg/dL — ABNORMAL HIGH (ref 0.44–1.00)
GFR, Estimated: 29 mL/min — ABNORMAL LOW (ref >60)
Glucose, Bld: 92 mg/dL (ref 70–99)
Potassium: 3.6 mmol/L (ref 3.5–5.1)
Sodium: 134 mmol/L — ABNORMAL LOW (ref 135–145)

## 2021-07-28 MED ORDER — BUMETANIDE 2 MG PO TABS: 4.0000 mg | ORAL_TABLET | Freq: Two times a day (BID) | ORAL | 1 refills | Status: AC

## 2021-07-28 NOTE — TOC Transition Note (Signed)
Transition of Care [facility]) - CM/SW Discharge Note   Patient Details  Name: Holly [name] MRN: [id number] Date of Birth: [birth year]  [facility]) CM/SW Contact:  Pollie Friar, RN Phone Number: 07/28/2021, 12:50 PM   Clinical Narrative:    Patient discharging home with hospice services through [place]. Misty with [facility] aware of d/c. Oxygen to be delivered to the home later today. Daughter is in agreement with the plan. Pt not on oxygen at the hospital.  Daughter providing transport home.   Final next level of care: Home w Hospice Care Barriers to Discharge: No Barriers Identified   Patient Goals and CMS Choice Patient states their goals for this hospitalization and ongoing recovery are:: home with hospice      Discharge Placement                       Discharge Plan and Services                          HH Arranged: RN [place] Agency:  Lonia Chimera) Date HH Agency Contacted: 07/27/21 Time [place]: 1428 Representative spoke with at [place]: [place] ([place]) Interventions     Readmission Risk Interventions No flowsheet data found.

## 2021-07-28 NOTE — Discharge Summary (Signed)
Physician Discharge Summary  Holly [name] [id number] DOB: 1941-06-11 DOA: 07/24/2021  PCP: Merrilee Seashore, MD  Admit date: 07/24/2021 Discharge date: 07/28/2021  Admitted From: Home Disposition: Home with hospice  Recommendations for Outpatient Follow-up:  Follow up with PCP in 1-2 weeks Follow-up with cardiology Please obtain BMP/CBC in one week Please follow up on the following pending results: None  Home Health: Yes Equipment/Devices: Home hospice DME Discharge Condition: Stable CODE STATUS: Full Diet recommendation: Heart Healthy / Carb Modified   Brief/Interim Summary: Holly [name] is a 80 y.o. female with medical history significant of HFrEF, PPM / AICD, HTN, CKD 4, PAF on eliquis was admitted from home with complaint of worsening shortness of breath and lower extremity edema.  BNP elevated above 4500.   Patient has a recent admission for decompensated HFrEF last month.  She was discharged on Bumex.  Per patient she was taking her meds.  ROS was positive for orthopnea and exertional dyspnea.   Apparently patient was recently referred for home hospice by PCP, has not established with them yet but daughter would like to start that service once home.  She wants to diuresed her well before discharged.  Patient initially received IV Lasix 80 mg twice daily with not much diuresis.  Diuresis improved with increased dose of Lasix to 120 mg twice daily.  Lower extremity edema resolved but continues to have abdominal wall edema.  Palliative care was also consulted while in the hospital as patient was full code.  Patient wants to stay full code, home hospice services apparently okay with that.  She was discharged home with home hospice services, we increased the dose of Bumex from 2 mg twice daily to 4 mg twice daily.  She will follow-up with her cardiologist for further recommendations.  Daughter was concerned that she was having some difficulty breathing while sleeping and asking  for home oxygen.  We did overnight pulse ox which was negative for any hypoxia.  Patient was able to maintain oxygenation in high 90s on room air.  Home oxygen can be provided from hospice services if needed.  On admission there was some concern of pneumonia on chest x-ray due to bilateral infiltrates, most likely secondary to pulmonary edema as procalcitonin was negative and she did not had any other symptoms of presumed pneumonia.  Pneumonia ruled out and her antibiotics were discontinued.  Patient initially had AKI with CKD stage IV.  Some improvement in renal function with diuresis.  They were close to baseline on discharge.  Patient blood pressure remained within goal and her home dose of Imdur was held during hospitalization which can be resumed on discharge.  Patient has an history of paroxysmal atrial fibrillation and will continue metoprolol and Eliquis.  He will continue with rest of her home medications and follow-up with her providers.  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   Complete by: As directed    Diet - low sodium heart healthy   Complete by: As directed    Discharge instructions   Complete by: As directed    It was pleasure taking care of you. We are increasing the dose of Bumex-now you will  take 2- 2 mg tablet twice daily. We are also stopping Bidil as your blood pressure was borderline, follow-up closely with your cardiologist and they can restart it if needed. You can take an extra pill of Bumex if you see a weight gain of 3 pounds in 1 day or 5 pounds in 1 week. Please limit your fluid intake to 1200 ML per day.   Increase activity slowly   Complete by: As directed    Increase activity slowly   Complete by: As directed       Allergies as of 07/28/2021       Reactions   Losartan Swelling   Entresto [sacubitril-valsartan] Itching, Rash, Cough   Lisinopril Cough   Venlafaxine Rash        Medication List     STOP taking these medications    clotrimazole-betamethasone cream Commonly known as: LOTRISONE   isosorbide-hydrALAZINE 20-37.5 MG tablet Commonly known as: BIDIL       TAKE these medications    acetaminophen 500 MG tablet Commonly known as: TYLENOL Take 500 mg by mouth every 6 (six) hours as needed for mild pain or headache.   ALPRAZolam 0.25 MG tablet Commonly known as: XANAX Take 0.25 mg by mouth daily as needed for anxiety.   apixaban 2.5 MG Tabs tablet Commonly known as: Eliquis Take 1 tablet (2.5 mg total) by mouth 2 (two) times daily.   Biotin 5000 MCG Caps Take 5,000 mcg by mouth daily.   bumetanide 2 MG tablet Commonly known as: BUMEX Take 2 tablets (4 mg total) by mouth 2 (two) times daily. What changed:  medication strength how much to take   clobetasol ointment 0.05 % Commonly known as: TEMOVATE Apply 1 application topically 2 (two) times daily as needed (itching).   Eucrisa 2 % Oint Generic drug: Crisaborole Apply 1 application topically daily as needed (dry skin).   fexofenadine 180 MG tablet Commonly known as: ALLEGRA Take 180 mg by mouth daily as needed for allergies (itching).   metoprolol succinate 50 MG 24 hr tablet Commonly known as: TOPROL-XL Take 0.5 tablets (25 mg total) by mouth daily. Take with or  immediately following a meal.   nitroGLYCERIN 0.4 MG SL tablet Commonly known as: NITROSTAT Place 1 tablet (0.4 mg total) under the tongue every 5 (five) minutes as needed for chest pain.   OLOPATADINE HCL OP Place 1 drop into both eyes 2 (two) times daily.   pantoprazole 40 MG tablet Commonly known as: PROTONIX Take 40 mg by mouth daily as needed (heartburn).   polyethylene glycol 17 g packet Commonly known as: MIRALAX / GLYCOLAX Take 17 g by mouth daily as needed for mild constipation.   potassium chloride 10 MEQ CR capsule Commonly known as: MICRO-K Take 10 mEq by mouth daily.   rosuvastatin 10 MG tablet Commonly known as: CRESTOR TAKE 1 TABLET BY MOUTH EVERY DAY What changed: when to take this   triamcinolone cream 0.1 % Commonly known as: KENALOG Apply topically 2 (two) times daily as needed (rash).   vitamin C 1000 MG tablet Take 1,000 mg  by mouth daily.   Vitamin D (Ergocalciferol) 50 MCG (2000 UT) Caps Take 1 capsule by mouth daily.        Follow-up Information     [facility] Follow up.   Specialty: Hospice and Palliative Medicine Why: Home Hospice Contact information: [place] [address] [phone]               Allergies  Allergen Reactions   Losartan Swelling   Entresto [Sacubitril-Valsartan] Itching, Rash and Cough   Lisinopril Cough   Venlafaxine Rash    Consultations: Palliative care  Procedures/Studies: CT ABDOMEN PELVIS WO CONTRAST  Result Date: 07/24/2021 CLINICAL DATA:  Chest and abdominal pain. EXAM: CT CHEST, ABDOMEN AND PELVIS WITHOUT CONTRAST TECHNIQUE: Multidetector CT imaging of the chest, abdomen and pelvis was performed following the standard protocol without IV contrast. COMPARISON:  CT abdomen 03/26/2021 FINDINGS: CT CHEST FINDINGS Cardiovascular: The heart is enlarged. No pericardial effusion. The pacer wires are stable. Stable tortuosity and calcification of the thoracic aorta.  Stable three-vessel coronary artery calcifications. Mediastinum/Nodes: Stable scattered mediastinal and hilar lymph nodes. The esophagus is grossly normal. Lungs/Pleura: Mosaic pattern of ground-glass attenuation in the lungs. This could be due to small airways disease such as respiratory bronchiolitis, reactive airways disease or asthma. Other possibilities would include asymmetric pulmonary edema, cryptogenic organizing pneumonia and hypersensitivity pneumonitis. No focal airspace consolidation to suggest pneumonia. There are bilateral small pleural effusions with overlying atelectasis. Scattered small pulmonary nodules are noted in both lungs. Some of these could be inflammatory and none measure more than 6 mm. Musculoskeletal: Diffuse body wall edema is noted. No breast masses. 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 Prominent soft tissue noted in the right supraclavicular region. A prior neck CT from 2018 showed this to be tortuous vasculature. No discrete mass. Severe degenerative changes involving the thoracic spine along with a moderate scoliotic curvature. CT ABDOMEN PELVIS FINDINGS Hepatobiliary: No obvious hepatic lesions without contrast. No intrahepatic biliary dilatation. Numerous gallstones are noted in the gallbladder. No common bile duct dilatation. Pancreas: No mass, inflammation or ductal dilatation. Spleen: Normal size.  No focal lesions. Adrenals/Urinary Tract: The adrenal glands are grossly normal in stable. Numerous small simple and hemorrhagic renal cysts. Stable upper pole right renal cysts. The bladder is grossly normal. Stomach/Bowel: The stomach, duodenum, small bowel and colon are grossly normal. No obvious acute inflammatory process, mass lesions or obstructive findings. Stable severe sigmoid colon diverticulosis without definite findings for acute diverticulitis. Vascular/Lymphatic: Stable advanced atherosclerotic  calcifications involving the aorta, iliac arteries and branch vessels but no aneurysm. Stable scattered mesenteric and retroperitoneal lymph nodes. Diffuse mesenteric edema. Reproductive: The uterus and ovaries are unremarkable. Other: Diffuse mesenteric edema presacral edema. No significant pelvic fluid collections. No inguinal mass or adenopathy. Musculoskeletal: Severe diffuse body wall edema edema suggesting anasarca. Severe arthropathic changes involving both hips. No acute bony findings. IMPRESSION: 1. Mosaic pattern of ground-glass attenuation in the lungs could be due to small airways disease such as respiratory bronchiolitis, reactive airways disease or asthma. Other possibilities would include asymmetric pulmonary edema, cryptogenic organizing pneumonia and hypersensitivity pneumonitis. 2. Small bilateral pleural effusions with overlying atelectasis. 3. Stable advanced atherosclerotic calcifications involving the thoracic and abdominal aorta and branch vessels including the coronary arteries. 4. Cholelithiasis. 5. Numerous small simple and hemorrhagic renal cysts. 6. Stable severe sigmoid colon diverticulosis without findings for acute diverticulitis. 7. Severe diffuse body wall edema, mesenteric edema and presacral edema suggesting anasarca.  Aortic Atherosclerosis (ICD10-I70.0). Electronically Signed   By: Marijo Sanes M.D.   On: 07/24/2021 19:42   CT Chest Wo Contrast  Result Date: 07/24/2021 CLINICAL DATA:  Chest and abdominal pain. EXAM: CT CHEST, ABDOMEN AND PELVIS WITHOUT CONTRAST TECHNIQUE: Multidetector CT imaging of the chest, abdomen and pelvis was performed following the standard protocol without IV contrast. COMPARISON:  CT abdomen 03/26/2021 FINDINGS: CT CHEST FINDINGS Cardiovascular: The heart is enlarged. No pericardial effusion. The pacer wires are stable. Stable tortuosity and calcification of the thoracic aorta. Stable three-vessel coronary artery calcifications. Mediastinum/Nodes:  Stable scattered mediastinal and hilar lymph nodes. The esophagus is grossly normal. Lungs/Pleura: Mosaic pattern of ground-glass attenuation in the lungs. This could be due to small airways disease such as respiratory bronchiolitis, reactive airways disease or asthma. Other possibilities would include asymmetric pulmonary edema, cryptogenic organizing pneumonia and hypersensitivity pneumonitis. No focal airspace consolidation to suggest pneumonia. There are bilateral small pleural effusions with overlying atelectasis. Scattered small pulmonary nodules are noted in both lungs. Some of these could be inflammatory and none measure more than 6 mm. Musculoskeletal: Diffuse body wall edema is noted. No breast masses. 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 Prominent soft tissue noted in the right supraclavicular region. A prior neck CT from 2018 showed this to be tortuous vasculature. No discrete mass. Severe degenerative changes involving the thoracic spine along with a moderate scoliotic curvature. CT ABDOMEN PELVIS FINDINGS Hepatobiliary: No obvious hepatic lesions without contrast. No intrahepatic biliary dilatation. Numerous gallstones are noted in the gallbladder. No common bile duct dilatation. Pancreas: No mass, inflammation or ductal dilatation. Spleen: Normal size.  No focal lesions. Adrenals/Urinary Tract: The adrenal glands are grossly normal in stable. Numerous small simple and hemorrhagic renal cysts. Stable upper pole right renal cysts. The bladder is grossly normal. Stomach/Bowel: The stomach, duodenum, small bowel and colon are grossly normal. No obvious acute inflammatory process, mass lesions or obstructive findings. Stable severe sigmoid colon diverticulosis without definite findings for acute diverticulitis. Vascular/Lymphatic: Stable advanced atherosclerotic calcifications involving the aorta, iliac arteries and branch vessels but  no aneurysm. Stable scattered mesenteric and retroperitoneal lymph nodes. Diffuse mesenteric edema. Reproductive: The uterus and ovaries are unremarkable. Other: Diffuse mesenteric edema presacral edema. No significant pelvic fluid collections. No inguinal mass or adenopathy. Musculoskeletal: Severe diffuse body wall edema edema suggesting anasarca. Severe arthropathic changes involving both hips. No acute bony findings. IMPRESSION: 1. Mosaic pattern of ground-glass attenuation in the lungs could be due to small airways disease such as respiratory bronchiolitis, reactive airways disease or asthma. Other possibilities would include asymmetric pulmonary edema, cryptogenic organizing pneumonia and hypersensitivity pneumonitis. 2. Small bilateral pleural effusions with overlying atelectasis. 3. Stable advanced atherosclerotic calcifications involving the thoracic and abdominal aorta and branch vessels including the coronary arteries. 4. Cholelithiasis. 5. Numerous small simple and hemorrhagic renal cysts. 6. Stable severe sigmoid colon diverticulosis without findings for acute diverticulitis. 7. Severe diffuse body wall edema, mesenteric edema and presacral edema suggesting anasarca. Aortic Atherosclerosis (ICD10-I70.0). Electronically Signed   By: Marijo Sanes M.D.   On: 07/24/2021 19:42    Subjective: Patient was seen and examined today.  Son and daughter at bedside.  Denies any new complaints. Had little improved urinary output with extra dose of Lasix.  She is ready to go home with hospice which has been set up at home.  Daughter has a lot of questions regarding hospice care versus palliative care, answered them to the best of my  ability.  Discharge Exam: Vitals:   07/28/21 0322 07/28/21 0738  BP: 101/66 103/66  Pulse: 67 68  Resp: 15 11  Temp: 97.6 F (36.4 C) (!) 97.5 F (36.4 C)  SpO2: 95% 100%   Vitals:   07/27/21 1934 07/28/21 0001 07/28/21 0322 07/28/21 0738  BP: 112/70 97/73 101/66 103/66   Pulse: 63 60 67 68  Resp: '18 14 15 11  '$ Temp: 97.8 F (36.6 C) 97.9 F (36.6 C) 97.6 F (36.4 C) (!) 97.5 F (36.4 C)  TempSrc: Oral Oral Oral Oral  SpO2: 99% 100% 95% 100%  Weight:   66.6 kg   Height:        General: Pt is alert, awake, not in acute distress Cardiovascular: RRR, S1/S2 +, no rubs, no gallops Respiratory: CTA bilaterally, no wheezing, no rhonchi Abdominal: Soft, NT, ND, bowel sounds + ,2+ flank edema Extremities: no LE edema, no cyanosis   The results of significant diagnostics from this hospitalization (including imaging, microbiology, ancillary and laboratory) are listed below for reference.    Microbiology: Recent Results (from the past 240 hour(s))  Urine Culture     Status: Abnormal   Collection Time: 07/24/21  6:53 AM   Specimen: Urine, Clean Catch  Result Value Ref Range Status   Specimen Description URINE, CLEAN CATCH  Final   Special Requests   Final    NONE Performed at [facility], [address], [place], [place] [address]    Culture 10,000 COLONIES/mL PSEUDOMONAS AERUGINOSA (A)  Final   Report Status 07/27/2021 FINAL  Final   Organism ID, Bacteria PSEUDOMONAS AERUGINOSA (A)  Final      Susceptibility   Pseudomonas aeruginosa - MIC*    CEFTAZIDIME <=1 SENSITIVE Sensitive     CIPROFLOXACIN <=0.25 SENSITIVE Sensitive     GENTAMICIN <=1 SENSITIVE Sensitive     IMIPENEM 2 SENSITIVE Sensitive     PIP/TAZO <=4 SENSITIVE Sensitive     CEFEPIME 2 SENSITIVE Sensitive     * 10,000 COLONIES/mL PSEUDOMONAS AERUGINOSA  Resp Panel by RT-PCR (Flu A&B, Covid) Nasopharyngeal Swab     Status: None   Collection Time: 07/24/21  5:18 PM   Specimen: Nasopharyngeal Swab; Nasopharyngeal(NP) swabs in vial transport medium  Result Value Ref Range Status   SARS Coronavirus 2 by RT PCR NEGATIVE NEGATIVE Final    Comment: (NOTE) SARS-CoV-2 target nucleic acids are NOT DETECTED.  The SARS-CoV-2 RNA is generally detectable in upper respiratory specimens  during the acute phase of infection. The lowest concentration of SARS-CoV-2 viral copies this assay can detect is 138 copies/mL. A negative result does not preclude SARS-Cov-2 infection and should not be used as the sole basis for treatment or other patient management decisions. A negative result may occur with  improper specimen collection/handling, submission of specimen other than nasopharyngeal swab, presence of viral mutation(s) within the areas targeted by this assay, and inadequate number of viral copies(<138 copies/mL). A negative result must be combined with clinical observations, patient history, and epidemiological information. The expected result is Negative.  Fact Sheet for Patients:  EntrepreneurPulse.com.au  Fact Sheet for Healthcare Providers:  IncredibleEmployment.be  This test is no t yet approved or cleared by the [place] FDA and  has been authorized for detection and/or diagnosis of SARS-CoV-2 by FDA under an Emergency Use Authorization (EUA). This EUA will remain  in effect (meaning this test can be used) for the duration of the COVID-19 declaration under Section 564(b)(1) of the Act, 21 U.S.C.section 360bbb-3(b)(1),  unless the authorization is terminated  or revoked sooner.       Influenza A by PCR NEGATIVE NEGATIVE Final   Influenza B by PCR NEGATIVE NEGATIVE Final    Comment: (NOTE) The Xpert Xpress SARS-CoV-2/FLU/RSV plus assay is intended as an aid in the diagnosis of influenza from Nasopharyngeal swab specimens and should not be used as a sole basis for treatment. Nasal washings and aspirates are unacceptable for Xpert Xpress SARS-CoV-2/FLU/RSV testing.  Fact Sheet for Patients: EntrepreneurPulse.com.au  Fact Sheet for Healthcare Providers: IncredibleEmployment.be  This test is not yet approved or cleared by the [place] FDA and has been authorized for detection  and/or diagnosis of SARS-CoV-2 by FDA under an Emergency Use Authorization (EUA). This EUA will remain in effect (meaning this test can be used) for the duration of the COVID-19 declaration under Section 564(b)(1) of the Act, 21 U.S.C. section 360bbb-3(b)(1), unless the authorization is terminated or revoked.  Performed at [facility], [place] [address], [place], [place] [address]   MRSA Next Gen by PCR, Nasal     Status: None   Collection Time: 07/25/21  1:05 AM   Specimen: Nasal Mucosa; Nasal Swab  Result Value Ref Range Status   MRSA by PCR Next Gen NOT DETECTED NOT DETECTED Final    Comment: (NOTE) The GeneXpert MRSA Assay (FDA approved for NASAL specimens only), is one component of a comprehensive MRSA colonization surveillance program. It is not intended to diagnose MRSA infection nor to guide or monitor treatment for MRSA infections. Test performance is not FDA approved in patients less than 47 years old. Performed at [facility], [place] [address], [place], [place] [address]      Labs: BNP (last 3 results) Recent Labs    06/15/21 1205 06/19/21 1530 07/24/21 1700  BNP >5000.0* >4,500.0* [id number]*   Basic Metabolic Panel: Recent Labs  Lab 07/24/21 0657 07/25/21 0108 07/26/21 0031 07/27/21 0421 07/28/21 0043  NA 135 136 134* 136 134*  K 4.8 4.2 3.7 3.6 3.6  CL 103 105 104 102 104  CO2 20* 20* 20* 22 22  GLUCOSE 113* 124* 95 111* 92  BUN 50* 49* 47* 47* 46*  CREATININE 2.30* 2.15* 1.90* 1.94* 1.73*  CALCIUM 10.2 9.9 9.7 9.9 9.5  MG  --   --  1.9  --   --    Liver Function Tests: Recent Labs  Lab 07/24/21 0657  AST 37  ALT 10  ALKPHOS 37*  BILITOT 1.3*  PROT 6.9  ALBUMIN 3.3*   Recent Labs  Lab 07/24/21 0657  LIPASE 35   No results for input(s): AMMONIA in the last 168 hours. CBC: Recent Labs  Lab 07/24/21 0657 07/25/21 0108  WBC 4.2 4.6  HGB 11.2* 12.0  HCT 36.5 38.4  MCV 81.7 79.8*  PLT 161 151   Cardiac Enzymes: No  results for input(s): CKTOTAL, CKMB, CKMBINDEX, TROPONINI in the last 168 hours. BNP: Invalid input(s): POCBNP CBG: No results for input(s): GLUCAP in the last 168 hours. D-Dimer No results for input(s): DDIMER in the last 72 hours. Hgb A1c No results for input(s): HGBA1C in the last 72 hours. Lipid Profile No results for input(s): CHOL, HDL, LDLCALC, TRIG, CHOLHDL, LDLDIRECT in the last 72 hours. Thyroid function studies No results for input(s): TSH, T4TOTAL, T3FREE, THYROIDAB in the last 72 hours.  Invalid input(s): FREET3 Anemia work up No results for input(s): VITAMINB12, FOLATE, FERRITIN, TIBC, IRON, RETICCTPCT in the last 72 hours. Urinalysis  Component Value Date/Time   COLORURINE YELLOW 07/24/2021 0653   APPEARANCEUR CLEAR 07/24/2021 0653   APPEARANCEUR Cloudy (A) 06/15/2021 1205   LABSPEC 1.015 07/24/2021 0653   PHURINE 6.0 07/24/2021 0653   GLUCOSEU NEGATIVE 07/24/2021 0653   HGBUR NEGATIVE 07/24/2021 0653   BILIRUBINUR NEGATIVE 07/24/2021 0653   BILIRUBINUR Negative 06/15/2021 1205   KETONESUR NEGATIVE 07/24/2021 0653   PROTEINUR NEGATIVE 07/24/2021 0653   UROBILINOGEN 0.2 07/11/2009 1942   NITRITE NEGATIVE 07/24/2021 0653   LEUKOCYTESUR NEGATIVE 07/24/2021 0653   Sepsis Labs Invalid input(s): PROCALCITONIN,  WBC,  LACTICIDVEN Microbiology Recent Results (from the past 240 hour(s))  Urine Culture     Status: Abnormal   Collection Time: 07/24/21  6:53 AM   Specimen: Urine, Clean Catch  Result Value Ref Range Status   Specimen Description URINE, CLEAN CATCH  Final   Special Requests   Final    NONE Performed at [facility], [place] [address], [place], [place] [address]    Culture 10,000 COLONIES/mL PSEUDOMONAS AERUGINOSA (A)  Final   Report Status 07/27/2021 FINAL  Final   Organism ID, Bacteria PSEUDOMONAS AERUGINOSA (A)  Final      Susceptibility   Pseudomonas aeruginosa - MIC*    CEFTAZIDIME <=1 SENSITIVE Sensitive     CIPROFLOXACIN <=0.25  SENSITIVE Sensitive     GENTAMICIN <=1 SENSITIVE Sensitive     IMIPENEM 2 SENSITIVE Sensitive     PIP/TAZO <=4 SENSITIVE Sensitive     CEFEPIME 2 SENSITIVE Sensitive     * 10,000 COLONIES/mL PSEUDOMONAS AERUGINOSA  Resp Panel by RT-PCR (Flu A&B, Covid) Nasopharyngeal Swab     Status: None   Collection Time: 07/24/21  5:18 PM   Specimen: Nasopharyngeal Swab; Nasopharyngeal(NP) swabs in vial transport medium  Result Value Ref Range Status   SARS Coronavirus 2 by RT PCR NEGATIVE NEGATIVE Final    Comment: (NOTE) SARS-CoV-2 target nucleic acids are NOT DETECTED.  The SARS-CoV-2 RNA is generally detectable in upper respiratory specimens during the acute phase of infection. The lowest concentration of SARS-CoV-2 viral copies this assay can detect is 138 copies/mL. A negative result does not preclude SARS-Cov-2 infection and should not be used as the sole basis for treatment or other patient management decisions. A negative result may occur with  improper specimen collection/handling, submission of specimen other than nasopharyngeal swab, presence of viral mutation(s) within the areas targeted by this assay, and inadequate number of viral copies(<138 copies/mL). A negative result must be combined with clinical observations, patient history, and epidemiological information. The expected result is Negative.  Fact Sheet for Patients:  EntrepreneurPulse.com.au  Fact Sheet for Healthcare Providers:  IncredibleEmployment.be  This test is no t yet approved or cleared by the [place] FDA and  has been authorized for detection and/or diagnosis of SARS-CoV-2 by FDA under an Emergency Use Authorization (EUA). This EUA will remain  in effect (meaning this test can be used) for the duration of the COVID-19 declaration under Section 564(b)(1) of the Act, 21 U.S.C.section 360bbb-3(b)(1), unless the authorization is terminated  or revoked sooner.        Influenza A by PCR NEGATIVE NEGATIVE Final   Influenza B by PCR NEGATIVE NEGATIVE Final    Comment: (NOTE) The Xpert Xpress SARS-CoV-2/FLU/RSV plus assay is intended as an aid in the diagnosis of influenza from Nasopharyngeal swab specimens and should not be used as a sole basis for treatment. Nasal washings and aspirates are unacceptable for Xpert Xpress SARS-CoV-2/FLU/RSV testing.  Fact Sheet  for Patients: EntrepreneurPulse.com.au  Fact Sheet for Healthcare Providers: IncredibleEmployment.be  This test is not yet approved or cleared by the [place] FDA and has been authorized for detection and/or diagnosis of SARS-CoV-2 by FDA under an Emergency Use Authorization (EUA). This EUA will remain in effect (meaning this test can be used) for the duration of the COVID-19 declaration under Section 564(b)(1) of the Act, 21 U.S.C. section 360bbb-3(b)(1), unless the authorization is terminated or revoked.  Performed at [facility], [place] [address], [place], [place] [address]   MRSA Next Gen by PCR, Nasal     Status: None   Collection Time: 07/25/21  1:05 AM   Specimen: Nasal Mucosa; Nasal Swab  Result Value Ref Range Status   MRSA by PCR Next Gen NOT DETECTED NOT DETECTED Final    Comment: (NOTE) The GeneXpert MRSA Assay (FDA approved for NASAL specimens only), is one component of a comprehensive MRSA colonization surveillance program. It is not intended to diagnose MRSA infection nor to guide or monitor treatment for MRSA infections. Test performance is not FDA approved in patients less than 37 years old. Performed at [facility], [place] [address] [place], [place] [address]     Time coordinating discharge: Over 30 minutes  SIGNED:  Lorella Nimrod, MD  [facility] 07/28/2021, 10:36 AM  If 7PM-7AM, please contact night-coverage [email or web address]  This record has been created using Systems analyst. Errors  have been sought and corrected,but may not always be located. Such creation errors do not reflect on the standard of care.

## 2021-07-28 NOTE — Plan of Care (Signed)
  Problem: Education: Goal: Knowledge of General Education information will improve Description: Including pain rating scale, medication(s)/side effects and non-pharmacologic comfort measures Outcome: Adequate for Discharge   Problem: Health Behavior/Discharge Planning: Goal: Ability to manage health-related needs will improve Outcome: Adequate for Discharge   Problem: Clinical Measurements: Goal: Ability to maintain clinical measurements within normal limits will improve Outcome: Adequate for Discharge Goal: Will remain free from infection Outcome: Adequate for Discharge Goal: Diagnostic test results will improve Outcome: Adequate for Discharge Goal: Respiratory complications will improve Outcome: Adequate for Discharge Goal: Cardiovascular complication will be avoided Outcome: Adequate for Discharge   Problem: Activity: Goal: Risk for activity intolerance will decrease Outcome: Adequate for Discharge   Problem: Nutrition: Goal: Adequate nutrition will be maintained Outcome: Adequate for Discharge   Problem: Coping: Goal: Level of anxiety will decrease Outcome: Adequate for Discharge   Problem: Elimination: Goal: Will not experience complications related to bowel motility Outcome: Adequate for Discharge Goal: Will not experience complications related to urinary retention Outcome: Adequate for Discharge   Problem: Pain Managment: Goal: General experience of comfort will improve Outcome: Adequate for Discharge   Problem: Safety: Goal: Ability to remain free from injury will improve Outcome: Adequate for Discharge   Problem: Skin Integrity: Goal: Risk for impaired skin integrity will decrease Outcome: Adequate for Discharge   Problem: Education: Goal: Ability to demonstrate management of disease process will improve Outcome: Adequate for Discharge Goal: Ability to verbalize understanding of medication therapies will improve Outcome: Adequate for Discharge Goal:  Individualized Educational Video(s) Outcome: Adequate for Discharge

## 2021-08-01 ENCOUNTER — Other Ambulatory Visit: Payer: Self-pay | Admitting: Cardiology

## 2021-08-01 DIAGNOSIS — I5020 Unspecified systolic (congestive) heart failure: Secondary | ICD-10-CM

## 2021-08-01 NOTE — Telephone Encounter (Signed)
Can you confirm if she should or shouldn't be on this please?

## 2021-08-02 NOTE — Telephone Encounter (Signed)
She should not be.

## 2021-08-04 ENCOUNTER — Other Ambulatory Visit: Payer: Self-pay | Admitting: Cardiology

## 2021-08-04 DIAGNOSIS — I5023 Acute on chronic systolic (congestive) heart failure: Secondary | ICD-10-CM

## 2021-08-06 ENCOUNTER — Other Ambulatory Visit: Payer: Medicare Other | Admitting: Hospice

## 2021-08-08 DIAGNOSIS — I5021 Acute systolic (congestive) heart failure: Secondary | ICD-10-CM | POA: Diagnosis not present

## 2021-08-08 NOTE — Telephone Encounter (Signed)
Can I send this?

## 2021-08-08 NOTE — Telephone Encounter (Signed)
Has f/u w/CC tomorrow. Will readdress it.  Thanks MJP

## 2021-08-08 NOTE — Progress Notes (Signed)
Subjective:   Holly [name], female    DOB: 1940/12/23, 80 y.o.   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  Patient was diuresed with Lasix IV 120 mg twice daily and discharged on Bumex 4 mg twice daily.  Patient was also discharged with home hospice care, for which her PCP is the physician of record.   Patient presents with her daughter today. Primary concern is that patient is fatigued and has a cough. She continues to take Bumex 4 mg twice daily with additional 1 mg as needed. Patient's daughter inquires about chest x-ray, however suspect volume overload is source of patient's symptoms.   Current Outpatient Medications on File Prior to Visit  Medication Sig Dispense Refill   acetaminophen (TYLENOL) 500 MG tablet Take 500 mg by mouth every 6 (six) hours as needed for mild pain or headache.     apixaban (ELIQUIS) 2.5 MG TABS tablet Take 1 tablet (2.5 mg total) by mouth 2 (two) times daily. 60 tablet 6   Ascorbic Acid (VITAMIN C) 1000 MG tablet Take 1,000 mg by mouth daily.     Biotin 5000 MCG CAPS Take 5,000 mcg by mouth daily.     bumetanide (BUMEX) 2 MG tablet Take 2 tablets (4 mg total) by mouth 2 (two) times daily. 120 tablet 1   clobetasol ointment (TEMOVATE) 7.41 % Apply 1 application topically 2 (two) times daily as needed (itching).   1   diclofenac Sodium (VOLTAREN) 1 % GEL Apply 2 g topically 4 (four) times daily.     docusate sodium (COLACE) 250 MG capsule Take 250 mg by mouth daily.     EUCRISA 2 % OINT Apply 1 application topically daily as needed (dry skin).      fexofenadine (ALLEGRA) 180 MG tablet Take 180 mg by mouth daily as needed for allergies (itching).      metoprolol succinate (TOPROL-XL) 50 MG 24 hr tablet Take 0.5 tablets (25 mg total) by mouth daily. Take with or immediately following a meal. 15 tablet 3   nitroGLYCERIN (NITROSTAT) 0.4 MG SL tablet Place 1 tablet (0.4 mg total) under the tongue every 5 (five) minutes as needed for chest pain. 90 tablet 3   OLOPATADINE HCL OP Place 1 drop into both eyes 2 (two) times daily.     pantoprazole (PROTONIX) 40 MG tablet Take 40 mg by mouth daily as needed (heartburn).     polyethylene glycol (MIRALAX / GLYCOLAX) 17 g packet Take 17 g by mouth daily as needed for mild constipation.     potassium chloride (MICRO-K) 10 MEQ CR capsule Take 10 mEq by mouth daily.     rosuvastatin (CRESTOR) 10 MG tablet TAKE 1 TABLET BY MOUTH EVERY DAY (Patient taking differently: Take 10 mg by mouth every evening.) 90 tablet 2   triamcinolone cream (KENALOG) 0.1 % Apply topically 2 (two) times daily as needed (rash).     Vitamin D, Ergocalciferol, 50 MCG (2000 UT) CAPS Take 1 capsule by mouth daily.     ALPRAZolam (XANAX) 0.25 MG tablet Take 0.25 mg by mouth daily as needed for anxiety. (Patient not taking: Reported on 08/09/2021)     No current facility-administered  medications on file prior to visit.    Cardiovascular & other pertient studies:  EKG 07/04/2021: Ventricualr paced rhythm 63 bpm  Chest x-ray 06/15/2021: There is a multi lead AICD. Mild atelectasis and/or infiltrate is seen within the suprahilar region on the right. There is no evidence of a pleural effusion or pneumothorax. The cardiac silhouette is enlarged and unchanged in size. There is mild calcification of the aortic arch. There is levoscoliosis of the thoracic spine with multilevel degenerative changes. A chronic deformity of the left humeral head is noted.  EKG 06/14/2021: Ventricular paced rhythm  BiV upgrade 04/05/2021: 1.  Left upper extremity venography.  2.  Existing dual-chamber pacemaker generator removal  3.  Coronary sinus lead  addition  EKG 03/27/2021: AV dual paced rhythm  Echocardiogram 03/27/2021: 1. Left ventricular ejection fraction, by estimation, is <20%. The left  ventricle has severely decreased function. The left ventricle demonstrates  global hypokinesis. There is mild left ventricular hypertrophy. Left  ventricular diastolic parameters are  indeterminate.   2. Right ventricular systolic function is normal. The right ventricular  size is normal.   3. Left atrial size was severely dilated.   4. Right atrial size was severely dilated.   5. The mitral valve is normal in structure. Severe mitral valve  regurgitation. No evidence of mitral stenosis.   6. Tricuspid valve regurgitation is severe.   7. The aortic valve is normal in structure. There is mild calcification  of the aortic valve. There is mild thickening of the aortic valve. Aortic  valve regurgitation is trivial. Mild to moderate aortic valve  sclerosis/calcification is present, without  any evidence of aortic stenosis.   8. The inferior vena cava is dilated in size with <50% respiratory  variability, suggesting right atrial pressure of 15 mmHg.  Lexiscan myoview stress test 08/04/2017:  1. Non-diagnostic due to paced rhythm. 2. Study quality: good. The left ventricular chamber dimensions are normal. There is medium scar of the inferolateral segment with moderate to severe residual ischemia. There is small scar of the inferior segment with very mild residual ischemia. 3. Systolic function is mildly reduced. The calculated stress EF is at 44 %. Gated SPECT imaging demonstrates hypokinesis in the inferior, lateral segment(s). 5. Intermediate risk study. Clinical correlation recommended.   Coronary angiogram 11/11/2017: LM: Normal LAD: D1 40% stneoses LCx: Tandem 50% stenoses in mid LCx RCA: Normal   Moderate coronary artery disease. She does have abnormal stress test in inferolateral territory, they are out of proportion to the coronary artery  findings. Furthermore, she does not have any critical unstable lesion, to explain nonsustained ventricular tachycardia. She does not have any ongoing chest pain symptoms at this time.Recommend aggressive medical management at this time. If true anginal symptoms occur, could then perform PCI to mid circumflex.   Pacemaker Implant 07/16/2017:  Medtronic Azure XT DR MRI Sure Scan (serial number [id number]) dual chamber pacemaker 07/16/2017 by [facility] for complete heart block.    Recent labs: CMP Latest Ref Rng & Units 07/28/2021 07/27/2021 07/26/2021  Glucose 70 - 99 mg/dL 92 111(H) 95  BUN 8 - 23 mg/dL 46(H) 47(H) 47(H)  Creatinine 0.44 - 1.00 mg/dL 1.73(H) 1.94(H) 1.90(H)  Sodium 135 - 145 mmol/L 134(L) 136 134(L)  Potassium 3.5 - 5.1 mmol/L 3.6 3.6 3.7  Chloride 98 - 111 mmol/L 104 102 104  CO2 22 - 32 mmol/L 22 22 20(L)  Calcium 8.9 - 10.3 mg/dL 9.5 9.9 9.7  Total Protein 6.5 - 8.1 g/dL - - -  Total Bilirubin 0.3 - 1.2 mg/dL - - -  Alkaline Phos 38 - 126 U/L - - -  AST 15 - 41 U/L - - -  ALT 0 - 44 U/L - - -   CBC Latest Ref Rng & Units 07/25/2021 07/24/2021 06/19/2021  WBC 4.0 - 10.5 K/uL 4.6 4.2 4.6  Hemoglobin 12.0 - 15.0 g/dL 12.0 11.2(L) 11.1(L)  Hematocrit 36.0 - 46.0 % 38.4 36.5 35.5(L)  Platelets 150 - 400 K/uL 151 161 204   Lipid Panel     Component Value Date/Time   CHOL 145 11/01/2019 1126   TRIG 49 11/01/2019 1126   HDL 76 11/01/2019 1126   CHOLHDL 1.9 11/01/2019 1126   LDLCALC 58 11/01/2019 1126   HEMOGLOBIN A1C No results found for: HGBA1C, MPG TSH No results for input(s): TSH in the last 8760 hours.  06/15/2021: BNP >5000 Glucose 112, BUN 72, creatinine 1.94, GFR 26, sodium 139, potassium 4.5 Hgb 11.9, HCT 37.1, MCV 82, platelet 202 Urinalysis positive for bacteria (many)  06/04/2021: Glucose 94, BUN/Cr 39/1.7. EGFR 30. Chol 84, TG 51, HDL 43, LDL 64  04/08/2021: Glucose 94, BUN/Cr 39/1.76. EGFR 29. Na/K 132/4.6.  H/H 10/31. MCV 87. Platelets  180  10/2019: Chol 145, TG 49, HDL 76, LDL 58   Review of Systems  Constitutional: Positive for malaise/fatigue.  Cardiovascular:  Positive for dyspnea on exertion and leg swelling. Negative for chest pain, palpitations and syncope.  Respiratory:  Positive for cough, shortness of breath and wheezing. Negative for hemoptysis.   Skin:                 Vitals:   08/09/21 1117  BP: 103/70  Pulse: (!) 55  Temp: 98 F (36.7 C)  SpO2: 97%     Body mass index is 24.82 kg/m. Filed Weights   08/09/21 1117  Weight: 144 lb 9.6 oz (65.6 kg)      Objective:   Physical Exam Vitals and nursing note reviewed.  Constitutional:      Comments: Appears fatigued  Neck:     Vascular: JVD present.  Cardiovascular:     Rate and Rhythm: Normal rate and regular rhythm.     Heart sounds: Normal heart sounds. No murmur heard. Pulmonary:     Effort: Tachypnea present. No respiratory distress.     Breath sounds: Wheezing and rales (bilateral bases, right worse than left) present.  Musculoskeletal:     Right lower leg: Edema (2+pitting) present.     Left lower leg: Edema (2+ pitting) present.  Neurological:     Mental Status: She is alert.      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  Continue with Bumex 4 mg twice daily, will defer further management of diuretics to hospice care team and PCP.  However will be available for consultation as needed. Discussed at length with patient and her daughter regarding what to expect going forward with comfort care.  Also readdressed CODE STATUS with patient and family, however she wishes to remain full code at this time.  Advised patient to discussed at length with patient and hospice care team.  PAF: Continue eliquis 2.5  mg bid  Do not suspect pneumonia, however given patient's daughter's concern recommend they follow-up with PCP for further evaluation.  Follow-up as needed given the  patient has transition to hospice care.  Patient was seen in collaboration with Dr. Virgina Jock. He also reviewed patient's chart and examined the patient. Dr. Virgina Jock is in agreement of the plan.    Alethia Berthold, PA-C 08/09/2021, 1:07 PM Office: [phone]

## 2021-08-09 ENCOUNTER — Other Ambulatory Visit: Payer: Self-pay | Admitting: Cardiology

## 2021-08-09 ENCOUNTER — Other Ambulatory Visit: Payer: Self-pay

## 2021-08-09 ENCOUNTER — Encounter: Payer: Self-pay | Admitting: Student

## 2021-08-09 ENCOUNTER — Ambulatory Visit: Payer: Medicare Other | Admitting: Student

## 2021-08-09 VITALS — BP 103/70 | HR 55 | Temp 98.0°F | Ht 64.0 in | Wt 144.6 lb

## 2021-08-09 DIAGNOSIS — I480 Paroxysmal atrial fibrillation: Secondary | ICD-10-CM | POA: Diagnosis not present

## 2021-08-09 DIAGNOSIS — I5020 Unspecified systolic (congestive) heart failure: Secondary | ICD-10-CM

## 2021-08-09 DIAGNOSIS — I5022 Chronic systolic (congestive) heart failure: Secondary | ICD-10-CM | POA: Diagnosis not present

## 2021-08-09 DIAGNOSIS — Z515 Encounter for palliative care: Secondary | ICD-10-CM

## 2021-08-10 DIAGNOSIS — I5041 Acute combined systolic (congestive) and diastolic (congestive) heart failure: Secondary | ICD-10-CM | POA: Diagnosis not present

## 2021-08-10 DIAGNOSIS — I129 Hypertensive chronic kidney disease with stage 1 through stage 4 chronic kidney disease, or unspecified chronic kidney disease: Secondary | ICD-10-CM | POA: Diagnosis not present

## 2021-08-10 DIAGNOSIS — E782 Mixed hyperlipidemia: Secondary | ICD-10-CM | POA: Diagnosis not present

## 2021-08-10 DIAGNOSIS — R059 Cough, unspecified: Secondary | ICD-10-CM | POA: Diagnosis not present

## 2021-08-10 NOTE — Telephone Encounter (Signed)
Appointment has been canceled.

## 2021-08-10 NOTE — Telephone Encounter (Signed)
Patient is now hospice. No need to continue in office pacemaker checks. Please cancel check on 10/11

## 2021-08-10 NOTE — Telephone Encounter (Signed)
The appointment on 10/11 is for a pacemaker check

## 2021-08-14 ENCOUNTER — Encounter: Payer: Medicare Other | Admitting: Cardiology

## 2021-08-16 ENCOUNTER — Telehealth: Payer: Self-pay

## 2021-08-16 NOTE — Telephone Encounter (Signed)
Gregary Signs from [facility] called and stated that Salle has been feeling extremely, out of the norm and with some SOB of breath and wants to know if she can transmit remotely to see if you see anything is going on. She would like a call ASAP because family is wanting patient to go to the hospital and she is not sure what to do.   Gregary Signs  [phone]

## 2021-08-16 NOTE — Telephone Encounter (Signed)
Error

## 2021-08-16 NOTE — Telephone Encounter (Signed)
Spoke to [place] on hospice team who requested that I speak to patient's family as they were concerned the patient may need to go to the ED.  Again discussed with daughter Mateo Flow expectations of hospice and that from a cardiac standpoint as discussed at last office agree with hospice care as there is not much we can do for.  Advised patient's family to continue patient care with hospice and PCP.   Cecelia,  Patient's hospice nurse plans to do remote transmission.  Please keep an eye out for this and let me know when we get it so that I may take a look.  Although patient's family and hospice nurse have been counseled that recommendations for management likely will not change.

## 2021-08-16 NOTE — Telephone Encounter (Signed)
Agree 

## 2021-08-16 NOTE — Telephone Encounter (Signed)
Yes ma'am. I will keep an eye out for it, it may takes a couple hours before I receive it.

## 2021-08-20 ENCOUNTER — Ambulatory Visit: Payer: Medicare Other | Admitting: Cardiology

## 2021-08-24 ENCOUNTER — Other Ambulatory Visit: Payer: Self-pay | Admitting: Cardiology

## 2021-08-24 DIAGNOSIS — I5020 Unspecified systolic (congestive) heart failure: Secondary | ICD-10-CM

## 2021-08-28 DIAGNOSIS — I5041 Acute combined systolic (congestive) and diastolic (congestive) heart failure: Secondary | ICD-10-CM | POA: Diagnosis not present

## 2021-08-28 DIAGNOSIS — I10 Essential (primary) hypertension: Secondary | ICD-10-CM | POA: Diagnosis not present

## 2021-08-28 DIAGNOSIS — N1831 Chronic kidney disease, stage 3a: Secondary | ICD-10-CM | POA: Diagnosis not present

## 2021-08-28 DIAGNOSIS — E782 Mixed hyperlipidemia: Secondary | ICD-10-CM | POA: Diagnosis not present

## 2021-08-29 ENCOUNTER — Emergency Department (HOSPITAL_COMMUNITY)

## 2021-08-29 ENCOUNTER — Emergency Department (HOSPITAL_COMMUNITY)
Admission: EM | Admit: 2021-08-29 | Discharge: 2021-08-29 | Disposition: A | Discharge status: Home / Self Care | Attending: Emergency Medicine | Admitting: Emergency Medicine

## 2021-08-29 DIAGNOSIS — I4891 Unspecified atrial fibrillation: Secondary | ICD-10-CM | POA: Diagnosis not present

## 2021-08-29 DIAGNOSIS — R531 Weakness: Secondary | ICD-10-CM

## 2021-08-29 DIAGNOSIS — I5023 Acute on chronic systolic (congestive) heart failure: Secondary | ICD-10-CM | POA: Diagnosis not present

## 2021-08-29 DIAGNOSIS — Z7901 Long term (current) use of anticoagulants: Secondary | ICD-10-CM | POA: Diagnosis not present

## 2021-08-29 DIAGNOSIS — I25119 Atherosclerotic heart disease of native coronary artery with unspecified angina pectoris: Secondary | ICD-10-CM | POA: Diagnosis not present

## 2021-08-29 DIAGNOSIS — R4182 Altered mental status, unspecified: Secondary | ICD-10-CM | POA: Insufficient documentation

## 2021-08-29 DIAGNOSIS — R5383 Other fatigue: Secondary | ICD-10-CM | POA: Diagnosis not present

## 2021-08-29 DIAGNOSIS — Z955 Presence of coronary angioplasty implant and graft: Secondary | ICD-10-CM | POA: Insufficient documentation

## 2021-08-29 DIAGNOSIS — Z79899 Other long term (current) drug therapy: Secondary | ICD-10-CM | POA: Insufficient documentation

## 2021-08-29 DIAGNOSIS — N184 Chronic kidney disease, stage 4 (severe): Secondary | ICD-10-CM | POA: Insufficient documentation

## 2021-08-29 DIAGNOSIS — M7989 Other specified soft tissue disorders: Secondary | ICD-10-CM | POA: Diagnosis present

## 2021-08-29 DIAGNOSIS — E8770 Fluid overload, unspecified: Secondary | ICD-10-CM | POA: Insufficient documentation

## 2021-08-29 DIAGNOSIS — R609 Edema, unspecified: Secondary | ICD-10-CM | POA: Diagnosis not present

## 2021-08-29 DIAGNOSIS — R600 Localized edema: Secondary | ICD-10-CM | POA: Diagnosis not present

## 2021-08-29 DIAGNOSIS — Z743 Need for continuous supervision: Secondary | ICD-10-CM | POA: Diagnosis not present

## 2021-08-29 DIAGNOSIS — Z950 Presence of cardiac pacemaker: Secondary | ICD-10-CM | POA: Diagnosis not present

## 2021-08-29 DIAGNOSIS — I130 Hypertensive heart and chronic kidney disease with heart failure and stage 1 through stage 4 chronic kidney disease, or unspecified chronic kidney disease: Secondary | ICD-10-CM | POA: Diagnosis not present

## 2021-08-29 DIAGNOSIS — R404 Transient alteration of awareness: Secondary | ICD-10-CM | POA: Diagnosis not present

## 2021-08-29 LAB — CBC WITH DIFFERENTIAL/PLATELET
Abs Immature Granulocytes: 0.03 10*3/uL (ref 0.00–0.07)
Basophils Absolute: 0 10*3/uL (ref 0.0–0.1)
Basophils Relative: 0 %
Eosinophils Absolute: 0.1 10*3/uL (ref 0.0–0.5)
Eosinophils Relative: 1 %
HCT: 34.8 % — ABNORMAL LOW (ref 36.0–46.0)
Hemoglobin: 11.4 g/dL — ABNORMAL LOW (ref 12.0–15.0)
Immature Granulocytes: 1 %
Lymphocytes Relative: 10 %
Lymphs Abs: 0.5 10*3/uL — ABNORMAL LOW (ref 0.7–4.0)
MCH: 25.2 pg — ABNORMAL LOW (ref 26.0–34.0)
MCHC: 32.8 g/dL (ref 30.0–36.0)
MCV: 76.8 fL — ABNORMAL LOW (ref 80.0–100.0)
Monocytes Absolute: 0.5 10*3/uL (ref 0.1–1.0)
Monocytes Relative: 10 %
Neutro Abs: 4 10*3/uL (ref 1.7–7.7)
Neutrophils Relative %: 78 %
Platelets: 103 10*3/uL — ABNORMAL LOW (ref 150–400)
RBC: 4.53 MIL/uL (ref 3.87–5.11)
RDW: 22.2 % — ABNORMAL HIGH (ref 11.5–15.5)
Smear Review: DECREASED
WBC: 5.1 10*3/uL (ref 4.0–10.5)
nRBC: 0.4 % — ABNORMAL HIGH (ref 0.0–0.2)

## 2021-08-29 IMAGING — CR DG CHEST 1V
1 series · 1 of 1 positions shown · non-contrast
Comparison: 06/15/2021

CLINICAL DATA: Bilateral leg swelling. Shortness of breath. History
of CHF.

EXAM:
CHEST  1 VIEW

[chest ap]
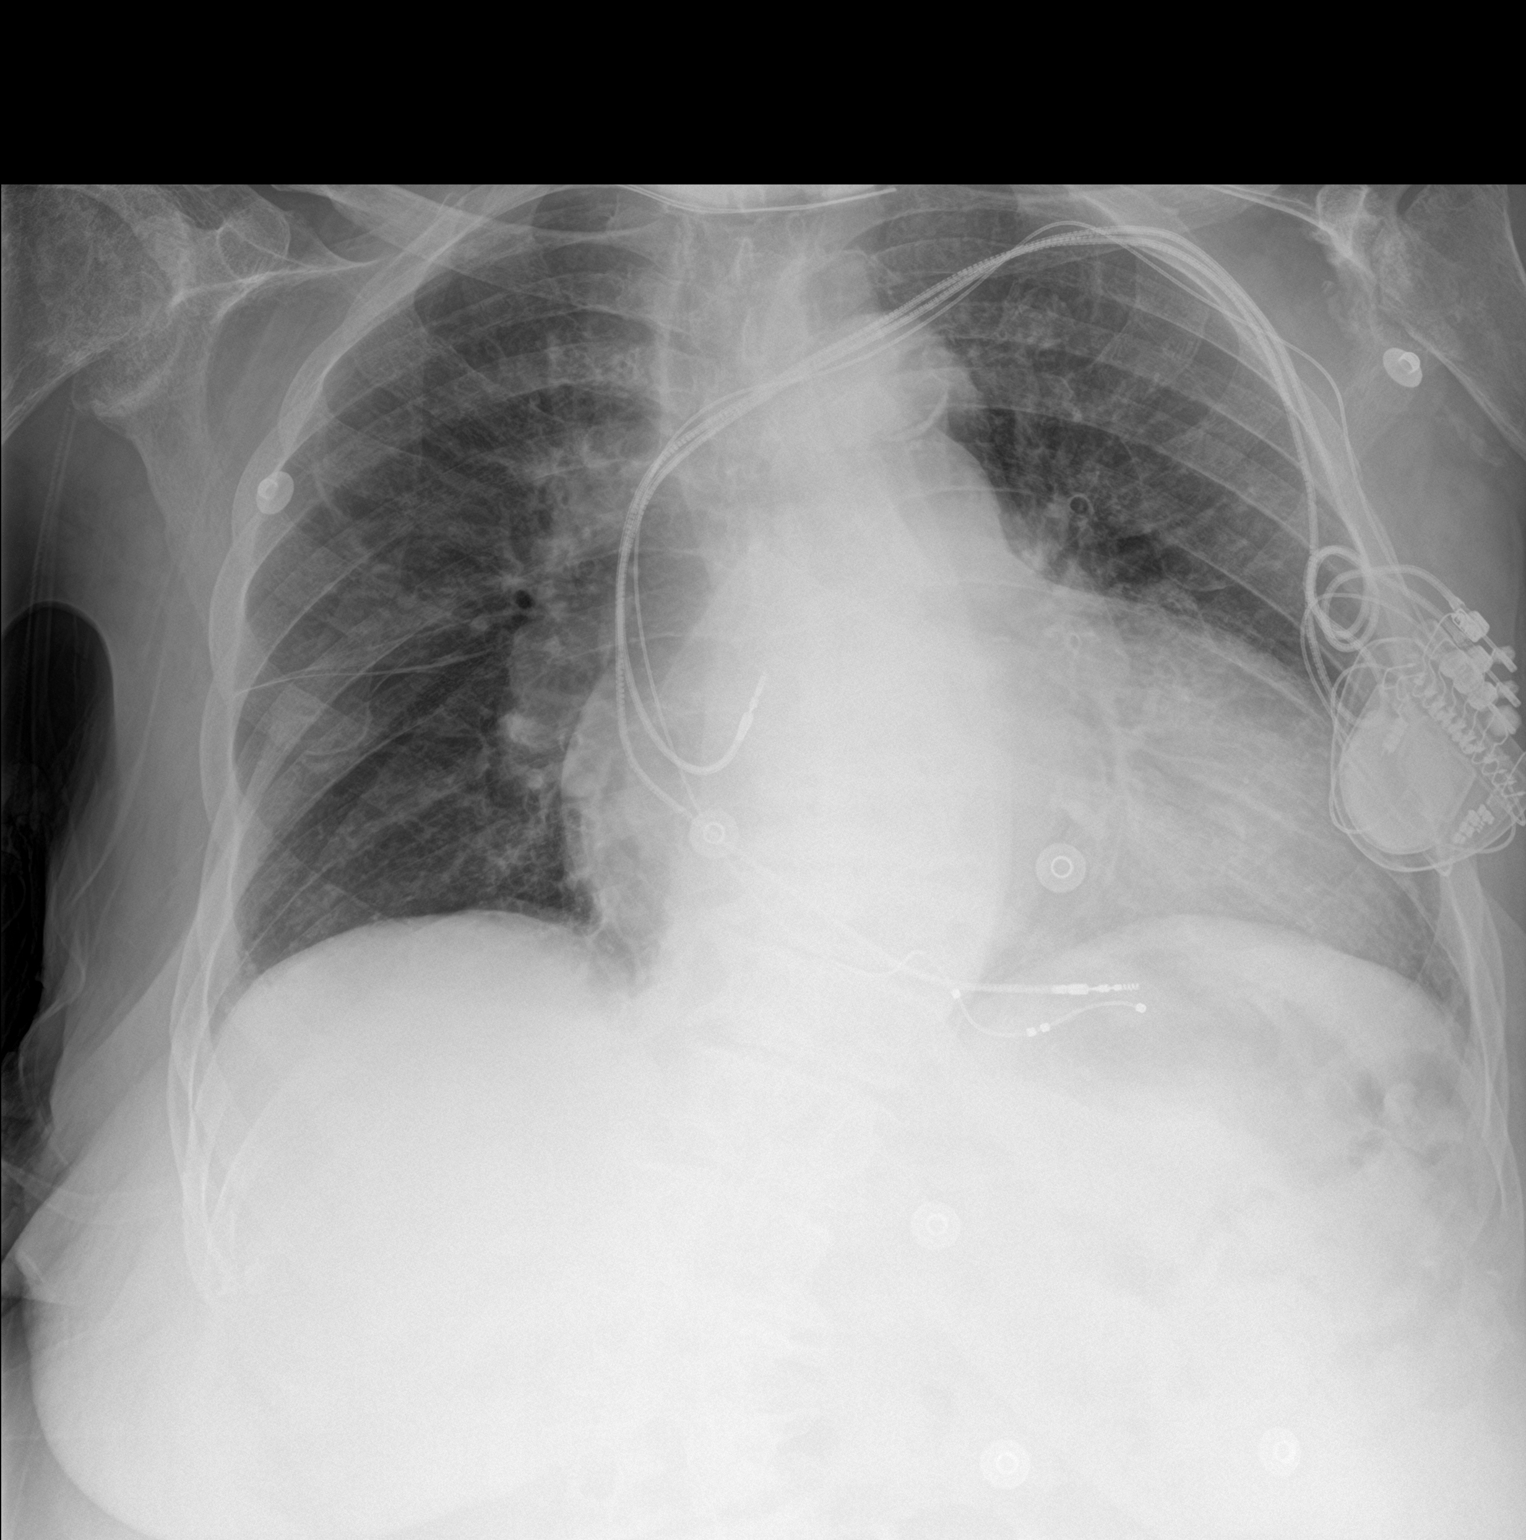

[1 of 1 positions shown; findings below may reference images not displayed]

FINDINGS: Pacer/AICD device. Midline trachea. Apical lordotic positioning.
Moderate cardiomegaly. Atherosclerosis in the transverse aorta. No
pleural effusion or pneumothorax. Pulmonary interstitial prominence
and indistinctness is mild. No lobar consolidation. Degenerative
changes of the right shoulder. Degenerative and possibly
postsurgical changes of the left shoulder.
IMPRESSION: Cardiomegaly and mild congestive heart failure.

Aortic Atherosclerosis ([id number]).

## 2021-08-29 MED ORDER — FUROSEMIDE 10 MG/ML IJ SOLN
20.0000 mg | Freq: Once | INTRAMUSCULAR | Status: AC
  Administered 2021-08-29: 20 mg via INTRAVENOUS
  Filled 2021-08-29: qty 2

## 2021-08-29 MED ORDER — FUROSEMIDE 20 MG PO TABS: 20.0000 mg | ORAL_TABLET | Freq: Once | ORAL | Status: DC

## 2021-08-29 NOTE — ED Notes (Signed)
Pt's family did not want to wait for [facility] any longer and asked to take pt home. This nurse asked if family would be able to assist getting pt into car and be able to get pt out of car once they reached their destination. Family assured that they could help. This nurse and tech attempted to get pt from stretcher to wheelchair while one family member stood in room and the other family member was no longer in room. Family verified that pt would be able to stand and pivot, however pt was unable to bear her weight at all. Unable to move pt from stretcher to wheelchair. Pt placed back on stretcher to await [facility] transport.

## 2021-08-29 NOTE — ED Triage Notes (Incomplete)
Found by family hard to wake this morning. [facility] arrives to find pt lethargic, paced rhythm, leg swelling +3 bilaterally (hx CHF), 2LPM via [place] @ home. Aox2.

## 2021-08-29 NOTE — Discharge Instructions (Addendum)
Follow-up with your hospice care team

## 2021-08-29 NOTE — ED Provider Notes (Signed)
[facility] EMERGENCY DEPARTMENT Provider Note   CSN: [id number] Arrival date & time: 08/29/21  1013     History No chief complaint on file.   Holly [name] is a 80 y.o. female.  HPI  Patient with medical history notable for HFrEF, PPM / AICD, HTN, CKD 4, PAF on eliquis presents with leg swelling and lethargy.  History is provided by the patient's daughter and son who are at bedside.  Patient herself provide some information but she does appear confused from her baseline so level 5 caveat applies.  Patient has been undergoing treatment for heart failure, she has been having bilateral edema and increasing fluid overload.  2 weeks ago she started coughing, her primary care doctor was not sure if this was fluid overload versus a pneumonia.  Per the daughter he was started on antitussive medicine but not on any antibiotics.  Denies any fevers or chills at home.  Over the last week or so the patient has been having a decline in her mentation.  Per the family she is not at her current baseline, she is slower at [employer] and drifts off in the middle of conversation.  Denies any pain anywhere, denies any nausea or vomiting.   Past Medical History:  Diagnosis Date   AV block, 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[place]) 07/15/2017   Bronchitis    HFrEF (heart failure with reduced ejection fraction) ([place]) 04/21/2020   Hypertension    ICD: CRTD Medtronic Device Crtp Percepta Quad Mri 04/23/2021 04/23/2021        Patient Active Problem List   Diagnosis Date Noted   Acute on chronic HFrEF (heart failure with reduced ejection fraction) ([place]) 07/24/2021   Elevated troponin    Acute on chronic systolic heart failure ([place]) 06/19/2021   Prolonged QT interval 06/19/2021   CKD (chronic kidney disease) stage 4, GFR 15-29 ml/min ([place]) 06/19/2021   Hallucination 06/14/2021   Precordial pain 04/26/2021   Pacemaker: CRT-P Medtronic Device Crtp Percepta Quad Mri 04/23/2021 04/23/2021   Weakness generalized     Palliative care by specialist    Malnutrition of moderate degree 03/29/2021   Cholelithiasis 03/27/2021   Congestive heart failure ([place]) 03/27/2021   AKI (acute kidney injury) ([place]) 29/52/8413   Chronic systolic heart failure ([place]) 11/14/2020   Paroxysmal A-fib ([place]) 05/04/2020   HFrEF (heart failure with reduced ejection fraction) ([place]) 24/40/1027   Acute systolic heart failure ([place]) 04/10/2020   Nonrheumatic mitral valve regurgitation 04/10/2020   Elevated brain natriuretic peptide (BNP) level 03/30/2020   Leg edema 03/30/2020   Dyspnea on exertion 03/15/2020   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   NSVT (nonsustained ventricular tachycardia) 09/23/2019   DNR (do not resuscitate) discussion 05/12/2019   Coronary artery disease of native artery of native heart with stable angina pectoris ([place]) 04/24/2019   Essential hypertension 04/24/2019   Abnormal stress test 11/08/2017   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   Unspecified hereditary and idiopathic peripheral neuropathy 08/09/2013   Ulnar neuropathy 08/09/2013   Carpal tunnel syndrome 08/09/2013    Past Surgical History:  Procedure Laterality Date   BIV UPGRADE N/A 04/05/2021   Procedure: BIV UPGRADE;  Surgeon: Vickie Epley, MD;  Location: [facility];  Service: Cardiovascular;  Laterality: N/A;   LEFT HEART CATH AND CORONARY ANGIOGRAPHY N/A 11/11/2017   Procedure: LEFT HEART CATH AND CORONARY ANGIOGRAPHY;  Surgeon: Nigel Mormon, MD;  Location: [facility];  Service: Cardiovascular;  Laterality: N/A;  left leg surgery     PACEMAKER IMPLANT N/A 07/15/2017   Procedure: Pacemaker Implant;  Surgeon: Constance Haw, MD;  Location: [facility];  Service: Cardiovascular;  Laterality: N/A;   UPPER EXTREMITY VENOGRAPHY N/A 04/05/2021   Procedure: UPPER EXTREMITY VENOGRAPHY;  Surgeon: Vickie Epley, MD;  Location: [facility];  Service: Cardiovascular;  Laterality: N/A;     OB  History   No obstetric history on file.     Family History  Problem Relation Age of Onset   Hypertension Mother    ALS Sister    Heart attack Brother    Diabetes Daughter     Social History   Tobacco Use   Smoking status: Never   Smokeless tobacco: Never  Vaping Use   Vaping Use: Never used  Substance Use Topics   Alcohol use: No   Drug use: Never    Home Medications Prior to Admission medications   Medication Sig Start Date End Date Taking? Authorizing Provider  acetaminophen (TYLENOL) 500 MG tablet Take 500 mg by mouth every 6 (six) hours as needed for mild pain or headache.    [provider]  ALPRAZolam Duanne Moron) 0.25 MG tablet Take 0.25 mg by mouth daily as needed for anxiety. Patient not taking: Reported on 08/09/2021 01/14/19   [provider]  apixaban (ELIQUIS) 2.5 MG TABS tablet Take 1 tablet (2.5 mg total) by mouth 2 (two) times daily. 04/10/21   Charlynne Cousins, MD  Ascorbic Acid (VITAMIN C) 1000 MG tablet Take 1,000 mg by mouth daily.    [provider]  Biotin 5000 MCG CAPS Take 5,000 mcg by mouth daily.    [provider]  bumetanide (BUMEX) 2 MG tablet Take 2 tablets (4 mg total) by mouth 2 (two) times daily. 07/28/21   Lorella Nimrod, MD  clobetasol ointment (TEMOVATE) 8.31 % Apply 1 application topically 2 (two) times daily as needed (itching).  08/02/17   [provider]  diclofenac Sodium (VOLTAREN) 1 % GEL Apply 2 g topically 4 (four) times daily.    [provider]  docusate sodium (COLACE) 250 MG capsule Take 250 mg by mouth daily.    [provider]  EUCRISA 2 % OINT Apply 1 application topically daily as needed (dry skin).  01/14/19   [provider]  fexofenadine (ALLEGRA) 180 MG tablet Take 180 mg by mouth daily as needed for allergies (itching).    [provider]  metoprolol succinate (TOPROL-XL) 50 MG 24 hr tablet Take 0.5 tablets (25 mg total) by mouth daily. Take with or  immediately following a meal. 04/08/21   Charlynne Cousins, MD  nitroGLYCERIN (NITROSTAT) 0.4 MG SL tablet Place 1 tablet (0.4 mg total) under the tongue every 5 (five) minutes as needed for chest pain. 09/23/19 09/08/21  Patwardhan, Reynold Bowen, MD  OLOPATADINE HCL OP Place 1 drop into both eyes 2 (two) times daily.    [provider]  pantoprazole (PROTONIX) 40 MG tablet Take 40 mg by mouth daily as needed (heartburn). 04/27/21   [provider]  polyethylene glycol (MIRALAX / GLYCOLAX) 17 g packet Take 17 g by mouth daily as needed for mild constipation.    [provider]  potassium chloride (MICRO-K) 10 MEQ CR capsule Take 10 mEq by mouth daily. 07/18/21   [provider]  rosuvastatin (CRESTOR) 10 MG tablet TAKE 1 TABLET BY MOUTH EVERY DAY Patient taking differently: Take 10 mg by mouth every evening.  03/20/21   Patwardhan, Reynold Bowen, MD  triamcinolone cream (KENALOG) 0.1 % Apply topically 2 (two) times daily as needed (rash). 06/01/20   [provider]  Vitamin D, Ergocalciferol, 50 MCG (2000 UT) CAPS Take 1 capsule by mouth daily.    [provider]    Allergies    Losartan, Entresto [sacubitril-valsartan], Lisinopril, and Venlafaxine  Review of Systems   Review of Systems  Unable to perform ROS: Mental status change  Constitutional:  Positive for fatigue. Negative for fever.  Respiratory:  Positive for cough and shortness of breath.   Cardiovascular:  Positive for leg swelling. Negative for chest pain.  Gastrointestinal:  Negative for abdominal pain, nausea and vomiting.  Genitourinary:  Negative for dysuria.   Physical Exam Updated Vital Signs There were no vitals taken for this visit.  Physical Exam Vitals and nursing note reviewed. Exam conducted with a chaperone present.  Constitutional:      General: She is not in acute distress.    Appearance: Normal appearance.  HENT:     Head: Normocephalic and atraumatic.  Eyes:      General: No scleral icterus.    Extraocular Movements: Extraocular movements intact.     Pupils: Pupils are equal, round, and reactive to light.  Cardiovascular:     Rate and Rhythm: Normal rate and regular rhythm.     Pulses: Normal pulses.  Pulmonary:     Effort: Pulmonary effort is normal.     Breath sounds: Rales present.  Abdominal:     Palpations: Abdomen is soft.     Tenderness: There is no abdominal tenderness.  Musculoskeletal:     Right lower leg: Edema present.     Left lower leg: Edema present.  Skin:    Coloration: Skin is not jaundiced.  Neurological:     Mental Status: She is alert. Mental status is at baseline.     Coordination: Coordination normal.   ED Results / Procedures / Treatments   Labs (all labs ordered are listed, but only abnormal results are displayed) Labs Reviewed - No data to display  EKG None  Radiology No results found.  Procedures Procedures   Medications Ordered in ED Medications - No data to display  ED Course  I have reviewed the triage vital signs and the nursing notes.  Pertinent labs & imaging results that were available during my care of the patient were reviewed by me and considered in my medical decision making (see chart for details).  Clinical Course as of 08/29/21 1327  Wed Aug 29, 2021  1304 Tried calling hospice care - [phone]. VM left [HS]    Clinical Course User Index [HS] Sherrill Raring, PA-C   MDM Rules/Calculators/A&P                           Patient is mildly tachypneic, vitals are stable.  She is chronically short of breath secondary to CHF, on 2 L at home.  She is currently on 2 L now, has not had to increase the dose.  She has been coughing for 2 weeks, x-ray does not show any evidence of pneumonia.  She does appear to be altered, CT head is without any acute findings.  CBC does not show any leukocytosis, patient is not anemic.  Most of the work-up still is pending, but patient still remains altered.  She  is a hospice patient, I did try calling the hospice care and and left voicemails.  So this is a hospice patient, it immediately there was some concern this morning about altered mental status and shortness of breath.  While in the ED both of the symptoms have resolved.  The CT head and chest are without any acute findings.  The CBC does not show a leukocytosis or anemia.  Unfortunately, the other labs hemolyzed.  We discussed engage in shared decision making with family.  Family wants to continue with hospice and desire and dose of IV furosemide while here.  We discussed return precautions and follow-up, patient and family are in agreement to follow-up with hospice.  Patient discharged in stable position.  Discussed HPI, physical exam and plan of care for this patient with attending [facility]. The attending physician evaluated this patient as part of a shared visit and agrees with plan of care.   Final Clinical Impression(s) / ED Diagnoses Final diagnoses:  None    Rx / DC Orders ED Discharge Orders     None        Sherrill Raring, Hershal Coria 08/29/21 1447    Luna Fuse, MD 08/29/21 1525

## 2021-08-29 NOTE — ED Notes (Signed)
[facility] called after this RN and provider

## 2021-08-29 NOTE — ED Notes (Signed)
Called [facility] for pt number 10th on the list

## 2021-08-29 NOTE — ED Notes (Signed)
Another family member arrived and stated that she would be able to take pt home. She stated that she would be able to move pt from stretcher to wheelchair then into car, then into house. Family member provided with a wheelchair

## 2021-08-30 LAB — PATHOLOGIST SMEAR REVIEW

## 2021-09-03 DIAGNOSIS — M159 Polyosteoarthritis, unspecified: Secondary | ICD-10-CM | POA: Diagnosis not present

## 2021-09-03 DIAGNOSIS — N184 Chronic kidney disease, stage 4 (severe): Secondary | ICD-10-CM | POA: Diagnosis not present

## 2021-09-03 DIAGNOSIS — I129 Hypertensive chronic kidney disease with stage 1 through stage 4 chronic kidney disease, or unspecified chronic kidney disease: Secondary | ICD-10-CM | POA: Diagnosis not present

## 2021-09-03 DIAGNOSIS — I5043 Acute on chronic combined systolic (congestive) and diastolic (congestive) heart failure: Secondary | ICD-10-CM | POA: Diagnosis not present

## 2021-09-08 DIAGNOSIS — I5021 Acute systolic (congestive) heart failure: Secondary | ICD-10-CM | POA: Diagnosis not present

## 2021-09-10 ENCOUNTER — Other Ambulatory Visit: Payer: Self-pay | Admitting: Cardiology

## 2021-09-10 DIAGNOSIS — I5020 Unspecified systolic (congestive) heart failure: Secondary | ICD-10-CM

## 2021-10-03 IMAGING — CT CT CHEST W/O CM
2 of 4 series · 13 of 36 positions shown, 16 images · non-contrast
Comparison: CT abdomen 03/26/2021

CLINICAL DATA: Chest and abdominal pain.

EXAM:
CT CHEST, ABDOMEN AND PELVIS WITHOUT CONTRAST
TECHNIQUE: Multidetector CT imaging of the chest, abdomen and pelvis was
performed following the standard protocol without IV contrast.

[Series 3: cap wo 5.0 i31f 2 · axial · 0.90mm/px · z∈[+849,+1314]mm · 10 of 111 slices shown, 13 images]
[im 9/111  mediastinal]
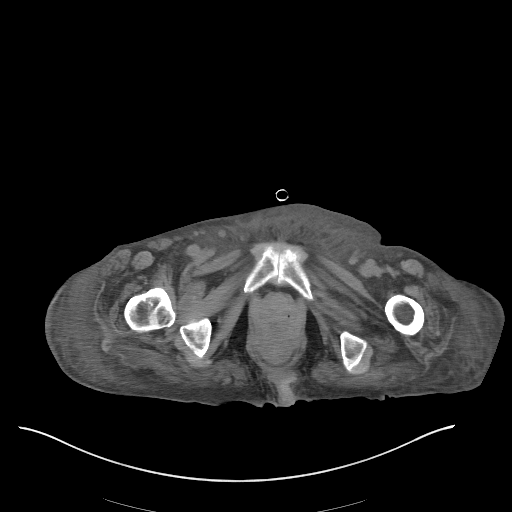
[im 9/111  lung]
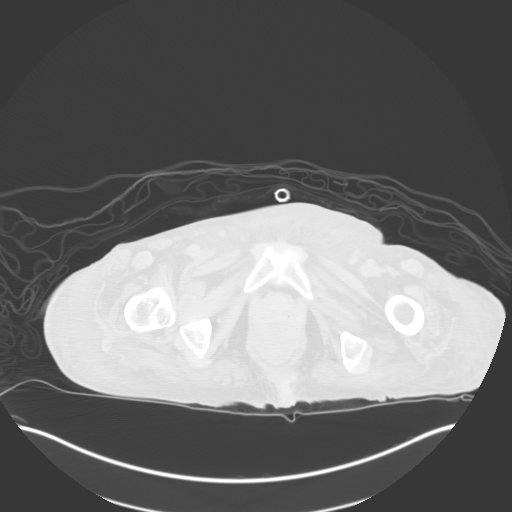
[im 17/111  lung]
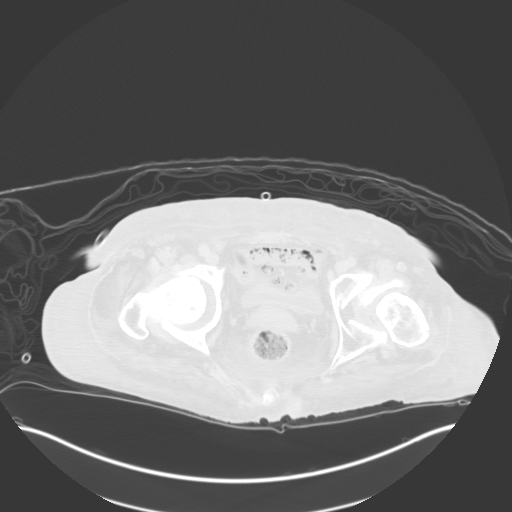
[im 34/111  lung]
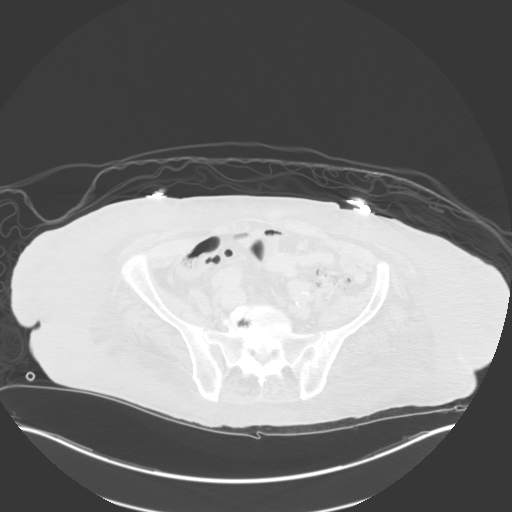
[im 43/111  lung]
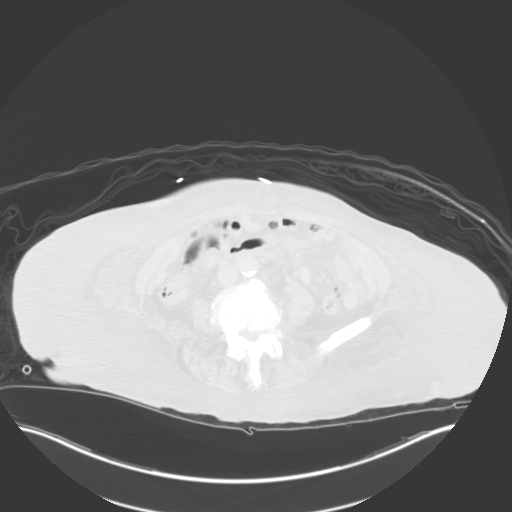
[im 51/111  mediastinal]
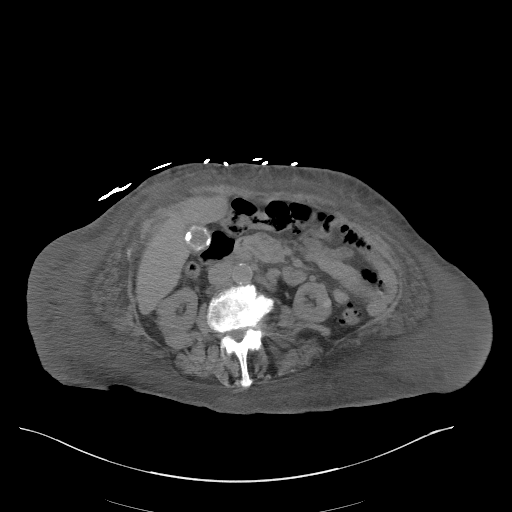
[im 51/111  lung]
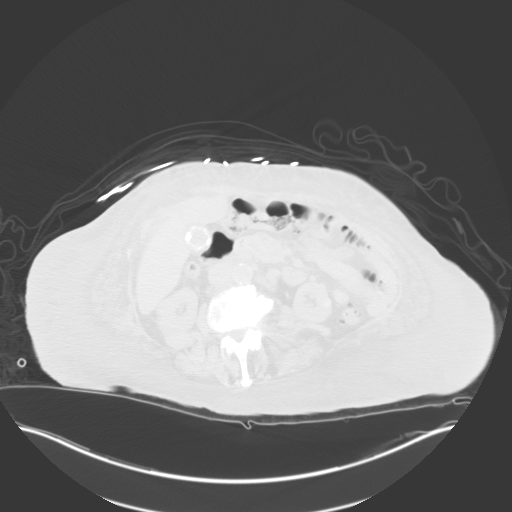
[im 60/111  lung]
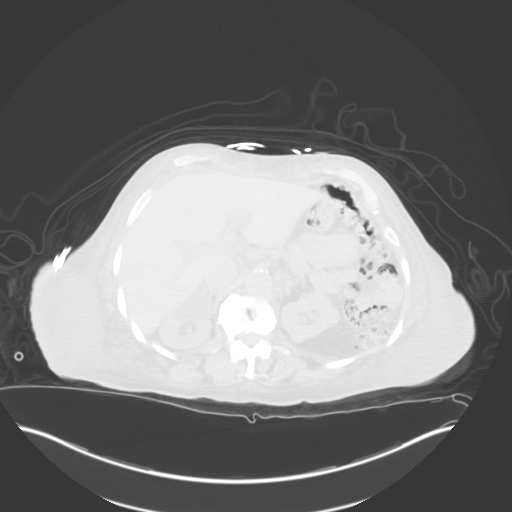
[im 68/111  lung]
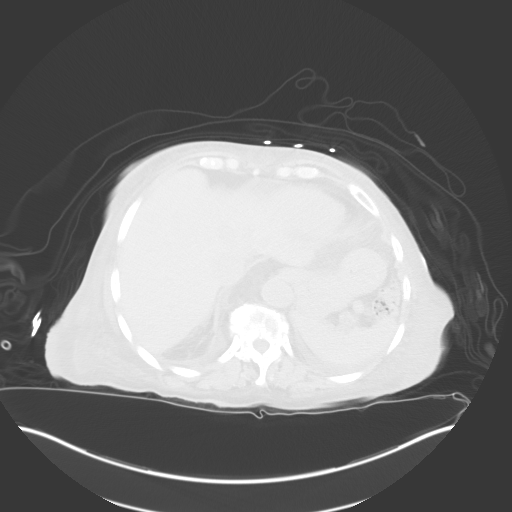
[im 85/111  lung]
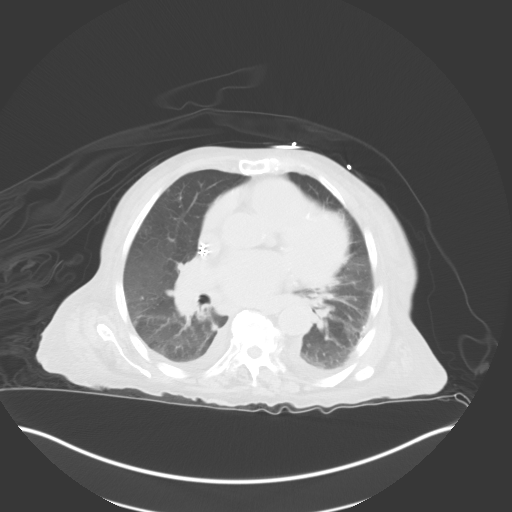
[im 94/111  mediastinal]
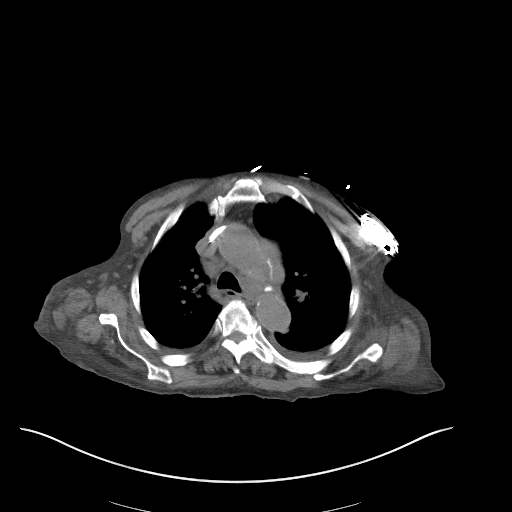
[im 94/111  lung]
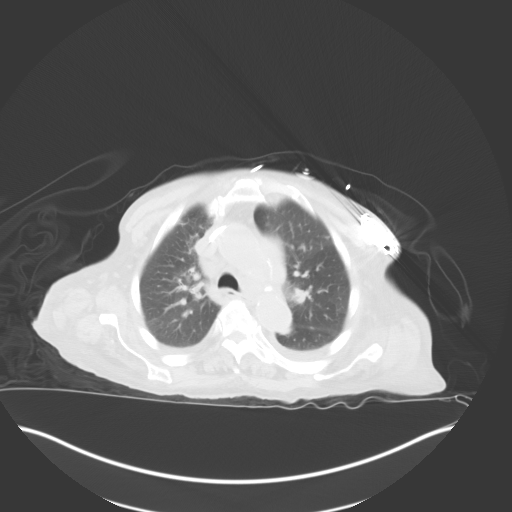
[im 102/111  lung]
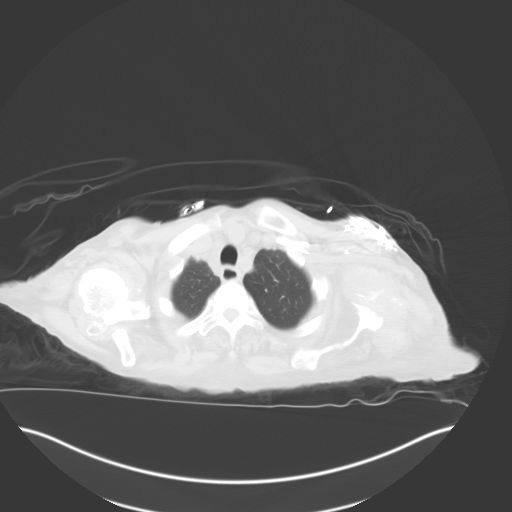

[Series 6: coronal · coronal · 0.87mm/px · 3 of 132 slices shown]
[im 27/132  lung]
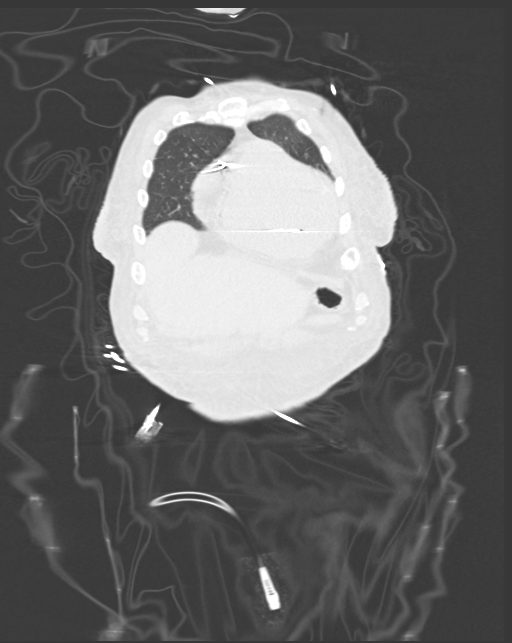
[im 53/132  lung]
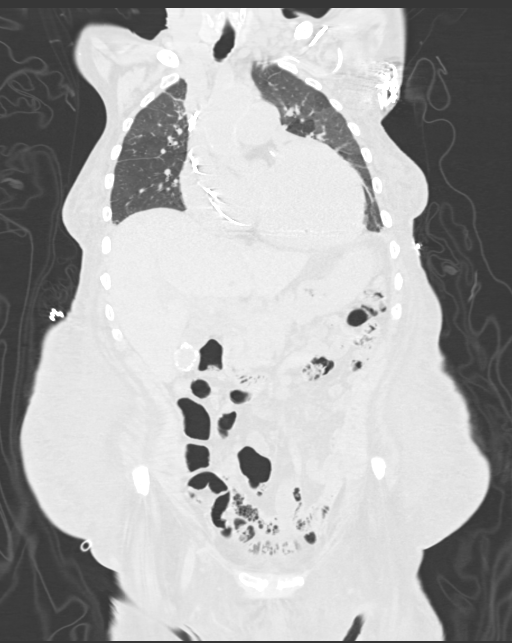
[im 79/132  lung]
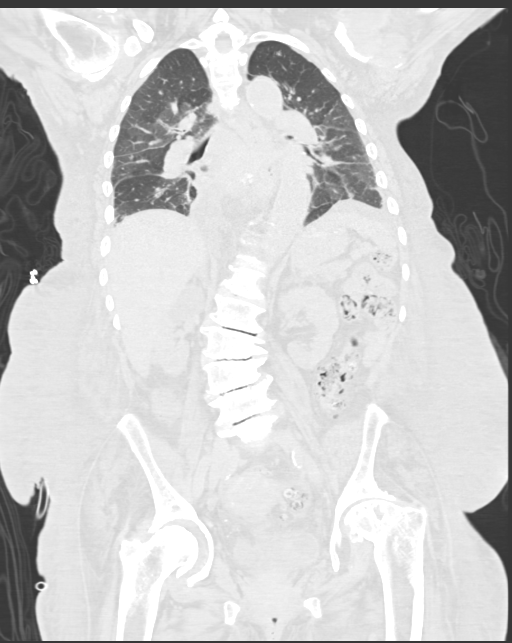

[13 of 36 positions shown; findings below may reference images not displayed]

FINDINGS: CT CHEST FINDINGS

Cardiovascular: The heart is enlarged. No pericardial effusion. The
pacer wires are stable. Stable tortuosity and calcification of the
thoracic aorta. Stable three-vessel coronary artery calcifications.

Mediastinum/Nodes: Stable scattered mediastinal and hilar lymph
nodes. The esophagus is grossly normal.

Lungs/Pleura: Mosaic pattern of ground-glass attenuation in the
lungs. This could be due to small airways disease such as
respiratory bronchiolitis, reactive airways disease or asthma. Other
possibilities would include asymmetric pulmonary edema, cryptogenic
organizing pneumonia and hypersensitivity pneumonitis. No focal
airspace consolidation to suggest pneumonia. There are bilateral
small pleural effusions with overlying atelectasis. Scattered small
pulmonary nodules are noted in both lungs. Some of these could be
inflammatory and none measure more than 6 mm.

Musculoskeletal: Diffuse body wall edema is noted. No breast masses.
Severe arthropathic changes involving both shoulders with marked
erosive changes likely rheumatoid arthritis.

Prominent soft tissue noted in the right supraclavicular region. A
prior neck CT from 6533 showed this to be tortuous vasculature. No
discrete mass. Severe degenerative changes involving the thoracic
spine along with a moderate scoliotic curvature.

CT ABDOMEN PELVIS FINDINGS

Hepatobiliary: No obvious hepatic lesions without contrast. No
intrahepatic biliary dilatation. Numerous gallstones are noted in
the gallbladder. No common bile duct dilatation.

Pancreas: No mass, inflammation or ductal dilatation.

Spleen: Normal size.  No focal lesions.

Adrenals/Urinary Tract: The adrenal glands are grossly normal in
stable.

Numerous small simple and hemorrhagic renal cysts. Stable upper pole
right renal cysts. The bladder is grossly normal.

Stomach/Bowel: The stomach, duodenum, small bowel and colon are
grossly normal. No obvious acute inflammatory process, mass lesions
or obstructive findings. Stable severe sigmoid colon diverticulosis
without definite findings for acute diverticulitis.

Vascular/Lymphatic: Stable advanced atherosclerotic calcifications
involving the aorta, iliac arteries and branch vessels but no
aneurysm. Stable scattered mesenteric and retroperitoneal lymph
nodes. Diffuse mesenteric edema.

Reproductive: The uterus and ovaries are unremarkable.

Other: Diffuse mesenteric edema presacral edema. No significant
pelvic fluid collections. No inguinal mass or adenopathy.

Musculoskeletal: Severe diffuse body wall edema edema suggesting
anasarca.

Severe arthropathic changes involving both hips. No acute bony
findings.
IMPRESSION: 1. Mosaic pattern of ground-glass attenuation in the lungs could be
due to small airways disease such as respiratory bronchiolitis,
reactive airways disease or asthma. Other possibilities would
include asymmetric pulmonary edema, cryptogenic organizing pneumonia
and hypersensitivity pneumonitis.
2. Small bilateral pleural effusions with overlying atelectasis.
3. Stable advanced atherosclerotic calcifications involving the
thoracic and abdominal aorta and branch vessels including the
coronary arteries.
4. Cholelithiasis.
5. Numerous small simple and hemorrhagic renal cysts.
6. Stable severe sigmoid colon diverticulosis without findings for
acute diverticulitis.
7. Severe diffuse body wall edema, mesenteric edema and presacral
edema suggesting anasarca.

Aortic Atherosclerosis (QU5ZI-SSU.U).

## 2021-10-03 IMAGING — CT CT ABD-PELV W/O CM
2 of 4 series · 13 of 36 positions shown, 16 images · non-contrast
Comparison: CT abdomen 03/26/2021

CLINICAL DATA: Chest and abdominal pain.

EXAM:
CT CHEST, ABDOMEN AND PELVIS WITHOUT CONTRAST
TECHNIQUE: Multidetector CT imaging of the chest, abdomen and pelvis was
performed following the standard protocol without IV contrast.

[Series 3: cap wo 5.0 i31f 2 · axial · 0.90mm/px · z∈[+849,+1314]mm · 10 of 111 slices shown, 13 images]
[im 9/111  mediastinal]
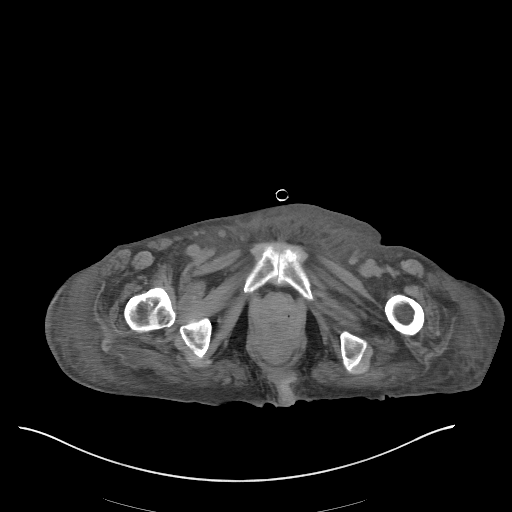
[im 9/111  lung]
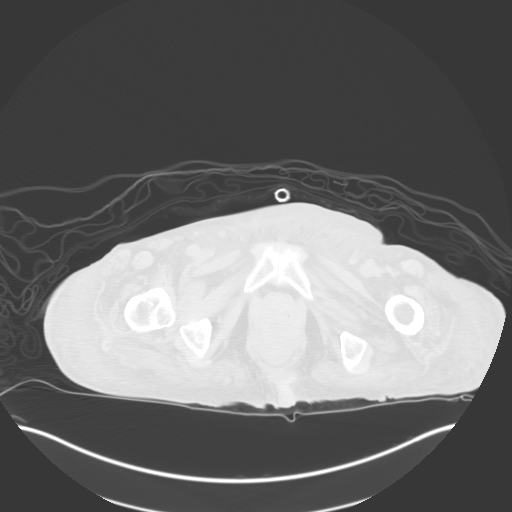
[im 17/111  lung]
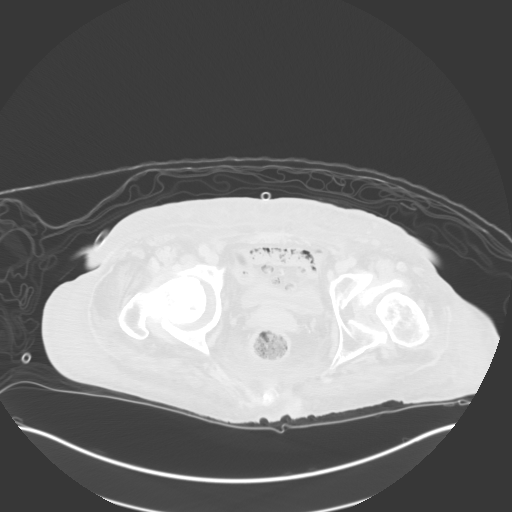
[im 34/111  lung]
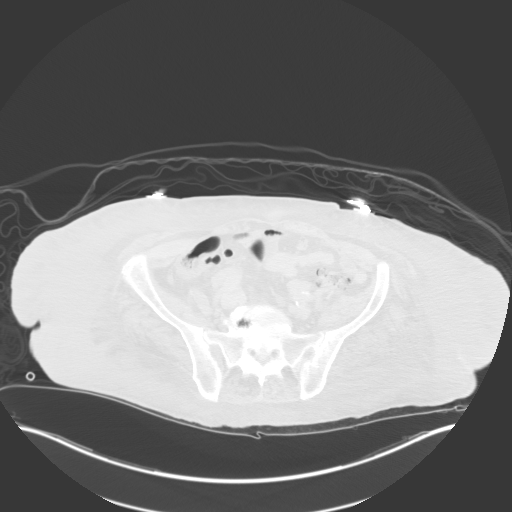
[im 43/111  lung]
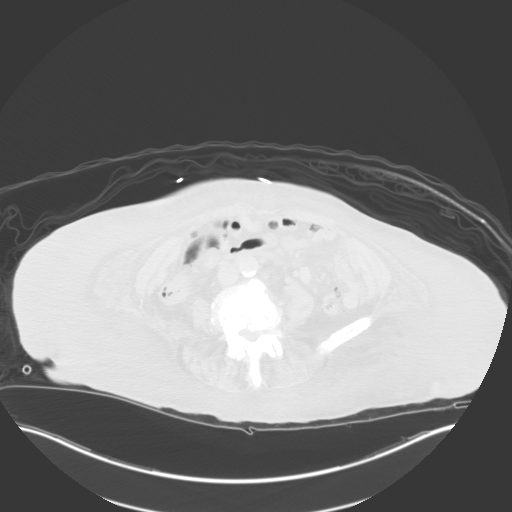
[im 51/111  mediastinal]
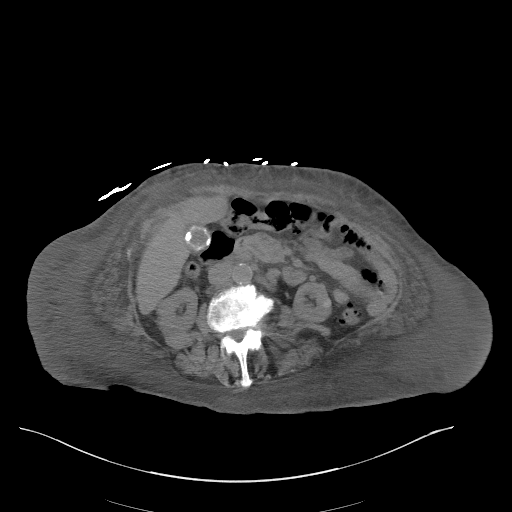
[im 51/111  lung]
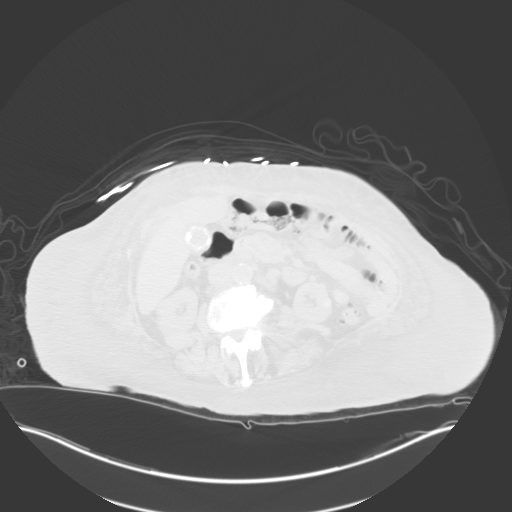
[im 60/111  lung]
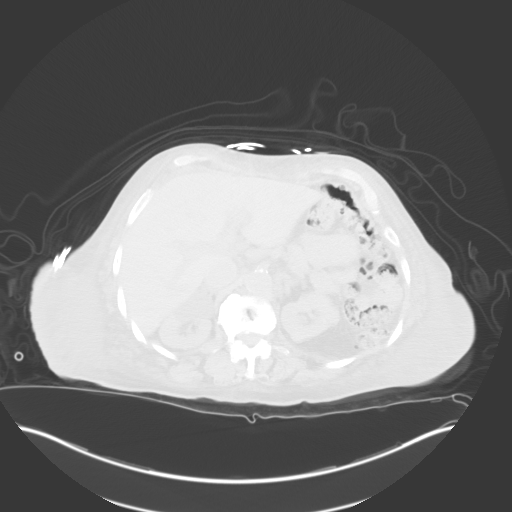
[im 68/111  lung]
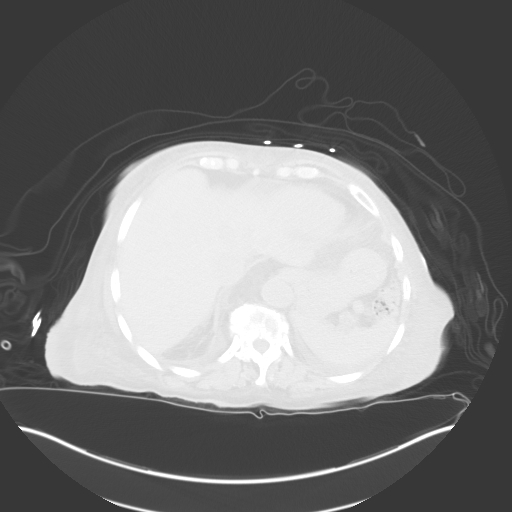
[im 85/111  lung]
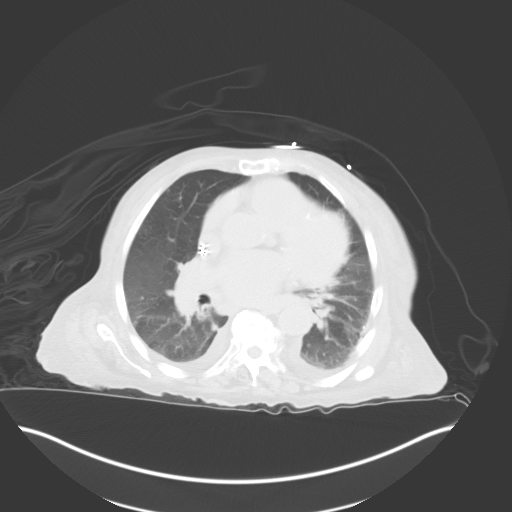
[im 94/111  mediastinal]
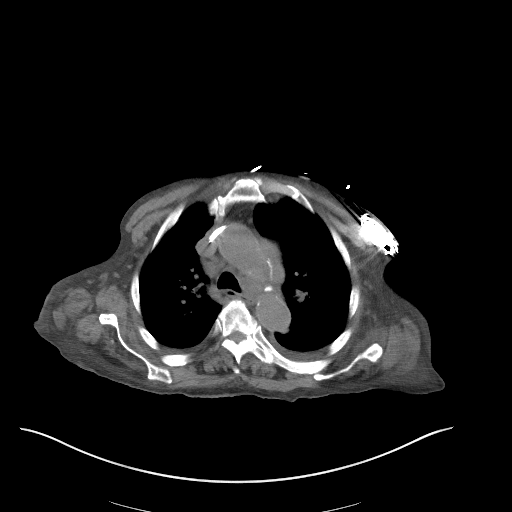
[im 94/111  lung]
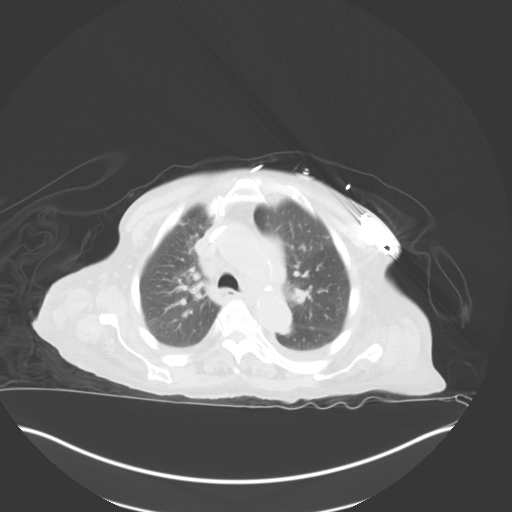
[im 102/111  lung]
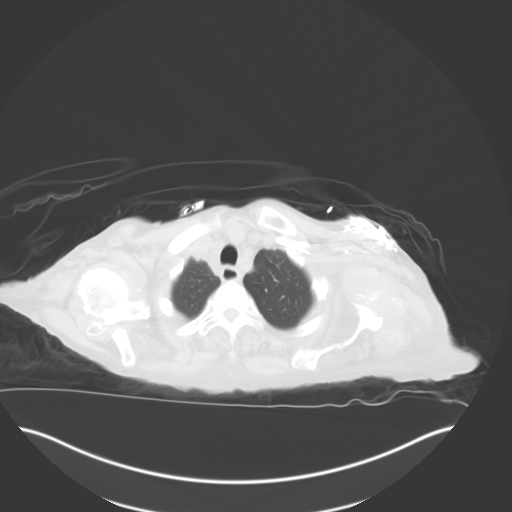

[Series 6: coronal · coronal · 0.87mm/px · 3 of 132 slices shown]
[im 27/132  lung]
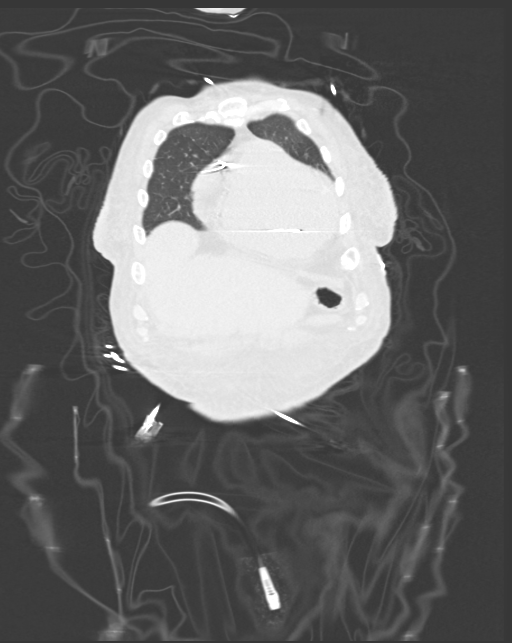
[im 53/132  lung]
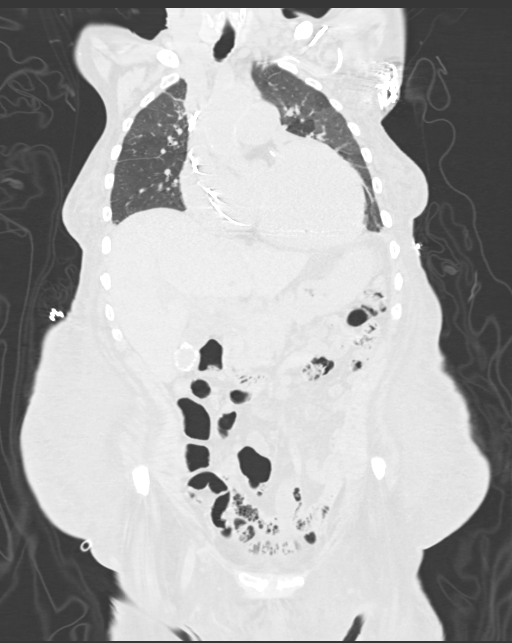
[im 79/132  lung]
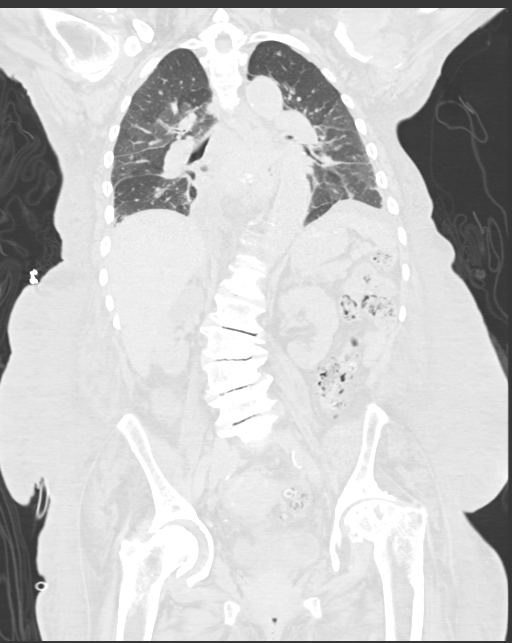

[13 of 36 positions shown; findings below may reference images not displayed]

FINDINGS: CT CHEST FINDINGS

Cardiovascular: The heart is enlarged. No pericardial effusion. The
pacer wires are stable. Stable tortuosity and calcification of the
thoracic aorta. Stable three-vessel coronary artery calcifications.

Mediastinum/Nodes: Stable scattered mediastinal and hilar lymph
nodes. The esophagus is grossly normal.

Lungs/Pleura: Mosaic pattern of ground-glass attenuation in the
lungs. This could be due to small airways disease such as
respiratory bronchiolitis, reactive airways disease or asthma. Other
possibilities would include asymmetric pulmonary edema, cryptogenic
organizing pneumonia and hypersensitivity pneumonitis. No focal
airspace consolidation to suggest pneumonia. There are bilateral
small pleural effusions with overlying atelectasis. Scattered small
pulmonary nodules are noted in both lungs. Some of these could be
inflammatory and none measure more than 6 mm.

Musculoskeletal: Diffuse body wall edema is noted. No breast masses.
Severe arthropathic changes involving both shoulders with marked
erosive changes likely rheumatoid arthritis.

Prominent soft tissue noted in the right supraclavicular region. A
prior neck CT from 6533 showed this to be tortuous vasculature. No
discrete mass. Severe degenerative changes involving the thoracic
spine along with a moderate scoliotic curvature.

CT ABDOMEN PELVIS FINDINGS

Hepatobiliary: No obvious hepatic lesions without contrast. No
intrahepatic biliary dilatation. Numerous gallstones are noted in
the gallbladder. No common bile duct dilatation.

Pancreas: No mass, inflammation or ductal dilatation.

Spleen: Normal size.  No focal lesions.

Adrenals/Urinary Tract: The adrenal glands are grossly normal in
stable.

Numerous small simple and hemorrhagic renal cysts. Stable upper pole
right renal cysts. The bladder is grossly normal.

Stomach/Bowel: The stomach, duodenum, small bowel and colon are
grossly normal. No obvious acute inflammatory process, mass lesions
or obstructive findings. Stable severe sigmoid colon diverticulosis
without definite findings for acute diverticulitis.

Vascular/Lymphatic: Stable advanced atherosclerotic calcifications
involving the aorta, iliac arteries and branch vessels but no
aneurysm. Stable scattered mesenteric and retroperitoneal lymph
nodes. Diffuse mesenteric edema.

Reproductive: The uterus and ovaries are unremarkable.

Other: Diffuse mesenteric edema presacral edema. No significant
pelvic fluid collections. No inguinal mass or adenopathy.

Musculoskeletal: Severe diffuse body wall edema edema suggesting
anasarca.

Severe arthropathic changes involving both hips. No acute bony
findings.
IMPRESSION: 1. Mosaic pattern of ground-glass attenuation in the lungs could be
due to small airways disease such as respiratory bronchiolitis,
reactive airways disease or asthma. Other possibilities would
include asymmetric pulmonary edema, cryptogenic organizing pneumonia
and hypersensitivity pneumonitis.
2. Small bilateral pleural effusions with overlying atelectasis.
3. Stable advanced atherosclerotic calcifications involving the
thoracic and abdominal aorta and branch vessels including the
coronary arteries.
4. Cholelithiasis.
5. Numerous small simple and hemorrhagic renal cysts.
6. Stable severe sigmoid colon diverticulosis without findings for
acute diverticulitis.
7. Severe diffuse body wall edema, mesenteric edema and presacral
edema suggesting anasarca.

Aortic Atherosclerosis (QU5ZI-SSU.U).

## 2021-10-04 DEATH — deceased

## 2023-06-18 NOTE — Telephone Encounter (Signed)
No action done

## 2024-08-20 ENCOUNTER — Other Ambulatory Visit (HOSPITAL_COMMUNITY): Payer: Self-pay
